# Patient Record
Sex: Male | Born: 1950
Health system: Southern US, Community
[De-identification: ages and names within clinical notes are randomized; demographics above are authoritative.]

## PROBLEM LIST (undated history)

## (undated) DIAGNOSIS — G4730 Sleep apnea, unspecified: Secondary | ICD-10-CM

## (undated) DIAGNOSIS — I10 Essential (primary) hypertension: Secondary | ICD-10-CM

## (undated) DIAGNOSIS — L409 Psoriasis, unspecified: Secondary | ICD-10-CM

## (undated) DIAGNOSIS — E785 Hyperlipidemia, unspecified: Secondary | ICD-10-CM

## (undated) DIAGNOSIS — K219 Gastro-esophageal reflux disease without esophagitis: Secondary | ICD-10-CM

## (undated) DIAGNOSIS — E119 Type 2 diabetes mellitus without complications: Secondary | ICD-10-CM

## (undated) HISTORY — DX: Gastro-esophageal reflux disease without esophagitis: K21.9

## (undated) HISTORY — DX: Sleep apnea, unspecified: G47.30

## (undated) HISTORY — DX: Hyperlipidemia, unspecified: E78.5

## (undated) HISTORY — DX: Type 2 diabetes mellitus without complications: E11.9

## (undated) HISTORY — DX: Psoriasis, unspecified: L40.9

## (undated) HISTORY — DX: Essential (primary) hypertension: I10

## (undated) HISTORY — PX: TONSILLECTOMY AND ADENOIDECTOMY: SHX28

---

## 2011-12-10 ENCOUNTER — Other Ambulatory Visit: Payer: Self-pay | Admitting: Family Medicine

## 2013-09-23 ENCOUNTER — Ambulatory Visit (INDEPENDENT_AMBULATORY_CARE_PROVIDER_SITE_OTHER): Payer: No Typology Code available for payment source | Admitting: Emergency Medicine

## 2013-09-23 VITALS — BP 138/88 | HR 76 | Temp 98.1°F | Resp 19 | Ht 64.0 in | Wt 209.8 lb

## 2013-09-23 DIAGNOSIS — J111 Influenza due to unidentified influenza virus with other respiratory manifestations: Secondary | ICD-10-CM

## 2013-09-23 MED ORDER — OSELTAMIVIR PHOSPHATE 75 MG PO CAPS: 75.0000 mg | ORAL_CAPSULE | Freq: Two times a day (BID) | ORAL | Status: DC

## 2013-09-23 MED ORDER — PSEUDOEPHEDRINE-GUAIFENESIN ER 60-600 MG PO TB12: 1.0000 | ORAL_TABLET | Freq: Two times a day (BID) | ORAL | Status: DC

## 2013-09-23 MED ORDER — HYDROCOD POLST-CHLORPHEN POLST 10-8 MG/5ML PO LQCR: 5.0000 mL | Freq: Two times a day (BID) | ORAL | Status: DC | PRN

## 2013-09-23 NOTE — Patient Instructions (Signed)

## 2013-09-23 NOTE — Progress Notes (Signed)
[facility] [address] [place] [address] [phone]  Date:  09/23/2013   Name:  Aaron [name]   DOB:  1950-11-25   MRN:  [id number]  PCP:  No PCP Per Patient    Chief Complaint: Sore Throat   History of Present Illness:  ADARSH MUNDORF is a 63 y.o. very pleasant male patient who presents with the following:  Ill since two days ago with sudden onset sore throat, myalgias, malaise and fatigue.  Has  Mucoid nasal drainage and post nasal drip.  Cough is productive scant mucoid sputum.  No wheezing or shortness of breath.  No nausea or vomiting. Has a fevered feeling but thermometer.  No improvement with over the counter medications or other home remedies. Denies other complaint or health concern today.   There are no active problems to display for this patient.   History reviewed. No pertinent past medical history.  History reviewed. No pertinent past surgical history.  History  Substance Use Topics  . Smoking status: Former Research scientist (life sciences)  . Smokeless tobacco: Never Used  . Alcohol Use: Yes     Comment: 2-3 beers a week    Family History  Problem Relation Age of Onset  . Cancer Mother   . Cancer Father     Allergies  Allergen Reactions  . Erythromycin Itching    Medication list has been reviewed and updated.  Current Outpatient Prescriptions on File Prior to Visit  Medication Sig Dispense Refill  . ketoconazole (NIZORAL) 2 % cream USE TWICE DAILY FOR JOCK ITCH  30 g  0   No current facility-administered medications on file prior to visit.    Review of Systems:  As per HPI, otherwise negative.    Physical Examination: Filed Vitals:   09/23/13 1109  BP: 138/88  Pulse: 76  Temp: 98.1 F (36.7 C)  Resp: 19   Filed Vitals:   09/23/13 1109  Height: 5\' 4"  (1.626 m)  Weight: 209 lb 12.8 oz (95.165 kg)   Body mass index is 35.99 kg/(m^2). Ideal Body Weight: Weight in (lb) to have BMI = 25: 145.3  GEN: obese, NAD, Non-toxic, A &  O x 3 HEENT: Atraumatic, Normocephalic. Neck supple. No masses, No LAD. Ears and Nose: No external deformity. CV: RRR, No M/G/R. No JVD. No thrill. No extra heart sounds. PULM: CTA B, no wheezes, crackles, rhonchi. No retractions. No resp. distress. No accessory muscle use. ABD: S, NT, ND, +BS. No rebound. No HSM. EXTR: No c/c/e NEURO Normal gait.  PSYCH: Normally interactive. Conversant. Not depressed or anxious appearing.  Calm demeanor.    Assessment and Plan: Influenza tamiflu mucinex d tussionex  Signed,  Ellison Carwin, MD

## 2013-09-24 ENCOUNTER — Other Ambulatory Visit: Payer: Self-pay | Admitting: Physician Assistant

## 2013-09-27 MED ORDER — KETOCONAZOLE 2 % EX CREA: TOPICAL_CREAM | CUTANEOUS | Status: DC

## 2013-09-27 NOTE — Telephone Encounter (Signed)
Dr Ouida Sills, you just saw pt for another acute issue. Do you want to RF this med or pt need to RTC? Originally Rxd for [employer].

## 2013-09-30 ENCOUNTER — Ambulatory Visit (INDEPENDENT_AMBULATORY_CARE_PROVIDER_SITE_OTHER): Payer: No Typology Code available for payment source | Admitting: Family Medicine

## 2013-09-30 VITALS — BP 112/76 | HR 98 | Temp 97.8°F | Resp 18 | Ht 65.0 in | Wt 206.0 lb

## 2013-09-30 DIAGNOSIS — R059 Cough, unspecified: Secondary | ICD-10-CM

## 2013-09-30 DIAGNOSIS — R05 Cough: Secondary | ICD-10-CM

## 2013-09-30 DIAGNOSIS — J209 Acute bronchitis, unspecified: Secondary | ICD-10-CM

## 2013-09-30 DIAGNOSIS — J329 Chronic sinusitis, unspecified: Secondary | ICD-10-CM

## 2013-09-30 MED ORDER — HYDROCOD POLST-CHLORPHEN POLST 10-8 MG/5ML PO LQCR: 5.0000 mL | Freq: Two times a day (BID) | ORAL | Status: DC | PRN

## 2013-09-30 MED ORDER — LEVOFLOXACIN 500 MG PO TABS
500.0000 mg | ORAL_TABLET | Freq: Every day | ORAL | Status: DC
Start: 2013-09-30 — End: 2014-01-23

## 2013-09-30 NOTE — Progress Notes (Signed)
° °  Subjective:    Patient ID: Aaron [name], male    DOB: 1951-07-30, 63 y.o.   MRN: [id number]  HPI Chief Complaint  Patient presents with   Follow-up    flu, symptoms havent went away   This chart was scribed for [name], MD by Thea Alken, ED Scribe. This patient was seen in room 1 and the patient's care was started at 6:13 PM.  HPI Comments: Aaron [name] is a 63 y.o. male who [facility] for [facility] follow up. Pt reports that he was seen last week for influenza. He states that he feels like he is not getting better. He reports he still has a productive cough with little phlegm and that he feel his symptoms have moved to his sinuses. He states that he no longer has a sore throat. He reports that he finished the tamaflu and is still taking the  cough syrup with mild relief to his symptoms. Pt denies asthma. Pt reports that he has no other medical problems. Pt reports he is allergic to erythromycin.   Pt works at [employer].  History reviewed. No pertinent past medical history. Allergies  Allergen Reactions   Erythromycin Itching   Prior to Admission medications   Not on File    Review of Systems  HENT: Negative for sore throat.   Respiratory: Positive for cough.   Gastrointestinal: Negative for nausea and vomiting.       Objective:   Physical Exam  Nursing note and vitals reviewed. Constitutional: He is oriented to person, place, and time. He appears well-developed and well-nourished. No distress.  HENT:  Head: Normocephalic and atraumatic.  Eyes: EOM are normal.  Neck: Neck supple. No tracheal deviation present.  Cardiovascular: Normal rate.   Pulmonary/Chest: Effort normal. No respiratory distress.  Musculoskeletal: Normal range of motion.  Neurological: He is alert and oriented to person, place, and time.  Skin: Skin is warm and dry.  Psychiatric: He has a normal mood and affect. His behavior is normal.     Filed Vitals:   09/30/13 1748  BP: 112/76  Pulse: 98  Temp: 97.8 F (36.6 C)  Resp: 18  ronchi bilaterally, worse on right, partial clearing with cough     Assessment & Plan:   Acute bronchitis - Plan: chlorpheniramine-HYDROcodone (TUSSIONEX PENNKINETIC ER) 10-8 MG/5ML LQCR, levofloxacin (LEVAQUIN) 500 MG tablet  Cough - Plan: chlorpheniramine-HYDROcodone (TUSSIONEX PENNKINETIC ER) 10-8 MG/5ML LQCR, levofloxacin (LEVAQUIN) 500 MG tablet  Sinusitis - Plan: levofloxacin (LEVAQUIN) 500 MG tablet  Signed, [name], MD

## 2013-09-30 NOTE — Patient Instructions (Signed)
Sinusitis  Sinusitis is redness, soreness, and swelling (inflammation) of the paranasal sinuses. Paranasal sinuses are air pockets within the bones of your face (beneath the eyes, the middle of the forehead, or above the eyes). In healthy paranasal sinuses, mucus is able to drain out, and air is able to circulate through them by way of your nose. However, when your paranasal sinuses are inflamed, mucus and air can become trapped. This can allow bacteria and other germs to grow and cause infection.  Sinusitis can develop quickly and last only a short time (acute) or continue over a long period (chronic). Sinusitis that lasts for more than 12 weeks is considered chronic.   CAUSES   Causes of sinusitis include:   Allergies.   Structural abnormalities, such as displacement of the cartilage that separates your nostrils (deviated septum), which can decrease the air flow through your nose and sinuses and affect sinus drainage.   Functional abnormalities, such as when the small hairs (cilia) that line your sinuses and help remove mucus do not work properly or are not present.  SYMPTOMS   Symptoms of acute and chronic sinusitis are the same. The primary symptoms are pain and pressure around the affected sinuses. Other symptoms include:   Upper toothache.   Earache.   Headache.   Bad breath.   Decreased sense of smell and taste.   A cough, which worsens when you are lying flat.   Fatigue.   Fever.   Thick drainage from your nose, which often is green and may contain pus (purulent).   Swelling and warmth over the affected sinuses.  DIAGNOSIS   Your caregiver will perform a physical exam. During the exam, your caregiver may:   Look in your nose for signs of abnormal growths in your nostrils (nasal polyps).   Tap over the affected sinus to check for signs of infection.   View the inside of your sinuses (endoscopy) with a special imaging device with a light attached (endoscope), which is inserted into your  sinuses.  If your caregiver suspects that you have chronic sinusitis, one or more of the following tests may be recommended:   Allergy tests.   Nasal culture A sample of mucus is taken from your nose and sent to a lab and screened for bacteria.   Nasal cytology A sample of mucus is taken from your nose and examined by your caregiver to determine if your sinusitis is related to an allergy.  TREATMENT   Most cases of acute sinusitis are related to a viral infection and will resolve on their own within 10 days. Sometimes medicines are prescribed to help relieve symptoms (pain medicine, decongestants, nasal steroid sprays, or saline sprays).   However, for sinusitis related to a bacterial infection, your caregiver will prescribe antibiotic medicines. These are medicines that will help kill the bacteria causing the infection.   Rarely, sinusitis is caused by a fungal infection. In theses cases, your caregiver will prescribe antifungal medicine.  For some cases of chronic sinusitis, surgery is needed. Generally, these are cases in which sinusitis recurs more than 3 times per year, despite other treatments.  HOME CARE INSTRUCTIONS    Drink plenty of water. Water helps thin the mucus so your sinuses can drain more easily.   Use a humidifier.   Inhale steam 3 to 4 times a day (for example, sit in the bathroom with the shower running).   Apply a warm, moist washcloth to your face 3 to 4 times a day,   or as directed by your caregiver.   Use saline nasal sprays to help moisten and clean your sinuses.   Take over-the-counter or prescription medicines for pain, discomfort, or fever only as directed by your caregiver.  SEEK IMMEDIATE MEDICAL CARE IF:   You have increasing pain or severe headaches.   You have nausea, vomiting, or drowsiness.   You have swelling around your face.   You have vision problems.   You have a stiff neck.   You have difficulty breathing.  MAKE SURE YOU:    Understand these  instructions.   Will watch your condition.   Will get help right away if you are not doing well or get worse.  Document Released: 08/17/2005 Document Revised: 11/09/2011 Document Reviewed: 09/01/2011  ExitCare Patient Information 2014 [employer]          Bronchitis  Bronchitis is inflammation of the airways that extend from the windpipe into the lungs (bronchi). The inflammation often causes mucus to develop, which leads to a cough. If the inflammation becomes severe, it may cause shortness of breath.  CAUSES   Bronchitis may be caused by:    Viral infections.    Bacteria.    Cigarette smoke.    Allergens, pollutants, and other irritants.   SIGNS AND SYMPTOMS   The most common symptom of bronchitis is a frequent cough that produces mucus. Other symptoms include:   Fever.    Body aches.    Chest congestion.    Chills.    Shortness of breath.    Sore throat.   DIAGNOSIS   Bronchitis is usually diagnosed through a medical history and physical exam. Tests, such as chest X-rays, are sometimes done to rule out other conditions.   TREATMENT   You may need to avoid contact with whatever caused the problem (smoking, for example). Medicines are sometimes needed. These may include:   Antibiotics. These may be prescribed if the condition is caused by bacteria.   Cough suppressants. These may be prescribed for relief of cough symptoms.    Inhaled medicines. These may be prescribed to help open your airways and make it easier for you to breathe.    Steroid medicines. These may be prescribed for those with recurrent (chronic) bronchitis.  HOME CARE INSTRUCTIONS   Get plenty of rest.    Drink enough fluids to keep your urine clear or pale yellow (unless you have a medical condition that requires fluid restriction). Increasing fluids may help thin your secretions and will prevent dehydration.    Only take over-the-counter or prescription medicines as directed by your health care  provider.   Only take antibiotics as directed. Make sure you finish them even if you start to feel better.   Avoid secondhand smoke, irritating chemicals, and strong fumes. These will make bronchitis worse. If you are a smoker, quit smoking. Consider using nicotine gum or skin patches to help control withdrawal symptoms. Quitting smoking will help your lungs heal faster.    Put a cool-mist humidifier in your bedroom at night to moisten the air. This may help loosen mucus. Change the water in the humidifier daily. You can also run the hot water in your shower and sit in the bathroom with the door closed for 5 10 minutes.    Follow up with your health care provider as directed.    Wash your hands frequently to avoid catching bronchitis again or spreading an infection to others.   SEEK MEDICAL CARE IF:  Your symptoms   do not improve after 1 week of treatment.   SEEK IMMEDIATE MEDICAL CARE IF:   Your fever increases.   You have chills.    You have chest pain.    You have worsening shortness of breath.    You have bloody sputum.   You faint.   You have lightheadedness.   You have a severe headache.    You vomit repeatedly.  MAKE SURE YOU:    Understand these instructions.   Will watch your condition.   Will get help right away if you are not doing well or get worse.  Document Released: 08/17/2005 Document Revised: 06/07/2013 Document Reviewed: 04/11/2013  ExitCare Patient Information 2014 [employer].

## 2013-10-22 ENCOUNTER — Other Ambulatory Visit: Payer: Self-pay | Admitting: Family Medicine

## 2013-10-22 DIAGNOSIS — R05 Cough: Secondary | ICD-10-CM

## 2013-10-22 DIAGNOSIS — J209 Acute bronchitis, unspecified: Secondary | ICD-10-CM

## 2013-10-22 DIAGNOSIS — J329 Chronic sinusitis, unspecified: Secondary | ICD-10-CM

## 2013-10-22 DIAGNOSIS — R059 Cough, unspecified: Secondary | ICD-10-CM

## 2014-01-23 ENCOUNTER — Ambulatory Visit (INDEPENDENT_AMBULATORY_CARE_PROVIDER_SITE_OTHER): Payer: No Typology Code available for payment source | Admitting: Emergency Medicine

## 2014-01-23 VITALS — BP 122/73 | HR 63 | Temp 98.2°F | Resp 18 | Ht 66.5 in | Wt 210.2 lb

## 2014-01-23 DIAGNOSIS — R300 Dysuria: Secondary | ICD-10-CM

## 2014-01-23 LAB — POCT URINALYSIS DIPSTICK
Bilirubin, UA: NEGATIVE
Glucose, UA: 100
Ketones, UA: 15
Leukocytes, UA: NEGATIVE
Nitrite, UA: NEGATIVE
Protein, UA: NEGATIVE
Spec Grav, UA: >=1.03
Urobilinogen, UA: 0.2
pH, UA: 5.5

## 2014-01-23 LAB — POCT UA - MICROSCOPIC ONLY
Bacteria, U Microscopic: NEGATIVE
Casts, Ur, LPF, POC: NEGATIVE
Crystals, Ur, HPF, POC: NEGATIVE
Mucus, UA: NEGATIVE
WBC, Ur, HPF, POC: NEGATIVE
Yeast, UA: NEGATIVE

## 2014-01-23 MED ORDER — CIPROFLOXACIN HCL 500 MG PO TABS: 500.0000 mg | ORAL_TABLET | Freq: Two times a day (BID) | ORAL | Status: DC

## 2014-01-23 NOTE — Progress Notes (Addendum)
   Subjective:    Patient ID: Aaron [name], male    DOB: 1951-03-13, 63 y.o.   MRN: [id number]  HPI 63 yo male with complaint of dysuria for one day with mild pinkish discharge as well.  Patient denies abdominal or back pain.  No fever or chills.    PPMH:  BPH  SH:  Former smoker, occasional alcohol   Review of Systems  Constitutional: Negative for fever and chills.  Gastrointestinal: Negative for nausea, vomiting, abdominal pain, diarrhea and constipation.  Genitourinary: Positive for dysuria and urgency. Negative for flank pain, discharge and penile pain.       Objective:   Physical Exam Blood pressure 122/73, pulse 63, temperature 98.2 F (36.8 C), temperature source Oral, resp. rate 18, height 5' 6.5" (1.689 m), weight 210 lb 3.2 oz (95.346 kg), SpO2 95.00%. Body mass index is 33.42 kg/(m^2). Well-developed, well nourished male who is awake, alert and oriented, in NAD. HEENT: [place]/AT, PERRL, EOMI.  Sclera and conjunctiva are clear.   Lungs: normal effort Abdomen:  supple, non-tender, no mass or organomegaly. Back:  nontender with no CVA tenderness. Extremities: no cyanosis, clubbing or edema. Skin: warm and dry without rash. Psychologic: good mood and appropriate affect, normal speech and behavior.  Results for orders placed in visit on 01/23/14  POCT URINALYSIS DIPSTICK      Result Value Ref Range   Color, UA yellow     Clarity, UA clear     Glucose, UA 100     Bilirubin, UA neg     Ketones, UA 15     Spec Grav, UA >=1.030     Blood, UA trace     pH, UA 5.5     Protein, UA neg     Urobilinogen, UA 0.2     Nitrite, UA neg     Leukocytes, UA Negative    POCT UA - MICROSCOPIC ONLY      Result Value Ref Range   WBC, Ur, HPF, POC neg     RBC, urine, microscopic 0-1     Bacteria, U Microscopic neg     Mucus, UA neg     Epithelial cells, urine per micros 0-1     Crystals, Ur, HPF, POC neg     Casts, Ur, LPF, POC neg     Yeast, UA neg          Assessment &  Plan:  Dysuria - will treat with cipro x 5 days.   Cult pending-agree with above Robert P. Laney Pastor, M.D.

## 2014-01-25 LAB — URINE CULTURE: Colony Count: 50000

## 2014-03-04 ENCOUNTER — Telehealth: Payer: Self-pay

## 2014-03-04 ENCOUNTER — Other Ambulatory Visit: Payer: Self-pay | Admitting: Emergency Medicine

## 2014-03-04 NOTE — Telephone Encounter (Signed)
Patient was seen last month for blood in urine. States that the problem has come back and wants to know if he can have another script for Ciprofloxacin. [facility] and [facility]  [phone]

## 2014-03-04 NOTE — Telephone Encounter (Signed)
Advised wife that pt should RTC. Pt's wife was agreeable. Pt will come in the morning.

## 2014-03-05 ENCOUNTER — Ambulatory Visit (INDEPENDENT_AMBULATORY_CARE_PROVIDER_SITE_OTHER): Payer: No Typology Code available for payment source | Admitting: Emergency Medicine

## 2014-03-05 VITALS — BP 132/84 | HR 79 | Temp 98.8°F | Resp 16

## 2014-03-05 DIAGNOSIS — E119 Type 2 diabetes mellitus without complications: Secondary | ICD-10-CM

## 2014-03-05 DIAGNOSIS — R319 Hematuria, unspecified: Secondary | ICD-10-CM

## 2014-03-05 LAB — COMPREHENSIVE METABOLIC PANEL
ALT: 26 U/L (ref 0–53)
AST: 17 U/L (ref 0–37)
Albumin: 4.1 g/dL (ref 3.5–5.2)
Alkaline Phosphatase: 91 U/L (ref 39–117)
BUN: 12 mg/dL (ref 6–23)
CO2: 26 mEq/L (ref 19–32)
Calcium: 9.1 mg/dL (ref 8.4–10.5)
Chloride: 101 mEq/L (ref 96–112)
Creat: 0.84 mg/dL (ref 0.50–1.35)
Glucose, Bld: 234 mg/dL — ABNORMAL HIGH (ref 70–99)
Potassium: 4.4 mEq/L (ref 3.5–5.3)
Sodium: 137 mEq/L (ref 135–145)
Total Bilirubin: 0.4 mg/dL (ref 0.2–1.2)
Total Protein: 6.9 g/dL (ref 6.0–8.3)

## 2014-03-05 LAB — POCT URINALYSIS DIPSTICK
Bilirubin, UA: NEGATIVE
Glucose, UA: NEGATIVE
Ketones, UA: NEGATIVE
Nitrite, UA: NEGATIVE
Protein, UA: NEGATIVE
Spec Grav, UA: 1.01
Urobilinogen, UA: 0.2
pH, UA: 5

## 2014-03-05 LAB — POCT UA - MICROSCOPIC ONLY
Bacteria, U Microscopic: NEGATIVE
Casts, Ur, LPF, POC: NEGATIVE
Crystals, Ur, HPF, POC: NEGATIVE
Mucus, UA: NEGATIVE
Yeast, UA: NEGATIVE

## 2014-03-05 LAB — LIPID PANEL
Cholesterol: 191 mg/dL (ref 0–200)
HDL: 37 mg/dL — ABNORMAL LOW (ref >39)
LDL Cholesterol: 117 mg/dL — ABNORMAL HIGH (ref 0–99)
Total CHOL/HDL Ratio: 5.2 Ratio
Triglycerides: 184 mg/dL — ABNORMAL HIGH (ref <150)
VLDL: 37 mg/dL (ref 0–40)

## 2014-03-05 LAB — CBC
HCT: 47.7 % (ref 39.0–52.0)
Hemoglobin: 16.5 g/dL (ref 13.0–17.0)
MCH: 27.5 pg (ref 26.0–34.0)
MCHC: 34.6 g/dL (ref 30.0–36.0)
MCV: 79.4 fL (ref 78.0–100.0)
Platelets: 235 10*3/uL (ref 150–400)
RBC: 6.01 MIL/uL — ABNORMAL HIGH (ref 4.22–5.81)
RDW: 14 % (ref 11.5–15.5)
WBC: 8.1 10*3/uL (ref 4.0–10.5)

## 2014-03-05 LAB — GLUCOSE, POCT (MANUAL RESULT ENTRY): POC Glucose: 242 mg/dl — AB (ref 70–99)

## 2014-03-05 LAB — POCT GLYCOSYLATED HEMOGLOBIN (HGB A1C): Hemoglobin A1C: 8.2

## 2014-03-05 MED ORDER — GLUCOSE BLOOD VI STRP: ORAL_STRIP | Status: DC

## 2014-03-05 MED ORDER — LISINOPRIL 10 MG PO TABS: 10.0000 mg | ORAL_TABLET | Freq: Every day | ORAL | Status: DC

## 2014-03-05 MED ORDER — METFORMIN HCL ER 500 MG PO TB24: ORAL_TABLET | ORAL | Status: DC

## 2014-03-05 MED ORDER — FREESTYLE SYSTEM KIT: 1.0000 | PACK | Status: DC | PRN

## 2014-03-05 NOTE — Patient Instructions (Signed)
Diabetes and Small Vessel Disease Small vessel disease (microvascular disease) includes nephropathy, retinopathy, and neuropathy. People with diabetes are at risk for these problems, but keeping blood glucose (sugar) controlled is helpful in preventing problems. DIABETIC KIDNEY PROBLEMS (DIABETIC NEPHROPATHY)  Diabetic nephropathy occurs in many patients with diabetes.  Damage to the small vessels in the kidneys is the leading cause of end-stage renal disease (ESRD).  Protein in the urine (albuminuria) in the range of 30 to 300 mg/24 h (microalbuminuria) is a sign of the earliest stage of diabetic nephropathy.  Good blood glucose (sugar) and blood pressure control significantly reduce the progression of nephropathy. DIABETIC EYE PROBLEMS (DIABETIC RETINOPATHY)  Diabetic retinopathy is the most common cause of new cases of blindness in adults. It is related to the number of years you have had diabetes.  Common risk factors include high blood sugar (hyperglycemia), high blood pressure (hypertension), and poorly controlled blood lipids such as high blood cholesterol (hypercholesterolemia). DIABETIC NERVE PROBLEMS (DIABETIC NEUROPATHY) Diabetic neuropathy is the most common, long-term complication of diabetes. It is responsible for more than half of leg amputations not due to accidents. The main risk for developing diabetic neuropathy seems to be uncontrolled blood sugars. Hyperglycemia damages the nerve fibers causing sensation (feeling) problems. The closer you can keep the following guidelines, the better chance you will have avoiding problems from small vessel disease.  Working toward near normal blood glucose or as normal as possible. You will need to keep your blood glucose and A1c at the target range prescribed by your caregiver.  Keep your blood pressure less than 120/80.  Keep your low-density lipoprotein (LDL) cholesterol (one of the fats in your blood) at less than 100 mg/dL. An LDL  less than 70 mg/dL may be recommended for high risk patients. You cannot change your family history, but it is important to change the risk factors that you can. Risk factors you can control include:  Controlling high blood pressure.  Stopping smoking.  Using alcohol only in moderation. Generally, this means about one drink per day for women and two drinks per day for men.  Controlling your blood lipids (cholesterol and triglycerides).  Treating heart problems, if these are contributing to risk. SEEK MEDICAL CARE IF:   You are having problems keeping your blood glucose in goal range.  You notice a change in your vision or new problems with your vision.  You have wound or sore that does not heal.  Your blood pressure is above the target range. Document Released: 08/20/2003 Document Revised: 08/03/2012 Document Reviewed: 01/25/2009 [facility] Patient Information 2015 [place], [place]. This information is not intended to replace advice given to you by your health care provider. Make sure you discuss any questions you have with your health care provider. How to Increase Fiber in the Meal Plan for Diabetes Increasing fiber in the diet has many benefits including lowering blood cholesterol, helping to control blood glucose (sugar), preventing constipation, and aiding in weight management by helping you feel full longer. Start adding fiber to your diet slowly. A gradual substitution of high-fiber foods for low-fiber foods will allow the digestive tract to adjust. Most men under 90 years of age should aim to eat 38 g of fiber a day. Women should aim for 25 g. Over 54 years of age, most men need 30 g of fiber and most women need 21 g. Below are some suggestions for increasing fiber.  Try whole-wheat bread instead of white bread. Look for words high on the list  of ingredients, such as whole wheat, whole rye, or whole oats.  Try a baked potato with skin instead of mashed potatoes.  Try a fresh apple  with skin instead of applesauce.  Try a fresh orange instead of orange juice.  Try popcorn instead of potato chips.  Try bran cereal instead of corn flakes.  Try kidney, whole pinto, or garbanzo beans instead of bread.  Try whole-grain crackers instead of saltine crackers.  Try whole-wheat pasta instead of regular varieties. While on a high-fiber diet:   Drink enough water and fluids to keep your urine clear or pale yellow.  Eat a variety of high fiber foods such as fruits, vegetables, whole grains, nuts, and seeds.  Aim for 5 servings of fruit and vegetables per day.  Try to increase your intake of fiber by eating high-fiber foods instead of taking fiber supplements that contain small amounts of fiber. There can be additional benefits for long-term health and blood glucose control with high-fiber foods.  SOURCES OF FIBER The following list shows the average dietary fiber for types of food in the various food groups. Starches and Breads / Dietary Fiber (g)  Whole-grain bread, 1 slice / 2 g  Whole-grain cereals,  cup / 3 g  Bran cereals,  to  cup / 8 g  Starchy vegetables,  cup / 3 g  Oatmeal,  cup / 2 g  Whole-wheat pasta,  cup / 2 g  Brown rice,  cup / 2 g  Barley,  cup / 3 g Legumes / Dietary Fiber (g)  Beans,  cup / 8 g  Peas,  cup / 8 g  Lentils ,  cup / 8 g Meat and Meat Substitutes / Dietary Fiber (g) This group averages 0 grams of fiber. Exceptions are:  Nuts, seeds, 1 oz or  cup / 3 g  Chunky peanut butter, 2 tbs / 3 g Vegetables / Dietary Fiber (g)  Cooked vegetables,  to  cup / 2 to 3 g  Raw vegetables, 1 to 2 cups / 2 to 3 g Fruit / Dietary Fiber (g)  Raw or cooked fruit,  cup or 1 small, fresh piece / 2 g Milk / Dietary Fiber (g)  Milk, 1 cup or 8 oz / 0 g Fats and Oils / Dietary Fiber (g)  Fats and oils, 1 tsp / 0 g You can determine how much fiber you are eating by reading the Nutrition Facts panel on the labels of the  foods you eat. FIBER IN SPECIFIC FOODS Cereals / Dietary Fiber (g)  All Bran,  cup / 9 g  All Bran with Extra Fiber,  cup / 13 g  Bran Flakes,  cup / 4 g  Cheerios,  cup / 1.5 g  Corn Bran,  cup / 4 g  Corn Flakes,  cup / 0.75 g  Cracklin' Oat Bran,  cup / 4 g  Fiber One,  cup / 13 g  Grape Nuts, 3 tbs / 3 g  Grape Nuts Flakes,  cup / 3 g  Noodles,  cup, cooked / 0.5 g  Nutrigrain Wheat,  cup / 3.5 g  Oatmeal,  cup, cooked / 1.1 g  Pasta, white (macaroni, spaghetti),  cup, cooked / 0.5 g  Pasta, whole-wheat (macaroni, spaghetti),  cup, cooked / 2 g  [place],  cup, cooked / 3 g  Rice, wild,  cup, cooked / 0.5 g  Rice, brown,  cup, cooked / 1 g  Rice, white,  cup,  cooked / 0.2 g  Shredded Wheat, bite-sized,  cup / 2 g  Total,  cup / 1.75 g  Wheat Chex,  cup / 2.5 g  [place]heatena,  cup, cooked / 4 g  Wheaties,  cup / 2.75 g Bread, Starchy Vegetables, and Dried Peas and Beans / Dietary Fiber (g)  Bagel, whole / 0.6 g  Baked beans in tomato sauce,  cup, cooked / 3 g  Bran muffin, 1 small / 2.5 g  Bread, cracked wheat, 1 slice / 2.5 g  Bread, pumpernickel, 1 slice / 2.5 g  Bread, white, 1 slice / 0.4 g  Bread, whole-wheat, 1 slice / 1.4 g  Corn,  cup, canned / 2.9 g  Kidney beans,  cup, cooked / 3.5 g  Lentils, cup, cooked / 3 g  Lima beans,  cup, cooked / 4 g  Navy beans,  cup, cooked / 4 g  Peas,  cup, cooked / 4 g  Popcorn, 3 cups popped, unbuttered / 3.5 g  Potato, baked (with skin), 1 small / 4 g  Potato, baked (without skin), 1 small / 2 g  Ry-Krisp, 4 crackers / 3 g  Saltine crackers, 6 squares / 0 g  Split peas,  cup, cooked / 2.5 g  Yams (sweet potato),  cup / 1.7 g Fruit / Dietary Fiber (g)  Apple, 1 small, fresh, with skin / 4 g  Apple juice,  cup / 0.4 g  Apricots, 4 medium, fresh / 4 g  Apricots, 7 halves, dried / 2 g  Banana,  medium / 1.2 g  Blueberries,  cup / 2  g  Cantaloupe, melon / 1.3 g  Cherries,  cup, canned / 1.4 g  Grapefruit,  medium / 1.6 g  Grapes, 15 small / 1.2 g  Grape juice,  cup / 0.5 g  Orange, 1 medium, fresh / 2 g  Orange juice,  cup / 0.5 g  Peach, 1 medium,fresh, with skin / 2 g  Pear, 1 medium, fresh, with skin / 4 g  Pineapple, cup, canned / 0.7 g  Plums, 2 whole / 2 g  Prunes, 3 whole / 1.5 g  Raspberries, 1 cup / 6 g  Strawberries, 1  cup / 4 g  Watermelon, 1  cup / 0.5 g Vegetables / Dietary Fiber (g)  Asparagus,  cup, cooked / 1 g  Beans, green and wax,  cup, cooked / 1.6 g  Beets,  cup, cooked / 1.8 g  Broccoli,  cup, cooked / 2.2 g  Brussels sprouts,  cup, cooked / 4 g  Cabbage,  cup, cooked / 2.5 g  Carrots,  cup, cooked / 2.3 g  Cauliflower,  cup, cooked / 1.1 g  Celery, 1 cup, raw / 1.5 g  Cucumber, 1 cup, raw / 0.8 g  Green pepper,  cup sliced, cooked / 1.5 g  Lettuce, 1 cup, sliced / 0.9 g  Mushrooms, 1 cup sliced, raw / 1.8 g  Onion, 1 cup sliced, raw / 1.6 g  Spinach,  cup, cooked / 2.4 g  Tomato, 1 medium, fresh / 1.5 g  Tomato juice,  cup / 0 g  Zucchini,  cup, cooked / 1.8 g Document Released: 02/06/2002 Document Revised: 12/12/2012 Document Reviewed: 03/05/2009 [employer] Patient Information 2015 [place], [place]. This information is not intended to replace advice given to you by your health care provider. Make sure you discuss any questions you have with your health care provider. Type 2 Diabetes Mellitus,  Adult Type 2 diabetes mellitus, often simply referred to as type 2 diabetes, is a long-lasting (chronic) disease. In type 2 diabetes, the pancreas does not make enough insulin (a hormone), the cells are less responsive to the insulin that is made (insulin resistance), or both. Normally, insulin moves sugars from food into the tissue cells. The tissue cells use the sugars for energy. The lack of insulin or the lack of normal response to insulin  causes excess sugars to build up in the blood instead of going into the tissue cells. As a result, high blood sugar (hyperglycemia) develops. The effect of high sugar (glucose) levels can cause many complications. Type 2 diabetes was also previously called adult-onset diabetes but it can occur at any age.  RISK FACTORS  A person is predisposed to developing type 2 diabetes if someone in the family has the disease and also has one or more of the following primary risk factors:  Overweight.  An inactive lifestyle.  A history of consistently eating high-calorie foods. Maintaining a normal weight and regular physical activity can reduce the chance of developing type 2 diabetes. SYMPTOMS  A person with type 2 diabetes may not show symptoms initially. The symptoms of type 2 diabetes appear slowly. The symptoms include:  Increased thirst (polydipsia).  Increased urination (polyuria).  Increased urination during the night (nocturia).  Weight loss. This weight loss may be rapid.  Frequent, recurring infections.  Tiredness (fatigue).  Weakness.  Vision changes, such as blurred vision.  Fruity smell to your breath.  Abdominal pain.  Nausea or vomiting.  Cuts or bruises which are slow to heal.  Tingling or numbness in the hands or feet. DIAGNOSIS Type 2 diabetes is frequently not diagnosed until complications of diabetes are present. Type 2 diabetes is diagnosed when symptoms or complications are present and when blood glucose levels are increased. Your blood glucose level may be checked by one or more of the following blood tests:  A fasting blood glucose test. You will not be allowed to eat for at least 8 hours before a blood sample is taken.  A random blood glucose test. Your blood glucose is checked at any time of the day regardless of when you ate.  A hemoglobin A1c blood glucose test. A hemoglobin A1c test provides information about blood glucose control over the previous 3  months.  An oral glucose tolerance test (OGTT). Your blood glucose is measured after you have not eaten (fasted) for 2 hours and then after you drink a glucose-containing beverage. TREATMENT   You may need to take insulin or diabetes medicine daily to keep blood glucose levels in the desired range.  If you use insulin, you may need to adjust the dosage depending on the carbohydrates that you eat with each meal or snack. The treatment goal is to maintain the before meal blood sugar (preprandial glucose) level at 70-130 mg/dL. HOME CARE INSTRUCTIONS   Have your hemoglobin A1c level checked twice a year.  Perform daily blood glucose monitoring as directed by your health care provider.  Monitor urine ketones when you are ill and as directed by your health care provider.  Take your diabetes medicine or insulin as directed by your health care provider to maintain your blood glucose levels in the desired range.  Never run out of diabetes medicine or insulin. It is needed every day.  If you are using insulin, you may need to adjust the amount of insulin given based on your intake of carbohydrates. Carbohydrates  can raise blood glucose levels but need to be included in your diet. Carbohydrates provide vitamins, minerals, and fiber which are an essential part of a healthy diet. Carbohydrates are found in fruits, vegetables, whole grains, dairy products, legumes, and foods containing added sugars.  Eat healthy foods. You should make an appointment to see a registered dietitian to help you create an eating plan that is right for you.  Lose weight if overweight.  Carry a medical alert card or wear your medical alert jewelry.  Carry a 15 gram carbohydrate snack with you at all times to treat low blood glucose (hypoglycemia). Some examples of 15 gram carbohydrate snacks include:  Glucose tablets, 3 or 4  Raisins, 2 tablespoons (24 grams)  Jelly beans, 6  Animal crackers, 8  Regular pop, 4  ounces (120 mL)  Gummy treats, 9  Recognize hypoglycemia. Hypoglycemia occurs with blood glucose levels of 70 mg/dL and below. The risk for hypoglycemia increases when fasting or skipping meals, during or after intense exercise, and during sleep. Hypoglycemia symptoms can include:  Tremors or shakes.  Decreased ability to concentrate.  Sweating.  Increased heart rate.  Headache.  Dry mouth.  Hunger.  Irritability.  Anxiety.  Restless sleep.  Altered speech or coordination.  Confusion.  Treat hypoglycemia promptly. If you are alert and able to safely swallow, follow the 15:15 rule:  Take 15-20 grams of rapid-acting glucose or carbohydrate. Rapid-acting options include glucose gel, glucose tablets, or 4 ounces (120 mL) of fruit juice, regular soda, or low fat milk.  Check your blood glucose level 15 minutes after taking the glucose.  Take 15-20 grams more of glucose if the repeat blood glucose level is still 70 mg/dL or below.  Eat a meal or snack within 1 hour once blood glucose levels return to normal.  Be alert to feeling very thirsty and urinating more frequently than usual, which are early signs of hyperglycemia. An early awareness of hyperglycemia allows for prompt treatment. Treat hyperglycemia as directed by your health care provider.  Engage in at least 150 minutes of moderate-intensity physical activity a week, spread over at least 3 days of the week or as directed by your health care provider. In addition, you should engage in resistance exercise at least 2 times a week or as directed by your health care provider.  Adjust your medicine and food intake as needed if you start a new exercise or sport.  Follow your sick day plan at any time you are unable to eat or drink as usual.  Avoid tobacco use.  Limit alcohol intake to no more than 1 drink per day for nonpregnant women and 2 drinks per day for men. You should drink alcohol only when you are also eating  food. Talk with your health care provider whether alcohol is safe for you. Tell your health care provider if you drink alcohol several times a week.  Follow up with your health care provider regularly.  Schedule an eye exam soon after the diagnosis of type 2 diabetes and then annually.  Perform daily skin and foot care. Examine your skin and feet daily for cuts, bruises, redness, nail problems, bleeding, blisters, or sores. A foot exam by a health care provider should be done annually.  Brush your teeth and gums at least twice a day and floss at least once a day. Follow up with your dentist regularly.  Share your diabetes management plan with your workplace or school.  Stay up-to-date with immunizations.  Learn  to manage stress.  Obtain ongoing diabetes education and support as needed.  Participate in, or seek rehabilitation as needed to maintain or improve independence and quality of life. Request a physical or occupational therapy referral if you are having foot or hand numbness or difficulties with grooming, dressing, eating, or physical activity. SEEK MEDICAL CARE IF:   You are unable to eat food or drink fluids for more than 6 hours.  You have nausea and vomiting for more than 6 hours.  Your blood glucose level is over 240 mg/dL.  There is a change in mental status.  You develop an additional serious illness.  You have diarrhea for more than 6 hours.  You have been sick or have had a fever for a couple of days and are not getting better.  You have pain during any physical activity.  SEEK IMMEDIATE MEDICAL CARE IF:  You have difficulty breathing.  You have moderate to large ketone levels. MAKE SURE YOU:  Understand these instructions.  Will watch your condition.  Will get help right away if you are not doing well or get worse. Document Released: 08/17/2005 Document Revised: 08/22/2013 Document Reviewed: 03/15/2012 [facility] Patient Information 2015 [place],  [place]. This information is not intended to replace advice given to you by your health care provider. Make sure you discuss any questions you have with your health care provider.

## 2014-03-05 NOTE — Progress Notes (Signed)
[facility] [address] [place] [address] [phone]  Date:  03/05/2014   Name:  Aaron [name]   DOB:  Feb 09, 1951   MRN:  [id number]  PCP:  No PCP Per Patient    Chief Complaint: Medication Refill for a follow up   History of Present Illness:  Aaron [name] is a 63 y.o. very pleasant male patient who presents with the following:  Seen last month for dysuria and hematuria.  Treated with cipro but culture came back negative.  He again has dysuria and hematuria.  Denies frequency or urgency.  No nausea  Or vomiting.  No stool change, no fever or chills.  No discharge.  No improvement with over the counter medications or other home remedies.      There are no active problems to display for this patient.   History reviewed. No pertinent past medical history.  History reviewed. No pertinent past surgical history.  History  Substance Use Topics  . Smoking status: Former Research scientist (life sciences)  . Smokeless tobacco: Never Used  . Alcohol Use: Yes     Comment: 2-3 beers a week    Family History  Problem Relation Age of Onset  . Cancer Mother   . Cancer Father     Allergies  Allergen Reactions  . Erythromycin Itching    Medication list has been reviewed and updated.  Current Outpatient Prescriptions on File Prior to Visit  Medication Sig Dispense Refill  . ciprofloxacin (CIPRO) 500 MG tablet Take 1 tablet (500 mg total) by mouth 2 (two) times daily.  10 tablet  0   No current facility-administered medications on file prior to visit.    Review of Systems:  As per HPI, otherwise negative.    Physical Examination: Filed Vitals:   03/05/14 0843  BP: 132/84  Pulse: 79  Temp: 98.8 F (37.1 C)  Resp: 16   There were no vitals filed for this visit. There is no weight on file to calculate BMI. Ideal Body Weight:    GEN: WDWN, NAD, Non-toxic, A & O x 3 HEENT: Atraumatic, Normocephalic. Neck supple. No masses, No LAD. Ears and Nose: No external  deformity. CV: RRR, No M/G/R. No JVD. No thrill. No extra heart sounds. PULM: CTA B, no wheezes, crackles, rhonchi. No retractions. No resp. distress. No accessory muscle use. ABD: S, NT, ND, +BS. No rebound. No HSM. EXTR: No c/c/e NEURO Normal gait.  PSYCH: Normally interactive. Conversant. Not depressed or anxious appearing.  Calm demeanor.    Assessment and Plan: New onset NIDDM Metformin Follow up in one month  Signed,  Ellison Carwin, MD   Results for orders placed in visit on 03/05/14  POCT URINALYSIS DIPSTICK      Result Value Ref Range   Color, UA yellow     Clarity, UA clear     Glucose, UA neg     Bilirubin, UA neg     Ketones, UA neg     Spec Grav, UA 1.010     Blood, UA trace-intact     pH, UA 5.0     Protein, UA neg     Urobilinogen, UA 0.2     Nitrite, UA neg     Leukocytes, UA Trace    POCT UA - MICROSCOPIC ONLY      Result Value Ref Range   WBC, Ur, HPF, POC 0-1     RBC, urine, microscopic 0-1     Bacteria, U Microscopic neg  Mucus, UA neg     Epithelial cells, urine per micros 0-3     Crystals, Ur, HPF, POC neg     Casts, Ur, LPF, POC neg     Yeast, UA neg    POCT GLYCOSYLATED HEMOGLOBIN (HGB A1C)      Result Value Ref Range   Hemoglobin A1C 8.2    GLUCOSE, POCT (MANUAL RESULT ENTRY)      Result Value Ref Range   POC Glucose 242 (*) 70 - 99 mg/dl

## 2014-03-06 LAB — PSA: PSA: 1.75 ng/mL (ref <=4.00)

## 2014-03-06 LAB — GC/CHLAMYDIA PROBE AMP
CT Probe RNA: NEGATIVE
GC Probe RNA: NEGATIVE

## 2014-03-06 NOTE — Progress Notes (Signed)
Spoke with patients wife and she is going to have her husband call to schedule an appointment with Dr. Marin Comment on august 7 or august 14

## 2014-04-06 ENCOUNTER — Ambulatory Visit (INDEPENDENT_AMBULATORY_CARE_PROVIDER_SITE_OTHER): Payer: No Typology Code available for payment source | Admitting: Family Medicine

## 2014-04-06 ENCOUNTER — Encounter: Payer: Self-pay | Admitting: Family Medicine

## 2014-04-06 VITALS — BP 118/76 | HR 76 | Temp 98.2°F | Resp 16 | Ht 65.0 in | Wt 198.6 lb

## 2014-04-06 DIAGNOSIS — Z23 Encounter for immunization: Secondary | ICD-10-CM

## 2014-04-06 DIAGNOSIS — Z79899 Other long term (current) drug therapy: Secondary | ICD-10-CM

## 2014-04-06 DIAGNOSIS — E785 Hyperlipidemia, unspecified: Secondary | ICD-10-CM

## 2014-04-06 DIAGNOSIS — E119 Type 2 diabetes mellitus without complications: Secondary | ICD-10-CM

## 2014-04-06 DIAGNOSIS — L409 Psoriasis, unspecified: Secondary | ICD-10-CM

## 2014-04-06 DIAGNOSIS — L408 Other psoriasis: Secondary | ICD-10-CM

## 2014-04-06 LAB — MICROALBUMIN / CREATININE URINE RATIO
Creatinine, Urine: 181.1 mg/dL
Microalb Creat Ratio: 4.9 mg/g (ref 0.0–30.0)
Microalb, Ur: 0.88 mg/dL (ref 0.00–1.89)

## 2014-04-06 LAB — GLUCOSE, POCT (MANUAL RESULT ENTRY): POC Glucose: 126 mg/dl — AB (ref 70–99)

## 2014-04-06 LAB — BASIC METABOLIC PANEL
BUN: 13 mg/dL (ref 6–23)
CO2: 24 mEq/L (ref 19–32)
Calcium: 9.6 mg/dL (ref 8.4–10.5)
Chloride: 104 mEq/L (ref 96–112)
Creat: 0.83 mg/dL (ref 0.50–1.35)
Glucose, Bld: 107 mg/dL — ABNORMAL HIGH (ref 70–99)
Potassium: 4.7 mEq/L (ref 3.5–5.3)
Sodium: 139 mEq/L (ref 135–145)

## 2014-04-06 MED ORDER — FLUOCINONIDE 0.1 % EX CREA: 1.0000 "application " | TOPICAL_CREAM | Freq: Four times a day (QID) | CUTANEOUS | Status: DC | PRN

## 2014-04-06 MED ORDER — ZOSTER VACCINE LIVE 19400 UNT/0.65ML ~~LOC~~ SOLR: 0.6500 mL | Freq: Once | SUBCUTANEOUS | Status: DC

## 2014-04-06 NOTE — Progress Notes (Signed)
Subjective:  This chart was scribed for Delman Cheadle, MD by Thea Alken, ED Scribe. This patient was seen in room 25 and the patient's care was started at 9:53 AM.   Patient ID: Aaron [name], male    DOB: 1950-09-13, 63 y.o.   MRN: [id number]  HPI Chief Complaint  Patient presents with  . Follow-up    follow up on sugar levels      HPI Comments: Aaron [name] is a 63 y.o. male who presents to the [facility] her for a follow up regarding DM. Pt was newly diagnosed with DM at office visit 1 month ago after experiencing urination issues.  At that time A1C 8.2 and glucose 242 . Pt was started on metformin HR with supper x 1 week, 1000 a second week, 1500 3rd week and 2000 mg with supper beyond that. Pt was also started on lisinopril 48m daily and sent for glucometer.  Lipid panels showed LBL 117 and non HDL of 154.  Today, pt states he has been doing well since last visit. Pt has lost a tremendous amount of weight since last visit. He has been checking his blood monitor, recording and charting his results. When pt first started charting result he was in 170-190 range in the morning. Most recently his sugars have been 90-100 in the morning and 100-120 2 hours after supper. Pt has been taking medication and changed his diet. Pt reports increased foaming with urine since taking medication. Pt denies cough with lisinopril. Pt denies nausea and diarrhea with metformin.  Pt reports seeing a dermatologist in the past for intermittent psoriasis. Pt reports he was prescribed taclonex when flaring up. Pt reports he has about 1/2 a tube of this medication and is requesting a refill or an inexpensive generic brand.   Pt reports last colonoscopy was 10 years ago and is due. Pt reports last TDAP was April 2007. Pt does not think he has had a pneumonia vaccination. Pt denies receiving a shingle vaccination.     No past medical history on file. No past surgical history on file. Prior  to Admission medications   Medication Sig Start Date End Date Taking? Authorizing Provider  calcipotriene-betamethasone (TACLONEX) ointment Apply topically as needed.   Yes Historical Provider, MD  glucose blood test strip Use as instructed 03/05/14  Yes JEllison Carwin MD  glucose monitoring kit (FREESTYLE) monitoring kit 1 each by Does not apply route as needed for other. 03/05/14  Yes JEllison Carwin MD  lisinopril (PRINIVIL,ZESTRIL) 10 MG tablet Take 1 tablet (10 mg total) by mouth daily. 03/05/14  Yes JEllison Carwin MD  metFORMIN (GLUCOPHAGE XR) 500 MG 24 hr tablet 1 tab with evening meal for 1 week, 2 second week, 3 third week, 4 fourth and beyond 4 03/05/14  Yes JEllison Carwin MD   Review of Systems  Respiratory: Negative for cough.   Gastrointestinal: Negative for nausea, vomiting and diarrhea.  Genitourinary: Negative for dysuria, frequency and difficulty urinating.      BP 118/76  Pulse 76  Temp(Src) 98.2 F (36.8 C) (Oral)  Resp 16  Ht '5\' 5"'  (1.651 m)  Wt 198 lb 9.6 oz (90.084 kg)  BMI 33.05 kg/m2  SpO2 96% Objective:   Physical Exam  Nursing note and vitals reviewed. Constitutional: He is oriented to person, place, and time. He appears well-developed and well-nourished. No distress.  HENT:  Head: Normocephalic and atraumatic.  Eyes: Conjunctivae and EOM are normal.  Neck: Neck  supple.  Cardiovascular: Normal rate, regular rhythm and normal heart sounds.  Exam reveals no gallop and no friction rub.   No murmur heard. Pulmonary/Chest: Effort normal and breath sounds normal. No respiratory distress. He has no wheezes. He has no rales. He exhibits no tenderness.  Musculoskeletal: Normal range of motion.  Neurological: He is alert and oriented to person, place, and time.  Skin: Skin is warm and dry.  Psychiatric: He has a normal mood and affect. His behavior is normal.   Assessment & Plan:   11:14 AM-Discussed treatment plan which includes recheck in 2 month and if  weight lost continue pt will be taken off metformin. Pt lipid panel is to be check at 2 month follow up and if LDL has not reached 100 pt will be started on statin. Pt last colonoscopy was in January 2009, repeat in 10 years. Type II or unspecified type diabetes mellitus without mention of complication, not stated as uncontrolled - Plan: POCT glucose (manual entry), Microalbumin/Creatinine Ratio, Urine, Ambulatory referral to diabetic education, [facility] DIABETES FOOT EXAM  Encounter for long-term (current) use of other medications - Plan: Basic metabolic panel  Other and unspecified hyperlipidemia  Need for prophylactic vaccination against Streptococcus pneumoniae (pneumococcus)  Psoriasis  Meds ordered this encounter  Medications  . DISCONTD: calcipotriene-betamethasone (TACLONEX) ointment    Sig: Apply topically as needed.  . zoster vaccine live, PF, (ZOSTAVAX) 50722 UNT/0.65ML injection    Sig: Inject 19,400 Units into the skin once.    Dispense:  1 vial    Refill:  0  . Fluocinonide 0.1 % CREA    Sig: Apply 1 application topically 4 (four) times daily as needed (rash).    Dispense:  120 g    Refill:  0    I personally performed the services described in this documentation, which was scribed in my presence. The recorded information has been reviewed and considered, and addended by me as needed.  Delman Cheadle, MD MPH  Results for orders placed in visit on 57/50/51  BASIC METABOLIC PANEL      Result Value Ref Range   Sodium 139  135 - 145 mEq/L   Potassium 4.7  3.5 - 5.3 mEq/L   Chloride 104  96 - 112 mEq/L   CO2 24  19 - 32 mEq/L   Glucose, Bld 107 (*) 70 - 99 mg/dL   BUN 13  6 - 23 mg/dL   Creat 0.83  0.50 - 1.35 mg/dL   Calcium 9.6  8.4 - 10.5 mg/dL  MICROALBUMIN / CREATININE URINE RATIO      Result Value Ref Range   Microalb, Ur 0.88  0.00 - 1.89 mg/dL   Creatinine, Urine 181.1     Microalb Creat Ratio 4.9  0.0 - 30.0 mg/g  GLUCOSE, POCT (MANUAL RESULT ENTRY)      Result  Value Ref Range   POC Glucose 126 (*) 70 - 99 mg/dl

## 2014-04-06 NOTE — Patient Instructions (Signed)
Diabetes and Standards of Medical Care Diabetes is complicated. You may find that your diabetes team includes a dietitian, nurse, diabetes educator, eye doctor, and more. To help everyone know what is going on and to help you get the care you deserve, the following schedule of care was developed to help keep you on track. Below are the tests, exams, vaccines, medicines, education, and plans you will need. HbA1c test This test shows how well you have controlled your glucose over the past 2-3 months. It is used to see if your diabetes management plan needs to be adjusted.   It is performed at least 2 times a year if you are meeting treatment goals.  It is performed 4 times a year if therapy has changed or if you are not meeting treatment goals. Blood pressure test  This test is performed at every routine medical visit. The goal is less than 140/90 mm Hg for most people, but 130/80 mm Hg in some cases. Ask your health care provider about your goal. Dental exam  Follow up with the dentist regularly. Eye exam  If you are diagnosed with type 1 diabetes as a child, get an exam upon reaching the age of 37 years or older and have had diabetes for 3-5 years. Yearly eye exams are recommended after that initial eye exam.  If you are diagnosed with type 1 diabetes as an adult, get an exam within 5 years of diagnosis and then yearly.  If you are diagnosed with type 2 diabetes, get an exam as soon as possible after the diagnosis and then yearly. Foot care exam  Visual foot exams are performed at every routine medical visit. The exams check for cuts, injuries, or other problems with the feet.  A comprehensive foot exam should be done yearly. This includes visual inspection as well as assessing foot pulses and testing for loss of sensation.  Check your feet nightly for cuts, injuries, or other problems with your feet. Tell your health care provider if anything is not healing. Kidney function test (urine  microalbumin)  This test is performed once a year.  Type 1 diabetes: The first test is performed 5 years after diagnosis.  Type 2 diabetes: The first test is performed at the time of diagnosis.  A serum creatinine and estimated glomerular filtration rate (eGFR) test is done once a year to assess the level of chronic kidney disease (CKD), if present. Lipid profile (cholesterol, HDL, LDL, triglycerides)  Performed every 5 years for most people.  The goal for LDL is less than 100 mg/dL. If you are at high risk, the goal is less than 70 mg/dL.  The goal for HDL is 40 mg/dL-50 mg/dL for men and 50 mg/dL-60 mg/dL for women. An HDL cholesterol of 60 mg/dL or higher gives some protection against heart disease.  The goal for triglycerides is less than 150 mg/dL. Influenza vaccine, pneumococcal vaccine, and hepatitis B vaccine  The influenza vaccine is recommended yearly.  It is recommended that people with diabetes who are over 24 years old get the pneumonia vaccine. In some cases, two separate shots may be given. Ask your health care provider if your pneumonia vaccination is up to date.  The hepatitis B vaccine is also recommended for adults with diabetes. Diabetes self-management education  Education is recommended at diagnosis and ongoing as needed. Treatment plan  Your treatment plan is reviewed at every medical visit. Document Released: 06/14/2009 Document Revised: 01/01/2014 Document Reviewed: 01/17/2013 [facility] Patient Information 2015 [place],  [employer]. This information is not intended to replace advice given to you by your health care provider. Make sure you discuss any questions you have with your health care provider. Diabetes Mellitus and Food It is important for you to manage your blood sugar (glucose) level. Your blood glucose level can be greatly affected by what you eat. Eating healthier foods in the appropriate amounts throughout the day at about the same time each day will  help you control your blood glucose level. It can also help slow or prevent worsening of your diabetes mellitus. Healthy eating may even help you improve the level of your blood pressure and reach or maintain a healthy weight.  HOW CAN FOOD AFFECT ME? Carbohydrates Carbohydrates affect your blood glucose level more than any other type of food. Your dietitian will help you determine how many carbohydrates to eat at each meal and teach you how to count carbohydrates. Counting carbohydrates is important to keep your blood glucose at a healthy level, especially if you are using insulin or taking certain medicines for diabetes mellitus. Alcohol Alcohol can cause sudden decreases in blood glucose (hypoglycemia), especially if you use insulin or take certain medicines for diabetes mellitus. Hypoglycemia can be a life-threatening condition. Symptoms of hypoglycemia (sleepiness, dizziness, and disorientation) are similar to symptoms of having too much alcohol.  If your health care provider has given you approval to drink alcohol, do so in moderation and use the following guidelines:  Women should not have more than one drink per day, and men should not have more than two drinks per day. One drink is equal to:  12 oz of beer.  5 oz of wine.  1 oz of hard liquor.  Do not drink on an empty stomach.  Keep yourself hydrated. Have water, diet soda, or unsweetened iced tea.  Regular soda, juice, and other mixers might contain a lot of carbohydrates and should be counted. WHAT FOODS ARE NOT RECOMMENDED? As you make food choices, it is important to remember that all foods are not the same. Some foods have fewer nutrients per serving than other foods, even though they might have the same number of calories or carbohydrates. It is difficult to get your body what it needs when you eat foods with fewer nutrients. Examples of foods that you should avoid that are high in calories and carbohydrates but low in  nutrients include:  Trans fats (most processed foods list trans fats on the Nutrition Facts label).  Regular soda.  Juice.  Candy.  Sweets, such as cake, pie, doughnuts, and cookies.  Fried foods. WHAT FOODS CAN I EAT? Have nutrient-rich foods, which will nourish your body and keep you healthy. The food you should eat also will depend on several factors, including:  The calories you need.  The medicines you take.  Your weight.  Your blood glucose level.  Your blood pressure level.  Your cholesterol level. You also should eat a variety of foods, including:  Protein, such as meat, poultry, fish, tofu, nuts, and seeds (lean animal proteins are best).  Fruits.  Vegetables.  Dairy products, such as milk, cheese, and yogurt (low fat is best).  Breads, grains, pasta, cereal, rice, and beans.  Fats such as olive oil, trans fat-free margarine, canola oil, avocado, and olives. DOES EVERYONE WITH DIABETES MELLITUS HAVE THE SAME MEAL PLAN? Because every person with diabetes mellitus is different, there is not one meal plan that works for everyone. It is very important that you meet with a  dietitian who will help you create a meal plan that is just right for you. Document Released: 05/14/2005 Document Revised: 08/22/2013 Document Reviewed: 07/14/2013 [facility] Patient Information 2015 [place], [place]. This information is not intended to replace advice given to you by your health care provider. Make sure you discuss any questions you have with your health care provider. Psoriasis Psoriasis is a common, long-lasting (chronic) inflammation of the skin. It affects both men and women equally, of all ages and all races. Psoriasis cannot be passed from person to person (not contagious). Psoriasis varies from mild to very severe. When severe, it can greatly affect your quality of life. Psoriasis is an inflammatory disorder affecting the skin as well as other organs including the joints  (causing an arthritis). With psoriasis, the skin sheds its top layer of cells more rapidly than it does in someone without psoriasis. CAUSES  The cause of psoriasis is largely unknown. Genetics, your immune system, and the environment seem to play a role in causing psoriasis. Factors that can make psoriasis worse include:  Damage or trauma to the skin, such as cuts, scrapes, and sunburn. This damage often causes new areas of psoriasis (lesions).  Winter dryness and lack of sunlight.  Medicines such as lithium, beta-blockers, antimalarial drugs, ACE inhibitors, nonsteroidal anti-inflammatory drugs (ibuprofen, aspirin), and terbinafine. Let your caregiver know if you are taking any of these drugs.  Alcohol. Excessive alcohol use should be avoided if you have psoriasis. Drinking large amounts of alcohol can affect:  How well your psoriasis treatment works.  How safe your psoriasis treatment is.  Smoking. If you smoke, ask your caregiver for help to quit.  Stress.  Bacterial or viral infections.  Arthritis. Arthritis associated with psoriasis (psoriatic arthritis) affects less than 10% of patients with psoriasis. The arthritic intensity does not always match the skin psoriasis intensity. It is important to let your caregiver know if your joints hurt or if they are stiff. SYMPTOMS  The most common form of psoriasis begins with little red bumps that gradually become larger. The bumps begin to form scales that flake off easily. The lower layers of scales stick together. When these scales are scratched or removed, the underlying skin is tender and bleeds easily. These areas then grow in size and may become large. Psoriasis often creates a rash that looks the same on both sides of the body (symmetrical). It often affects the elbows, knees, groin, genitals, arms, legs, scalp, and nails. Affected nails often have pitting, loosen, thicken, crumble, and are difficult to treat.  "Inverse  psoriasis"occurs in the armpits, under breasts, in skin folds, and around the groin, buttocks, and genitals.  "Guttate psoriasis" generally occurs in children and young adults following a recent sore throat (strep throat). It begins with many small, red, scaly spots on the skin. It clears spontaneously in weeks or a few months without treatment. DIAGNOSIS  Psoriasis is diagnosed by physical exam. A tissue sample (biopsy) may also be taken. TREATMENT The treatment of psoriasis depends on your age, health, and living conditions.  Steroid (cortisone) creams, lotions, and ointments may be used. These treatments are associated with thinning of the skin, blood vessels that get larger (dilated), loss of skin pigmentation, and easy bruising. It is important to use these steroids as directed by your caregiver. Only treat the affected areas and not the normal, unaffected skin. People on long-term steroid treatment should wear a medical alert bracelet. Injections may be used in areas that are difficult to treat.  Scalp treatments are available as shampoos, solutions, sprays, foams, and oils. Avoid scratching the scalp and picking at the scales.  Anthralin medicine works well on areas that are difficult to treat. However, it stains clothes and skin and may cause temporary irritation.  Synthetic vitamin D (calcipotriene)can be used on small areas. It is available by prescription. The forms of synthetic vitamin D available in health food stores do not help with psoriasis.  Coal tarsare available in various strengths for psoriasis that is difficult to treat. They are one of the longest used treatments for difficult to treat psoriasis. However, they are messy to use.  Light therapy (UV therapy) can be carefully and professionally monitored in a dermatologist's office. Careful sunbathing is helpful for many people as directed by your caregiver. The exposure should be just long enough to cause a mild redness  (erythema) of your skin. Avoid sunburn as this may make the condition worse. Sunscreen (SPF of 30 or higher) should be used to protect against sunburn. Cataracts, wrinkles, and skin aging are some of the harmful side effects of light therapy.  If creams (topical medicines) fail, there are several other options for systemic or oral medicines your caregiver can suggest. Psoriasis can sometimes be very difficult to treat. It can come and go. It is necessary to follow up with your caregiver regularly if your psoriasis is difficult to treat. Usually, with persistence you can get a good amount of relief. Maintaining consistent care is important. Do not change caregivers just because you do not see immediate results. It may take several trials to find the right combination of treatment for you. PREVENTING FLARE-UPS  Wear gloves while you wash dishes, while cleaning, and when you are outside in the cold.  If you have radiators, place a bowl of water or damp towel on the radiator. This will help put water back in the air. You can also use a humidifier to keep the air moist. Try to keep the humidity at about 60% in your home.  Apply moisturizer while your skin is still damp from bathing or showering. This traps water in the skin.  Avoid long, hot baths or showers. Keep soap use to a minimum. Soaps dry out the skin and wash away the protective oils. Use a fragrance free, dye free soap.  Drink enough water and fluids to keep your urine clear or pale yellow. Not drinking enough water depletes your skin's water supply.  Turn off the heat at night and keep it low during the day. Cool air is less drying. SEEK MEDICAL CARE IF:  You have increasing pain in the affected areas.  You have uncontrolled bleeding in the affected areas.  You have increasing redness or warmth in the affected areas.  You start to have pain or stiffness in your joints.  You start feeling depressed about your condition.  You have a  fever. Document Released: 08/14/2000 Document Revised: 11/09/2011 Document Reviewed: 02/09/2011 [facility] Patient Information 2015 [place], [place]. This information is not intended to replace advice given to you by your health care provider. Make sure you discuss any questions you have with your health care provider.

## 2014-04-25 ENCOUNTER — Other Ambulatory Visit: Payer: Self-pay | Admitting: Emergency Medicine

## 2014-04-26 ENCOUNTER — Other Ambulatory Visit: Payer: Self-pay | Admitting: Emergency Medicine

## 2014-05-15 ENCOUNTER — Telehealth: Payer: Self-pay

## 2014-05-15 NOTE — Telephone Encounter (Signed)
Being on prednisone can increase his sugar which might give him these symptoms but if he is still having these symptoms after being off the prednisone for several days it cannot be blamed at this time. I would recommend an OV to discuss if it is still happening.

## 2014-05-15 NOTE — Telephone Encounter (Signed)
Pt called w concern over new Sxs he has been having for the last week. He was started on Metformin and Lisinopril in July for newly Dxd borderline diabetic and everything has been fine (blood sugars running good too, usually under 105) until he was put on a prednisone taper, 6,5,4,3,2,1 on the 8th by [facility] for poison ivy. Right around the same he developed tingling in his hands and feet. It bothers him more at night than during the day, waking him up w/feeling of extremities being "asleep" and tingling. Changing positions helps. He attributed it to the prednisone bc he has had other SEs from prednisone in the past. Pt has been off of the prednisone now for 4 days and these Sxs continue. Pt is having no Sxs of allergic rxn: hives, swelling of lips/mouth/tongue or SOB. I advised that I thought it unlikely that the Sxs would be from the Metformin or lisinopril. Please advise.

## 2014-05-16 NOTE — Telephone Encounter (Signed)
LM for rtn call. 

## 2014-06-08 ENCOUNTER — Ambulatory Visit: Payer: No Typology Code available for payment source | Admitting: Family Medicine

## 2014-06-15 ENCOUNTER — Other Ambulatory Visit: Payer: Self-pay

## 2014-06-28 ENCOUNTER — Ambulatory Visit (INDEPENDENT_AMBULATORY_CARE_PROVIDER_SITE_OTHER): Payer: No Typology Code available for payment source | Admitting: Family Medicine

## 2014-06-28 VITALS — BP 118/72 | HR 71 | Temp 98.4°F | Resp 18 | Ht 66.0 in | Wt 192.0 lb

## 2014-06-28 DIAGNOSIS — R209 Unspecified disturbances of skin sensation: Secondary | ICD-10-CM

## 2014-06-28 DIAGNOSIS — IMO0001 Reserved for inherently not codable concepts without codable children: Secondary | ICD-10-CM

## 2014-06-28 DIAGNOSIS — Z23 Encounter for immunization: Secondary | ICD-10-CM

## 2014-06-28 DIAGNOSIS — H60391 Other infective otitis externa, right ear: Secondary | ICD-10-CM

## 2014-06-28 DIAGNOSIS — E119 Type 2 diabetes mellitus without complications: Secondary | ICD-10-CM

## 2014-06-28 MED ORDER — NEOMYCIN-POLYMYXIN-HC 1 % OT SOLN: 3.0000 [drp] | Freq: Four times a day (QID) | OTIC | Status: DC

## 2014-06-28 NOTE — Progress Notes (Signed)
Subjective:  This chart was scribed for Wardell Honour, MD by Randa Evens, ED Scribe. This patient was seen in room 01 and the patient's care was started at 7:38 PM.   Patient ID: Aaron [name], male    DOB: 01-May-1951, 63 y.o.   MRN: [id number]  06/28/2014  Otalgia, Flu Vaccine and Tingling   Otalgia  Associated symptoms include rhinorrhea. Pertinent negatives include no coughing, ear discharge, headaches, hearing loss or sore throat.   HPI Comments: Aaron [name] is a 63 y.o. male who presents to the [facility] complaining of improving right ear pain onset 4 days prior. He states that he has put cotton in his ear with no relief. He states that he is having some slight rhinorrhea and nasal congestion. He states that the pain started in his left ear and he placed cotton in the ear and the pain resolved on its own. Denies fever, chills, headache, sore throat, cough, ear drainage or post nasal drip. He denies any recent flights. He states he has a Hx of chronic ear aches and swimmers ear.   Denies drainage from ear; hearing might be slightly muffled.   He states that over the past couple of weeks he has noticed tingling in his feet and fingers. He states that he is more worried about the feeling than anything. He presents with a PMHx of diabetes that was recently diagnosed in 02/2014; he has a follow-up appointment with Dr. Delman Cheadle next month. He denies sleep disturbance.  He denies focal weakness, slurred speech, dysphagia.  Sugars are running "good".   Review of Systems  Constitutional: Negative for fever, chills, diaphoresis and fatigue.  HENT: Positive for congestion, ear pain and rhinorrhea. Negative for ear discharge, hearing loss, postnasal drip, sinus pressure and sore throat.   Respiratory: Negative for cough.   Endocrine: Negative for cold intolerance, heat intolerance, polydipsia, polyphagia and polyuria.  Neurological: Positive for numbness. Negative  for dizziness, tremors, seizures, syncope, facial asymmetry, speech difficulty, weakness, light-headedness and headaches.    Past Medical History  Diagnosis Date  . Diabetes mellitus without complication    History reviewed. No pertinent past surgical history. Allergies  Allergen Reactions  . Erythromycin Itching   Current Outpatient Prescriptions  Medication Sig Dispense Refill  . glucose blood test strip Use as instructed  100 each  12  . glucose monitoring kit (FREESTYLE) monitoring kit 1 each by Does not apply route as needed for other.  1 each  0  . lisinopril (PRINIVIL,ZESTRIL) 10 MG tablet Take 1 tablet (10 mg total) by mouth daily.  90 tablet  3  . metFORMIN (GLUCOPHAGE XR) 500 MG 24 hr tablet 1 tab with evening meal for 1 week, 2 second week, 3 third week, 4 fourth and beyond 4  120 tablet  5  . ONETOUCH DELICA LANCETS FINE MISC Test blood sugar daily.  100 each  3  . Fluocinonide 0.1 % CREA Apply 1 application topically 4 (four) times daily as needed (rash).  120 g  0  . NEOMYCIN-POLYMYXIN-HYDROCORTISONE (CORTISPORIN) 1 % SOLN otic solution Place 3 drops into the right ear every 6 (six) hours.  10 mL  0  . zoster vaccine live, PF, (ZOSTAVAX) 58527 UNT/0.65ML injection Inject 19,400 Units into the skin once.  1 vial  0   No current facility-administered medications for this visit.       Objective:    BP 118/72  Pulse 71  Temp(Src) 98.4 F (  36.9 C) (Oral)  Resp 18  Ht _0  (1.676 m)  Wt 192 lb (87.091 kg)  BMI 31.00 kg/m2  SpO2 98%  Physical Exam  Nursing note and vitals reviewed. Constitutional: He is oriented to person, place, and time. He appears well-developed and well-nourished. No distress.  HENT:  Head: Normocephalic and atraumatic.  Right Ear: Hearing and tympanic membrane normal. No lacerations. There is swelling. No drainage. No foreign bodies. No mastoid tenderness. Tympanic membrane is not injected, not perforated, not erythematous, not retracted and  not bulging.  Left Ear: Tympanic membrane, external ear and ear canal normal.  Nose: Nose normal.  Mouth/Throat: Oropharynx is clear and moist. No oropharyngeal exudate.  Right ear canal edematous with some flaking of skin  Eyes: Conjunctivae and EOM are normal. Pupils are equal, round, and reactive to light.  Neck: Normal range of motion. Neck supple. No tracheal deviation present.  Cardiovascular: Normal rate, regular rhythm, normal heart sounds and intact distal pulses.  Exam reveals no gallop and no friction rub.   No murmur heard. Capillary refill < 3 seconds x 4 extremities.  Pulmonary/Chest: Effort normal. No respiratory distress. He has no wheezes. He has no rales.  Musculoskeletal: Normal range of motion.  Lymphadenopathy:    He has no cervical adenopathy.  Neurological: He is alert and oriented to person, place, and time. He has normal strength. No cranial nerve deficit or sensory deficit. He exhibits normal muscle tone. Coordination normal.  Skin: Skin is warm and dry. No rash noted. He is not diaphoretic.  Psychiatric: He has a normal mood and affect. His behavior is normal.    Results for orders placed in visit on 48/88/91  BASIC METABOLIC PANEL      Result Value Ref Range   Sodium 139  135 - 145 mEq/L   Potassium 4.7  3.5 - 5.3 mEq/L   Chloride 104  96 - 112 mEq/L   CO2 24  19 - 32 mEq/L   Glucose, Bld 107 (*) 70 - 99 mg/dL   BUN 13  6 - 23 mg/dL   Creat 0.83  0.50 - 1.35 mg/dL   Calcium 9.6  8.4 - 10.5 mg/dL  MICROALBUMIN / CREATININE URINE RATIO      Result Value Ref Range   Microalb, Ur 0.88  0.00 - 1.89 mg/dL   Creatinine, Urine 181.1     Microalb Creat Ratio 4.9  0.0 - 30.0 mg/g  GLUCOSE, POCT (MANUAL RESULT ENTRY)      Result Value Ref Range   POC Glucose 126 (*) 70 - 99 mg/dl   INFLUENZA VACCINE ADMINISTERED.    Assessment & Plan:   1. Needs flu shot   2. Otitis, externa, infective, right   3. Type 2 diabetes mellitus without complication   4.  Paresthesias/numbness     1.  R otitis externa:  New.  rx for Cortisporin Otic provided; call if worsening symptoms in upcoming 48-72 hours.   2 . DMII: newly diagnosed with improvement with Metformin,dietary modification, Metformin.  Follow-up with Dr. Brigitte Pulse next month. 3.  Paresthesias in all four extremities:  New. Consistent with diabetic neuropathy; recommend discussing further with Dr. Brigitte Pulse in one month.  Also encourage aggressive control of sugars.  Recommend B12 level, TSH as well as diabetic appropriate labs. 4.  S/p flu vaccine.   Meds ordered this encounter  Medications  . NEOMYCIN-POLYMYXIN-HYDROCORTISONE (CORTISPORIN) 1 % SOLN otic solution    Sig: Place 3 drops into the right ear every  6 (six) hours.    Dispense:  10 mL    Refill:  0    No Follow-up on file.   I personally performed the services described in this documentation, which was scribed in my presence.  The recorded information has been reviewed and is accurate.  Reginia Forts, M.D.  [facility] [address] [place], [place]  [address] [phone] phone [phone] fax

## 2014-06-28 NOTE — Patient Instructions (Signed)

## 2014-06-29 ENCOUNTER — Telehealth: Payer: Self-pay

## 2014-06-29 NOTE — Telephone Encounter (Signed)
Patient called. States he has questions about ear drops and the fact that the bottle states to not use them if you've ever had a perforated eardrum. Patient states he is pretty sure he's been told that he had a perforated ear drum at one time. Please return call and advise. Will be at work until 6 pm.

## 2014-06-29 NOTE — Telephone Encounter (Signed)
LM for rtn call. 

## 2014-06-30 NOTE — Telephone Encounter (Signed)
Dr. Tamala Julian did not see a perforated ear drum at the time of his visit. Ok to use. Pt notified.

## 2014-07-20 ENCOUNTER — Encounter: Payer: Self-pay | Admitting: Family Medicine

## 2014-07-20 ENCOUNTER — Ambulatory Visit (INDEPENDENT_AMBULATORY_CARE_PROVIDER_SITE_OTHER): Payer: No Typology Code available for payment source | Admitting: Family Medicine

## 2014-07-20 VITALS — BP 124/78 | HR 81 | Temp 97.9°F | Resp 16 | Ht 65.2 in | Wt 189.2 lb

## 2014-07-20 DIAGNOSIS — L409 Psoriasis, unspecified: Secondary | ICD-10-CM

## 2014-07-20 DIAGNOSIS — H60391 Other infective otitis externa, right ear: Secondary | ICD-10-CM

## 2014-07-20 DIAGNOSIS — G629 Polyneuropathy, unspecified: Secondary | ICD-10-CM

## 2014-07-20 DIAGNOSIS — E785 Hyperlipidemia, unspecified: Secondary | ICD-10-CM

## 2014-07-20 DIAGNOSIS — H9319 Tinnitus, unspecified ear: Secondary | ICD-10-CM

## 2014-07-20 DIAGNOSIS — E1142 Type 2 diabetes mellitus with diabetic polyneuropathy: Secondary | ICD-10-CM

## 2014-07-20 LAB — RPR

## 2014-07-20 LAB — CBC WITH DIFFERENTIAL/PLATELET
Basophils Absolute: 0.1 10*3/uL (ref 0.0–0.1)
Basophils Relative: 1 % (ref 0–1)
Eosinophils Absolute: 0.1 10*3/uL (ref 0.0–0.7)
Eosinophils Relative: 1 % (ref 0–5)
HCT: 46.9 % (ref 39.0–52.0)
Hemoglobin: 15.7 g/dL (ref 13.0–17.0)
Lymphocytes Relative: 30 % (ref 12–46)
Lymphs Abs: 2 10*3/uL (ref 0.7–4.0)
MCH: 28 pg (ref 26.0–34.0)
MCHC: 33.5 g/dL (ref 30.0–36.0)
MCV: 83.6 fL (ref 78.0–100.0)
MPV: 10.3 fL (ref 9.4–12.4)
Monocytes Absolute: 0.4 10*3/uL (ref 0.1–1.0)
Monocytes Relative: 6 % (ref 3–12)
Neutro Abs: 4.1 10*3/uL (ref 1.7–7.7)
Neutrophils Relative %: 62 % (ref 43–77)
Platelets: 289 10*3/uL (ref 150–400)
RBC: 5.61 MIL/uL (ref 4.22–5.81)
RDW: 13.7 % (ref 11.5–15.5)
WBC: 6.6 10*3/uL (ref 4.0–10.5)

## 2014-07-20 LAB — TSH: TSH: 1.532 u[IU]/mL (ref 0.350–4.500)

## 2014-07-20 LAB — COMPREHENSIVE METABOLIC PANEL
ALT: 14 U/L (ref 0–53)
AST: 15 U/L (ref 0–37)
Albumin: 4.3 g/dL (ref 3.5–5.2)
Alkaline Phosphatase: 64 U/L (ref 39–117)
BUN: 12 mg/dL (ref 6–23)
CO2: 24 mEq/L (ref 19–32)
Calcium: 9.7 mg/dL (ref 8.4–10.5)
Chloride: 104 mEq/L (ref 96–112)
Creat: 0.72 mg/dL (ref 0.50–1.35)
Glucose, Bld: 120 mg/dL — ABNORMAL HIGH (ref 70–99)
Potassium: 4.6 mEq/L (ref 3.5–5.3)
Sodium: 139 mEq/L (ref 135–145)
Total Bilirubin: 0.4 mg/dL (ref 0.2–1.2)
Total Protein: 7.1 g/dL (ref 6.0–8.3)

## 2014-07-20 LAB — VITAMIN B12: Vitamin B-12: 379 pg/mL (ref 211–911)

## 2014-07-20 LAB — LIPID PANEL
Cholesterol: 154 mg/dL (ref 0–200)
HDL: 39 mg/dL — ABNORMAL LOW (ref >39)
LDL Cholesterol: 98 mg/dL (ref 0–99)
Total CHOL/HDL Ratio: 3.9 Ratio
Triglycerides: 84 mg/dL (ref <150)
VLDL: 17 mg/dL (ref 0–40)

## 2014-07-20 LAB — C-REACTIVE PROTEIN: CRP: <0.5 mg/dL (ref <0.60)

## 2014-07-20 LAB — POCT GLYCOSYLATED HEMOGLOBIN (HGB A1C): Hemoglobin A1C: 5.6

## 2014-07-20 LAB — POCT SEDIMENTATION RATE: POCT SED RATE: 9 mm/hr (ref 0–22)

## 2014-07-20 MED ORDER — CIPROFLOXACIN-DEXAMETHASONE 0.3-0.1 % OT SUSP: 4.0000 [drp] | Freq: Two times a day (BID) | OTIC | Status: DC

## 2014-07-20 MED ORDER — METFORMIN HCL ER 500 MG PO TB24: 1000.0000 mg | ORAL_TABLET | Freq: Every day | ORAL | Status: DC

## 2014-07-20 NOTE — Progress Notes (Signed)
Subjective:    Patient ID: Aaron [name], male    DOB: 07-21-1951, 63 y.o.   MRN: [id number] This chart was scribed for Delman Cheadle, MD by Zola Button, Medical Scribe. This patient was seen in Room 27 and the patient's care was started at 10:06 AM.   Chief Complaint  Patient presents with  . Diabetes  . Follow-up    right ear pain still irritated.  ringing in both ears.  . tingling in feet and hands    HPI HPI Comments: Aaron [name] is a 63 y.o. male with a hx of DM who presents to the [facility] for a follow-up for his diabetes.   Diabetes: Patient brings in his CBG readings from home. He does note having a few low readings of 74 and 63, but he did not feel any differences when he had the low readings. He has been feeling occasional tingling in his feet, fingers and hands that began a few weeks ago, more noticeable at night when laying down. He has not eaten yet today.  Ear: He saw Dr. Tamala Julian on 10/29 for otalgia; he feels as if his right ear has not completely recovered and also notes having tinnitus in both ears that began a few weeks ago.   Medications: He denies any side effects, including cough, diarrhea, and eczema, from taking his medications.  Past Medical History  Diagnosis Date  . Diabetes mellitus without complication    Current Outpatient Prescriptions on File Prior to Visit  Medication Sig Dispense Refill  . Fluocinonide 0.1 % CREA Apply 1 application topically 4 (four) times daily as needed (rash). 120 g 0  . glucose blood test strip Use as instructed 100 each 12  . glucose monitoring kit (FREESTYLE) monitoring kit 1 each by Does not apply route as needed for other. 1 each 0  . ONETOUCH DELICA LANCETS FINE MISC Test blood sugar daily. 100 each 3  . zoster vaccine live, PF, (ZOSTAVAX) 18299 UNT/0.65ML injection Inject 19,400 Units into the skin once. 1 vial 0   No current facility-administered medications on file prior to visit.   Allergies    Allergen Reactions  . Erythromycin Itching    Review of Systems  HENT: Positive for ear pain and tinnitus.   Respiratory: Negative for cough.   Gastrointestinal: Negative for diarrhea.  Skin: Negative for rash.  Neurological: Positive for numbness.       Objective:  BP 124/78 mmHg  Pulse 81  Temp(Src) 97.9 F (36.6 C) (Oral)  Resp 16  Ht 5' 5.2" (1.656 m)  Wt 189 lb 3.2 oz (85.821 kg)  BMI 31.29 kg/m2  SpO2 98%  Physical Exam  Constitutional: He is oriented to person, place, and time. He appears well-developed and well-nourished. No distress.  HENT:  Head: Normocephalic and atraumatic.  Right Ear: Tympanic membrane is retracted.  Left Ear: Ear canal normal.  Mouth/Throat: Oropharynx is clear and moist. No oropharyngeal exudate.  Swelling in right external ear and canal with purulence. Nasal mucosal erythema.  Eyes: Pupils are equal, round, and reactive to light.  Neck: Neck supple. No thyromegaly present.  Thyroid normal.  Cardiovascular: Normal rate, regular rhythm, S1 normal, S2 normal and normal heart sounds.   No murmur heard. Pulses:      Dorsalis pedis pulses are 2+ on the right side, and 2+ on the left side.       Posterior tibial pulses are 2+ on the right side, and 2+ on  the left side.  Pulmonary/Chest: Effort normal.  Musculoskeletal: He exhibits no edema.  Neurological: He is alert and oriented to person, place, and time. No cranial nerve deficit.  Skin: Skin is warm and dry. No rash noted.  Bilateral great toes with nail thickened, cracking and yellow. All other toenails normal. No skin abnormalities.  Psychiatric: He has a normal mood and affect. His behavior is normal.  Nursing note and vitals reviewed.      Assessment & Plan:   Type 2 diabetes mellitus with diabetic polyneuropathy - Plan: HM Diabetes Foot Exam, POCT glycosylated hemoglobin (Hb A1C), Comprehensive metabolic panel - Pt has done great with tlc - hgba1c decreased from 8.2 4 mos prior to  5.6 today so decrease metformin XR from 2000qhs to 1000qhs.  Ok to d/c lisinopril. Recheck in 3-4 months.   Peripheral neuropathy - Plan: POCT SEDIMENTATION RATE, TSH, RPR, Vitamin B12, C-reactive protein - will  Need neurology eval if no cause can be identified on labs today.  Tinnitus, unspecified laterality - Plan: CBC with Differential, Ambulatory referral to ENT, CANCELED: Ambulatory referral to ENT  Otitis, externa, infective, right - Plan: CBC with Differential, CANCELED: POCT CBC  Psoriasis - responding to top steroids  Hyperlipidemia LDL goal <100 - Plan: Lipid panel - at goal w/o medicaiton - cont tlc  Meds ordered this encounter  Medications  . ciprofloxacin-dexamethasone (CIPRODEX) otic suspension    Sig: Place 4 drops into the right ear 2 (two) times daily.    Dispense:  7.5 mL    Refill:  0  . metFORMIN (GLUCOPHAGE XR) 500 MG 24 hr tablet    Sig: Take 2 tablets (1,000 mg total) by mouth at bedtime.    Dispense:  180 tablet    Refill:  3    I personally performed the services described in this documentation, which was scribed in my presence. The recorded information has been reviewed and considered, and addended by me as needed.  Delman Cheadle, MD MPH   Results for orders placed or performed in visit on 07/20/14  Lipid panel  Result Value Ref Range   Cholesterol 154 0 - 200 mg/dL   Triglycerides 84 <150 mg/dL   HDL 39 (L) >39 mg/dL   Total CHOL/HDL Ratio 3.9 Ratio   VLDL 17 0 - 40 mg/dL   LDL Cholesterol 98 0 - 99 mg/dL  Comprehensive metabolic panel  Result Value Ref Range   Sodium 139 135 - 145 mEq/L   Potassium 4.6 3.5 - 5.3 mEq/L   Chloride 104 96 - 112 mEq/L   CO2 24 19 - 32 mEq/L   Glucose, Bld 120 (H) 70 - 99 mg/dL   BUN 12 6 - 23 mg/dL   Creat 0.72 0.50 - 1.35 mg/dL   Total Bilirubin 0.4 0.2 - 1.2 mg/dL   Alkaline Phosphatase 64 39 - 117 U/L   AST 15 0 - 37 U/L   ALT 14 0 - 53 U/L   Total Protein 7.1 6.0 - 8.3 g/dL   Albumin 4.3 3.5 - 5.2 g/dL    Calcium 9.7 8.4 - 10.5 mg/dL  TSH  Result Value Ref Range   TSH 1.532 0.350 - 4.500 uIU/mL  RPR  Result Value Ref Range   RPR NON REAC NON REAC  Vitamin B12  Result Value Ref Range   Vitamin B-12 379 211 - 911 pg/mL  C-reactive protein  Result Value Ref Range   CRP <0.5 <0.60 mg/dL  CBC with Differential  Result  Value Ref Range   WBC 6.6 4.0 - 10.5 K/uL   RBC 5.61 4.22 - 5.81 MIL/uL   Hemoglobin 15.7 13.0 - 17.0 g/dL   HCT 46.9 39.0 - 52.0 %   MCV 83.6 78.0 - 100.0 fL   MCH 28.0 26.0 - 34.0 pg   MCHC 33.5 30.0 - 36.0 g/dL   RDW 13.7 11.5 - 15.5 %   Platelets 289 150 - 400 K/uL   MPV 10.3 9.4 - 12.4 fL   Neutrophils Relative % 62 43 - 77 %   Neutro Abs 4.1 1.7 - 7.7 K/uL   Lymphocytes Relative 30 12 - 46 %   Lymphs Abs 2.0 0.7 - 4.0 K/uL   Monocytes Relative 6 3 - 12 %   Monocytes Absolute 0.4 0.1 - 1.0 K/uL   Eosinophils Relative 1 0 - 5 %   Eosinophils Absolute 0.1 0.0 - 0.7 K/uL   Basophils Relative 1 0 - 1 %   Basophils Absolute 0.1 0.0 - 0.1 K/uL   Smear Review Criteria for review not met   POCT SEDIMENTATION RATE  Result Value Ref Range   POCT SED RATE 9 0 - 22 mm/hr  POCT glycosylated hemoglobin (Hb A1C)  Result Value Ref Range   Hemoglobin A1C 5.6

## 2014-11-16 ENCOUNTER — Other Ambulatory Visit: Payer: Self-pay | Admitting: Physician Assistant

## 2015-01-20 ENCOUNTER — Ambulatory Visit (INDEPENDENT_AMBULATORY_CARE_PROVIDER_SITE_OTHER): Payer: No Typology Code available for payment source | Admitting: Emergency Medicine

## 2015-01-20 VITALS — BP 126/80 | HR 86 | Temp 98.0°F | Resp 16 | Ht 66.5 in | Wt 196.0 lb

## 2015-01-20 DIAGNOSIS — R61 Generalized hyperhidrosis: Secondary | ICD-10-CM

## 2015-01-20 LAB — COMPREHENSIVE METABOLIC PANEL
ALT: 18 U/L (ref 0–53)
AST: 14 U/L (ref 0–37)
Albumin: 4 g/dL (ref 3.5–5.2)
Alkaline Phosphatase: 63 U/L (ref 39–117)
BUN: 15 mg/dL (ref 6–23)
CO2: 22 mEq/L (ref 19–32)
Calcium: 9 mg/dL (ref 8.4–10.5)
Chloride: 106 mEq/L (ref 96–112)
Creat: 0.74 mg/dL (ref 0.50–1.35)
Glucose, Bld: 151 mg/dL — ABNORMAL HIGH (ref 70–99)
Potassium: 4.4 mEq/L (ref 3.5–5.3)
Sodium: 136 mEq/L (ref 135–145)
Total Bilirubin: 0.4 mg/dL (ref 0.2–1.2)
Total Protein: 6.9 g/dL (ref 6.0–8.3)

## 2015-01-20 LAB — POCT CBC
Granulocyte percent: 64.5 %G (ref 37–80)
HCT, POC: 49.5 % (ref 43.5–53.7)
Hemoglobin: 15.7 g/dL (ref 14.1–18.1)
Lymph, poc: 1.8 (ref 0.6–3.4)
MCH, POC: 25.9 pg — AB (ref 27–31.2)
MCHC: 31.7 g/dL — AB (ref 31.8–35.4)
MCV: 81.7 fL (ref 80–97)
MID (cbc): 0.3 (ref 0–0.9)
MPV: 7.4 fL (ref 0–99.8)
POC Granulocyte: 3.9 (ref 2–6.9)
POC LYMPH PERCENT: 30.3 %L (ref 10–50)
POC MID %: 5.2 %M (ref 0–12)
Platelet Count, POC: 278 10*3/uL (ref 142–424)
RBC: 6.06 M/uL (ref 4.69–6.13)
RDW, POC: 15.2 %
WBC: 6.1 10*3/uL (ref 4.6–10.2)

## 2015-01-20 LAB — POCT URINALYSIS DIPSTICK
Bilirubin, UA: NEGATIVE
Blood, UA: NEGATIVE
Glucose, UA: NEGATIVE
Ketones, UA: NEGATIVE
Leukocytes, UA: NEGATIVE
Nitrite, UA: NEGATIVE
Protein, UA: NEGATIVE
Spec Grav, UA: 1.025
Urobilinogen, UA: 0.2
pH, UA: 5

## 2015-01-20 LAB — POCT UA - MICROSCOPIC ONLY
Bacteria, U Microscopic: NEGATIVE
Casts, Ur, LPF, POC: NEGATIVE
Crystals, Ur, HPF, POC: NEGATIVE
Mucus, UA: NEGATIVE
Yeast, UA: NEGATIVE

## 2015-01-20 LAB — POCT GLYCOSYLATED HEMOGLOBIN (HGB A1C): Hemoglobin A1C: 6

## 2015-01-20 NOTE — Progress Notes (Signed)
Subjective:  Patient ID: Aaron [name], male    DOB: [birth year]  Age: 64 y.o. MRN: [id number]  CC: Thrush; Generalized Body Aches; and Night Sweats   HPI Aaron [name] presents for evaluation of his oropharynx. He  describes a concern of having thrush. He said that he previously had a round of's thrush in the past and he said this sore throat that he has swelling of his tongue feels exactly like it. He has night sweats and generalized muscle aches and body aches denies any documented fever or chills. Denies any nausea vomiting. Denies any cough, wheezing, or shortness breath. Denies any hemoptysis. Denies any stool change. He has no rash.  He has no improvement with over-the-counter medication. Denies any history of diabetes.  Outpatient Prescriptions Prior to Visit  Medication Sig Dispense Refill  . Fluocinonide 0.1 % CREA Apply 1 application topically 4 (four) times daily as needed (rash). 120 g 0  . glucose blood test strip Use as instructed 100 each 12  . glucose monitoring kit (FREESTYLE) monitoring kit 1 each by Does not apply route as needed for other. 1 each 0  . metFORMIN (GLUCOPHAGE XR) 500 MG 24 hr tablet Take 2 tablets (1,000 mg total) by mouth at bedtime. 180 tablet 3  . ONETOUCH DELICA LANCETS FINE MISC TEST BLOOD SUGAR DAILY 100 each 2  . zoster vaccine live, PF, (ZOSTAVAX) 12751 UNT/0.65ML injection Inject 19,400 Units into the skin once. 1 vial 0  . ciprofloxacin-dexamethasone (CIPRODEX) otic suspension Place 4 drops into the right ear 2 (two) times daily. 7.5 mL 0   No facility-administered medications prior to visit.    ROS Review of Systems  Constitutional: Positive for fever, chills and fatigue. Negative for appetite change.  HENT: Positive for sore throat. Negative for congestion, ear pain, postnasal drip and sinus pressure.   Eyes: Negative for pain and redness.  Respiratory: Negative for cough, shortness of breath and wheezing.   Cardiovascular: Negative  for leg swelling.  Gastrointestinal: Negative for nausea, vomiting, abdominal pain, diarrhea, constipation and blood in stool.  Endocrine: Negative for polyuria.  Genitourinary: Negative for dysuria, urgency, frequency and flank pain.  Musculoskeletal: Negative for gait problem.  Skin: Negative for rash.  Neurological: Negative for weakness and headaches.  Psychiatric/Behavioral: Negative for confusion and decreased concentration. The patient is not nervous/anxious.     Objective:  BP 126/80 mmHg  Pulse 86  Temp(Src) 98 F (36.7 C)  Resp 16  Ht 5' 6.5" (1.689 m)  Wt 196 lb (88.905 kg)  BMI 31.16 kg/m2  SpO2 98%  BP Readings from Last 3 Encounters:  01/20/15 126/80  07/20/14 124/78  06/28/14 118/72    Wt Readings from Last 3 Encounters:  01/20/15 196 lb (88.905 kg)  07/20/14 189 lb 3.2 oz (85.821 kg)  06/28/14 192 lb (87.091 kg)    Physical Exam  Constitutional: He is oriented to person, place, and time. He appears well-developed and well-nourished. No distress.  HENT:  Head: Normocephalic and atraumatic.  Right Ear: External ear normal.  Left Ear: External ear normal.  Nose: Nose normal.  Eyes: Conjunctivae and EOM are normal. Pupils are equal, round, and reactive to light. No scleral icterus.  Neck: Normal range of motion. Neck supple. No tracheal deviation present.  Cardiovascular: Normal rate, regular rhythm and normal heart sounds.   Pulmonary/Chest: Effort normal. No respiratory distress. He has no wheezes. He has no rales.  Abdominal: He exhibits no mass. There is no tenderness. There is  no rebound and no guarding.  Musculoskeletal: He exhibits no edema.  Lymphadenopathy:    He has no cervical adenopathy.  Neurological: He is alert and oriented to person, place, and time. Coordination normal.  Skin: Skin is warm and dry. No rash noted.  Psychiatric: He has a normal mood and affect. His behavior is normal. Thought content normal.    Lab Results  Component  Value Date   WBC 6.1 01/20/2015   HGB 15.7 01/20/2015   HCT 49.5 01/20/2015   PLT 289 07/20/2014   GLUCOSE 120* 07/20/2014   CHOL 154 07/20/2014   TRIG 84 07/20/2014   HDL 39* 07/20/2014   LDLCALC 98 07/20/2014   ALT 14 07/20/2014   AST 15 07/20/2014   NA 139 07/20/2014   K 4.6 07/20/2014   CL 104 07/20/2014   CREATININE 0.72 07/20/2014   BUN 12 07/20/2014   CO2 24 07/20/2014   TSH 1.532 07/20/2014   PSA 1.75 03/05/2014   HGBA1C 6.0 01/20/2015   MICROALBUR 0.88 04/06/2014      Assessment & Plan:   Chidi was seen today for thrush, generalized body aches and night sweats.  Diagnoses and all orders for this visit:  Night sweat Orders: -     POCT CBC -     Comprehensive metabolic panel -     POCT glycosylated hemoglobin (Hb A1C) -     POCT UA - Microscopic Only -     POCT urinalysis dipstick   I have discontinued Mr. Lantry ciprofloxacin-dexamethasone. I am also having him maintain his glucose monitoring kit, glucose blood, zoster vaccine live (PF), Fluocinonide, metFORMIN, and ONETOUCH DELICA LANCETS FINE.  No orders of the defined types were placed in this encounter.   Results for orders placed or performed in visit on 01/20/15  POCT CBC  Result Value Ref Range   WBC 6.1 4.6 - 10.2 K/uL   Lymph, poc 1.8 0.6 - 3.4   POC LYMPH PERCENT 30.3 10 - 50 %L   MID (cbc) 0.3 0 - 0.9   POC MID % 5.2 0 - 12 %M   POC Granulocyte 3.9 2 - 6.9   Granulocyte percent 64.5 37 - 80 %G   RBC 6.06 4.69 - 6.13 M/uL   Hemoglobin 15.7 14.1 - 18.1 g/dL   HCT, POC 49.5 43.5 - 53.7 %   MCV 81.7 80 - 97 fL   MCH, POC 25.9 (A) 27 - 31.2 pg   MCHC 31.7 (A) 31.8 - 35.4 g/dL   RDW, POC 15.2 %   Platelet Count, POC 278 142 - 424 K/uL   MPV 7.4 0 - 99.8 fL  POCT glycosylated hemoglobin (Hb A1C)  Result Value Ref Range   Hemoglobin A1C 6.0   POCT UA - Microscopic Only  Result Value Ref Range   WBC, Ur, HPF, POC 0-1    RBC, urine, microscopic 0-1    Bacteria, U Microscopic neg     Mucus, UA neg    Epithelial cells, urine per micros 0-2    Crystals, Ur, HPF, POC neg    Casts, Ur, LPF, POC neg    Yeast, UA neg   POCT urinalysis dipstick  Result Value Ref Range   Color, UA yellow    Clarity, UA clear    Glucose, UA neg    Bilirubin, UA neg    Ketones, UA neg    Spec Grav, UA 1.025    Blood, UA neg    pH, UA 5.0  Protein, UA neg    Urobilinogen, UA 0.2    Nitrite, UA neg    Leukocytes, UA Negative     Follow-up: Return in about 1 week (around 01/27/2015).  Roselee Culver, MD

## 2015-02-17 ENCOUNTER — Other Ambulatory Visit: Payer: Self-pay | Admitting: Emergency Medicine

## 2015-02-17 ENCOUNTER — Ambulatory Visit (INDEPENDENT_AMBULATORY_CARE_PROVIDER_SITE_OTHER): Payer: No Typology Code available for payment source

## 2015-02-17 ENCOUNTER — Ambulatory Visit (INDEPENDENT_AMBULATORY_CARE_PROVIDER_SITE_OTHER): Payer: No Typology Code available for payment source | Admitting: Emergency Medicine

## 2015-02-17 VITALS — BP 150/80 | HR 80 | Temp 98.2°F | Resp 16 | Wt 196.0 lb

## 2015-02-17 DIAGNOSIS — M25512 Pain in left shoulder: Secondary | ICD-10-CM

## 2015-02-17 DIAGNOSIS — S20212A Contusion of left front wall of thorax, initial encounter: Secondary | ICD-10-CM

## 2015-02-17 DIAGNOSIS — L918 Other hypertrophic disorders of the skin: Secondary | ICD-10-CM | POA: Diagnosis not present

## 2015-02-17 MED ORDER — NAPROXEN SODIUM 550 MG PO TABS: 550.0000 mg | ORAL_TABLET | Freq: Two times a day (BID) | ORAL | Status: DC

## 2015-02-17 MED ORDER — TRAMADOL HCL 50 MG PO TABS: 50.0000 mg | ORAL_TABLET | Freq: Three times a day (TID) | ORAL | Status: DC | PRN

## 2015-02-17 NOTE — Patient Instructions (Signed)

## 2015-02-17 NOTE — Progress Notes (Signed)
Subjective:  Patient ID: Aaron [name], male    DOB: 07-Sep-1950  Age: 64 y.o. MRN: [id number]  CC: Shoulder Injury; Back Injury; and Rib Injury   HPI Aaron [name] presents  following a fall but 10 days ago. He said he slipped in his backyard while going down the basement was loaded laundry and fell on the ground. He landed on his right side. Pain in his right shoulder that is persistent. He does not have any impairment of his usual activities daily living. He is also complaining of right chest wall pain said he has no hemoptysis cough shortness of breath or wheezing. He has no nausea or vomiting no abdominal pain. No dizziness or lightheadedness. Said that he slipped and that was the cause of his fall. He said the ground wet. Denies any improvement with over-the-counter medication.  He has a small skin tag on his forehead that he wishes to have removed.  History Aaron [name] has a past medical history of Diabetes mellitus without complication.   He has no past surgical history on file.   His  family history includes Cancer in his father and mother.  He   reports that he has quit smoking. He has never used smokeless tobacco. He reports that he drinks alcohol. He reports that he does not use illicit drugs.  Outpatient Prescriptions Prior to Visit  Medication Sig Dispense Refill  . Fluocinonide 0.1 % CREA Apply 1 application topically 4 (four) times daily as needed (rash). 120 g 0  . glucose blood test strip Use as instructed 100 each 12  . glucose monitoring kit (FREESTYLE) monitoring kit 1 each by Does not apply route as needed for other. 1 each 0  . metFORMIN (GLUCOPHAGE XR) 500 MG 24 hr tablet Take 2 tablets (1,000 mg total) by mouth at bedtime. 180 tablet 3  . ONETOUCH DELICA LANCETS FINE MISC TEST BLOOD SUGAR DAILY 100 each 2  . zoster vaccine live, PF, (ZOSTAVAX) 22336 UNT/0.65ML injection Inject 19,400 Units into the skin once. 1 vial 0   No facility-administered medications  prior to visit.    History   Social History  . Marital Status: Married    Spouse Name: N/A  . Number of Children: N/A  . Years of Education: N/A   Social History Main Topics  . Smoking status: Former Research scientist (life sciences)  . Smokeless tobacco: Never Used  . Alcohol Use: Yes     Comment: 2-3 beers a week  . Drug Use: No  . Sexual Activity: Not on file   Other Topics Concern  . None   Social History Narrative     Review of Systems  Objective:  BP 150/80 mmHg  Pulse 80  Temp(Src) 98.2 F (36.8 C)  Resp 16  Wt 196 lb (88.905 kg)  SpO2 98%  Physical Exam    Assessment & Plan:   Aaron [name] was seen today for shoulder injury, back injury and rib injury.  Diagnoses and all orders for this visit:  Contusion, chest wall, left, initial encounter Orders: -     Cancel: DG Shoulder Left; Future -     DG Chest 2 View; Future  Pain in joint, shoulder region, left Orders: -     Cancel: DG Shoulder Left; Future -     DG Chest 2 View; Future  Skin tag  Other orders -     naproxen sodium (ANAPROX DS) 550 MG tablet; Take 1 tablet (550 mg total) by mouth 2 (two) times daily with  a meal. -     traMADol (ULTRAM) 50 MG tablet; Take 1 tablet (50 mg total) by mouth every 8 (eight) hours as needed.   I have discontinued Mr. Caras zoster vaccine live (PF). I am also having him start on naproxen sodium and traMADol. Additionally, I am having him maintain his glucose monitoring kit, glucose blood, Fluocinonide, metFORMIN, and ONETOUCH DELICA LANCETS FINE.  Meds ordered this encounter  Medications  . naproxen sodium (ANAPROX DS) 550 MG tablet    Sig: Take 1 tablet (550 mg total) by mouth 2 (two) times daily with a meal.    Dispense:  40 tablet    Refill:  0  . traMADol (ULTRAM) 50 MG tablet    Sig: Take 1 tablet (50 mg total) by mouth every 8 (eight) hours as needed.    Dispense:  30 tablet    Refill:  0    Appropriate red flag conditions were discussed with the patient as well as  actions that should be taken.  Patient expressed his understanding.  Follow-up: Return if symptoms worsen or fail to improve.  Roselee Culver, MD  Skin tag was injected in the base of lidocaine with epinephrine following that the stalk was amputated and Drysol was applied for bleeding control. At his request was no pathology specimen sent   [facility] reading (PRIMARY) by  Dr. Ouida Sills.  negative.

## 2015-04-05 ENCOUNTER — Other Ambulatory Visit: Payer: Self-pay | Admitting: Emergency Medicine

## 2015-04-06 NOTE — Telephone Encounter (Signed)
Patient is follow up on refill request. He states he is out of testing strips and that he hasn't been able to test his glucose level.

## 2015-04-08 ENCOUNTER — Ambulatory Visit (INDEPENDENT_AMBULATORY_CARE_PROVIDER_SITE_OTHER): Payer: Managed Care, Other (non HMO) | Admitting: Physician Assistant

## 2015-04-08 VITALS — BP 138/84 | HR 84 | Temp 98.8°F | Resp 16 | Ht 66.0 in | Wt 199.0 lb

## 2015-04-08 DIAGNOSIS — M25511 Pain in right shoulder: Secondary | ICD-10-CM | POA: Diagnosis not present

## 2015-04-08 DIAGNOSIS — M549 Dorsalgia, unspecified: Secondary | ICD-10-CM | POA: Diagnosis not present

## 2015-04-08 DIAGNOSIS — M25512 Pain in left shoulder: Secondary | ICD-10-CM | POA: Diagnosis not present

## 2015-04-08 DIAGNOSIS — E1142 Type 2 diabetes mellitus with diabetic polyneuropathy: Secondary | ICD-10-CM | POA: Diagnosis not present

## 2015-04-08 MED ORDER — CYCLOBENZAPRINE HCL 10 MG PO TABS: 10.0000 mg | ORAL_TABLET | Freq: Three times a day (TID) | ORAL | Status: DC | PRN

## 2015-04-08 NOTE — Progress Notes (Signed)
[facility] [address], [place] [address] [phone]  Date:  04/08/2015   Name:  Aaron [name]   DOB:  August 20, 1951   MRN:  [id number]  PCP:  No PCP Per Patient    History of Present Illness:  Aaron [name] is a 64 y.o. male patient who presents to [facility] for follow up of right shoulder pain.  Patient was seen here at [facility] after a fall to his right side as he was walking down to his basement.  XR was insignificant and he was placed on naproxen 550, and tramadol.  Patient states that he continues to have the shoulder pain at his right upper arm, and at the right side of his back.  It is aggravated by extending his arms, and any movement.  It feels tight at times.  There is no numbness or tingling at his upper extremity.  He became more concerned when he noticed that now he has left shoulder pain.  Describes as sore.  He attempts to sleep more at this side.  He has not used any ice but will take his medication with the pain.    Also, patient had requested for test strips that were not sent, due to needing to rtc for diabetes recheck.  Patient states that he picked up glucsose strip and meter.  FG ranges 90-125, but will occasionally bump to 160.  Last a1c 2 months ago was 6.0.  He is watching his diet and eats a lot of vegetables and protein.  Strays away from carbs.  Exercises 3x per week for 15-20 minute cardio.  1 beer per day.  No changes regarding numbness or tingling.  No diarrhea, vomiting, vision changes, or polyuria.       Patient Active Problem List   Diagnosis Date Noted  . Type II or unspecified type diabetes mellitus without mention of complication, not stated as uncontrolled 04/06/2014    Past Medical History  Diagnosis Date  . Diabetes mellitus without complication     History reviewed. No pertinent past surgical history.  History  Substance Use Topics  . Smoking status: Former Research scientist (life sciences)  . Smokeless tobacco: Never Used  . Alcohol Use: Yes   Comment: 2-3 beers a week    Family History  Problem Relation Age of Onset  . Cancer Mother   . Cancer Father     Allergies  Allergen Reactions  . Erythromycin Itching    Medication list has been reviewed and updated.  Current Outpatient Prescriptions on File Prior to Visit  Medication Sig Dispense Refill  . Fluocinonide 0.1 % CREA Apply 1 application topically 4 (four) times daily as needed (rash). 120 g 0  . glucose monitoring kit (FREESTYLE) monitoring kit 1 each by Does not apply route as needed for other. 1 each 0  . ONETOUCH DELICA LANCETS FINE MISC TEST BLOOD SUGAR DAILY 100 each 2  . glucose blood (ONE TOUCH ULTRA TEST) test strip Test blood sugar daily. Dx code: E11.9 100 each 3  . metFORMIN (GLUCOPHAGE XR) 500 MG 24 hr tablet Take 2 tablets (1,000 mg total) by mouth at bedtime. 180 tablet 3   No current facility-administered medications on file prior to visit.    ROS ROS otherwise unremarkable unless listed above.    Physical Examination: BP 138/84 mmHg  Pulse 84  Temp(Src) 98.8 F (37.1 C) (Oral)  Resp 16  Ht '5\' 6"'  (1.676 m)  Wt 199 lb (90.266 kg)  BMI 32.13  kg/m2  SpO2 98% Ideal Body Weight: Weight in (lb) to have BMI = 25: 154.6  Physical Exam  Constitutional: He is oriented to person, place, and time. He appears well-developed and well-nourished.  Eyes: Conjunctivae and EOM are normal. Pupils are equal, round, and reactive to light.  Cardiovascular: Normal rate.   Pulmonary/Chest: Effort normal and breath sounds normal. No respiratory distress.  Musculoskeletal:       Right shoulder: He exhibits tenderness (with external rotation.  +Hawkins, +Neers, tendreness along the glenohumeral joint). He exhibits normal range of motion, no swelling and no spasm (Tenderness along scapula.  No winging.  ).  Neurological: He is alert and oriented to person, place, and time.  Skin: Skin is warm and dry.  Psychiatric: He has a normal mood and affect. His behavior is  normal.     Assessment and Plan: 64 year old male is here today for follow up of shoulder pain and diabetes recheck.  At this time ortho is appreciated.  I have issued flexeril at this time, and berbally and in handout, given daily rehabilitation shoulder exercises to perform.  Diff dx. Rotator cuff injury, GH, SLAP, bursitis, impingement.  Possible PT or further imaging may be needed at this time.  Diabetes is followed and was seen 2 months ago.  a1c is satisfactory then.  He has appointment for 04/29/2015 for physical where a1c can be rechecked.  He does not want test strips, and does not need a med refill at this time.  Appropriate to refill within the the next 6 months.    1. Right shoulder pain - AMB referral to orthopedics - cyclobenzaprine (FLEXERIL) 10 MG tablet; Take 1 tablet (10 mg total) by mouth 3 (three) times daily as needed for muscle spasms.  Dispense: 30 tablet; Refill: 0  2. Mid back pain on right side - cyclobenzaprine (FLEXERIL) 10 MG tablet; Take 1 tablet (10 mg total) by mouth 3 (three) times daily as needed for muscle spasms.  Dispense: 30 tablet; Refill: 0  3. Left shoulder pain  - cyclobenzaprine (FLEXERIL) 10 MG tablet; Take 1 tablet (10 mg total) by mouth 3 (three) times daily as needed for muscle spasms.  Dispense: 30 tablet; Refill: 0  4. Type 2 diabetes mellitus with diabetic polyneuropathy    Ivar Drape, PA-C [facility] 04/08/2015 6:07 PM

## 2015-04-08 NOTE — Patient Instructions (Signed)
Please await phone call from referrals for the appointment for ortho.   Please take the medication at night only after today, as this is sedative.   Shoulder Exercises EXERCISES  RANGE OF MOTION (ROM) AND STRETCHING EXERCISES These exercises may help you when beginning to rehabilitate your injury. Your symptoms may resolve with or without further involvement from your physician, physical therapist or athletic trainer. While completing these exercises, remember:   Restoring tissue flexibility helps normal motion to return to the joints. This allows healthier, less painful movement and activity.  An effective stretch should be held for at least 30 seconds.  A stretch should never be painful. You should only feel a gentle lengthening or release in the stretched tissue. ROM - Pendulum  Bend at the waist so that your right / left arm falls away from your body. Support yourself with your opposite hand on a solid surface, such as a table or a countertop.  Your right / left arm should be perpendicular to the ground. If it is not perpendicular, you need to lean over farther. Relax the muscles in your right / left arm and shoulder as much as possible.  Gently sway your hips and trunk so they move your right / left arm without any use of your right / left shoulder muscles.  Progress your movements so that your right / left arm moves side to side, then forward and backward, and finally, both clockwise and counterclockwise.  Complete __________ repetitions in each direction. Many people use this exercise to relieve discomfort in their shoulder as well as to gain range of motion. Repeat __________ times. Complete this exercise __________ times per day. STRETCH - Flexion, Standing  Stand with good posture. With an underhand grip on your right / left hand and an overhand grip on the opposite hand, grasp a broomstick or cane so that your hands are a little more than shoulder-width apart.  Keeping your  right / left elbow straight and shoulder muscles relaxed, push the stick with your opposite hand to raise your right / left arm in front of your body and then overhead. Raise your arm until you feel a stretch in your right / left shoulder, but before you have increased shoulder pain.  Try to avoid shrugging your right / left shoulder as your arm rises by keeping your shoulder blade tucked down and toward your mid-back spine. Hold __________ seconds.  Slowly return to the starting position. Repeat __________ times. Complete this exercise __________ times per day. STRETCH - Internal Rotation  Place your right / left hand behind your back, palm-up.  Throw a towel or belt over your opposite shoulder. Grasp the towel/belt with your right / left hand.  While keeping an upright posture, gently pull up on the towel/belt until you feel a stretch in the front of your right / left shoulder.  Avoid shrugging your right / left shoulder as your arm rises by keeping your shoulder blade tucked down and toward your mid-back spine.  Hold __________. Release the stretch by lowering your opposite hand. Repeat __________ times. Complete this exercise __________ times per day. STRETCH - External Rotation and Abduction  Stagger your stance through a doorframe. It does not matter which foot is forward.  As instructed by your physician, physical therapist or athletic trainer, place your hands:  And forearms above your head and on the door frame.  And forearms at head-height and on the door frame.  At elbow-height and on the door frame.  Keeping your head and chest upright and your stomach muscles tight to prevent over-extending your low-back, slowly shift your weight onto your front foot until you feel a stretch across your chest and/or in the front of your shoulders.  Hold __________ seconds. Shift your weight to your back foot to release the stretch. Repeat __________ times. Complete this stretch  __________ times per day.  STRENGTHENING EXERCISES  These exercises may help you when beginning to rehabilitate your injury. They may resolve your symptoms with or without further involvement from your physician, physical therapist or athletic trainer. While completing these exercises, remember:   Muscles can gain both the endurance and the strength needed for everyday activities through controlled exercises.  Complete these exercises as instructed by your physician, physical therapist or athletic trainer. Progress the resistance and repetitions only as guided.  You may experience muscle soreness or fatigue, but the pain or discomfort you are trying to eliminate should never worsen during these exercises. If this pain does worsen, stop and make certain you are following the directions exactly. If the pain is still present after adjustments, discontinue the exercise until you can discuss the trouble with your clinician.  If advised by your physician, during your recovery, avoid activity or exercises which involve actions that place your right / left hand or elbow above your head or behind your back or head. These positions stress the tissues which are trying to heal. STRENGTH - Scapular Depression and Adduction  With good posture, sit on a firm chair. Supported your arms in front of you with pillows, arm rests or a table top. Have your elbows in line with the sides of your body.  Gently draw your shoulder blades down and toward your mid-back spine. Gradually increase the tension without tensing the muscles along the top of your shoulders and the back of your neck.  Hold for __________ seconds. Slowly release the tension and relax your muscles completely before completing the next repetition.  After you have practiced this exercise, remove the arm support and complete it in standing as well as sitting. Repeat __________ times. Complete this exercise __________ times per day.  STRENGTH - External  Rotators  Secure a rubber exercise band/tubing to a fixed object so that it is at the same height as your right / left elbow when you are standing or sitting on a firm surface.  Stand or sit so that the secured exercise band/tubing is at your side that is not injured.  Bend your elbow 90 degrees. Place a folded towel or small pillow under your right / left arm so that your elbow is a few inches away from your side.  Keeping the tension on the exercise band/tubing, pull it away from your body, as if pivoting on your elbow. Be sure to keep your body steady so that the movement is only coming from your shoulder rotating.  Hold __________ seconds. Release the tension in a controlled manner as you return to the starting position. Repeat __________ times. Complete this exercise __________ times per day.  STRENGTH - Supraspinatus  Stand or sit with good posture. Grasp a __________ weight or an exercise band/tubing so that your hand is "thumbs-up," like when you shake hands.  Slowly lift your right / left hand from your thigh into the air, traveling about 30 degrees from straight out at your side. Lift your hand to shoulder height or as far as you can without increasing any shoulder pain. Initially, many people do not lift  their hands above shoulder height.  Avoid shrugging your right / left shoulder as your arm rises by keeping your shoulder blade tucked down and toward your mid-back spine.  Hold for __________ seconds. Control the descent of your hand as you slowly return to your starting position. Repeat __________ times. Complete this exercise __________ times per day.  STRENGTH - Shoulder Extensors  Secure a rubber exercise band/tubing so that it is at the height of your shoulders when you are either standing or sitting on a firm arm-less chair.  With a thumbs-up grip, grasp an end of the band/tubing in each hand. Straighten your elbows and lift your hands straight in front of you at shoulder  height. Step back away from the secured end of band/tubing until it becomes tense.  Squeezing your shoulder blades together, pull your hands down to the sides of your thighs. Do not allow your hands to go behind you.  Hold for __________ seconds. Slowly ease the tension on the band/tubing as you reverse the directions and return to the starting position. Repeat __________ times. Complete this exercise __________ times per day.  STRENGTH - Scapular Retractors  Secure a rubber exercise band/tubing so that it is at the height of your shoulders when you are either standing or sitting on a firm arm-less chair.  With a palm-down grip, grasp an end of the band/tubing in each hand. Straighten your elbows and lift your hands straight in front of you at shoulder height. Step back away from the secured end of band/tubing until it becomes tense.  Squeezing your shoulder blades together, draw your elbows back as you bend them. Keep your upper arm lifted away from your body throughout the exercise.  Hold __________ seconds. Slowly ease the tension on the band/tubing as you reverse the directions and return to the starting position. Repeat __________ times. Complete this exercise __________ times per day. STRENGTH - Scapular Depressors  Find a sturdy chair without wheels, such as a from a dining room table.  Keeping your feet on the floor, lift your bottom from the seat and lock your elbows.  Keeping your elbows straight, allow gravity to pull your body weight down. Your shoulders will rise toward your ears.  Raise your body against gravity by drawing your shoulder blades down your back, shortening the distance between your shoulders and ears. Although your feet should always maintain contact with the floor, your feet should progressively support less body weight as you get stronger.  Hold __________ seconds. In a controlled and slow manner, lower your body weight to begin the next repetition. Repeat  __________ times. Complete this exercise __________ times per day.  Document Released: 07/01/2005 Document Revised: 11/09/2011 Document Reviewed: 11/29/2008 [facility] Patient Information 2015 [place], [place]. This information is not intended to replace advice given to you by your health care provider. Make sure you discuss any questions you have with your health care provider.

## 2015-04-22 ENCOUNTER — Telehealth: Payer: Self-pay

## 2015-04-22 NOTE — Telephone Encounter (Signed)
Pt is needing to talk with someone about his blood suger levels and taking the predizone and does he need to increase his metphormin or let the predizone get out of his sys

## 2015-04-22 NOTE — Telephone Encounter (Signed)
His last A1C was 6.0 3 months ago which is a great range for a diabetic. He should be able to tolerate the prednisone just fine without increasing his metformin dose. Have his sugars spiked significantly at home?

## 2015-04-22 NOTE — Telephone Encounter (Signed)
Would this be an option since Prednisone can increase sugar levels?

## 2015-04-23 NOTE — Telephone Encounter (Signed)
See below

## 2015-04-23 NOTE — Telephone Encounter (Signed)
This is a temporary reaction to steroid therapy. Advise that he call us if he sugar is greater than 300 for two consecutive readings, or if he is becomes symptomatic.  Philis Fendt, MS, PA-C   9:28 PM, 04/23/2015

## 2015-04-23 NOTE — Telephone Encounter (Signed)
Patient returned call. He checks his glucose twice daily, once am and once pm. He started prednisone taper, prescribed by orthopedic, which we referred too, Saturday 04/20/15. Rx was prednisone 5mg  3,3,3,2,2,2,1,1,1 with directions to take 1 pill in the am 1 at lunch and 1 in pm and so on. Please advise if anything should be changed with details provided.  Sat 08/20 pm: 204 Sun 08/21 am: 153 Sun 08/21 pm: 224 Mon 08/22 am: 171 Mon 08/22 pm: 231 Tues 08/23 am: 180

## 2015-04-23 NOTE — Telephone Encounter (Signed)
Patient notified and voiced understanding.

## 2015-04-23 NOTE — Telephone Encounter (Signed)
Left VM to call back 

## 2015-04-29 ENCOUNTER — Encounter: Payer: Self-pay | Admitting: Physician Assistant

## 2015-04-29 ENCOUNTER — Ambulatory Visit (INDEPENDENT_AMBULATORY_CARE_PROVIDER_SITE_OTHER): Payer: Managed Care, Other (non HMO) | Admitting: Physician Assistant

## 2015-04-29 VITALS — BP 126/86 | HR 83 | Temp 98.1°F | Resp 16 | Ht 65.5 in | Wt 190.6 lb

## 2015-04-29 DIAGNOSIS — Z139 Encounter for screening, unspecified: Secondary | ICD-10-CM | POA: Diagnosis not present

## 2015-04-29 DIAGNOSIS — Z23 Encounter for immunization: Secondary | ICD-10-CM | POA: Diagnosis not present

## 2015-04-29 DIAGNOSIS — Z418 Encounter for other procedures for purposes other than remedying health state: Secondary | ICD-10-CM

## 2015-04-29 DIAGNOSIS — Z299 Encounter for prophylactic measures, unspecified: Secondary | ICD-10-CM

## 2015-04-29 DIAGNOSIS — Z760 Encounter for issue of repeat prescription: Secondary | ICD-10-CM | POA: Diagnosis not present

## 2015-04-29 LAB — CBC WITH DIFFERENTIAL/PLATELET
Basophils Absolute: 0 10*3/uL (ref 0.0–0.1)
Basophils Relative: 0 % (ref 0–1)
Eosinophils Absolute: 0.1 10*3/uL (ref 0.0–0.7)
Eosinophils Relative: 1 % (ref 0–5)
HCT: 49.5 % (ref 39.0–52.0)
Hemoglobin: 16.3 g/dL (ref 13.0–17.0)
Lymphocytes Relative: 24 % (ref 12–46)
Lymphs Abs: 1.8 10*3/uL (ref 0.7–4.0)
MCH: 27.3 pg (ref 26.0–34.0)
MCHC: 32.9 g/dL (ref 30.0–36.0)
MCV: 83.1 fL (ref 78.0–100.0)
MPV: 10.1 fL (ref 8.6–12.4)
Monocytes Absolute: 0.6 10*3/uL (ref 0.1–1.0)
Monocytes Relative: 8 % (ref 3–12)
Neutro Abs: 5.2 10*3/uL (ref 1.7–7.7)
Neutrophils Relative %: 67 % (ref 43–77)
Platelets: 292 10*3/uL (ref 150–400)
RBC: 5.96 MIL/uL — ABNORMAL HIGH (ref 4.22–5.81)
RDW: 14.2 % (ref 11.5–15.5)
WBC: 7.7 10*3/uL (ref 4.0–10.5)

## 2015-04-29 LAB — COMPLETE METABOLIC PANEL WITH GFR
ALT: 18 U/L (ref 9–46)
AST: 14 U/L (ref 10–35)
Albumin: 3.9 g/dL (ref 3.6–5.1)
Alkaline Phosphatase: 72 U/L (ref 40–115)
BUN: 18 mg/dL (ref 7–25)
CO2: 24 mmol/L (ref 20–31)
Calcium: 8.8 mg/dL (ref 8.6–10.3)
Chloride: 104 mmol/L (ref 98–110)
Creat: 0.71 mg/dL (ref 0.70–1.25)
GFR, Est African American: >89 mL/min (ref >=60)
GFR, Est Non African American: >89 mL/min (ref >=60)
Glucose, Bld: 147 mg/dL — ABNORMAL HIGH (ref 65–99)
Potassium: 4.3 mmol/L (ref 3.5–5.3)
Sodium: 141 mmol/L (ref 135–146)
Total Bilirubin: 0.7 mg/dL (ref 0.2–1.2)
Total Protein: 6.5 g/dL (ref 6.1–8.1)

## 2015-04-29 LAB — LIPID PANEL
Cholesterol: 147 mg/dL (ref 125–200)
HDL: 38 mg/dL — ABNORMAL LOW (ref >=40)
LDL Cholesterol: 82 mg/dL (ref <130)
Total CHOL/HDL Ratio: 3.9 Ratio (ref <=5.0)
Triglycerides: 136 mg/dL (ref <150)
VLDL: 27 mg/dL (ref <30)

## 2015-04-29 LAB — POCT URINALYSIS DIPSTICK
Bilirubin, UA: NEGATIVE
Blood, UA: NEGATIVE
Glucose, UA: NEGATIVE
Leukocytes, UA: NEGATIVE
Nitrite, UA: NEGATIVE
Protein, UA: NEGATIVE
Spec Grav, UA: 1.025
Urobilinogen, UA: 0.2
pH, UA: 5

## 2015-04-29 LAB — POCT UA - MICROSCOPIC ONLY
Bacteria, U Microscopic: NEGATIVE
Casts, Ur, LPF, POC: NEGATIVE
Crystals, Ur, HPF, POC: NEGATIVE
Epithelial cells, urine per micros: NEGATIVE
Mucus, UA: NEGATIVE
RBC, urine, microscopic: NEGATIVE
WBC, Ur, HPF, POC: NEGATIVE

## 2015-04-29 LAB — TSH: TSH: 2.513 u[IU]/mL (ref 0.350–4.500)

## 2015-04-29 LAB — POCT GLYCOSYLATED HEMOGLOBIN (HGB A1C): Hemoglobin A1C: 6.2

## 2015-04-29 MED ORDER — LISINOPRIL 5 MG PO TABS: 5.0000 mg | ORAL_TABLET | Freq: Every day | ORAL | Status: DC

## 2015-04-29 MED ORDER — METFORMIN HCL ER 500 MG PO TB24: 500.0000 mg | ORAL_TABLET | Freq: Every day | ORAL | Status: DC

## 2015-04-29 NOTE — Progress Notes (Signed)
04/30/2015 at 8:44 AM  Aaron [name] / DOB: 08-23-1951 / MRN: [id number]  The patient has Type II or unspecified type diabetes mellitus without mention of complication, not stated as uncontrolled on his problem list.  SUBJECTIVE  Aaron [name] is a 64 y.o. well appearing male with a history of DM2 presenting for the chief complaint of need for an annual physical. He feels well today.  Denies dysthymic mood and anhedonia. He does yard work daily for 20-40 minutes.   His most recent colonoscopy was in 2009 and per [facility] he is due back in 2019.    He has been taking 81 mg ASA 3-4 years.     With regard to prostate screening he declines this today after discussion of the risk versus benefit.    Immunization History  Administered Date(s) Administered  . Influenza,inj,Quad PF,36+ Mos 06/28/2014, 04/29/2015  . Pneumococcal Conjugate-13 04/29/2015  . Pneumococcal Polysaccharide-23 04/06/2014    He  has a past medical history of Diabetes mellitus without complication.    Medications reviewed and updated by myself where necessary, and exist elsewhere in the encounter.   Aaron [name] is allergic to erythromycin. He  reports that he has quit smoking. He has never used smokeless tobacco. He reports that he drinks alcohol. He reports that he does not use illicit drugs. He  reports that he currently engages in sexual activity. The patient  has no past surgical history on file.  His family history includes Cancer in his father and mother.  Review of Systems  Constitutional: Negative for fever and chills.  Respiratory: Negative for shortness of breath.   Cardiovascular: Negative for chest pain.  Gastrointestinal: Negative for nausea and abdominal pain.  Genitourinary: Negative.   Musculoskeletal: Negative for myalgias.  Skin: Negative for rash.  Neurological: Negative for dizziness and headaches.    OBJECTIVE  His  height is 5' 5.5" (1.664 m) and weight is 190 lb 9.6 oz (86.456 kg). His  oral temperature is 98.1 F (36.7 C). His blood pressure is 126/86 and his pulse is 83. His respiration is 16 and oxygen saturation is 97%.  The patient's body mass index is 31.22 kg/(m^2).  Physical Exam  Vitals reviewed. Constitutional: He is oriented to person, place, and time. He appears well-developed. No distress.  Eyes: EOM are normal. Pupils are equal, round, and reactive to light. No scleral icterus.  Neck: Normal range of motion.  Cardiovascular: Normal rate and regular rhythm.   Respiratory: Effort normal and breath sounds normal.  GI: He exhibits no distension.  Musculoskeletal: Normal range of motion.  Neurological: He is alert and oriented to person, place, and time. No cranial nerve deficit.  Skin: Skin is warm and dry. No rash noted. He is not diaphoretic.  Psychiatric: He has a normal mood and affect.    Results for orders placed or performed in visit on 04/29/15 (from the past 24 hour(s))  CBC with Differential/Platelet     Status: Abnormal   Collection Time: 04/29/15  1:39 PM  Result Value Ref Range   WBC 7.7 4.0 - 10.5 K/uL   RBC 5.96 (H) 4.22 - 5.81 MIL/uL   Hemoglobin 16.3 13.0 - 17.0 g/dL   HCT 49.5 39.0 - 52.0 %   MCV 83.1 78.0 - 100.0 fL   MCH 27.3 26.0 - 34.0 pg   MCHC 32.9 30.0 - 36.0 g/dL   RDW 14.2 11.5 - 15.5 %   Platelets 292 150 - 400 K/uL   MPV  10.1 8.6 - 12.4 fL   Neutrophils Relative % 67 43 - 77 %   Neutro Abs 5.2 1.7 - 7.7 K/uL   Lymphocytes Relative 24 12 - 46 %   Lymphs Abs 1.8 0.7 - 4.0 K/uL   Monocytes Relative 8 3 - 12 %   Monocytes Absolute 0.6 0.1 - 1.0 K/uL   Eosinophils Relative 1 0 - 5 %   Eosinophils Absolute 0.1 0.0 - 0.7 K/uL   Basophils Relative 0 0 - 1 %   Basophils Absolute 0.0 0.0 - 0.1 K/uL   Smear Review Criteria for review not met    Narrative   Performed at:  [address], [address]                [place], [place] [address]  TSH     Status: None   Collection Time: 04/29/15   1:39 PM  Result Value Ref Range   TSH 2.513 0.350 - 4.500 uIU/mL   Narrative   Performed at:  [address]                [place], [place] [address]  COMPLETE METABOLIC PANEL WITH GFR     Status: Abnormal   Collection Time: 04/29/15  1:39 PM  Result Value Ref Range   Sodium 141 135 - 146 mmol/L   Potassium 4.3 3.5 - 5.3 mmol/L   Chloride 104 98 - 110 mmol/L   CO2 24 20 - 31 mmol/L   Glucose, Bld 147 (H) 65 - 99 mg/dL   BUN 18 7 - 25 mg/dL   Creat 0.71 0.70 - 1.25 mg/dL   Total Bilirubin 0.7 0.2 - 1.2 mg/dL   Alkaline Phosphatase 72 40 - 115 U/L   AST 14 10 - 35 U/L   ALT 18 9 - 46 U/L   Total Protein 6.5 6.1 - 8.1 g/dL   Albumin 3.9 3.6 - 5.1 g/dL   Calcium 8.8 8.6 - 10.3 mg/dL   GFR, Est African American >89 >=60 mL/min   GFR, Est Non African American >89 >=60 mL/min   Narrative   Performed at:  [address]                [place], [place] [address]  Lipid panel     Status: Abnormal   Collection Time: 04/29/15  1:39 PM  Result Value Ref Range   Cholesterol 147 125 - 200 mg/dL   Triglycerides 136 <150 mg/dL   HDL 38 (L) >=40 mg/dL   Total CHOL/HDL Ratio 3.9 <=5.0 Ratio   VLDL 27 <30 mg/dL   LDL Cholesterol 82 <130 mg/dL   Narrative   Performed at:  [facility]                [address], [address]                [place], [place] [address]  POCT glycosylated hemoglobin (Hb A1C)     Status: None   Collection Time: 04/29/15  2:00 PM  Result Value Ref Range   Hemoglobin A1C 6.2   POCT urinalysis dipstick     Status: None  Collection Time: 04/29/15  2:01 PM  Result Value Ref Range   Color, UA Yellow    Clarity, UA Clear    Glucose, UA Negative    Bilirubin, UA Negative    Ketones, UA Trace    Spec Grav, UA 1.025    Blood, UA Negative    pH, UA 5.0    Protein, UA negative    Urobilinogen, UA 0.2    Nitrite, UA Negative    Leukocytes, UA Negative Negative    POCT UA - Microscopic Only     Status: None   Collection Time: 04/29/15  2:02 PM  Result Value Ref Range   WBC, Ur, HPF, POC Negative    RBC, urine, microscopic Negative    Bacteria, U Microscopic Negative    Mucus, UA Negative    Epithelial cells, urine per micros Negative    Crystals, Ur, HPF, POC Negative    Casts, Ur, LPF, POC Negative    Yeast, UA      ASSESSMENT & PLAN  Starsky was seen today for annual exam.  Diagnoses and all orders for this visit:  Screening -     CBC with Differential/Platelet -     TSH -     POCT urinalysis dipstick -     POCT UA - Microscopic Only -     POCT glycosylated hemoglobin (Hb A1C) -     COMPLETE METABOLIC PANEL WITH GFR -     Lipid panel  Need for immunization against influenza -     Flu Vaccine QUAD 36+ mos IM  Need for pneumococcal vaccination -     Pneumococcal conjugate vaccine 13-valent IM  Prophylactic measure: Patient with 15% risk per ASCVD.  He is a diabetic and would benefit from the renal protection provided by ACE inhibitors.  Will initiate a low dose.  Will hold off of statin therapy for now.  He will follow up in 6 months.   -     lisinopril (PRINIVIL,ZESTRIL) 5 MG tablet; Take 1 tablet (5 mg total) by mouth daily.  Medicine refill -     metFORMIN (GLUCOPHAGE XR) 500 MG 24 hr tablet; Take 1 tablet (500 mg total) by mouth at bedtime.    The patient was advised to call or come back to clinic if he does not see an improvement in symptoms, or worsens with the above plan.   Philis Fendt, MHS, PA-C [facility] 04/30/2015 8:44 AM

## 2015-04-29 NOTE — Patient Instructions (Signed)
Exercise improves every system in the body.  It lowers the risk of heart disease, decreases blood pressure, reduces the symptoms of depression and anxiety, and lowers blood sugar. To receive these benefits, try to get 150 minutes of planned exercise each week.  You can break this 150 minutes up however you like.  For instance, you can perform 30 minutes of brisk walking 5 days a week, or perform 50 minutes 3 days a week.  If you don't like walking, or can't find a safe place to walk, find another way to move that you can enjoy.  Exercise tapes, cycling, stair climbing, swimming, or a combination will be just as good as a walking program. To ensure the proper intensity, you can use the talk test. Essentially, you should be able to carry on a conversation, but you should have to take short breaks from the conversation in order catch your breath.

## 2015-04-29 NOTE — Progress Notes (Deleted)
   Subjective:    Patient ID: Aaron [name], male    DOB: [birth year], 64 y.o.   MRN: [id number]  HPI    Review of Systems  Constitutional: Positive for fatigue.  HENT: Positive for tinnitus.   Eyes: Positive for redness.  Respiratory: Negative.   Cardiovascular: Negative.   Gastrointestinal: Negative.   Endocrine: Negative.   Genitourinary: Positive for frequency.  Musculoskeletal: Positive for myalgias, back pain and arthralgias.  Skin: Negative.   Allergic/Immunologic: Negative.   Neurological: Negative.   Hematological: Negative.   Psychiatric/Behavioral: Negative.        Objective:   Physical Exam        Assessment & Plan:

## 2015-04-30 ENCOUNTER — Encounter: Payer: Self-pay | Admitting: Physician Assistant

## 2015-05-05 ENCOUNTER — Telehealth: Payer: Self-pay

## 2015-05-05 NOTE — Telephone Encounter (Signed)
Patient called in stating that Philis Fendt had prescribed him lisinopril (PRINIVIL,ZESTRIL) 5 MG tablet and he wanted to let him know that it wasn't working for him because it is keeping him awake at night and making him feel funny. He stated that he wasn't sure it was this medication so he stopped taking it for a day and could sleep fine at night, so he took it again last night 05/04/15 and could not sleep and just felt weird. He would like something else called into the [facility] at [address] and [address].   His call back number is [phone]

## 2015-05-06 ENCOUNTER — Other Ambulatory Visit: Payer: Self-pay | Admitting: Physician Assistant

## 2015-05-06 MED ORDER — LOSARTAN POTASSIUM 25 MG PO TABS: 25.0000 mg | ORAL_TABLET | Freq: Every day | ORAL | Status: DC

## 2015-05-06 NOTE — Telephone Encounter (Signed)
Let's try Losartan. It offers similar protections to diabetics as would lisinopril.  I will send to the pharmacy. Philis Fendt, MS, PA-C   2:45 PM, 05/06/2015

## 2015-05-08 NOTE — Telephone Encounter (Signed)
Left message for pt to call back  °

## 2015-05-08 NOTE — Progress Notes (Unsigned)
Spoke with patient and notified new med at pharmacy.  Patient would like a call back about his lab results.

## 2015-05-09 NOTE — Telephone Encounter (Signed)
Spoke with wife advised message.

## 2015-05-09 NOTE — Telephone Encounter (Signed)
Legrand Como can you look at the labs.

## 2015-05-10 NOTE — Telephone Encounter (Signed)
His labs look great.  Please call and advise that he continue the plan discussed in the office.  Philis Fendt, MS, PA-C   2:51 PM, 05/10/2015 \

## 2015-05-15 NOTE — Telephone Encounter (Signed)
Left message on voicemail advising pt.

## 2015-06-10 ENCOUNTER — Ambulatory Visit (INDEPENDENT_AMBULATORY_CARE_PROVIDER_SITE_OTHER): Payer: Managed Care, Other (non HMO)

## 2015-06-10 ENCOUNTER — Ambulatory Visit (INDEPENDENT_AMBULATORY_CARE_PROVIDER_SITE_OTHER): Payer: Managed Care, Other (non HMO) | Admitting: Family Medicine

## 2015-06-10 VITALS — BP 114/72 | HR 94 | Temp 98.9°F | Resp 16 | Ht 66.5 in | Wt 196.0 lb

## 2015-06-10 DIAGNOSIS — Y92099 Unspecified place in other non-institutional residence as the place of occurrence of the external cause: Secondary | ICD-10-CM

## 2015-06-10 DIAGNOSIS — M25561 Pain in right knee: Secondary | ICD-10-CM | POA: Diagnosis not present

## 2015-06-10 DIAGNOSIS — W19XXXA Unspecified fall, initial encounter: Secondary | ICD-10-CM | POA: Diagnosis not present

## 2015-06-10 DIAGNOSIS — Y92009 Unspecified place in unspecified non-institutional (private) residence as the place of occurrence of the external cause: Secondary | ICD-10-CM

## 2015-06-10 DIAGNOSIS — S83411A Sprain of medial collateral ligament of right knee, initial encounter: Secondary | ICD-10-CM | POA: Diagnosis not present

## 2015-06-10 MED ORDER — HYDROCODONE-ACETAMINOPHEN 5-325 MG PO TABS: 1.0000 | ORAL_TABLET | Freq: Four times a day (QID) | ORAL | Status: DC | PRN

## 2015-06-10 MED ORDER — DICLOFENAC SODIUM 75 MG PO TBEC: 75.0000 mg | DELAYED_RELEASE_TABLET | Freq: Two times a day (BID) | ORAL | Status: DC

## 2015-06-10 NOTE — Patient Instructions (Addendum)
Wear the knee brace whenever you are going to be up and around  Plan to try and ice it for about 10 or 15 minutes several times daily the next few days  Take the diclofenac one twice daily for pain and inflammation  Take the hydrocodone pain pills one every 4-6 hours as needed for pain  Usual crutches if it is seeming to give you to much discomfort  Stay off of work through tomorrow  Plan to return in one week, sooner if needed  It is okay to take Tylenol (acetaminophen) in addition to the diclofenac, but do not take Aleve or ibuprofen.  The hydrocodone is combined with a little bit of acetaminophen, and you should count it in figuring your total acetaminophen dose that you have had in 24 hours. You should not exceed 3000 mg of acetaminophen in 24 hours
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
## 2015-08-01 ENCOUNTER — Ambulatory Visit (INDEPENDENT_AMBULATORY_CARE_PROVIDER_SITE_OTHER): Payer: Managed Care, Other (non HMO) | Admitting: Family Medicine

## 2015-08-01 VITALS — BP 132/70 | HR 77 | Temp 98.6°F | Resp 16 | Ht 66.0 in | Wt 199.0 lb

## 2015-08-01 DIAGNOSIS — D229 Melanocytic nevi, unspecified: Secondary | ICD-10-CM

## 2015-08-01 DIAGNOSIS — L819 Disorder of pigmentation, unspecified: Secondary | ICD-10-CM

## 2015-08-01 NOTE — Patient Instructions (Signed)
Keep bandage in place for the next 24 hours; you can remove bandage at that time. Gently wash wound with soap and water each day. Return for pain, redness, drainage, ongoing bleeding.

## 2015-08-01 NOTE — Progress Notes (Signed)
Subjective:    Patient ID: CLETIS CLACK, male    DOB: 09/14/1950, 64 y.o.   MRN: [id number]  08/01/2015  Other   HPI This 64 y.o. male presents for evaluation of bleedin gmole.  Present since childhood.  Bleeding for two days. Wife is applying neosporin for two days and wound is still bleeding.   PCP: [facility]   Review of Systems  Constitutional: Negative for fever, chills, diaphoresis and fatigue.  Skin: Positive for color change and wound. Negative for pallor and rash.    Past Medical History  Diagnosis Date  . Diabetes mellitus without complication ([place])    History reviewed. No pertinent past surgical history. Allergies  Allergen Reactions  . Erythromycin Itching   Current Outpatient Prescriptions  Medication Sig Dispense Refill  . glucose blood (ONE TOUCH ULTRA TEST) test strip Test blood sugar daily. Dx code: E11.9 100 each 3  . glucose monitoring kit (FREESTYLE) monitoring kit 1 each by Does not apply route as needed for other. 1 each 0  . losartan (COZAAR) 25 MG tablet Take 1 tablet (25 mg total) by mouth daily. 30 tablet 5  . metFORMIN (GLUCOPHAGE XR) 500 MG 24 hr tablet Take 1 tablet (500 mg total) by mouth at bedtime. 90 tablet 1  . ONETOUCH DELICA LANCETS FINE MISC TEST BLOOD SUGAR DAILY 100 each 2   No current facility-administered medications for this visit.   Social History   Social History  . Marital Status: Married    Spouse Name: N/A  . Number of Children: N/A  . Years of Education: N/A   Occupational History  . Sales    Social History Main Topics  . Smoking status: Former Research scientist (life sciences)  . Smokeless tobacco: Never Used  . Alcohol Use: 0.0 oz/week    0 Standard drinks or equivalent per week     Comment: 2-3 beers a week  . Drug Use: No  . Sexual Activity: Yes   Other Topics Concern  . Not on file   Social History Narrative   Married. Education: [employer].   Family History  Problem Relation Age of Onset  . Cancer Mother   . Cancer Father          Objective:    BP 132/70 mmHg  Pulse 77  Temp(Src) 98.6 F (37 C)  Resp 16  Ht '5\' 6"'  (1.676 m)  Wt 199 lb (90.266 kg)  BMI 32.13 kg/m2  SpO2 96% Physical Exam  Constitutional: He is oriented to person, place, and time. He appears well-developed and well-nourished. No distress.  HENT:  Head: Normocephalic and atraumatic.  Eyes: Conjunctivae and EOM are normal. Pupils are equal, round, and reactive to light.  Neck: Normal range of motion. Neck supple. Carotid bruit is not present. No thyromegaly present.  Cardiovascular: Normal rate, regular rhythm, normal heart sounds and intact distal pulses.  Exam reveals no gallop and no friction rub.   No murmur heard. Pulmonary/Chest: Effort normal and breath sounds normal. He has no wheezes. He has no rales.  Lymphadenopathy:    He has no cervical adenopathy.  Neurological: He is alert and oriented to person, place, and time. No cranial nerve deficit.  Skin: Skin is warm and dry. No rash noted. He is not diaphoretic.  +upper back with pedunculated skin lesion on small stalk with erythema, bleeding mild.  No ulceration.  Psychiatric: He has a normal mood and affect. His behavior is normal.  Nursing note and vitals reviewed.   PROCEDURE NOTE:  VERBAL CONSENT OBTAINED; 2% LIDOCAINE WITH EPI 0.25CC ADMINISTERED UNDER SKIN LESION; SHAVE BIOPSY PERFORMED; SILVER NITRATE APPLIED TO AREA WITH GOOD HEMOSTASIS.  BANDAGE APPLIED.    Assessment & Plan:   1. Bleeding pigmented skin lesion    -New. -Bleeding skin lesion with concern for basal cell carcinoma versus inflamed seborrhea keratosis. -Local wound care reviewed; keep bandage in place for 24 hours.   No orders of the defined types were placed in this encounter.   No orders of the defined types were placed in this encounter.    No Follow-up on file.    Danney Bungert Elayne Guerin, M.D. [facility] [address] [place], [place]  [address] [phone]  phone [phone] fax

## 2015-08-04 ENCOUNTER — Other Ambulatory Visit: Payer: Self-pay | Admitting: Family Medicine

## 2015-08-06 NOTE — Telephone Encounter (Signed)
Aaron [name], can you please check correct dosage of metformin? At 8/29 OV you sent in a new Rx for metformin, 1 Qhs, but I don't see any notes about you changing pt's dosage at that visit, your comment was that you refilled his metformin. At 8/8 OV, pt was taking 2 tablets Qhs. I don't see any notes in between OVs that indicate a change either. Pharm is requesting RFs of the 2 Qhs dose.

## 2015-08-29 ENCOUNTER — Other Ambulatory Visit: Payer: Self-pay | Admitting: Emergency Medicine

## 2015-09-19 ENCOUNTER — Telehealth: Payer: Self-pay | Admitting: Family Medicine

## 2015-09-19 NOTE — Telephone Encounter (Signed)
Call patient --- I sent him a message to his MyChart with his pathology results.  I have included these results in this message:  Notes Recorded by Wardell Honour, MD on 09/03/2015 at 1:58 PM Skin lesion pathology revealed a pyogenic granuloma; no evidence of skin cancer. These lesions can bleed easily and grow quickly. No further work up needed at this time. Happy New Year! Shervon Kerwin Elayne Guerin, M.D. [facility] [address] [place], [place] [address] [phone] phone [phone] fax

## 2015-09-19 NOTE — Telephone Encounter (Signed)
Dr. Tamala Julian,   Mr Buhrman called and stated he never received the results or a call back about the lesion you removed on 08/01/2015.   Can you or someone please call him back at [phone]?  Thank you,

## 2015-09-19 NOTE — Telephone Encounter (Signed)
Left message on machine to call back  

## 2015-09-29 NOTE — Telephone Encounter (Signed)
Lmom to cb.

## 2015-09-30 NOTE — Telephone Encounter (Signed)
lmom to cb. 

## 2015-10-01 NOTE — Telephone Encounter (Signed)
Spoke with Pt. And he was informed

## 2015-10-30 ENCOUNTER — Ambulatory Visit: Payer: Managed Care, Other (non HMO) | Admitting: Physician Assistant

## 2015-10-31 ENCOUNTER — Ambulatory Visit: Payer: Managed Care, Other (non HMO) | Admitting: Physician Assistant

## 2015-10-31 ENCOUNTER — Telehealth: Payer: Self-pay

## 2015-10-31 NOTE — Telephone Encounter (Signed)
PATIENT STATES Aaron [name] PRESCRIBED HIM LOSARTAN 25 MG FOR HIS HIGH BLOOD PRESSURE. IT IS CAUSING HIM TO TOSS AND TURN AT NIGHT AND HAVE NIGHTMARES. HE WANTS TO  KNOW IF HE CAN TAKE IT DURING THE DAY OR DOES IT CAUSE DROWSINESS? BEST PHONE [phone]  (HOME)  [place].  [place]

## 2015-10-31 NOTE — Telephone Encounter (Signed)
Please reveiw

## 2015-11-04 ENCOUNTER — Encounter: Payer: Self-pay | Admitting: Physician Assistant

## 2015-11-04 NOTE — Telephone Encounter (Signed)
Please call him.  He may take this medication at any time.  However, this is an unusual side effect of Losartan and a very low dose.  Please confirm this is the right medication.  Philis Fendt, MS, PA-C 12:06 PM, 11/04/2015

## 2015-11-13 ENCOUNTER — Ambulatory Visit (INDEPENDENT_AMBULATORY_CARE_PROVIDER_SITE_OTHER): Payer: Managed Care, Other (non HMO)

## 2015-11-13 ENCOUNTER — Encounter: Payer: Self-pay | Admitting: Physician Assistant

## 2015-11-13 ENCOUNTER — Ambulatory Visit (INDEPENDENT_AMBULATORY_CARE_PROVIDER_SITE_OTHER): Payer: Managed Care, Other (non HMO) | Admitting: Physician Assistant

## 2015-11-13 VITALS — BP 133/81 | HR 76 | Temp 97.7°F | Resp 16 | Wt 197.8 lb

## 2015-11-13 DIAGNOSIS — M791 Myalgia, unspecified site: Secondary | ICD-10-CM

## 2015-11-13 DIAGNOSIS — Z0000 Encounter for general adult medical examination without abnormal findings: Secondary | ICD-10-CM | POA: Diagnosis not present

## 2015-11-13 DIAGNOSIS — M47812 Spondylosis without myelopathy or radiculopathy, cervical region: Secondary | ICD-10-CM

## 2015-11-13 DIAGNOSIS — Z139 Encounter for screening, unspecified: Secondary | ICD-10-CM

## 2015-11-13 DIAGNOSIS — Z7689 Persons encountering health services in other specified circumstances: Secondary | ICD-10-CM

## 2015-11-13 LAB — POCT GLYCOSYLATED HEMOGLOBIN (HGB A1C): Hemoglobin A1C: 6.5

## 2015-11-13 MED ORDER — ZOSTER VACCINE LIVE 19400 UNT/0.65ML ~~LOC~~ SOLR: 0.6500 mL | Freq: Once | SUBCUTANEOUS | Status: DC

## 2015-11-13 NOTE — Patient Instructions (Signed)
Visit: [email or web address].[email or web address]/topic.cfm?topic=[email or web address]  Degenerative Disk Disease Degenerative disk disease is a condition caused by the changes that occur in spinal disks as you grow older. Spinal disks are soft and compressible disks located between the bones of your spine (vertebrae). These disks act like shock absorbers. Degenerative disk disease can affect the whole spine. However, the neck and lower back are most commonly affected. Many changes can occur in the spinal disks with aging, such as:  The spinal disks may dry and shrink.  Small tears may occur in the tough, outer covering of the disk (annulus).  The disk space may become smaller due to loss of water.  Abnormal growths in the bone (spurs) may occur. This can put pressure on the nerve roots exiting the spinal canal, causing pain.  The spinal canal may become narrowed. RISK FACTORS   Being overweight.  Having a family history of degenerative disk disease.  Smoking.  There is increased risk if you are doing heavy lifting or have a sudden injury. SIGNS AND SYMPTOMS  Symptoms vary from person to person and may include:  Pain that varies in intensity. Some people have no pain, while others have severe pain. The location of the pain depends on the part of your backbone that is affected.  You will have neck or arm pain if a disk in the neck area is affected.  You will have pain in your back, buttocks, or legs if a disk in the lower back is affected.  Pain that becomes worse while bending, reaching up, or with twisting movements.  Pain that may start gradually and then get worse as time passes. It may also start after a major or minor injury.  Numbness or tingling in the arms or legs. DIAGNOSIS  Your health care provider will ask you about your symptoms and about activities or habits that may cause the pain. He or she may also ask about any injuries, diseases, or treatments you have had. Your health care provider will  examine you to check for the range of movement that is possible in the affected area, to check for strength in your extremities, and to check for sensation in the areas of the arms and legs supplied by different nerve roots. You may also have:   An X-ray of the spine.  Other imaging tests, such as MRI. TREATMENT  Your health care provider will advise you on the best plan for treatment. Treatment may include:  Medicines.  Rehabilitation exercises. HOME CARE INSTRUCTIONS   Follow proper lifting and walking techniques as advised by your health care provider.  Maintain good posture.  Exercise regularly as advised by your health care provider.  Perform relaxation exercises.  Change your sitting, standing, and sleeping habits as advised by your health care provider.  Change positions frequently.  Lose weight or maintain a healthy weight as advised by your health care provider.  Do not use any tobacco products, including cigarettes, chewing tobacco, or electronic cigarettes. If you need help quitting, ask your health care provider.  Wear supportive footwear.  Take medicines only as directed by your health care provider. SEEK MEDICAL CARE IF:   Your pain does not go away within 1-4 weeks.  You have significant appetite or weight loss. SEEK IMMEDIATE MEDICAL CARE IF:   Your pain is severe.  You notice weakness in your arms, hands, or legs.  You begin to lose control of your bladder or bowel movements.  You have fevers or night sweats. MAKE SURE YOU:  Understand these instructions.  Will watch your condition.  Will get help right away if you are not doing well or get worse.   This information is not intended to replace advice given to you by your health care provider. Make sure you discuss any questions you have with your health care provider.   Document Released: 06/14/2007 Document Revised: 09/07/2014 Document Reviewed: 12/19/2013 Elsevier Interactive Patient  Education [employer].

## 2015-11-13 NOTE — Progress Notes (Signed)
11/13/2015 2:27 PM   DOB: March 26, 1951 / MRN: [id number]  SUBJECTIVE:  Aaron [name] is a 65 y.o. male presenting for an annual physical.  He has a history of diabetes and is taking 500 mg metformin xr daily. He does not complain of sock and glove paresthesia or nocturia.  No excessive thirst.   Last A1c shows well controlled diabetes. He is also taking low dose losartan for kidney protection.  He is due for a repeat colonoscopy in 2019.  PER [facility] he has never been screened for HEP C or HIV. He had a TDAP on 12/22/15.  Pneumonia shots are current and he is receiving the flu shot annually.   He is an ex smoker and quit 25 years ago.  When he was smoking he would smoke about a pack a week and did this for ten years.  He complains of 4 months of bilateral shoulder pain that radiates just proximal to his elbows.  Reports the pain is worse at night after work and no certain activities make it worse.  He sits chronically at work.  He has tried a homeopathic remedy and epsom salt baths with some relief.  He denies weakness.  Denies a history of neck problems.       Immunization History  Administered Date(s) Administered  . Influenza,inj,Quad PF,36+ Mos 06/28/2014, 04/29/2015  . Pneumococcal Conjugate-13 04/29/2015  . Pneumococcal Polysaccharide-23 04/06/2014  . Tdap 12/21/2005    Current outpatient prescriptions:  .  glucose blood (ONE TOUCH ULTRA TEST) test strip, Test blood sugar daily. Dx code: E11.9, Disp: 100 each, Rfl: 3 .  glucose monitoring kit (FREESTYLE) monitoring kit, 1 each by Does not apply route as needed for other., Disp: 1 each, Rfl: 0 .  losartan (COZAAR) 25 MG tablet, Take 1 tablet (25 mg total) by mouth daily., Disp: 30 tablet, Rfl: 5 .  metFORMIN (GLUCOPHAGE XR) 500 MG 24 hr tablet, Take 1 tablet (500 mg total) by mouth at bedtime., Disp: 90 tablet, Rfl: 1 .  metFORMIN (GLUCOPHAGE-XR) 500 MG 24 hr tablet, TAKE 2 TABLETS( 1000 MG) BY MOUTH AT BEDTIME, Disp: 180 tablet, Rfl: 0 .   ONETOUCH DELICA LANCETS FINE MISC, TEST BLOOD SUGAR DAILY, Disp: 100 each, Rfl: 2 .  zoster vaccine live, PF, (ZOSTAVAX) 15400 UNT/0.65ML injection, Inject 19,400 Units into the skin once., Disp: 1 each, Rfl: 0   He is allergic to erythromycin.   He  has a past medical history of Diabetes mellitus without complication ([place]).    He  reports that he has quit smoking. He has never used smokeless tobacco. He reports that he drinks alcohol. He reports that he does not use illicit drugs. He  reports that he currently engages in sexual activity. The patient  has no past surgical history on file.  His family history includes Cancer in his father and mother.  Review of Systems  Constitutional: Negative for chills and malaise/fatigue.  Cardiovascular: Negative for chest pain.  Musculoskeletal: Positive for myalgias.  Skin: Negative for rash.  Neurological: Negative for weakness and headaches.    Problem list and medications reviewed and updated by myself where necessary, and exist elsewhere in the encounter.   OBJECTIVE:  BP 133/81 mmHg  Pulse 76  Temp(Src) 97.7 F (36.5 C) (Oral)  Resp 16  Wt 197 lb 12.8 oz (89.721 kg)  Physical Exam  Constitutional: He is oriented to person, place, and time. He appears well-developed. He does not appear ill.  Eyes: Conjunctivae and EOM  are normal. Pupils are equal, round, and reactive to light.  Cardiovascular: Normal rate.   Pulmonary/Chest: Effort normal.  Abdominal: He exhibits no distension.  Musculoskeletal: Normal range of motion.  Neurological: He is alert and oriented to person, place, and time. He has normal strength and normal reflexes. He displays no atrophy and no tremor. No cranial nerve deficit or sensory deficit. He exhibits normal muscle tone. He displays no seizure activity. Coordination and gait normal. GCS eye subscore is 4. GCS verbal subscore is 5. GCS motor subscore is 6.  Skin: Skin is warm and dry. He is not diaphoretic.    Psychiatric: He has a normal mood and affect.  Nursing note and vitals reviewed.    Results for orders placed or performed in visit on 11/13/15 (from the past 72 hour(s))  POCT glycosylated hemoglobin (Hb A1C)     Status: None   Collection Time: 11/13/15  2:01 PM  Result Value Ref Range   Hemoglobin A1C 6.5     Dg Cervical Spine Complete  11/13/2015  CLINICAL DATA:  Neck pain radiating into both shoulders and arms. Initial encounter. EXAM: CERVICAL SPINE - COMPLETE 4+ VIEW COMPARISON:  None. FINDINGS: Vertebral body height and alignment are maintained. Loss of disc space height and endplate spurring are seen at C5-6. The C7-T1 level is not visualized has no swimmer's view is provided. IMPRESSION: C5-6 degenerative disc disease. Electronically Signed   By: Inge Rise M.D.   On: 11/13/2015 14:04    ASSESSMENT AND PLAN  Aaron [name] was seen today for hypertension.  Diagnoses and all orders for this visit:  Encounter for annual physical exam -     CBC with Differential/Platelet -     COMPLETE METABOLIC PANEL WITH GFR -     Lipid panel -     Hepatitis C antibody -     HIV antibody -     Sedimentation rate -     CBC with Differential/Platelet -     COMPLETE METABOLIC PANEL WITH GFR  Screening -     Cancel: Lipid panel -     Cancel: COMPLETE METABOLIC PANEL WITH GFR -     Cancel: CBC with Differential/Platelet -     Cancel: HIV antibody -     Cancel: Hepatitis C antibody -     POCT glycosylated hemoglobin (Hb A1C)  Myalgia: Rads consistent with problem 5.  Advised tylenol and physical therapy. He will contact us with regard to the referral.  If he would like to do PT will send him to Bertram Savin at [facility].   -     DG Cervical Spine Complete; Future -     Cancel: Sedimentation Rate  Immunizations reviewed and up to date -     zoster vaccine live, PF, (ZOSTAVAX) 17494 UNT/0.65ML injection; Inject 19,400 Units into the skin once.  Cervical spondylarthritis:  See myalgia.         The patient was advised to call or return to clinic if he does not see an improvement in symptoms or to seek the care of the closest emergency department if he worsens with the above plan.   Philis Fendt, MHS, PA-C [facility] 11/13/2015 2:27 PM

## 2015-11-14 LAB — LIPID PANEL
Chol/HDL Ratio: 4.3 ratio units (ref 0.0–5.0)
Cholesterol, Total: 168 mg/dL (ref 100–199)
HDL: 39 mg/dL — ABNORMAL LOW (ref >39)
LDL Calculated: 89 mg/dL (ref 0–99)
Triglycerides: 201 mg/dL — ABNORMAL HIGH (ref 0–149)
VLDL Cholesterol Cal: 40 mg/dL (ref 5–40)

## 2015-11-14 LAB — CBC WITH DIFFERENTIAL/PLATELET
Basophils Absolute: 0 10*3/uL (ref 0.0–0.2)
Basos: 0 %
EOS (ABSOLUTE): 0.1 10*3/uL (ref 0.0–0.4)
Eos: 1 %
Hematocrit: 48.2 % (ref 37.5–51.0)
Hemoglobin: 16.2 g/dL (ref 12.6–17.7)
Immature Grans (Abs): 0 10*3/uL (ref 0.0–0.1)
Immature Granulocytes: 0 %
Lymphocytes Absolute: 2.4 10*3/uL (ref 0.7–3.1)
Lymphs: 31 %
MCH: 27.7 pg (ref 26.6–33.0)
MCHC: 33.6 g/dL (ref 31.5–35.7)
MCV: 82 fL (ref 79–97)
Monocytes Absolute: 0.4 10*3/uL (ref 0.1–0.9)
Monocytes: 5 %
Neutrophils Absolute: 4.7 10*3/uL (ref 1.4–7.0)
Neutrophils: 63 %
Platelets: 297 10*3/uL (ref 150–379)
RBC: 5.85 x10E6/uL — ABNORMAL HIGH (ref 4.14–5.80)
RDW: 14.3 % (ref 12.3–15.4)
WBC: 7.6 10*3/uL (ref 3.4–10.8)

## 2015-11-14 LAB — COMPREHENSIVE METABOLIC PANEL
ALT: 20 IU/L (ref 0–44)
AST: 19 IU/L (ref 0–40)
Albumin/Globulin Ratio: 1.6 (ref 1.2–2.2)
Albumin: 4.5 g/dL (ref 3.6–4.8)
Alkaline Phosphatase: 85 IU/L (ref 39–117)
BUN/Creatinine Ratio: 19 (ref 10–22)
BUN: 13 mg/dL (ref 8–27)
Bilirubin Total: 0.4 mg/dL (ref 0.0–1.2)
CO2: 22 mmol/L (ref 18–29)
Calcium: 9.3 mg/dL (ref 8.6–10.2)
Chloride: 99 mmol/L (ref 96–106)
Creatinine, Ser: 0.7 mg/dL — ABNORMAL LOW (ref 0.76–1.27)
GFR calc Af Amer: 117 mL/min/{1.73_m2} (ref >59)
GFR calc non Af Amer: 101 mL/min/{1.73_m2} (ref >59)
Globulin, Total: 2.8 g/dL (ref 1.5–4.5)
Glucose: 100 mg/dL — ABNORMAL HIGH (ref 65–99)
Potassium: 4.1 mmol/L (ref 3.5–5.2)
Sodium: 137 mmol/L (ref 134–144)
Total Protein: 7.3 g/dL (ref 6.0–8.5)

## 2015-11-14 LAB — HIV ANTIBODY (ROUTINE TESTING W REFLEX): HIV Screen 4th Generation wRfx: NONREACTIVE

## 2015-11-14 LAB — HEPATITIS C ANTIBODY: Hep C Virus Ab: 0.2 s/co ratio (ref 0.0–0.9)

## 2015-11-14 LAB — SEDIMENTATION RATE: Sed Rate: 11 mm/hr (ref 0–30)

## 2015-11-15 ENCOUNTER — Other Ambulatory Visit: Payer: Self-pay | Admitting: Physician Assistant

## 2015-11-15 NOTE — Telephone Encounter (Signed)
Please review.  This came up on the phone list dated 3/6.   IC pt today and left message for pt to call and let us know his side effects.... Only to realize he was seen by you this week for visit.  Feel like I am missing something!!!!

## 2015-11-18 MED ORDER — METFORMIN HCL ER 500 MG PO TB24: 1000.0000 mg | ORAL_TABLET | Freq: Every day | ORAL | Status: DC

## 2015-11-18 NOTE — Addendum Note (Signed)
Addended by: Jannette Spanner on: 11/18/2015 12:16 PM   Modules accepted: Orders

## 2015-11-19 ENCOUNTER — Encounter: Payer: Self-pay | Admitting: Physician Assistant

## 2015-11-25 ENCOUNTER — Other Ambulatory Visit: Payer: Self-pay | Admitting: Physician Assistant

## 2016-01-08 ENCOUNTER — Ambulatory Visit (INDEPENDENT_AMBULATORY_CARE_PROVIDER_SITE_OTHER): Payer: Managed Care, Other (non HMO) | Admitting: Family Medicine

## 2016-01-08 VITALS — BP 134/80 | HR 76 | Temp 98.4°F | Resp 18 | Ht 65.0 in | Wt 200.2 lb

## 2016-01-08 DIAGNOSIS — B370 Candidal stomatitis: Secondary | ICD-10-CM

## 2016-01-08 DIAGNOSIS — E119 Type 2 diabetes mellitus without complications: Secondary | ICD-10-CM | POA: Diagnosis not present

## 2016-01-08 LAB — POCT CBC
Granulocyte percent: 61.9 %G (ref 37–80)
HCT, POC: 46.3 % (ref 43.5–53.7)
Hemoglobin: 15.9 g/dL (ref 14.1–18.1)
Lymph, poc: 2.3 (ref 0.6–3.4)
MCH, POC: 28 pg (ref 27–31.2)
MCHC: 34.4 g/dL (ref 31.8–35.4)
MCV: 81.4 fL (ref 80–97)
MID (cbc): 0.6 (ref 0–0.9)
MPV: 7.5 fL (ref 0–99.8)
POC Granulocyte: 4.8 (ref 2–6.9)
POC LYMPH PERCENT: 30 %L (ref 10–50)
POC MID %: 8.1 %M (ref 0–12)
Platelet Count, POC: 265 10*3/uL (ref 142–424)
RBC: 5.69 M/uL (ref 4.69–6.13)
RDW, POC: 14.2 %
WBC: 7.8 10*3/uL (ref 4.6–10.2)

## 2016-01-08 LAB — GLUCOSE, POCT (MANUAL RESULT ENTRY): POC Glucose: 94 mg/dl (ref 70–99)

## 2016-01-08 MED ORDER — NYSTATIN 100000 UNIT/ML MT SUSP: 5.0000 mL | Freq: Four times a day (QID) | OROMUCOSAL | Status: DC

## 2016-01-08 NOTE — Patient Instructions (Addendum)
IF you received an x-ray today, you will receive an invoice from [facility]. Please contact [facility] at [phone] with questions or concerns regarding your invoice.   IF you received labwork today, you will receive an invoice from [employer]. Please contact [facility] at [phone] with questions or concerns regarding your invoice.   Our billing staff will not be able to assist you with questions regarding bills from these companies.  You will be contacted with the lab results as soon as they are available. The fastest way to get your results is to activate your My Chart account. Instructions are located on the last page of this paperwork. If you have not heard from [place] regarding the results in 2 weeks, please contact this office.     Thrush, Adult Ritta Slot, also called oral candidiasis, is a fungal infection that develops in the mouth and throat and on the tongue. It causes white patches to form on the mouth and tongue. Ritta Slot is most common in older adults, but it can occur at any age.  Many cases of thrush are mild, but this infection can also be more serious. Ritta Slot can be a recurring problem for people who have chronic illnesses or who take medicines that limit the body's ability to fight infection. Because these people have difficulty fighting infections, the fungus that causes thrush can spread throughout the body. This can cause life-threatening blood or organ infections. CAUSES  Ritta Slot is usually caused by a yeast called Candida albicans. This fungus is normally present in small amounts in the mouth and on other mucous membranes. It usually causes no harm. However, when conditions are present that allow the fungus to grow uncontrolled, it invades surrounding tissues and becomes an infection. Less often, other Candida species can also lead to thrush.  RISK FACTORS Ritta Slot is more likely to develop in the following people:  People with  an impaired ability to fight infection (weakened immune system).   Older adults.   People with HIV.   People with diabetes.   People with dry mouth (xerostomia).   Pregnant women.   People with poor dental care, especially those who have false teeth.   People who use antibiotic medicines.  SIGNS AND SYMPTOMS  Ritta Slot can be a mild infection that causes no symptoms. If symptoms develop, they may include:   A burning feeling in the mouth and throat. This can occur at the start of a thrush infection.   White patches that adhere to the mouth and tongue. The tissue around the patches may be red, raw, and painful. If rubbed (during tooth brushing, for example), the patches and the tissue of the mouth may bleed easily.   A bad taste in the mouth or difficulty tasting foods.   Cottony feeling in the mouth.   Pain during eating and swallowing. DIAGNOSIS  Your health care provider can usually diagnose thrush by looking in your mouth and asking you questions about your health.  TREATMENT  Medicines that help prevent the growth of fungi (antifungals) are the standard treatment for thrush. These medicines are either applied directly to the affected area (topical) or swallowed (oral). The treatment will depend on the severity of the condition.  Mild Thrush Mild cases of thrush may clear up with the use of an antifungal mouth rinse or lozenges. Treatment usually lasts about 14 days.  Moderate to Severe Thrush  More severe thrush infections that have spread to the esophagus are treated with an  oral antifungal medicine. A topical antifungal medicine may also be used.   For some severe infections, a treatment period longer than 14 days may be needed.   Oral antifungal medicines are almost never used during pregnancy because the fetus may be harmed. However, if a pregnant woman has a rare, severe thrush infection that has spread to her blood, oral antifungal medicines may be used. In  this case, the risk of harm to the mother and fetus from the severe thrush infection may be greater than the risk posed by the use of antifungal medicines.  Persistent or Recurrent Thrush For cases of thrush that do not go away or keep coming back, treatment may involve the following:   Treatment may be needed twice as long as the symptoms last.   Treatment will include both oral and topical antifungal medicines.   People with weakened immune systems can take an antifungal medicine on a continuous basis to prevent thrush infections.  It is important to treat conditions that make you more likely to get thrush, such as diabetes or HIV.  HOME CARE INSTRUCTIONS   Only take over-the-counter or prescription medicine as directed by your health care provider. Talk to your health care provider about an over-the-counter medicine called gentian violet, which kills bacteria and fungi.   Eat plain, unflavored yogurt as directed by your health care provider. Check the label to make sure the yogurt contains live cultures. This yogurt can help healthy bacteria grow in the mouth that can stop the growth of the fungus that causes thrush.   Try these measures to help reduce the discomfort of thrush:   Drink cold liquids such as water or iced tea.   Try flavored ice treats or frozen juices.   Eat foods that are easy to swallow, such as gelatin, ice cream, or custard.   If the patches in your mouth are painful, try drinking from a straw.   Rinse your mouth several times a day with a warm saltwater rinse. You can make the saltwater mixture with 1 tsp (6 g) of salt in 8 fl oz (0.2 L) of warm water.   If you wear dentures, remove the dentures before going to bed, brush them vigorously, and soak them in a cleaning solution as directed by your health care provider.   Women who are breastfeeding should clean their nipples with an antifungal medicine as directed by their health care provider. Dry the  nipples after breastfeeding. Applying lanolin-containing body lotion may help relieve nipple soreness.  SEEK MEDICAL CARE IF:  Your symptoms are getting worse or are not improving within 7 days of starting treatment.   You have symptoms of spreading infection, such as white patches on the skin outside of the mouth.   You are nursing and you have redness, burning, or pain in the nipples that is not relieved with treatment.  MAKE SURE YOU:  Understand these instructions.  Will watch your condition.  Will get help right away if you are not doing well or get worse.   This information is not intended to replace advice given to you by your health care provider. Make sure you discuss any questions you have with your health care provider.   Document Released: 05/12/2004 Document Revised: 09/07/2014 Document Reviewed: 03/20/2013 Elsevier Interactive Patient Education [employer].

## 2016-01-08 NOTE — Progress Notes (Signed)
Subjective:    Patient ID: Aaron [name], male    DOB: 04-13-1951, 66 y.o.   MRN: [id number]  01/08/2016  Sore Throat and Burning sensation in mouth   HPI This 64 y.o. male presents for evaluation of burning sensation in mouth for ten days. Taking it easy; avoiding spicy foods; gargling with salt water and peroxide. Tongue feels very senstiive.  Lymph nodes are swollen.  No change in toothpaste.  Mild scratchy throat.    Review of Systems  Constitutional: Negative for fever, chills, diaphoresis, activity change, appetite change and fatigue.  HENT: Positive for postnasal drip and sore throat. Negative for congestion, dental problem, drooling, ear discharge, ear pain, mouth sores, nosebleeds, rhinorrhea, sneezing, tinnitus, trouble swallowing and voice change.   Respiratory: Negative for cough and shortness of breath.   Cardiovascular: Negative for chest pain, palpitations and leg swelling.  Gastrointestinal: Negative for nausea, vomiting, abdominal pain and diarrhea.  Endocrine: Negative for cold intolerance, heat intolerance, polydipsia, polyphagia and polyuria.  Skin: Negative for color change, rash and wound.  Neurological: Negative for dizziness, tremors, seizures, syncope, facial asymmetry, speech difficulty, weakness, light-headedness, numbness and headaches.  Hematological: Positive for adenopathy.  Psychiatric/Behavioral: Negative for sleep disturbance and dysphoric mood. The patient is not nervous/anxious.     Past Medical History  Diagnosis Date  . Diabetes mellitus without complication ([place])    History reviewed. No pertinent past surgical history. Allergies  Allergen Reactions  . Erythromycin Itching    Social History   Social History  . Marital Status: Married    Spouse Name: N/A  . Number of Children: N/A  . Years of Education: N/A   Occupational History  . Sales    Social History Main Topics  . Smoking status: Former Research scientist (life sciences)  . Smokeless tobacco: Never  Used  . Alcohol Use: 0.0 oz/week    0 Standard drinks or equivalent per week     Comment: 2-3 beers a week  . Drug Use: No  . Sexual Activity: Yes   Other Topics Concern  . Not on file   Social History Narrative   Married. Education: [employer].   Family History  Problem Relation Age of Onset  . Cancer Mother   . Cancer Father        Objective:    BP 134/80 mmHg  Pulse 76  Temp(Src) 98.4 F (36.9 C) (Oral)  Resp 18  Ht 5\' 5"  (1.651 m)  Wt 200 lb 3.2 oz (90.81 kg)  BMI 33.31 kg/m2  SpO2 95% Physical Exam  Constitutional: He is oriented to person, place, and time. He appears well-developed and well-nourished. No distress.  HENT:  Head: Normocephalic and atraumatic.  Right Ear: Tympanic membrane, external ear and ear canal normal.  Left Ear: Tympanic membrane, external ear and ear canal normal.  Nose: Nose normal. No mucosal edema or rhinorrhea.  Mouth/Throat: Uvula is midline, oropharynx is clear and moist and mucous membranes are normal.  Mild white film on tongue with erythema diffusely of tongue.  Eyes: Conjunctivae and EOM are normal. Pupils are equal, round, and reactive to light.  Neck: Normal range of motion. Neck supple. Carotid bruit is not present. No thyromegaly present.  Cardiovascular: Normal rate, regular rhythm, normal heart sounds and intact distal pulses.  Exam reveals no gallop and no friction rub.   No murmur heard. Pulmonary/Chest: Effort normal and breath sounds normal. He has no wheezes. He has no rales.  Abdominal: Soft. Bowel sounds are normal.  Lymphadenopathy:  He has cervical adenopathy.  Neurological: He is alert and oriented to person, place, and time. No cranial nerve deficit.  Skin: Skin is warm and dry. No rash noted. He is not diaphoretic.  Psychiatric: He has a normal mood and affect. His behavior is normal.  Nursing note and vitals reviewed.  Results for orders placed or performed in visit on 01/08/16  POCT CBC  Result Value  Ref Range   WBC 7.8 4.6 - 10.2 K/uL   Lymph, poc 2.3 0.6 - 3.4   POC LYMPH PERCENT 30.0 10 - 50 %L   MID (cbc) 0.6 0 - 0.9   POC MID % 8.1 0 - 12 %M   POC Granulocyte 4.8 2 - 6.9   Granulocyte percent 61.9 37 - 80 %G   RBC 5.69 4.69 - 6.13 M/uL   Hemoglobin 15.9 14.1 - 18.1 g/dL   HCT, POC 46.3 43.5 - 53.7 %   MCV 81.4 80 - 97 fL   MCH, POC 28.0 27 - 31.2 pg   MCHC 34.4 31.8 - 35.4 g/dL   RDW, POC 14.2 %   Platelet Count, POC 265 142 - 424 K/uL   MPV 7.5 0 - 99.8 fL  POCT glucose (manual entry)  Result Value Ref Range   POC Glucose 94 70 - 99 mg/dl       Assessment & Plan:   1. Thrush   2. Type 2 diabetes mellitus without complication, without long-term current use of insulin ([place])    -New. -stable CBC and glucose -recent CPE with HIV negative on 11/13/15.   -colonoscopy UTD. -rx for Nystatin provided.   Orders Placed This Encounter  Procedures  . POCT CBC  . POCT glucose (manual entry)   Meds ordered this encounter  Medications  . nystatin (MYCOSTATIN) 100000 UNIT/ML suspension    Sig: Take 5 mLs (500,000 Units total) by mouth 4 (four) times daily.    Dispense:  200 mL    Refill:  0    No Follow-up on file.     Kristi Elayne Guerin, M.D. [facility] [address] [place], [place]  [address] [phone] phone [phone] fax

## 2016-01-23 ENCOUNTER — Telehealth: Payer: Self-pay

## 2016-01-23 ENCOUNTER — Encounter: Payer: Self-pay | Admitting: Family Medicine

## 2016-01-23 MED ORDER — NYSTATIN 100000 UNIT/ML MT SUSP: 5.0000 mL | Freq: Four times a day (QID) | OROMUCOSAL | Status: DC

## 2016-01-23 NOTE — Telephone Encounter (Signed)
Meds ordered this encounter  Medications  . nystatin (MYCOSTATIN) 100000 UNIT/ML suspension    Sig: Take 5 mLs (500,000 Units total) by mouth 4 (four) times daily.    Dispense:  200 mL    Refill:  0    Order Specific Question:  Supervising Provider    Answer:  DOOLITTLE, ROBERT P [id number]    If symptoms persist upon completion, will need to return for re-evaluation

## 2016-01-23 NOTE — Telephone Encounter (Signed)
Patient still has burning sensation in his mouth and wanted to let Dr Tamala Julian know, stated he has finished the medicine and is better but is not 100% yet wasn't sure if he needed to come back in or if he could just get some more medicine for this.  Can someone give him a call and advise him thank you. His call back number is [phone]

## 2016-01-23 NOTE — Telephone Encounter (Signed)
Please advise 

## 2016-01-24 ENCOUNTER — Other Ambulatory Visit: Payer: Self-pay | Admitting: Family Medicine

## 2016-01-24 ENCOUNTER — Telehealth: Payer: Self-pay | Admitting: Family Medicine

## 2016-01-24 NOTE — Telephone Encounter (Signed)
Patient stated he finished his prescription. He said he is better than he was before. He is still feeling a little sensation. The thrush haven't cleared up yet. Patient request to leave this message for Dr. Tamala Julian.

## 2016-01-24 NOTE — Telephone Encounter (Signed)
See other telephone message from 01/23/16. This was taken care of by Harrison Mons, PA-C.

## 2016-01-24 NOTE — Telephone Encounter (Signed)
Left VM informing pt we sent in another round and to RTC if symptoms persist.

## 2016-03-30 ENCOUNTER — Ambulatory Visit (INDEPENDENT_AMBULATORY_CARE_PROVIDER_SITE_OTHER): Payer: Managed Care, Other (non HMO) | Admitting: Family Medicine

## 2016-03-30 VITALS — BP 128/82 | HR 96 | Temp 98.4°F | Resp 17 | Ht 65.5 in | Wt 204.0 lb

## 2016-03-30 DIAGNOSIS — S30810A Abrasion of lower back and pelvis, initial encounter: Secondary | ICD-10-CM | POA: Diagnosis not present

## 2016-03-30 DIAGNOSIS — Z23 Encounter for immunization: Secondary | ICD-10-CM

## 2016-03-30 DIAGNOSIS — T148XXA Other injury of unspecified body region, initial encounter: Secondary | ICD-10-CM

## 2016-03-30 NOTE — Progress Notes (Signed)
Patient ID: Aaron [name], male    DOB: 1951-04-08  Age: 65 y.o. MRN: [id number]  Chief Complaint  Patient presents with  . Other    injuried butt    Subjective:   Patient was working on his roof and he slipped is on his buttock and through his jeans he got a skin burn on his buttock. Initially it looked just red, then peeled and became a sore. This happened about a week ago. He has been using some OTC several perforations on it. He initially used Neosporin. It continues to hurt and be a sore place there. Otherwise he is healthy.  Current allergies, medications, problem list, past/family and social histories reviewed.  Objective:  BP 128/82 (BP Location: Right Arm, Patient Position: Sitting, Cuff Size: Normal)   Pulse 96   Temp 98.4 F (36.9 C) (Oral)   Resp 17   Ht 5' 5.5" (1.664 m)   Wt 204 lb (92.5 kg)   SpO2 98%   BMI 33.43 kg/m   No major acute distress. He has a 5 cm area of rug burn on his left buttock at the pressure area. It has granulation tissue forming.  Assessment & Plan:   Assessment: 1. Abrasion of skin   2. Immunization due       Plan: Continue using Silvadene cream on it. Moore chair about 1 hour every day. Return if worse any time.  Orders Placed This Encounter  Procedures  . Tdap vaccine greater than or equal to 7yo IM    No orders of the defined types were placed in this encounter.        Patient Instructions   Wash at least once a day, and dressed with the Silvadene ointment.(Silver sulfadiazine)  If it is not continuing to improve substantially over the next week return for a recheck  After about 5 days it probably can just be left open to the air with a dry dressing and should heal on hand.  When he washes use a little gentle soap and water  Return at anytime if concerns and is getting worse    IF you received an x-ray today, you will receive an invoice from [facility]. Please contact [facility] at  [phone] with questions or concerns regarding your invoice.   IF you received labwork today, you will receive an invoice from [employer]. Please contact [facility] at [phone] with questions or concerns regarding your invoice.   Our billing staff will not be able to assist you with questions regarding bills from these companies.  You will be contacted with the lab results as soon as they are available. The fastest way to get your results is to activate your My Chart account. Instructions are located on the last page of this paperwork. If you have not heard from [place] regarding the results in 2 weeks, please contact this office.         No Follow-up on file.   HOPPER,DAVID, MD 03/30/2016

## 2016-03-30 NOTE — Patient Instructions (Addendum)
Wash at least once a day, and dressed with the Silvadene ointment.(Silver sulfadiazine)  If it is not continuing to improve substantially over the next week return for a recheck  After about 5 days it probably can just be left open to the air with a dry dressing and should heal on hand.  When he washes use a little gentle soap and water  Return at anytime if concerns and is getting worse    IF you received an x-ray today, you will receive an invoice from [facility]. Please contact [facility] at [phone] with questions or concerns regarding your invoice.   IF you received labwork today, you will receive an invoice from [employer]. Please contact [facility] at [phone] with questions or concerns regarding your invoice.   Our billing staff will not be able to assist you with questions regarding bills from these companies.  You will be contacted with the lab results as soon as they are available. The fastest way to get your results is to activate your My Chart account. Instructions are located on the last page of this paperwork. If you have not heard from [place] regarding the results in 2 weeks, please contact this office.

## 2016-04-05 ENCOUNTER — Other Ambulatory Visit: Payer: Self-pay | Admitting: Physician Assistant

## 2016-07-13 ENCOUNTER — Ambulatory Visit (INDEPENDENT_AMBULATORY_CARE_PROVIDER_SITE_OTHER): Payer: Managed Care, Other (non HMO) | Admitting: Family Medicine

## 2016-07-13 VITALS — BP 138/76 | HR 92 | Temp 98.0°F | Resp 16 | Ht 65.5 in | Wt 203.4 lb

## 2016-07-13 DIAGNOSIS — K120 Recurrent oral aphthae: Secondary | ICD-10-CM

## 2016-07-13 DIAGNOSIS — Z23 Encounter for immunization: Secondary | ICD-10-CM | POA: Diagnosis not present

## 2016-07-13 DIAGNOSIS — L409 Psoriasis, unspecified: Secondary | ICD-10-CM | POA: Diagnosis not present

## 2016-07-13 MED ORDER — TRIAMCINOLONE ACETONIDE 0.1 % MT PSTE: 1.0000 "application " | PASTE | Freq: Two times a day (BID) | OROMUCOSAL | 12 refills | Status: DC

## 2016-07-13 MED ORDER — FLUOCINONIDE 0.1 % EX CREA: 1.0000 "application " | TOPICAL_CREAM | Freq: Two times a day (BID) | CUTANEOUS | 0 refills | Status: DC | PRN

## 2016-07-13 NOTE — Progress Notes (Signed)
Subjective:  By signing my name below, I, Moises Blood, attest that this documentation has been prepared under the direction and in the presence of Merri Ray, MD. Electronically Signed: Moises Blood, [place]. 07/13/2016 , 8:38 AM .  Patient was seen in Room 8 .   Patient ID: Aaron [name], male    DOB: Oct 29, 1950, 65 y.o.   MRN: [id number] Chief Complaint  Patient presents with  . Mouth Lesions    jon tongue x 1 week  . Immunizations    per patient  . Medication Refill    cream for psoriasis per patient not sure name   HPI Aaron [name] is a 65 y.o. male  Patient is here for multiple concerns including mouth sores and medication refill for psoriasis.   Mouth Sore Patient reports noticing a canker sore on his tongue about a week ago. He isn't sure how it appeared. He is able to eat and drink, but the area is a little tender. He's gargled with sea salt solution from pharmacy and a dilution hydrogen peroxide.   Psoriasis cream His psoriasis mostly affects his left elbow. He was prescribed steroid cream fluocinonide by Dr. Brigitte Pulse in Aug 2015. He was on taclonex cream in the past but it was too expensive back in Aug 2015.   Immunizations He also received flu shot today.   Patient Active Problem List   Diagnosis Date Noted  . Type II or unspecified type diabetes mellitus without mention of complication, not stated as uncontrolled 04/06/2014   Past Medical History:  Diagnosis Date  . Diabetes mellitus without complication ([place])    No past surgical history on file. Allergies  Allergen Reactions  . Erythromycin Itching   Prior to Admission medications   Medication Sig Start Date End Date Taking? Authorizing Provider  losartan (COZAAR) 25 MG tablet Take 1 tablet (25 mg total) by mouth daily. 05/06/15  Yes Tereasa Coop, PA-C  metFORMIN (GLUCOPHAGE-XR) 500 MG 24 hr tablet TAKE 2 TABLETS(1000 MG) BY MOUTH DAILY WITH BREAKFAST 04/06/16  Yes Wardell Honour, MD  glucose  blood (ONE TOUCH ULTRA TEST) test strip Test blood sugar daily. Dx code: E11.9 04/08/15   Dorian Heckle English, PA  glucose monitoring kit (FREESTYLE) monitoring kit 1 each by Does not apply route as needed for other. 03/05/14   Roselee Culver, MD  nystatin (MYCOSTATIN) 100000 UNIT/ML suspension Take 5 mLs (500,000 Units total) by mouth 4 (four) times daily. Patient not taking: Reported on 07/13/2016 01/23/16   Chelle Jeffery, PA-C  ONETOUCH DELICA LANCETS FINE MISC TEST BLOOD SUGAR DAILY 11/19/14   Mancel Bale, PA-C  zoster vaccine live, PF, (ZOSTAVAX) 84132 UNT/0.65ML injection Inject 19,400 Units into the skin once. 11/13/15   Tereasa Coop, PA-C   Social History   Social History  . Marital status: Married    Spouse name: N/A  . Number of children: N/A  . Years of education: N/A   Occupational History  . Sales    Social History Main Topics  . Smoking status: Former Research scientist (life sciences)  . Smokeless tobacco: Never Used  . Alcohol use 0.0 oz/week     Comment: 2-3 beers a week  . Drug use: No  . Sexual activity: Yes   Other Topics Concern  . Not on file   Social History Narrative   Married. Education: [employer].   Review of Systems  Constitutional: Negative for chills, fatigue, fever and unexpected weight change.  HENT: Positive for mouth sores.  Negative for sore throat and trouble swallowing.   Eyes: Negative for visual disturbance.  Respiratory: Negative for cough, chest tightness and shortness of breath.   Cardiovascular: Negative for chest pain, palpitations and leg swelling.  Gastrointestinal: Negative for abdominal pain and blood in stool.  Skin: Positive for rash. Negative for wound.  Neurological: Negative for dizziness, light-headedness and headaches.       Objective:   Physical Exam  Constitutional: He is oriented to person, place, and time. He appears well-developed and well-nourished. No distress.  HENT:  Head: Normocephalic and atraumatic.  Mouth/Throat: Oropharynx  is clear and moist and mucous membranes are normal. Oral lesions present. No oropharyngeal exudate.  Small shallow ulcer on the right lateral border of the tongue; no other oral lesions  Eyes: EOM are normal. Pupils are equal, round, and reactive to light.  Neck: Neck supple.  Cardiovascular: Normal rate.   Pulmonary/Chest: Effort normal. No respiratory distress.  Musculoskeletal: Normal range of motion.  Neurological: He is alert and oriented to person, place, and time.  Skin: Skin is warm and dry.  Left elbow: there's 2-3cm patch of white scale with pink erythematous base, slightly keratotic; right elbow unaffected  Psychiatric: He has a normal mood and affect. His behavior is normal.  Nursing note and vitals reviewed.   Vitals:   07/13/16 0802  BP: 138/76  Pulse: 92  Resp: 16  Temp: 98 F (36.7 C)  TempSrc: Oral  SpO2: 95%  Weight: 203 lb 6.4 oz (92.3 kg)  Height: 5' 5.5" (1.664 m)      Assessment & Plan:    Aaron [name] is a 65 y.o. male Psoriasis - Plan: Fluocinonide 0.1 % CREA  - Small patch left elbow. Restart fluocinonide steroid cream as tolerated this in the past. If [place] who live, can look into other options, or consider dermatology eval. Handout given.  Need for prophylactic vaccination and inoculation against influenza - Plan: Flu Vaccine QUAD 36+ mos IM  -Flu vaccine given  Aphthous ulcer of tongue - Plan: triamcinolone (KENALOG) 0.1 % paste  -Appears to be shallow, suspect it is improving, but did write Kenalog in [facility] prescription if needed for current episode or future episodes. If not resolving within 2 weeks, return here or dental care provider.  Meds ordered this encounter  Medications  . triamcinolone (KENALOG) 0.1 % paste    Sig: Use as directed 1 application in the mouth or throat 2 (two) times daily.    Dispense:  5 g    Refill:  12  . Fluocinonide 0.1 % CREA    Sig: Apply 1 application topically 2 (two) times daily as needed.     Dispense:  120 g    Refill:  0   Patient Instructions    The ulcer on the tongue appears to be improving, but I did write some steroid cream if needed. If diarrhea does not resolve within 2 weeks, recommend recheck here or with her dental care provider.  I wrote for some more steroid cream for the psoriasis on your elbow. If the coverage has changed with your insurance, please call us and we can look into other less costly options if needed, or can follow-up with dermatologist if needed.  Return to the clinic or go to the nearest emergency room if any of your symptoms worsen or new symptoms occur.    Psoriasis Psoriasis is a long-term (chronic) condition of skin inflammation. It occurs because your immune system causes skin cells to  form too quickly. As a result, too many skin cells grow and create raised, red patches (plaques) that look silvery on your skin. Plaques may appear anywhere on your body. They can be any size or shape. Psoriasis can come and go. The condition varies from mild to very severe. It cannot be passed from one person to another (not contagious).  CAUSES  The cause of psoriasis is not known, but certain factors can make the condition worse. These include:   Damage or trauma to the skin, such as cuts, scrapes, sunburn, and dryness.  Lack of sunlight.  Certain medicines.  Alcohol.  Tobacco use.  Stress.  Infections caused by bacteria or viruses. RISK FACTORS This condition is more likely to develop in:  People with a family history of psoriasis.  People who are Caucasian.  People who are between the ages of 15-16 and 75-70 years old. SYMPTOMS  There are five different types of psoriasis. You can have more than one type of psoriasis during your life. Types are:   Plaque.  Guttate.  Inverse.  Pustular.  Erythrodermic. Each type of psoriasis has different symptoms.   Plaque psoriasis symptoms include red, raised plaques with a silvery white  coating (scale). These plaques may be itchy. Your nails may be pitted and crumbly or fall off.  Guttate psoriasis symptoms include small red spots that often show up on your trunk, arms, and legs. These spots may develop after you have been sick, especially with strep throat.  Inverse psoriasis symptoms include plaques in your underarm area, under your breasts, or on your genitals, groin, or buttocks.  Pustular psoriasis symptoms include pus-filled bumps that are painful, red, and swollen on the palms of your hands or the soles of your feet. You also may feel exhausted, feverish, weak, or have no appetite.  Erythrodermic psoriasis symptoms include bright red skin that may look burned. You may have a fast heartbeat and a body temperature that is too high or too low. You may be itchy or in pain. DIAGNOSIS  Your health care provider may suspect psoriasis based on your symptoms and family history. Your health care provider will also do a physical exam. This may include a procedure to remove a tissue sample (biopsy) for testing. You may also be referred to a health care provider who specializes in skin diseases (dermatologist).  TREATMENT There is no cure for this condition, but treatment can help manage it. Goals of treatment include:   Helping your skin heal.  Reducing itching and inflammation.  Slowing the growth of new skin cells.  Helping your immune system respond better to your skin. Treatment varies, depending on the severity of your condition. Treatment may include:   Creams or ointments.  Ultraviolet ray exposure (light therapy). This may include natural sunlight or light therapy in a medical office.  Medicines (systemic therapy). These medicines can help your body better manage skin cell turnover and inflammation. They may be used along with light therapy or ointments. You may also get antibiotic medicines if you have an infection. HOME CARE INSTRUCTIONS Skin Care  Moisturize  your skin as needed. Only use moisturizers that have been approved by your health care provider.   Apply cool compresses to the affected areas.   Do not scratch your skin.  Lifestyle  Do not use tobacco products. This includes cigarettes, chewing tobacco, and e-cigarettes. If you need help quitting, ask your health care provider.  Drink little or no alcohol.   Try techniques for  stress reduction, such as meditation or yoga.  Get exposure to the sun as told by your health care provider. Do not get sunburned.   Consider joining a psoriasis support group.  Medicines  Take or use over-the-counter and prescription medicines only as told by your health care provider.  If you were prescribed an antibiotic, take or use it as told by your health care provider. Do not stop taking the antibiotic even if your condition starts to improve. General Instructions  Keep a journal to help track what triggers an outbreak. Try to avoid any triggers.   See a counselor or social worker if feelings of sadness, frustration, and hopelessness about your condition are interfering with your work and relationships.  Keep all follow-up visits as told by your health care provider. This is important. SEEK MEDICAL CARE IF:  Your pain gets worse.  You have increasing redness or warmth in the affected areas.   You have new or worsening pain or stiffness in your joints.  Your nails start to break easily or pull away from the nail bed.   You have a fever.   You feel depressed.   This information is not intended to replace advice given to you by your health care provider. Make sure you discuss any questions you have with your health care provider.   Document Released: 08/14/2000 Document Revised: 05/08/2015 Document Reviewed: 01/02/2015 Elsevier Interactive Patient Education 2016 [place]. Oral Ulcers Oral ulcers are painful, shallow sores around the lining of the mouth. They can affect  the gums, the inside of the lips, and the cheeks. (Sores on the outside of the lips and on the face are different.) They typically first occur in school-aged children and teenagers. Oral ulcers may also be called canker sores or cold sores. CAUSES  Canker sores and cold sores can be caused by many factors including:  Infection.  Injury.  Sun exposure.  Medications.  Emotional stress.  Food allergies.  Vitamin deficiencies.  Toothpastes containing sodium lauryl sulfate. The herpes virus can be the cause of mouth ulcers. The first infection can be severe and cause 10 or more ulcers on the gums, tongue, and lips with fever and difficulty in swallowing. This infection usually occurs between the ages of 79 and 3 years.  SYMPTOMS  The typical sore is about  inch (6 mm) in size and is an oval or round ulcer with red borders. DIAGNOSIS  Your caregiver can diagnose simple oral ulcers by examination. Additional testing is usually not required.  TREATMENT  Treatment is aimed at pain relief. Generally, oral ulcers resolve by themselves within 1 to 2 weeks without medication and are not contagious unless caused by herpes (and other viruses). Antibiotics are not effective with mouth sores. Avoid direct contact with others until the ulcer is completely healed. See your caregiver for follow-up care as recommended. Also:  Offer a soft diet.  Encourage plenty of fluids to prevent dehydration. Popsicles and milk shakes can be helpful.  Avoid acidic and salty foods and drinks such as orange juice.  Infants and young children will often refuse to drink because of pain. Using a teaspoon, cup, or syringe to give small amounts of fluids frequently can help prevent dehydration.  Cold compresses on the face may help reduce pain.  Pain medication can help control soreness.  A solution of diphenhydramine mixed with a liquid antacid can be useful to decrease the soreness of ulcers. Consult a caregiver for  the dosing.  Liquids or  ointments with a numbing ingredient may be helpful when used as recommended.  Older children and teenagers can rinse their mouth with a salt-water mixture (1/2 teaspoon of salt in 8 ounces of water) four times a day. This treatment is uncomfortable but may reduce the time the ulcers are present.  There are many over-the-counter throat lozenges and medications available for oral ulcers. Their effectiveness has not been studied.  Consult your medical caregiver prior to using homeopathic treatments for oral ulcers. SEEK MEDICAL CARE IF:   You think your child needs to be seen.  The pain worsens and you cannot control it.  There are 4 or more ulcers.  The lips and gums begin to bleed and crust.  A single mouth ulcer is near a tooth that is causing a toothache or pain.  Your child has a fever, swollen face, or swollen glands.  The ulcers began after starting a medication.  Mouth ulcers keep reoccurring or last more than 2 weeks.  You think your child is not taking adequate fluids. SEEK IMMEDIATE MEDICAL CARE IF:   Your child has a high fever.  Your child is unable to swallow or becomes dehydrated.  Your child looks or acts very ill.  An ulcer caused by a chemical your child accidentally put in their mouth.   This information is not intended to replace advice given to you by your health care provider. Make sure you discuss any questions you have with your health care provider.   Document Released: 09/24/2004 Document Revised: 09/07/2014 Document Reviewed: 01/02/2015 Elsevier Interactive Patient Education 2016 [employer].    IF you received an x-ray today, you will receive an invoice from [facility]. Please contact [facility] at [phone] with questions or concerns regarding your invoice.   IF you received labwork today, you will receive an invoice from [employer]. Please contact [facility] at  [phone] with questions or concerns regarding your invoice.   Our billing staff will not be able to assist you with questions regarding bills from these companies.  You will be contacted with the lab results as soon as they are available. The fastest way to get your results is to activate your My Chart account. Instructions are located on the last page of this paperwork. If you have not heard from [place] regarding the results in 2 weeks, please contact this office.       I personally performed the services described in this documentation, which was scribed in my presence. The recorded information has been reviewed and considered, and addended by me as needed.   Signed,   Merri Ray, MD [facility].  07/13/16 8:41 AM

## 2016-07-13 NOTE — Patient Instructions (Addendum)
The ulcer on the tongue appears to be improving, but I did write some steroid cream if needed. If diarrhea does not resolve within 2 weeks, recommend recheck here or with her dental care provider.  I wrote for some more steroid cream for the psoriasis on your elbow. If the coverage has changed with your insurance, please call us and we can look into other less costly options if needed, or can follow-up with dermatologist if needed.  Return to the clinic or go to the nearest emergency room if any of your symptoms worsen or new symptoms occur.    Psoriasis Psoriasis is a long-term (chronic) condition of skin inflammation. It occurs because your immune system causes skin cells to form too quickly. As a result, too many skin cells grow and create raised, red patches (plaques) that look silvery on your skin. Plaques may appear anywhere on your body. They can be any size or shape. Psoriasis can come and go. The condition varies from mild to very severe. It cannot be passed from one person to another (not contagious).  CAUSES  The cause of psoriasis is not known, but certain factors can make the condition worse. These include:   Damage or trauma to the skin, such as cuts, scrapes, sunburn, and dryness.  Lack of sunlight.  Certain medicines.  Alcohol.  Tobacco use.  Stress.  Infections caused by bacteria or viruses. RISK FACTORS This condition is more likely to develop in:  People with a family history of psoriasis.  People who are Caucasian.  People who are between the ages of 15-14 and 9-1 years old. SYMPTOMS  There are five different types of psoriasis. You can have more than one type of psoriasis during your life. Types are:   Plaque.  Guttate.  Inverse.  Pustular.  Erythrodermic. Each type of psoriasis has different symptoms.   Plaque psoriasis symptoms include red, raised plaques with a silvery white coating (scale). These plaques may be itchy. Your nails may be  pitted and crumbly or fall off.  Guttate psoriasis symptoms include small red spots that often show up on your trunk, arms, and legs. These spots may develop after you have been sick, especially with strep throat.  Inverse psoriasis symptoms include plaques in your underarm area, under your breasts, or on your genitals, groin, or buttocks.  Pustular psoriasis symptoms include pus-filled bumps that are painful, red, and swollen on the palms of your hands or the soles of your feet. You also may feel exhausted, feverish, weak, or have no appetite.  Erythrodermic psoriasis symptoms include bright red skin that may look burned. You may have a fast heartbeat and a body temperature that is too high or too low. You may be itchy or in pain. DIAGNOSIS  Your health care provider may suspect psoriasis based on your symptoms and family history. Your health care provider will also do a physical exam. This may include a procedure to remove a tissue sample (biopsy) for testing. You may also be referred to a health care provider who specializes in skin diseases (dermatologist).  TREATMENT There is no cure for this condition, but treatment can help manage it. Goals of treatment include:   Helping your skin heal.  Reducing itching and inflammation.  Slowing the growth of new skin cells.  Helping your immune system respond better to your skin. Treatment varies, depending on the severity of your condition. Treatment may include:   Creams or ointments.  Ultraviolet ray exposure (light therapy). This may include  natural sunlight or light therapy in a medical office.  Medicines (systemic therapy). These medicines can help your body better manage skin cell turnover and inflammation. They may be used along with light therapy or ointments. You may also get antibiotic medicines if you have an infection. HOME CARE INSTRUCTIONS Skin Care  Moisturize your skin as needed. Only use moisturizers that have been  approved by your health care provider.   Apply cool compresses to the affected areas.   Do not scratch your skin.  Lifestyle  Do not use tobacco products. This includes cigarettes, chewing tobacco, and e-cigarettes. If you need help quitting, ask your health care provider.  Drink little or no alcohol.   Try techniques for stress reduction, such as meditation or yoga.  Get exposure to the sun as told by your health care provider. Do not get sunburned.   Consider joining a psoriasis support group.  Medicines  Take or use over-the-counter and prescription medicines only as told by your health care provider.  If you were prescribed an antibiotic, take or use it as told by your health care provider. Do not stop taking the antibiotic even if your condition starts to improve. General Instructions  Keep a journal to help track what triggers an outbreak. Try to avoid any triggers.   See a counselor or social worker if feelings of sadness, frustration, and hopelessness about your condition are interfering with your work and relationships.  Keep all follow-up visits as told by your health care provider. This is important. SEEK MEDICAL CARE IF:  Your pain gets worse.  You have increasing redness or warmth in the affected areas.   You have new or worsening pain or stiffness in your joints.  Your nails start to break easily or pull away from the nail bed.   You have a fever.   You feel depressed.   This information is not intended to replace advice given to you by your health care provider. Make sure you discuss any questions you have with your health care provider.   Document Released: 08/14/2000 Document Revised: 05/08/2015 Document Reviewed: 01/02/2015 Elsevier Interactive Patient Education 2016 [place]. Oral Ulcers Oral ulcers are painful, shallow sores around the lining of the mouth. They can affect the gums, the inside of the lips, and the cheeks. (Sores on  the outside of the lips and on the face are different.) They typically first occur in school-aged children and teenagers. Oral ulcers may also be called canker sores or cold sores. CAUSES  Canker sores and cold sores can be caused by many factors including:  Infection.  Injury.  Sun exposure.  Medications.  Emotional stress.  Food allergies.  Vitamin deficiencies.  Toothpastes containing sodium lauryl sulfate. The herpes virus can be the cause of mouth ulcers. The first infection can be severe and cause 10 or more ulcers on the gums, tongue, and lips with fever and difficulty in swallowing. This infection usually occurs between the ages of 14 and 3 years.  SYMPTOMS  The typical sore is about  inch (6 mm) in size and is an oval or round ulcer with red borders. DIAGNOSIS  Your caregiver can diagnose simple oral ulcers by examination. Additional testing is usually not required.  TREATMENT  Treatment is aimed at pain relief. Generally, oral ulcers resolve by themselves within 1 to 2 weeks without medication and are not contagious unless caused by herpes (and other viruses). Antibiotics are not effective with mouth sores. Avoid direct contact with others  until the ulcer is completely healed. See your caregiver for follow-up care as recommended. Also:  Offer a soft diet.  Encourage plenty of fluids to prevent dehydration. Popsicles and milk shakes can be helpful.  Avoid acidic and salty foods and drinks such as orange juice.  Infants and young children will often refuse to drink because of pain. Using a teaspoon, cup, or syringe to give small amounts of fluids frequently can help prevent dehydration.  Cold compresses on the face may help reduce pain.  Pain medication can help control soreness.  A solution of diphenhydramine mixed with a liquid antacid can be useful to decrease the soreness of ulcers. Consult a caregiver for the dosing.  Liquids or ointments with a numbing  ingredient may be helpful when used as recommended.  Older children and teenagers can rinse their mouth with a salt-water mixture (1/2 teaspoon of salt in 8 ounces of water) four times a day. This treatment is uncomfortable but may reduce the time the ulcers are present.  There are many over-the-counter throat lozenges and medications available for oral ulcers. Their effectiveness has not been studied.  Consult your medical caregiver prior to using homeopathic treatments for oral ulcers. SEEK MEDICAL CARE IF:   You think your child needs to be seen.  The pain worsens and you cannot control it.  There are 4 or more ulcers.  The lips and gums begin to bleed and crust.  A single mouth ulcer is near a tooth that is causing a toothache or pain.  Your child has a fever, swollen face, or swollen glands.  The ulcers began after starting a medication.  Mouth ulcers keep reoccurring or last more than 2 weeks.  You think your child is not taking adequate fluids. SEEK IMMEDIATE MEDICAL CARE IF:   Your child has a high fever.  Your child is unable to swallow or becomes dehydrated.  Your child looks or acts very ill.  An ulcer caused by a chemical your child accidentally put in their mouth.   This information is not intended to replace advice given to you by your health care provider. Make sure you discuss any questions you have with your health care provider.   Document Released: 09/24/2004 Document Revised: 09/07/2014 Document Reviewed: 01/02/2015 Elsevier Interactive Patient Education 2016 [employer].    IF you received an x-ray today, you will receive an invoice from [facility]. Please contact [facility] at [phone] with questions or concerns regarding your invoice.   IF you received labwork today, you will receive an invoice from [employer]. Please contact [facility] at [phone] with questions or concerns regarding your  invoice.   Our billing staff will not be able to assist you with questions regarding bills from these companies.  You will be contacted with the lab results as soon as they are available. The fastest way to get your results is to activate your My Chart account. Instructions are located on the last page of this paperwork. If you have not heard from [place] regarding the results in 2 weeks, please contact this office.

## 2016-07-31 ENCOUNTER — Other Ambulatory Visit: Payer: Self-pay | Admitting: Family Medicine

## 2016-07-31 MED ORDER — METFORMIN HCL ER 500 MG PO TB24: 1000.0000 mg | ORAL_TABLET | Freq: Every day | ORAL | 0 refills | Status: DC

## 2016-07-31 NOTE — Telephone Encounter (Signed)
10/2015 last ov and labs

## 2017-01-11 ENCOUNTER — Ambulatory Visit (INDEPENDENT_AMBULATORY_CARE_PROVIDER_SITE_OTHER): Payer: 59

## 2017-01-11 ENCOUNTER — Encounter: Payer: Self-pay | Admitting: Physician Assistant

## 2017-01-11 ENCOUNTER — Ambulatory Visit (INDEPENDENT_AMBULATORY_CARE_PROVIDER_SITE_OTHER): Payer: 59 | Admitting: Physician Assistant

## 2017-01-11 VITALS — BP 143/82 | HR 101 | Temp 98.7°F | Resp 17 | Ht 65.5 in | Wt 203.0 lb

## 2017-01-11 DIAGNOSIS — S99912A Unspecified injury of left ankle, initial encounter: Secondary | ICD-10-CM

## 2017-01-11 DIAGNOSIS — S82832A Other fracture of upper and lower end of left fibula, initial encounter for closed fracture: Secondary | ICD-10-CM

## 2017-01-11 DIAGNOSIS — S82302A Unspecified fracture of lower end of left tibia, initial encounter for closed fracture: Secondary | ICD-10-CM

## 2017-01-11 MED ORDER — NAPROXEN 500 MG PO TABS: 500.0000 mg | ORAL_TABLET | Freq: Two times a day (BID) | ORAL | 0 refills | Status: DC

## 2017-01-11 NOTE — Patient Instructions (Addendum)
Do not bear weight on the affected ankle. Keep the splint in place. You can take naproxen twice a day for pain and swelling. If you have worsening pain, please let our office know. Elevate the leg tonight. Do not let the splint get wet in the shower. You should hear from ortho within the next 1-2 days if you have not, please contact our office. Thank you for letting me participate in your health and well being.    Nondisplaced Fibular Ankle Fracture Treated With Immobilization, Adult A nondisplaced fibular ankle fracture is a simple break of the bottom of the fibula (lateral malleolus). The fibula is a bone in the lower leg, between the knee and the foot. In a nondisplaced fracture, the pieces of the broken bone line up with each other and are not out of place. This condition usually does not need surgery and can be treated with a splint or cast. What are the causes? This condition may be caused by:  A hard, direct hit (blow) or injury to the side of the leg.  A powerful twisting or rotating movement.  Rolling the ankle.  Falling or tripping. What increases the risk? You are more likely to develop this condition if:  You play sports that involve a lot of running and pivoting, such as basketball.  You play impact sports, such as football or soccer.  You smoke.  You have diabetes.  You have a history of ankle fractures.  You are obese. What are the signs or symptoms? Symptoms of this condition include:  Severe pain that begins immediately after the injury.  Bruising.  Swelling.  Inability to put weight on the injured ankle.  An ankle that is tender to the touch. How is this diagnosed? This condition is diagnosed based on:  Your medical history.  A physical exam.  Imaging tests to confirm the fracture and to evaluate the extent of the injury. These tests may include:  X-rays.  Stress X-ray. During this test, your health care provider will put pressure on your ankle  while taking an X-ray. This will help to determine whether your ankle is stable.  CT scan.  MRI. How is this treated? This condition may be treated with:  A splint.  Icing and raising (elevating) the ankle.  A cast.  A removable cast or walking "boot."  Crutches. These may be needed to help you get around. Follow these instructions at home: Managing pain, stiffness, and swelling   If directed, put ice on the injured area.  If you have a removable splint or cast, remove it as told by your health care provider.  Put ice in a plastic bag.  Place a towel between your skin and the bag, or between your cast and the bag.  Leave the ice on for 20 minutes, 2-3 times a day.  Raise (elevate) the injured area above the level of your heart while you are sitting or lying down.  Move your toes often to avoid stiffness and to lessen swelling.  Use crutches as directed. Resume walking without crutches as directed by your health care provider or when you are comfortable doing that. If you have a removable splint or cast:   Wear the removable splint or cast as told by your health care provider. Remove it only as told by your health care provider.  Loosen the splint or cast if your toes tingle, become numb, or turn cold and blue.  Keep the splint or cast clean.  If the  splint or cast is not waterproof:  Do not let it get wet.  Cover it with a watertight covering when you take a bath or a shower. If you have a cast that cannot be removed:   Do not stick anything inside the cast to scratch your skin. Doing that increases your risk of infection.  Check the skin around the cast every day. Contact your health care provider if you notice any redness, irritation, or swelling.  You may put lotion on dry skin around the edges of the cast. Do not put lotion on the skin underneath the cast.  Keep the cast clean.  Do not break off edges or trim your cast.  If the cast is not  waterproof:  Do not let it get wet.  Cover it with a watertight covering when you take a bath or a shower. Activity   Do exercises and stretches as told by your health care provider.  Ask your health care provider when it is safe to drive if you have a cast or splint on your ankle.  Do not drive or use heavy machinery while taking prescription pain medicine. General instructions   Take over-the-counter and prescription medicines only as told by your health care provider.  Do not take baths, swim, or use a hot tub until your health care provider approves. Ask your health care provider if you can take showers. You may only be allowed to take sponge baths for bathing.  Do not use the injured leg to support your body weight until your health care provider says that you can. Use crutches as told by your health care provider.  Do not use any products that contain nicotine or tobacco, such as cigarettes and e-cigarettes. These can delay bone healing. If you need help quitting, ask your health care provider.  Keep all follow-up visits as told by your health care provider. This is important. Contact a health care provider if:  Your cast gets damaged or it breaks.  Your pain does not get better with medicine. Get help right away if:  You develop severe pain or more swelling in your ankle or foot that cannot be controlled with medicines.  Your skin or nails below the injury turn blue or gray, feel cold, or become numb.  The skin under your cast burns or stings.  There is a bad smell or pus coming from under the cast.  You cannot move your toes. Summary  A nondisplaced fibular ankle fracture is a simple break of the bottom of a bone in the lower leg (fibula).  This condition may be treated with a splint, icing and elevation, a cast, or a removable cast or walking "boot." You may also need crutches to get around while your ankle heals.  To help manage pain, stiffness, swelling, put  ice on the injured area as directed by your health care provider.  You should not use the injured leg to support your body weight until your health care provider says that you can. Use crutches as told by your health care provider. This information is not intended to replace advice given to you by your health care provider. Make sure you discuss any questions you have with your health care provider. Document Released: 05/09/2002 Document Revised: 07/27/2016 Document Reviewed: 07/27/2016 Elsevier Interactive Patient Education  2017 [place].   Tibial Fracture, Adult A tibial fracture is a break in your tibia bone. The tibia is the large shin bone in your lower leg. The  bone will be held in place with a cast or splint until it is healed. Follow these instructions at home: If you have a cast:   Do not scratch under the cast.  Check the skin around the cast every day. You may put lotion on any red or sore areas.  Keep your cast dry and clean. If you have a splint:   Wear the splint as told by your doctor.  Loosen the elastic around the splint if your toes get numb, tingle, or turn cold or blue.  Do not put pressure on the cast or splint until it is hard.  Do not put the cast or splint in water. Cover it with a plastic bag when bathing.  Use crutches as told by your doctor.  Take medicines only as told by your doctor.  Keep all follow-up visits as told by your doctor. This is important. Contact a doctor if:  Your pain gets worse or is not controlled with medicine.  You have increased puffiness (swelling) or redness in your foot.  You start to lose feeling in your foot or toes. Get help right away if:  Your foot or toes get cold or turn blue.  You have bad pain in your leg, especially if it gets worse when you move your toes. This information is not intended to replace advice given to you by your health care provider. Make sure you discuss any questions you have with your  health care provider. Document Released: 09/19/2010 Document Revised: 04/19/2016 Document Reviewed: 10/11/2013 Elsevier Interactive Patient Education  2017 [employer].    IF you received an x-ray today, you will receive an invoice from [facility]. Please contact [facility] at [phone] with questions or concerns regarding your invoice.   IF you received labwork today, you will receive an invoice from [place]. Please contact [facility] at [phone] with questions or concerns regarding your invoice.   Our billing staff will not be able to assist you with questions regarding bills from these companies.  You will be contacted with the lab results as soon as they are available. The fastest way to get your results is to activate your My Chart account. Instructions are located on the last page of this paperwork. If you have not heard from [place] regarding the results in 2 weeks, please contact this office.

## 2017-01-11 NOTE — Progress Notes (Deleted)
Subjective:    Aaron [name] is a 66 y.o. male who presents with left ankle pain. Onset of the symptoms was 2 days ago. Inciting event: inverted while cleaning pool. Current symptoms include: ability to bear weight, but with some pain, bruising, redness, swelling and worsening symptoms after a period of activity. Aggravating factors: going up and down stairs, standing and walking . Symptoms have gradually worsened. Patient has had no prior ankle problems. Evaluation to date: none. Treatment to date: brace which is somewhat effective, ice and tylenol. {Common ambulatory SmartLinks:19316}    Objective:    BP (!) 143/82 (BP Location: Right Arm, Patient Position: Sitting, Cuff Size: Large)   Pulse (!) 101   Temp 98.7 F (37.1 C) (Oral)   Resp 17   Ht 5' 5.5" (1.664 m)   Wt 203 lb (92.1 kg)   SpO2 95%   BMI 33.27 kg/m  Right ankle:   {exam; ankle:14100}  Left ankle:   {exam; ankle:14100}   Swollen left ankle and left foot, pain with mallelous, fibula and tibia, and fifth metatarsal, erythema over atflt, brusing over ATFL   Dg Tibia/fibula Left  Result Date: 01/11/2017 CLINICAL DATA:  Injury, not specified. EXAM: LEFT TIBIA AND FIBULA - 2 VIEW COMPARISON:  None. FINDINGS: There is an oblique fracture of the distal fibula, nondisplaced. No fracture of the tibia identified on these two views. Ankle study recommended. IMPRESSION: Nondisplaced oblique fracture of the distal fibula. Electronically Signed   By: Nelson Chimes M.D.   On: 01/11/2017 18:04   Dg Ankle Complete Left  Result Date: 01/11/2017 CLINICAL DATA:  66 year old male injured ankle 2 days ago. Initial encounter. EXAM: LEFT ANKLE COMPLETE - 3+ VIEW COMPARISON:  None. FINDINGS: Oblique spiral type fracture distal left fibula. Fracture anterior inferior left tibia. Plantar spur.  Tiny spur Achilles tendon insertion site. IMPRESSION: Oblique spiral type fracture distal left fibula. Fracture anterior inferior left tibia. Electronically  Signed   By: Genia Del M.D.   On: 01/11/2017 18:03   Dg Foot Complete Left  Result Date: 01/11/2017 CLINICAL DATA:  Injury, type not specified. EXAM: LEFT FOOT - COMPLETE 3+ VIEW COMPARISON:  None. FINDINGS: No fracture of the foot is identified. There is chronic osteoarthritis at the metatarsal phalangeal joint of the great toe. Small plantar calcaneal spur. Mild midfoot degenerative changes. Question acute fracture at the anterior inferior corner of the tibia. There is a spiral fracture of the distal fibula. Ankle films would be suggested. IMPRESSION: Question acute fracture at the anterior inferior corner of the tibia. Spiral fracture of the distal fibula. Ankle films would evaluate this to better advantage. Electronically Signed   By: Nelson Chimes M.D.   On: 01/11/2017 18:00    Imaging: X-ray of {R/L:19164} ankle(s): {normal:5769::"no fracture, dislocation, swelling or degenerative changes noted"}    Assessment:    {ankle pain dx list:14101}    Plan:    {ankle tx plan:14102}

## 2017-01-11 NOTE — Progress Notes (Signed)
Aaron [name]  MRN: [id number] DOB: 06/09/1951  Subjective:   Aaron [name] is a 66 y.o. male who presents with left ankle pain. Onset of the symptoms was 2 days ago. Inciting event: inverted while cleaning pool. Current symptoms include: ability to bear weight, but with some pain, bruising, redness, swelling and worsening symptoms after a period of activity. Aggravating factors: going up and down stairs, standing and walking . Symptoms have gradually worsened. Patient has had no prior ankle problems. Evaluation to date: none. Treatment to date: brace which is somewhat effective, ice and tylenol. Notes the tylenol has been good at controlling his pain.   Review of Systems  Constitutional: Negative for chills, diaphoresis and fever.      Patient Active Problem List   Diagnosis Date Noted  . Type II or unspecified type diabetes mellitus without mention of complication, not stated as uncontrolled 04/06/2014    Current Outpatient Prescriptions on File Prior to Visit  Medication Sig Dispense Refill  . Fluocinonide 0.1 % CREA Apply 1 application topically 2 (two) times daily as needed. 120 g 0  . glucose blood (ONE TOUCH ULTRA TEST) test strip Test blood sugar daily. Dx code: E11.9 100 each 3  . glucose monitoring kit (FREESTYLE) monitoring kit 1 each by Does not apply route as needed for other. 1 each 0  . metFORMIN (GLUCOPHAGE-XR) 500 MG 24 hr tablet Take 2 tablets (1,000 mg total) by mouth daily with breakfast. 60 tablet 0  . ONETOUCH DELICA LANCETS FINE MISC TEST BLOOD SUGAR DAILY 100 each 2  . losartan (COZAAR) 25 MG tablet Take 1 tablet (25 mg total) by mouth daily. (Patient not taking: Reported on 01/11/2017) 30 tablet 5   No current facility-administered medications on file prior to visit.     Allergies  Allergen Reactions  . Erythromycin Itching     Objective:  BP (!) 143/82 (BP Location: Right Arm, Patient Position: Sitting, Cuff Size: Large)   Pulse (!) 101   Temp  98.7 F (37.1 C) (Oral)   Resp 17   Ht 5' 5.5" (1.664 m)   Wt 203 lb (92.1 kg)   SpO2 95%   BMI 33.27 kg/m   Physical Exam  Constitutional: He is oriented to person, place, and time and well-developed, well-nourished, and in no distress.  HENT:  Head: Normocephalic and atraumatic.  Eyes: Conjunctivae are normal.  Neck: Normal range of motion.  Pulmonary/Chest: Effort normal.  Musculoskeletal:       Left ankle: He exhibits decreased range of motion, swelling and ecchymosis ( over lateral malleolus). He exhibits no laceration and normal pulse. Tenderness. Lateral malleolus, medial malleolus, AITFL and head of 5th metatarsal tenderness found. Achilles tendon normal.       Left lower leg: He exhibits no tenderness, no bony tenderness, no swelling and no edema.       Left foot: There is swelling. There is normal capillary refill.  Neurological: He is alert and oriented to person, place, and time. Gait normal.  Skin: Skin is warm and dry.  Psychiatric: Affect normal.  Vitals reviewed.  Dg Tibia/fibula Left  Result Date: 01/11/2017 CLINICAL DATA:  Injury, not specified. EXAM: LEFT TIBIA AND FIBULA - 2 VIEW COMPARISON:  None. FINDINGS: There is an oblique fracture of the distal fibula, nondisplaced. No fracture of the tibia identified on these two views. Ankle study recommended. IMPRESSION: Nondisplaced oblique fracture of the distal fibula. Electronically Signed   By: Nelson Chimes M.D.   On:  01/11/2017 18:04   Dg Ankle Complete Left  Result Date: 01/11/2017 CLINICAL DATA:  66 year old male injured ankle 2 days ago. Initial encounter. EXAM: LEFT ANKLE COMPLETE - 3+ VIEW COMPARISON:  None. FINDINGS: Oblique spiral type fracture distal left fibula. Fracture anterior inferior left tibia. Plantar spur.  Tiny spur Achilles tendon insertion site. IMPRESSION: Oblique spiral type fracture distal left fibula. Fracture anterior inferior left tibia. Electronically Signed   By: Genia Del M.D.   On:  01/11/2017 18:03   Dg Foot Complete Left  Result Date: 01/11/2017 CLINICAL DATA:  Injury, type not specified. EXAM: LEFT FOOT - COMPLETE 3+ VIEW COMPARISON:  None. FINDINGS: No fracture of the foot is identified. There is chronic osteoarthritis at the metatarsal phalangeal joint of the great toe. Small plantar calcaneal spur. Mild midfoot degenerative changes. Question acute fracture at the anterior inferior corner of the tibia. There is a spiral fracture of the distal fibula. Ankle films would be suggested. IMPRESSION: Question acute fracture at the anterior inferior corner of the tibia. Spiral fracture of the distal fibula. Ankle films would evaluate this to better advantage. Electronically Signed   By: Nelson Chimes M.D.   On: 01/11/2017 18:00    Assessment and Plan :   1. Injury of left ankle, initial encounter - DG Ankle Complete Left; Future - DG Foot Complete Left; Future - DG Tibia/Fibula Left; Future  2. Closed fracture of distal end of left fibula, unspecified fracture morphology, initial encounter Tripanel splint applied (including posterior and stirrup). Crutches given in office. Instructed to be non weight bearing. Urgent referral placed for orthopedics. Use naproxen as prescribed. Encouraged to call if pain is not managed with naproxen.  Apply splint - Ambulatory referral to Orthopedic Surgery - naproxen (NAPROSYN) 500 MG tablet; Take 1 tablet (500 mg total) by mouth 2 (two) times daily with a meal.  Dispense: 30 tablet; Refill: 0 3. Closed fracture of distal end of left tibia, unspecified fracture morphology, initial encounter Apply splint - Ambulatory referral to Orthopedic Surgery - naproxen (NAPROSYN) 500 MG tablet; Take 1 tablet (500 mg total) by mouth 2 (two) times daily with a meal.  Dispense: 30 tablet; Refill: 0   Tenna Delaine PA-C  [facility] 01/11/2017 6:52 PM

## 2017-01-11 NOTE — Progress Notes (Signed)
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

## 2017-01-12 ENCOUNTER — Encounter: Payer: Self-pay | Admitting: Physician Assistant

## 2017-02-10 ENCOUNTER — Other Ambulatory Visit: Payer: Self-pay | Admitting: Family Medicine

## 2017-02-11 ENCOUNTER — Other Ambulatory Visit: Payer: Self-pay

## 2017-02-11 DIAGNOSIS — L409 Psoriasis, unspecified: Secondary | ICD-10-CM

## 2017-02-11 MED ORDER — METFORMIN HCL ER 500 MG PO TB24: 1000.0000 mg | ORAL_TABLET | Freq: Every day | ORAL | 0 refills | Status: DC

## 2017-02-11 MED ORDER — FLUOCINONIDE 0.1 % EX CREA: 1.0000 "application " | TOPICAL_CREAM | Freq: Two times a day (BID) | CUTANEOUS | 0 refills | Status: DC | PRN

## 2017-02-11 NOTE — Telephone Encounter (Signed)
Spoke with pt.  He hasn't had refill since 07/2016. He states he has been controlling sugars with his diet. Recently broke his ankle and has appt to get off the scooter which will make it easier to get around.  Pt made appt with Dr. Tamala Julian  - I sent in refills to last pt until appt 7/2.

## 2017-03-01 ENCOUNTER — Encounter: Payer: Self-pay | Admitting: Family Medicine

## 2017-03-01 ENCOUNTER — Ambulatory Visit (INDEPENDENT_AMBULATORY_CARE_PROVIDER_SITE_OTHER): Payer: 59 | Admitting: Family Medicine

## 2017-03-01 VITALS — BP 133/83 | HR 79 | Temp 97.0°F | Resp 18 | Ht 65.55 in | Wt 197.0 lb

## 2017-03-01 DIAGNOSIS — Z136 Encounter for screening for cardiovascular disorders: Secondary | ICD-10-CM

## 2017-03-01 DIAGNOSIS — E119 Type 2 diabetes mellitus without complications: Secondary | ICD-10-CM | POA: Diagnosis not present

## 2017-03-01 DIAGNOSIS — K219 Gastro-esophageal reflux disease without esophagitis: Secondary | ICD-10-CM

## 2017-03-01 DIAGNOSIS — Z0000 Encounter for general adult medical examination without abnormal findings: Secondary | ICD-10-CM | POA: Diagnosis not present

## 2017-03-01 DIAGNOSIS — K591 Functional diarrhea: Secondary | ICD-10-CM | POA: Diagnosis not present

## 2017-03-01 DIAGNOSIS — Z125 Encounter for screening for malignant neoplasm of prostate: Secondary | ICD-10-CM | POA: Diagnosis not present

## 2017-03-01 LAB — POCT URINALYSIS DIP (MANUAL ENTRY)
Bilirubin, UA: NEGATIVE
Blood, UA: NEGATIVE
Glucose, UA: NEGATIVE mg/dL
Ketones, POC UA: NEGATIVE mg/dL
Leukocytes, UA: NEGATIVE
Nitrite, UA: NEGATIVE
Protein Ur, POC: NEGATIVE mg/dL
Spec Grav, UA: 1.025 (ref 1.010–1.025)
Urobilinogen, UA: 0.2 E.U./dL
pH, UA: 5 (ref 5.0–8.0)

## 2017-03-01 MED ORDER — METFORMIN HCL ER 500 MG PO TB24
1000.0000 mg | ORAL_TABLET | Freq: Every day | ORAL | 1 refills | Status: DC
Start: 2017-03-01 — End: 2017-08-18

## 2017-03-01 NOTE — Patient Instructions (Addendum)
   IF you received an x-ray today, you will receive an invoice from [facility]. Please contact [facility] at [phone] with questions or concerns regarding your invoice.   IF you received labwork today, you will receive an invoice from [facility]. Please contact [facility] at [phone] with questions or concerns regarding your invoice.   Our billing staff will not be able to assist you with questions regarding bills from these companies.  You will be contacted with the lab results as soon as they are available. The fastest way to get your results is to activate your My Chart account. Instructions are located on the last page of this paperwork. If you have not heard from us regarding the results in 2 weeks, please contact this office.      Preventive Care 40-64 Years, Male Preventive care refers to lifestyle choices and visits with your health care provider that can promote health and wellness. What does preventive care include?  A yearly physical exam. This is also called an annual well check.  Dental exams once or twice a year.  Routine eye exams. Ask your health care provider how often you should have your eyes checked.  Personal lifestyle choices, including: ? Daily care of your teeth and gums. ? Regular physical activity. ? Eating a healthy diet. ? Avoiding tobacco and drug use. ? Limiting alcohol use. ? Practicing safe sex. ? Taking low-dose aspirin every day starting at age 50. What happens during an annual well check? The services and screenings done by your health care provider during your annual well check will depend on your age, overall health, lifestyle risk factors, and family history of disease. Counseling Your health care provider may ask you questions about your:  Alcohol use.  Tobacco use.  Drug use.  Emotional well-being.  Home and relationship well-being.  Sexual activity.  Eating habits.  Work and work  environment.  Screening You may have the following tests or measurements:  Height, weight, and BMI.  Blood pressure.  Lipid and cholesterol levels. These may be checked every 5 years, or more frequently if you are over 50 years old.  Skin check.  Lung cancer screening. You may have this screening every year starting at age 55 if you have a 30-pack-year history of smoking and currently smoke or have quit within the past 15 years.  Fecal occult blood test (FOBT) of the stool. You may have this test every year starting at age 50.  Flexible sigmoidoscopy or colonoscopy. You may have a sigmoidoscopy every 5 years or a colonoscopy every 10 years starting at age 50.  Prostate cancer screening. Recommendations will vary depending on your family history and other risks.  Hepatitis C blood test.  Hepatitis B blood test.  Sexually transmitted disease (STD) testing.  Diabetes screening. This is done by checking your blood sugar (glucose) after you have not eaten for a while (fasting). You may have this done every 1-3 years.  Discuss your test results, treatment options, and if necessary, the need for more tests with your health care provider. Vaccines Your health care provider may recommend certain vaccines, such as:  Influenza vaccine. This is recommended every year.  Tetanus, diphtheria, and acellular pertussis (Tdap, Td) vaccine. You may need a Td booster every 10 years.  Varicella vaccine. You may need this if you have not been vaccinated.  Zoster vaccine. You may need this after age 60.  Measles, mumps, and rubella (MMR) vaccine. You may need at least one dose   of MMR if you were born in 1957 or later. You may also need a second dose.  Pneumococcal 13-valent conjugate (PCV13) vaccine. You may need this if you have certain conditions and have not been vaccinated.  Pneumococcal polysaccharide (PPSV23) vaccine. You may need one or two doses if you smoke cigarettes or if you have  certain conditions.  Meningococcal vaccine. You may need this if you have certain conditions.  Hepatitis A vaccine. You may need this if you have certain conditions or if you travel or work in places where you may be exposed to hepatitis A.  Hepatitis B vaccine. You may need this if you have certain conditions or if you travel or work in places where you may be exposed to hepatitis B.  Haemophilus influenzae type b (Hib) vaccine. You may need this if you have certain risk factors.  Talk to your health care provider about which screenings and vaccines you need and how often you need them. This information is not intended to replace advice given to you by your health care provider. Make sure you discuss any questions you have with your health care provider. Document Released: 09/13/2015 Document Revised: 05/06/2016 Document Reviewed: 06/18/2015 Elsevier Interactive Patient Education  2017 Elsevier Inc.  

## 2017-03-01 NOTE — Progress Notes (Signed)
Subjective:    Patient ID: Aaron [name], male    DOB: 1951/06/14, 66 y.o.   MRN: [id number]  HPI This 66 y.o. male presents for Complete Physical Examination.  Last physical:  11-13-2015 Colonoscopy: 09-07-2007  WNL; [facility]ans; internal hemorrhoids Eye exam:  [place] 05/2016; [place] Dental exam:  Spring 2018 in [place]; [place].   L ankle fracture: follow up with [facility] in two weeks.  GERD: s/p evaluation by [facility]; started PPI.  Diarrhea: thinks may have IBS diarrhea.  IB guard helps with indigestion and gas.  Peppermint based product OTC?  Tries to watch diet.  DMII: bad with Metformin; takes sporadically. Has glucometer; checking fasting 120-125.     Immunization History  Administered Date(s) Administered  . Influenza,inj,Quad PF,36+ Mos 06/28/2014, 04/29/2015, 07/13/2016  . Pneumococcal Conjugate-13 04/29/2015  . Pneumococcal Polysaccharide-23 04/06/2014  . Td 03/30/2016  . Tdap 12/21/2005  . Zoster 11/25/2015   BP Readings from Last 3 Encounters:  03/01/17 133/83  01/11/17 (!) 143/82  07/13/16 138/76   Wt Readings from Last 3 Encounters:  03/01/17 197 lb (89.4 kg)  01/11/17 203 lb (92.1 kg)  07/13/16 203 lb 6.4 oz (92.3 kg)     Review of Systems  Constitutional: Negative.  Negative for activity change, appetite change, chills, diaphoresis, fatigue, fever and unexpected weight change.  HENT: Negative.  Negative for congestion, dental problem, drooling, ear discharge, ear pain, facial swelling, hearing loss, mouth sores, nosebleeds, postnasal drip, rhinorrhea, sinus pressure, sneezing, sore throat, tinnitus, trouble swallowing and voice change.   Eyes: Negative.  Negative for photophobia, pain, discharge, redness, itching and visual disturbance.  Respiratory: Negative.  Negative for apnea, cough, choking, chest tightness, shortness of breath, wheezing and stridor.   Cardiovascular: Negative.  Negative for chest pain, palpitations  and leg swelling.  Gastrointestinal: Positive for abdominal distention and diarrhea. Negative for abdominal pain, blood in stool, constipation, nausea and vomiting.  Endocrine: Negative.  Negative for cold intolerance, heat intolerance, polydipsia, polyphagia and polyuria.  Genitourinary: Negative.  Negative for decreased urine volume, difficulty urinating, discharge, dysuria, enuresis, flank pain, frequency, genital sores, hematuria, penile pain, penile swelling, scrotal swelling, testicular pain and urgency.       Nocturia x 2; urinary stream is strong.    Musculoskeletal: Positive for back pain. Negative for arthralgias, gait problem, joint swelling, myalgias, neck pain and neck stiffness.  Skin: Negative.  Negative for color change, pallor, rash and wound.  Allergic/Immunologic: Negative.  Negative for environmental allergies, food allergies and immunocompromised state.  Neurological: Negative.  Negative for dizziness, tremors, seizures, syncope, facial asymmetry, speech difficulty, weakness, light-headedness, numbness and headaches.  Hematological: Negative.  Negative for adenopathy. Does not bruise/bleed easily.  Psychiatric/Behavioral: Negative.  Negative for agitation, behavioral problems, confusion, decreased concentration, dysphoric mood, hallucinations, self-injury, sleep disturbance and suicidal ideas. The patient is not nervous/anxious and is not hyperactive.        Bedtime late 12:00am; wakes up around 7:00am.    Past Medical History:  Diagnosis Date  . Diabetes mellitus without complication ([place])   . GERD (gastroesophageal reflux disease)    History reviewed. No pertinent surgical history. Allergies  Allergen Reactions  . Erythromycin Itching   Social History   Social History  . Marital status: Married    Spouse name: N/A  . Number of children: N/A  . Years of education: N/A   Occupational History  . Sales    Social History Main Topics  . Smoking status: Former  Smoker  . Smokeless tobacco: Never Used  . Alcohol use 0.0 oz/week     Comment: 2-3 beers a week  . Drug use: No  . Sexual activity: Yes   Other Topics Concern  . Not on file   Social History Narrative   Marital status:  Married.       Children:   none      Lives:      Employment:  Press photographer      Tobacco:  none      Alcohol:  5-6 drinks per week      Drugs:   none      Exercise:      Seatbelt:      Education: [employer].   Family History  Problem Relation Age of Onset  . Cancer Mother        bone marrow cancer  . Cancer Father 5       brain cancer       Objective:   Physical Exam  Constitutional: He is oriented to person, place, and time. He appears well-developed and well-nourished. No distress.  HENT:  Head: Normocephalic and atraumatic.  Right Ear: External ear normal.  Left Ear: External ear normal.  Nose: Nose normal.  Mouth/Throat: Oropharynx is clear and moist.  Eyes: Conjunctivae and EOM are normal. Pupils are equal, round, and reactive to light.  Neck: Normal range of motion. Neck supple. Carotid bruit is not present. No thyromegaly present.  Cardiovascular: Normal rate, regular rhythm, normal heart sounds and intact distal pulses.  Exam reveals no gallop and no friction rub.   No murmur heard. Pulmonary/Chest: Effort normal and breath sounds normal. He has no wheezes. He has no rales.  Abdominal: Soft. Bowel sounds are normal. He exhibits no distension and no mass. There is no tenderness. There is no rebound and no guarding.  Musculoskeletal:       Right shoulder: Normal.       Left shoulder: Normal.       Cervical back: Normal.  Lymphadenopathy:    He has no cervical adenopathy.  Neurological: He is alert and oriented to person, place, and time. He has normal reflexes. No cranial nerve deficit. He exhibits normal muscle tone. Coordination normal.  Skin: Skin is warm and dry. No rash noted. He is not diaphoretic.  Psychiatric: He has a normal mood and  affect. His behavior is normal. Judgment and thought content normal.   Depression screen [facility] 2/9 07/13/2016 03/30/2016 01/08/2016 11/13/2015 08/01/2015  Decreased Interest 0 0 0 0 0  Down, Depressed, Hopeless 0 0 0 0 0  PHQ - 2 Score 0 0 0 0 0   Fall Risk  03/01/2017 07/13/2016 03/30/2016 01/08/2016 11/13/2015  Falls in the past year? Yes No Yes No Yes  Number falls in past yr: 1 - 1 - 2 or more  Injury with Fall? Yes - Yes - -  Risk Factor Category  - - High Fall Risk - -       Assessment & Plan:   1. Routine physical examination   2. Type 2 diabetes mellitus without complication, without long-term current use of insulin ([place])   3. Screening for prostate cancer   4. Screening for cardiovascular condition   5. Gastroesophageal reflux disease without esophagitis   6. Functional diarrhea    -anticipatory guidance provided --- exercise, weight loss, safe driving practices, aspirin 81mg  daily. -obtain age appropriate screening labs and labs for chronic disease management.    Orders Placed  This Encounter  Procedures  . CBC with Differential/Platelet  . Comprehensive metabolic panel  . Hemoglobin A1c  . Lipid panel  . TSH  . PSA  . Microalbumin, urine  . POCT urinalysis dipstick  . EKG 12-Lead   Meds ordered this encounter  Medications  . aspirin EC 81 MG tablet    Sig: Take 81 mg by mouth daily.  . metFORMIN (GLUCOPHAGE-XR) 500 MG 24 hr tablet    Sig: Take 2 tablets (1,000 mg total) by mouth daily with breakfast.    Dispense:  180 tablet    Refill:  1   Norwood Levo, M.D. [facility] at [facility] [address] [place],   [id number] [phone] phone [phone] fax

## 2017-03-02 LAB — CBC WITH DIFFERENTIAL/PLATELET
Basophils Absolute: 0 10*3/uL (ref 0.0–0.2)
Basos: 0 %
EOS (ABSOLUTE): 0.1 10*3/uL (ref 0.0–0.4)
Eos: 1 %
Hematocrit: 48.4 % (ref 37.5–51.0)
Hemoglobin: 15.7 g/dL (ref 13.0–17.7)
Immature Grans (Abs): 0 10*3/uL (ref 0.0–0.1)
Immature Granulocytes: 0 %
Lymphocytes Absolute: 2.1 10*3/uL (ref 0.7–3.1)
Lymphs: 31 %
MCH: 27.1 pg (ref 26.6–33.0)
MCHC: 32.4 g/dL (ref 31.5–35.7)
MCV: 83 fL (ref 79–97)
Monocytes Absolute: 0.4 10*3/uL (ref 0.1–0.9)
Monocytes: 6 %
Neutrophils Absolute: 4.3 10*3/uL (ref 1.4–7.0)
Neutrophils: 62 %
Platelets: 266 10*3/uL (ref 150–379)
RBC: 5.8 x10E6/uL (ref 4.14–5.80)
RDW: 14.3 % (ref 12.3–15.4)
WBC: 6.9 10*3/uL (ref 3.4–10.8)

## 2017-03-02 LAB — COMPREHENSIVE METABOLIC PANEL
ALT: 26 IU/L (ref 0–44)
AST: 19 IU/L (ref 0–40)
Albumin/Globulin Ratio: 1.6 (ref 1.2–2.2)
Albumin: 4.2 g/dL (ref 3.6–4.8)
Alkaline Phosphatase: 97 IU/L (ref 39–117)
BUN/Creatinine Ratio: 16 (ref 10–24)
BUN: 13 mg/dL (ref 8–27)
Bilirubin Total: 0.4 mg/dL (ref 0.0–1.2)
CO2: 22 mmol/L (ref 20–29)
Calcium: 9.3 mg/dL (ref 8.6–10.2)
Chloride: 101 mmol/L (ref 96–106)
Creatinine, Ser: 0.83 mg/dL (ref 0.76–1.27)
GFR calc Af Amer: 107 mL/min/{1.73_m2} (ref >59)
GFR calc non Af Amer: 93 mL/min/{1.73_m2} (ref >59)
Globulin, Total: 2.7 g/dL (ref 1.5–4.5)
Glucose: 182 mg/dL — ABNORMAL HIGH (ref 65–99)
Potassium: 4.5 mmol/L (ref 3.5–5.2)
Sodium: 141 mmol/L (ref 134–144)
Total Protein: 6.9 g/dL (ref 6.0–8.5)

## 2017-03-02 LAB — LIPID PANEL
Chol/HDL Ratio: 4 ratio (ref 0.0–5.0)
Cholesterol, Total: 182 mg/dL (ref 100–199)
HDL: 45 mg/dL (ref >39)
LDL Calculated: 111 mg/dL — ABNORMAL HIGH (ref 0–99)
Triglycerides: 132 mg/dL (ref 0–149)
VLDL Cholesterol Cal: 26 mg/dL (ref 5–40)

## 2017-03-02 LAB — HEMOGLOBIN A1C
Est. average glucose Bld gHb Est-mCnc: 171 mg/dL
Hgb A1c MFr Bld: 7.6 % — ABNORMAL HIGH (ref 4.8–5.6)

## 2017-03-02 LAB — MICROALBUMIN, URINE: Microalbumin, Urine: 5.4 ug/mL

## 2017-03-02 LAB — PSA: Prostate Specific Ag, Serum: 1.6 ng/mL (ref 0.0–4.0)

## 2017-03-02 LAB — TSH: TSH: 2.47 u[IU]/mL (ref 0.450–4.500)

## 2017-03-13 DIAGNOSIS — K591 Functional diarrhea: Secondary | ICD-10-CM | POA: Insufficient documentation

## 2017-03-13 DIAGNOSIS — K219 Gastro-esophageal reflux disease without esophagitis: Secondary | ICD-10-CM | POA: Insufficient documentation

## 2017-03-13 DIAGNOSIS — E785 Hyperlipidemia, unspecified: Secondary | ICD-10-CM | POA: Insufficient documentation

## 2017-03-13 DIAGNOSIS — E1165 Type 2 diabetes mellitus with hyperglycemia: Secondary | ICD-10-CM

## 2017-03-13 DIAGNOSIS — E1169 Type 2 diabetes mellitus with other specified complication: Secondary | ICD-10-CM | POA: Insufficient documentation

## 2017-05-31 ENCOUNTER — Ambulatory Visit (INDEPENDENT_AMBULATORY_CARE_PROVIDER_SITE_OTHER): Payer: 59 | Admitting: Urgent Care

## 2017-05-31 ENCOUNTER — Ambulatory Visit (INDEPENDENT_AMBULATORY_CARE_PROVIDER_SITE_OTHER): Payer: 59

## 2017-05-31 ENCOUNTER — Encounter: Payer: Self-pay | Admitting: Urgent Care

## 2017-05-31 VITALS — BP 138/90 | HR 96 | Temp 98.2°F | Resp 18 | Ht 65.5 in | Wt 202.2 lb

## 2017-05-31 DIAGNOSIS — M545 Low back pain, unspecified: Secondary | ICD-10-CM

## 2017-05-31 DIAGNOSIS — Z23 Encounter for immunization: Secondary | ICD-10-CM

## 2017-05-31 DIAGNOSIS — E119 Type 2 diabetes mellitus without complications: Secondary | ICD-10-CM

## 2017-05-31 DIAGNOSIS — R21 Rash and other nonspecific skin eruption: Secondary | ICD-10-CM | POA: Diagnosis not present

## 2017-05-31 MED ORDER — CLOTRIMAZOLE-BETAMETHASONE 1-0.05 % EX CREA: 1.0000 "application " | TOPICAL_CREAM | Freq: Two times a day (BID) | CUTANEOUS | 0 refills | Status: DC

## 2017-05-31 MED ORDER — FLUCONAZOLE 150 MG PO TABS: 150.0000 mg | ORAL_TABLET | ORAL | 0 refills | Status: DC

## 2017-05-31 MED ORDER — MELOXICAM 7.5 MG PO TABS: 7.5000 mg | ORAL_TABLET | Freq: Every day | ORAL | 0 refills | Status: DC

## 2017-05-31 NOTE — Progress Notes (Signed)
  MRN: [id number] DOB: [birth year]  Subjective:   Aaron [name] is a 66 y.o. male presenting for chief complaint of Foot Problem (possible athletes foot(left) x1 month; OTC not working) and Flu Vaccine  Reports 1 month history of rash over his left foot. Rash is very itchy, now having pain. Admits that he gets his feet wet very regularly when he goes to swimming pool. Patient has tried otc Lamisil, old supply of ketoconazole cream. Denies fever, redness, drainage of pus or bleeding. He is diabetic, last a1c was 7.6%. He also reports low back pain that radiates into his leg bilaterally. Has associated numbness and tingling with the shooting pain. Denies falls, trauma to his back, incontinence, hematuria.  Aaron [name] has a current medication list which includes the following prescription(s): glucose blood, glucose monitoring kit, metformin, and onetouch delica lancets fine. Also is allergic to erythromycin.  Aaron [name]  has a past medical history of Diabetes mellitus without complication ([place]) and GERD (gastroesophageal reflux disease). Also  has no past surgical history on file.  Objective:   Vitals: BP 138/90   Pulse 96   Temp 98.2 F (36.8 C) (Oral)   Resp 18   Ht 5' 5.5" (1.664 m)   Wt 202 lb 3.2 oz (91.7 kg)   SpO2 96%   BMI 33.14 kg/m   Physical Exam  Constitutional: He is oriented to person, place, and time. He appears well-developed and well-nourished.  Cardiovascular: Normal rate.   Pulmonary/Chest: Effort normal.  Musculoskeletal:       Lumbar back: He exhibits normal range of motion, no tenderness, no bony tenderness, no swelling, no edema, no deformity, no laceration, no pain, no spasm and normal pulse.  Neurological: He is alert and oriented to person, place, and time.  Skin:      Dg Lumbar Spine Complete  Result Date: 05/31/2017 CLINICAL DATA:  Acute bilateral low back pain EXAM: LUMBAR SPINE - COMPLETE 4+ VIEW COMPARISON:  None. FINDINGS: There is no evidence of lumbar  spine fracture. Alignment is normal. Intervertebral disc spaces are maintained. Bilateral facet arthropathy at L5-S1. IMPRESSION: No acute osseous injury of the lumbar spine. Electronically Signed   By: Kathreen Devoid   On: 05/31/2017 15:19    Assessment and Plan :   1. Rash and nonspecific skin eruption - Will start managing patient for fungal infection and use betamethasone for the itching. Follow up in 1 week. - fluconazole (DIFLUCAN) 150 MG tablet; Take 1 tablet (150 mg total) by mouth once a week.  Dispense: 6 tablet; Refill: 0  2. Type 2 diabetes mellitus without complication, without long-term current use of insulin ([place]) - Patient should set up a f/u visit for a recheck of his a1c level since he is having numbness and tingling sensations and his last 2 a1c levels have trended up. He will set up f/u with Dr. Tamala Julian.  3. Acute bilateral low back pain without sciatica - Will start conservative management with meloxicam. Back care reviewed. rtc in 1-2 weeks if no improvement.  4. Need for influenza vaccination - Flu Vaccine QUAD 6+ mos PF IM (Fluarix Quad PF)   Jaynee Eagles, PA-C [facility] at [facility] [phone] 05/31/2017  2:53 PM

## 2017-05-31 NOTE — Patient Instructions (Addendum)
Athlete's Foot Athlete's foot (tinea pedis) is a fungal infection of the skin on the feet. It often occurs on the skin that is between or underneath the toes. It can also occur on the soles of the feet. The infection can spread from person to person (is contagious). Follow these instructions at home:  Apply or take over-the-counter and prescription medicines only as told by your doctor.  Keep all follow-up visits as told by your doctor. This is important.  Do not scratch your feet.  Keep your feet dry: ? Wear cotton or wool socks. Change your socks every day or if they become wet. ? Wear shoes that allow air to move around, such as sandals or canvas tennis shoes.  Wash and dry your feet: ? Every day or as told by your doctor. ? After exercising. ? Including the area between your toes.  Wear sandals in wet areas, such as locker rooms and shared showers.  Do not share any of these items: ? Towels. ? Nail clippers. ? Other personal items that touch your feet.  If you have diabetes, keep your blood sugar under control. Contact a doctor if:  You have a fever.  You have swelling, soreness, warmth, or redness in your foot.  You are not getting better with treatment.  Your symptoms get worse.  You have new symptoms. This information is not intended to replace advice given to you by your health care provider. Make sure you discuss any questions you have with your health care provider. Document Released: 02/03/2008 Document Revised: 01/23/2016 Document Reviewed: 02/18/2015 Elsevier Interactive Patient Education  2018 [employer].     IF you received an x-ray today, you will receive an invoice from [facility]. Please contact [facility] at [phone] with questions or concerns regarding your invoice.   IF you received labwork today, you will receive an invoice from [facility]. Please contact [facility] at [phone] with questions or concerns regarding your  invoice.   Our billing staff will not be able to assist you with questions regarding bills from these companies.  You will be contacted with the lab results as soon as they are available. The fastest way to get your results is to activate your My Chart account. Instructions are located on the last page of this paperwork. If you have not heard from [place] regarding the results in 2 weeks, please contact this office.

## 2017-06-08 ENCOUNTER — Encounter: Payer: Self-pay | Admitting: Urgent Care

## 2017-06-08 ENCOUNTER — Ambulatory Visit (INDEPENDENT_AMBULATORY_CARE_PROVIDER_SITE_OTHER): Payer: 59 | Admitting: Urgent Care

## 2017-06-08 VITALS — BP 120/80 | HR 82 | Temp 97.7°F | Resp 16 | Ht 65.5 in | Wt 202.6 lb

## 2017-06-08 DIAGNOSIS — B353 Tinea pedis: Secondary | ICD-10-CM

## 2017-06-08 DIAGNOSIS — M545 Low back pain, unspecified: Secondary | ICD-10-CM

## 2017-06-08 DIAGNOSIS — R21 Rash and other nonspecific skin eruption: Secondary | ICD-10-CM

## 2017-06-08 MED ORDER — CELECOXIB 100 MG PO CAPS: 100.0000 mg | ORAL_CAPSULE | Freq: Two times a day (BID) | ORAL | 1 refills | Status: DC

## 2017-06-08 NOTE — Patient Instructions (Addendum)
Celecoxib capsules  What is this medicine?  CELECOXIB (sell a KOX ib) is a non-steroidal anti-inflammatory drug (NSAID). This medicine is used to treat arthritis and ankylosing spondylitis. It may be also used for pain or painful monthly periods.  This medicine may be used for other purposes; ask your health care provider or pharmacist if you have questions.  COMMON BRAND NAME(S): Celebrex  What should I tell my health care provider before I take this medicine?  They need to know if you have any of these conditions:  -asthma  -coronary artery bypass graft (CABG) surgery within the past 2 weeks  -drink more than 3 alcohol-containing drinks a day  -heart disease or circulation problems like heart failure or leg edema (fluid retention)  -high blood pressure  -kidney disease  -liver disease  -stomach bleeding or ulcers  -an unusual or allergic reaction to celecoxib, sulfa drugs, aspirin, other NSAIDs, other medicines, foods, dyes, or preservatives  -pregnant or trying to get pregnant  -breast-feeding  How should I use this medicine?  Take this medicine by mouth with a full glass of water. Follow the directions on the prescription label. Take it with food if it upsets your stomach or if you take 400 mg at one time. Try to not lie down for at least 10 minutes after you take the medicine. Take the medicine at the same time each day. Do not take more medicine than you are told to take. Long-term, continuous use may increase the risk of heart attack or stroke.  A special MedGuide will be given to you by the pharmacist with each prescription and refill. Be sure to read this information carefully each time.  Talk to your pediatrician regarding the use of this medicine in children. Special care may be needed.  Overdosage: If you think you have taken too much of this medicine contact a poison control center or emergency room at once.  NOTE: This medicine is only for you. Do not share this medicine with others.  What if I miss  a dose?  If you miss a dose, take it as soon as you can. If it is almost time for your next dose, take only that dose. Do not take double or extra doses.  What may interact with this medicine?  Do not take this medicine with any of the following medications:  -cidofovir  -methotrexate  -other NSAIDs, medicines for pain and inflammation, like ibuprofen or naproxen  -pemetrexed  This medicine may also interact with the following medications:  -alcohol  -aspirin and aspirin-like drugs  -diuretics  -fluconazole  -lithium  -medicines for high blood pressure  -steroid medicines like prednisone or cortisone  -warfarin  This list may not describe all possible interactions. Give your health care provider a list of all the medicines, herbs, non-prescription drugs, or dietary supplements you use. Also tell them if you smoke, drink alcohol, or use illegal drugs. Some items may interact with your medicine.  What should I watch for while using this medicine?  Tell your doctor or health care professional if your pain does not get better. Talk to your doctor before taking another medicine for pain. Do not treat yourself.  This medicine does not prevent heart attack or stroke. In fact, this medicine may increase the chance of a heart attack or stroke. The chance may increase with longer use of this medicine and in people who have heart disease. If you take aspirin to prevent heart attack or stroke, talk with your   medicine. This medicine can cause ulcers and bleeding in the stomach and intestines at any time during treatment. Ulcers and bleeding can happen without warning symptoms and can cause death. What side effects may I notice from receiving this medicine? Side  effects that you should report to your doctor or health care professional as soon as possible: -allergic reactions like skin rash, itching or hives, swelling of the face, lips, or tongue -black or bloody stools, blood in the urine or vomit -blurred vision -breathing problems -chest pain -nausea, vomiting -problems with balance, talking, walking -redness, blistering, peeling or loosening of the skin, including inside the mouth -unexplained weight gain or swelling -unusually weak or tired -yellowing of eyes, skin Side effects that usually do not require medical attention (report to your doctor or health care professional if they continue or are bothersome): -constipation or diarrhea -dizziness -gas or heartburn -upset stomach This list may not describe all possible side effects. Call your doctor for medical advice about side effects. You may report side effects to FDA at 1-800-FDA-1088. Where should I keep my medicine? Keep out of the reach of children. Store at room temperature between 15 and 30 degrees C (59 and 86 degrees F). Keep container tightly closed. Throw away any unused medicine after the expiration date. NOTE: This sheet is a summary. It may not cover all possible information. If you have questions about this medicine, talk to your doctor, pharmacist, or health care provider.  2018 Elsevier/Gold Standard (2009-10-16 10:54:17)     Athlete's Foot Athlete's foot (tinea pedis) is a fungal infection of the skin on the feet. It often occurs on the skin that is between or underneath the toes. It can also occur on the soles of the feet. The infection can spread from person to person (is contagious). Follow these instructions at home:  Apply or take over-the-counter and prescription medicines only as told by your doctor.  Keep all follow-up visits as told by your doctor. This is important.  Do not scratch your feet.  Keep your feet dry: ? Wear cotton or wool socks. Change your  socks every day or if they become wet. ? Wear shoes that allow air to move around, such as sandals or canvas tennis shoes.  Wash and dry your feet: ? Every day or as told by your doctor. ? After exercising. ? Including the area between your toes.  Wear sandals in wet areas, such as locker rooms and shared showers.  Do not share any of these items: ? Towels. ? Nail clippers. ? Other personal items that touch your feet.  If you have diabetes, keep your blood sugar under control. Contact a doctor if:  You have a fever.  You have swelling, soreness, warmth, or redness in your foot.  You are not getting better with treatment.  Your symptoms get worse.  You have new symptoms. This information is not intended to replace advice given to you by your health care provider. Make sure you discuss any questions you have with your health care provider. Document Released: 02/03/2008 Document Revised: 01/23/2016 Document Reviewed: 02/18/2015 Elsevier Interactive Patient Education  2018 [employer].     IF you received an x-ray today, you will receive an invoice from [facility]. Please contact [facility] Radiology at [phone] with questions or concerns regarding your invoice.   IF you received labwork today, you will receive an invoice from [facility]. Please contact [facility] at [phone] with questions or concerns regarding your invoice.   Our billing  staff will not be able to assist you with questions regarding bills from these companies.  You will be contacted with the lab results as soon as they are available. The fastest way to get your results is to activate your My Chart account. Instructions are located on the last page of this paperwork. If you have not heard from us regarding the results in 2 weeks, please contact this office.      

## 2017-06-08 NOTE — Progress Notes (Signed)
    MRN: [id number] DOB: [birth year]  Subjective:   Aaron [name] is a 66 y.o. male presenting for follow up on rash and back pain. Reports that there is improvement in his rash of his 3rd-4th left toes. Has been using clotrimazole-betamethasone and fluconazole. Also has been using meloxicam but admits that this caused significant stomach upset. Admits that his back pain is better however.   Aaron [name] has a current medication list which includes the following prescription(s): clotrimazole-betamethasone, glucose blood, glucose monitoring kit, metformin, onetouch delica lancets fine, and fluconazole. Also is allergic to erythromycin.  Aaron [name]  has a past medical history of Diabetes mellitus without complication ([place]) and GERD (gastroesophageal reflux disease). Also  has no past surgical history on file.  Objective:   Vitals: BP 120/80   Pulse 82   Temp 97.7 F (36.5 C) (Oral)   Resp 16   Ht 5' 5.5" (1.664 m)   Wt 202 lb 9.6 oz (91.9 kg)   SpO2 96%   BMI 33.20 kg/m   Physical Exam  Constitutional: He is oriented to person, place, and time. He appears well-developed and well-nourished.  Cardiovascular: Normal rate.   Pulmonary/Chest: Effort normal.  Neurological: He is alert and oriented to person, place, and time.  Skin:       Assessment and Plan :   1. Rash and nonspecific skin eruption 2. Tinea pedis of left foot - Improving, continue cream for 1 more week, fluconazole for 2 more weeks. Recheck at that point if there is no improvement.  3. Acute bilateral low back pain without sciatica - Start Celebrex as needed. Counseled patient on potential for adverse effects with medications prescribed today, patient verbalized understanding.   Aaron Eagles, PA-C [facility] [phone] 06/08/2017 8:30 AM

## 2017-07-25 ENCOUNTER — Other Ambulatory Visit: Payer: Self-pay | Admitting: Urgent Care

## 2017-08-03 ENCOUNTER — Ambulatory Visit: Payer: 59 | Admitting: Urgent Care

## 2017-08-03 ENCOUNTER — Encounter: Payer: Self-pay | Admitting: Urgent Care

## 2017-08-03 VITALS — BP 144/84 | HR 83 | Temp 98.4°F | Resp 18 | Ht 65.5 in | Wt 205.2 lb

## 2017-08-03 DIAGNOSIS — R109 Unspecified abdominal pain: Secondary | ICD-10-CM | POA: Diagnosis not present

## 2017-08-03 DIAGNOSIS — R350 Frequency of micturition: Secondary | ICD-10-CM | POA: Diagnosis not present

## 2017-08-03 LAB — POCT URINALYSIS DIP (MANUAL ENTRY)
Bilirubin, UA: NEGATIVE
Blood, UA: NEGATIVE
Glucose, UA: NEGATIVE mg/dL
Ketones, POC UA: NEGATIVE mg/dL
Leukocytes, UA: NEGATIVE
Nitrite, UA: NEGATIVE
Protein Ur, POC: NEGATIVE mg/dL
Spec Grav, UA: 1.025 (ref 1.010–1.025)
Urobilinogen, UA: 1 E.U./dL
pH, UA: 7 (ref 5.0–8.0)

## 2017-08-03 LAB — POC MICROSCOPIC URINALYSIS (UMFC)

## 2017-08-03 MED ORDER — CYCLOBENZAPRINE HCL 5 MG PO TABS: 5.0000 mg | ORAL_TABLET | Freq: Three times a day (TID) | ORAL | 1 refills | Status: DC | PRN

## 2017-08-03 NOTE — Progress Notes (Signed)
  MRN: [id number] DOB: 02/06/1951  Subjective:   Aaron [name] is a 66 y.o. male presenting for 2-3 week history of right flank pain, urinary frequency at night. Has tried meloxicam without any relief. Has not used celecoxib, previously prescribed but was too expensive. Denies fever, dysuria, hematuria, urinary urgency, n/v, abdominal pain, pelvic pain or fullness, testicular pain, penile discharge. Admits that he does not hydrate very well, drinks caffeine beverages regularly.  Render has a current medication list which includes the following prescription(s): glucose blood, glucose monitoring kit, metformin, onetouch delica lancets fine, and celecoxib. Also is allergic to erythromycin.  Aaron [name]  has a past medical history of Diabetes mellitus without complication ([place]) and GERD (gastroesophageal reflux disease). Also  has no past surgical history on file.  Objective:   Vitals: BP (!) 144/84   Pulse 83   Temp 98.4 F (36.9 C) (Oral)   Resp 18   Ht 5' 5.5" (1.664 m)   Wt 205 lb 3.2 oz (93.1 kg)   SpO2 97%   BMI 33.63 kg/m   Physical Exam  Constitutional: He is oriented to person, place, and time. He appears well-developed and well-nourished.  HENT:  Mouth/Throat: Oropharynx is clear and moist.  Cardiovascular: Normal rate, regular rhythm and intact distal pulses. Exam reveals no gallop and no friction rub.  No murmur heard. Pulmonary/Chest: No respiratory distress. He has no wheezes. He has no rales.  Abdominal: Soft. Bowel sounds are normal. He exhibits no distension and no mass. There is no tenderness. There is no guarding.  Musculoskeletal:       Lumbar back: He exhibits tenderness (over area depicted). He exhibits normal range of motion, no bony tenderness, no swelling, no edema, no deformity, no laceration and no spasm.       Back:  Neurological: He is alert and oriented to person, place, and time.  Skin: Skin is warm and dry.  Psychiatric: He has a normal mood and affect.     Results for orders placed or performed in visit on 08/03/17 (from the past 24 hour(s))  POCT urinalysis dipstick     Status: None   Collection Time: 08/03/17  5:33 PM  Result Value Ref Range   Color, UA yellow yellow   Clarity, UA clear clear   Glucose, UA negative negative mg/dL   Bilirubin, UA negative negative   Ketones, POC UA negative negative mg/dL   Spec Grav, UA 1.025 1.010 - 1.025   Blood, UA negative negative   pH, UA 7.0 5.0 - 8.0   Protein Ur, POC negative negative mg/dL   Urobilinogen, UA 1.0 0.2 or 1.0 E.U./dL   Nitrite, UA Negative Negative   Leukocytes, UA Negative Negative  POCT Microscopic Urinalysis ([facility])     Status: Abnormal   Collection Time: 08/03/17  5:46 PM  Result Value Ref Range   WBC,UR,HPF,POC Few (A) None WBC/hpf   RBC,UR,HPF,POC None None RBC/hpf   Bacteria Few (A) None, Too numerous to count   Mucus Present (A) Absent   Epithelial Cells, UR Per Microscopy Few (A) None, Too numerous to count cells/hpf   Assessment and Plan :   1. Right flank pain 2. Urinary frequency - Labs pending, counseled patient on adequate hydration. Will discuss f/u with lab results. Return-to-clinic precautions discussed, patient verbalized understanding.   Jaynee Eagles, PA-C [facility] [phone] 08/03/2017  5:31 PM

## 2017-08-03 NOTE — Patient Instructions (Addendum)
You may take 500mg  Tylenol every 6 hours for pain and inflammation of your back. Please hydrate very well with at least 2 liters of water daily. If you develop fever, nausea, vomiting, belly pain, have frank blood in your urine, painful urination please return to our clinic for a recheck.     Flank Pain Flank pain is pain in your side. The flank is the area of your side between your upper belly (abdomen) and your back. The pain may occur over a short period of time (acute) or may be long-term or come back often (chronic). It may be mild or very bad. Pain in this area can be caused by many different things. Follow these instructions at home:  Rest as told by your doctor.  Drink enough fluid to keep your pee (urine) clear or pale yellow.  Take over-the-counter and prescription medicines only as told by your doctor.  Keep all follow-up visits as told by your doctor. This is important. Contact a doctor if:  Medicine does not help your pain.  You have new symptoms.  Your pain gets worse.  You have a fever.  Your symptoms last longer than 2-3 days. Get help right away if:  Your tummy hurts or is swollen.  You are short of breath.  You feel sick to your stomach (nauseous) and it does not go away.  You cannot stop throwing up (vomiting).  You feel like you will pass out or you do pass out (faint).  You have blood in your pee.  You have a fever and your symptoms suddenly get worse. This information is not intended to replace advice given to you by your health care provider. Make sure you discuss any questions you have with your health care provider. Document Released: 05/26/2008 Document Revised: 05/08/2016 Document Reviewed: 05/21/2015 Elsevier Interactive Patient Education  2018 [employer].  Urinary Frequency, Adult Urinary frequency means urinating more often than usual. People with urinary frequency urinate at least 8 times in 24 hours, even if they drink a normal amount  of fluid. Although they urinate more often than normal, the total amount of urine produced in a day may be normal. Urinary frequency is also called pollakiuria. What are the causes? This condition may be caused by:  A urinary tract infection.  Obesity.  Bladder problems, such as bladder stones.  Caffeine or alcohol.  Eating food or drinking fluids that irritate the bladder. These include coffee, tea, soda, artificial sweeteners, citrus, tomato-based foods, and chocolate.  Certain medicines, such as medicines that help the body get rid of extra fluid (diuretics).  Muscle or nerve weakness.  Overactive bladder.  Chronic diabetes.  Interstitial cystitis.  In men, problems with the prostate, such as an enlarged prostate.  In women, pregnancy.  In some cases, the cause may not be known. What increases the risk? This condition is more likely to develop in:  Women who have gone through menopause.  Men with prostate problems.  People with a disease or injury that affects the nerves or spinal cord.  People who have or have had a condition that affects the brain, such as a stroke.  What are the signs or symptoms? Symptoms of this condition include:  Feeling an urgent need to urinate often. The stress and anxiety of needing to find a bathroom quickly can make this urge worse.  Urinating 8 or more times in 24 hours.  Urinating as often as every 1 to 2 hours.  How is this diagnosed? This  condition is diagnosed based on your symptoms, your medical history, and a physical exam. You may have tests, such as:  Blood tests.  Urine tests.  Imaging tests, such as X-rays or ultrasounds.  A bladder test.  A test of your neurological system. This is the body system that senses the need to urinate.  A test to check for problems in the urethra and bladder called cystoscopy.  You may also be asked to keep a bladder diary. A bladder diary is a record of what you eat and drink, how  often you urinate, and how much you urinate. You may need to see a health care provider who specializes in conditions of the urinary tract (urologist) or kidneys (nephrologist). How is this treated? Treatment for this condition depends on the cause. Sometimes the condition goes away on its own and treatment is not necessary. If treatment is needed, it may include:  Taking medicine.  Learning exercises that strengthen the muscles that help control urination.  Following a bladder training program. This may include: ? Learning to delay going to the bathroom. ? Double urinating (voiding). This helps if you are not completely emptying your bladder. ? Scheduled voiding.  Making diet changes, such as: ? Avoiding caffeine. ? Drinking fewer fluids, especially alcohol. ? Not drinking in the evening. ? Not having foods or drinks that may irritate the bladder. ? Eating foods that help prevent or ease constipation. Constipation can make this condition worse.  Having the nerves in your bladder stimulated. There are two options for stimulating the nerves to your bladder: ? Outpatient electrical nerve stimulation. This is done by your health care provider. ? Surgery to implant a bladder pacemaker. The pacemaker helps to control the urge to urinate.  Follow these instructions at home:  Keep a bladder diary if told to by your health care provider.  Take over-the-counter and prescription medicines only as told by your health care provider.  Do any exercises as told by your health care provider.  Follow a bladder training program as told by your health care provider.  Make any recommended diet changes.  Keep all follow-up visits as told by your health care provider. This is important. Contact a health care provider if:  You start urinating more often.  You feel pain or irritation when you urinate.  You notice blood in your urine.  Your urine looks cloudy.  You develop a fever.  You begin  vomiting. Get help right away if:  You are unable to urinate. This information is not intended to replace advice given to you by your health care provider. Make sure you discuss any questions you have with your health care provider. Document Released: 06/13/2009 Document Revised: 09/18/2015 Document Reviewed: 03/13/2015 Elsevier Interactive Patient Education  2018 [employer].     IF you received an x-ray today, you will receive an invoice from [facility] Radiology. Please contact [facility] Radiology at [phone] with questions or concerns regarding your invoice.   IF you received labwork today, you will receive an invoice from [facility]. Please contact [facility] at [phone] with questions or concerns regarding your invoice.   Our billing staff will not be able to assist you with questions regarding bills from these companies.  You will be contacted with the lab results as soon as they are available. The fastest way to get your results is to activate your My Chart account. Instructions are located on the last page of this paperwork. If you have not heard from [place] regarding  the results in 2 weeks, please contact this office.

## 2017-08-04 LAB — BASIC METABOLIC PANEL
BUN/Creatinine Ratio: 17 (ref 10–24)
BUN: 13 mg/dL (ref 8–27)
CO2: 25 mmol/L (ref 20–29)
Calcium: 9.2 mg/dL (ref 8.6–10.2)
Chloride: 102 mmol/L (ref 96–106)
Creatinine, Ser: 0.78 mg/dL (ref 0.76–1.27)
GFR calc Af Amer: 110 mL/min/{1.73_m2} (ref >59)
GFR calc non Af Amer: 95 mL/min/{1.73_m2} (ref >59)
Glucose: 203 mg/dL — ABNORMAL HIGH (ref 65–99)
Potassium: 4.1 mmol/L (ref 3.5–5.2)
Sodium: 140 mmol/L (ref 134–144)

## 2017-08-05 ENCOUNTER — Other Ambulatory Visit: Payer: Self-pay | Admitting: Urgent Care

## 2017-08-05 LAB — URINE CULTURE

## 2017-08-05 MED ORDER — AMOXICILLIN 875 MG PO TABS: 875.0000 mg | ORAL_TABLET | Freq: Two times a day (BID) | ORAL | 0 refills | Status: DC

## 2017-08-05 NOTE — Progress Notes (Signed)
Urine culture was positive for strep infection. Will have patient take amoxicillin twice daily.

## 2017-08-06 ENCOUNTER — Telehealth: Payer: Self-pay | Admitting: Family Medicine

## 2017-08-06 ENCOUNTER — Encounter: Payer: Self-pay | Admitting: Urgent Care

## 2017-08-06 NOTE — Telephone Encounter (Signed)
Copied from [place] [phone]. Topic: Quick Communication - See Telephone Encounter >> Aug 06, 2017  8:04 AM Ether Griffins B wrote: CRM for notification. See Telephone encounter for:  Pt received a call but unsure what its about. Pt wondering if its about the lab work. Pt did see on my chart that a medication had been called in for kidney infection and has a question about the medication that was called in for him. If pt doesn't answer please call work number [phone] ext [phone] 08/06/17.

## 2017-08-12 NOTE — Telephone Encounter (Signed)
Message sent through [place] by PA [facility].

## 2017-08-13 ENCOUNTER — Telehealth: Payer: Self-pay | Admitting: Urgent Care

## 2017-08-13 MED ORDER — CEPHALEXIN 500 MG PO CAPS: 500.0000 mg | ORAL_CAPSULE | Freq: Three times a day (TID) | ORAL | 0 refills | Status: DC

## 2017-08-13 NOTE — Telephone Encounter (Unsigned)
Copied from [place]. Topic: Inquiry >> Aug 13, 2017  2:30 PM Neva Seat wrote: Dr. Bess Harvest said for pt to let him know if his side is still hurting after taking the Amoxicillin - has 3 left on hand.  Pt wants to know if he needs to come back in or if Dr. Bess Harvest can prescribe some other medication.

## 2017-08-13 NOTE — Telephone Encounter (Signed)
When calling patient back please call [phone] ext [phone], if no one answers hit [phone] and have him paged

## 2017-08-13 NOTE — Addendum Note (Signed)
Addended by: Jaynee Eagles on: 08/13/2017 03:45 PM   Modules accepted: Orders

## 2017-08-18 ENCOUNTER — Ambulatory Visit: Payer: 59 | Admitting: Urgent Care

## 2017-08-18 ENCOUNTER — Encounter: Payer: Self-pay | Admitting: Urgent Care

## 2017-08-18 VITALS — BP 124/80 | HR 81 | Temp 98.5°F | Resp 18 | Ht 66.0 in | Wt 206.0 lb

## 2017-08-18 DIAGNOSIS — M545 Low back pain, unspecified: Secondary | ICD-10-CM

## 2017-08-18 DIAGNOSIS — N3090 Cystitis, unspecified without hematuria: Secondary | ICD-10-CM | POA: Diagnosis not present

## 2017-08-18 DIAGNOSIS — R109 Unspecified abdominal pain: Secondary | ICD-10-CM | POA: Diagnosis not present

## 2017-08-18 LAB — POCT URINALYSIS DIP (MANUAL ENTRY)
Bilirubin, UA: NEGATIVE
Blood, UA: NEGATIVE
Glucose, UA: NEGATIVE mg/dL
Ketones, POC UA: NEGATIVE mg/dL
Leukocytes, UA: NEGATIVE
Nitrite, UA: NEGATIVE
Protein Ur, POC: NEGATIVE mg/dL
Spec Grav, UA: <=1.005 — AB (ref 1.010–1.025)
Urobilinogen, UA: 0.2 E.U./dL
pH, UA: 6 (ref 5.0–8.0)

## 2017-08-18 LAB — POCT GLYCOSYLATED HEMOGLOBIN (HGB A1C): Hemoglobin A1C: 7.3

## 2017-08-18 NOTE — Progress Notes (Signed)
   MRN: [id number] DOB: 18-Oct-1950  Subjective:   Aaron [name] is a 66 y.o. male presenting for follow up on cystitis. Last OV was 08/03/2017, was found to have cystitis from beta hemolytic Strep, Group B. He has undergone course of amoxicillin, Keflex. Today, reports no improvement in his symptoms. Has ongoing right flank pain. Denies fever, n/v, abdominal pain, dysuria, hematuria, urinary frequency, constipation.  Aaron [name] currently has no medications in their medication list. Also is allergic to erythromycin.  Aaron [name]  has a past medical history of Diabetes mellitus without complication ([place]) and GERD (gastroesophageal reflux disease). Also  has no past surgical history on file.  Objective:   Vitals: BP 124/80   Pulse 81   Temp 98.5 F (36.9 C) (Oral)   Resp 18   Ht 5\' 6"  (1.676 m)   Wt 206 lb (93.4 kg)   SpO2 98%   BMI 33.25 kg/m   Physical Exam  Constitutional: He is oriented to person, place, and time. He appears well-developed and well-nourished.  Cardiovascular: Normal rate, regular rhythm and intact distal pulses. Exam reveals no gallop and no friction rub.  No murmur heard. Pulmonary/Chest: Effort normal. No respiratory distress. He has no wheezes. He has no rales.  Abdominal: Soft. Bowel sounds are normal. He exhibits no distension and no mass. There is no tenderness. There is no guarding.  No CVA tenderness.  Musculoskeletal:       Lumbar back: He exhibits tenderness (over area depicted). He exhibits normal range of motion, no bony tenderness, no swelling, no edema, no deformity and no spasm.       Back:  Neurological: He is alert and oriented to person, place, and time.  Skin: Skin is warm and dry.   Results for orders placed or performed in visit on 08/18/17 (from the past 24 hour(s))  POCT urinalysis dipstick     Status: Abnormal   Collection Time: 08/18/17  5:35 PM  Result Value Ref Range   Color, UA yellow yellow   Clarity, UA clear clear   Glucose, UA  negative negative mg/dL   Bilirubin, UA negative negative   Ketones, POC UA negative negative mg/dL   Spec Grav, UA <=1.005 (A) 1.010 - 1.025   Blood, UA negative negative   pH, UA 6.0 5.0 - 8.0   Protein Ur, POC negative negative mg/dL   Urobilinogen, UA 0.2 0.2 or 1.0 E.U./dL   Nitrite, UA Negative Negative   Leukocytes, UA Negative Negative   Assessment and Plan :   Flank pain - Plan: POCT urinalysis dipstick, Urine Culture, Hepatic Function Panel, POCT glycosylated hemoglobin (Hb A1C), CT RENAL STONE STUDY, CANCELED: CT RENAL STONE STUDY  Acute right-sided low back pain without sciatica - Plan: CT RENAL STONE STUDY  Cystitis - Plan: CT RENAL STONE STUDY   Urine culture pending, will pursue CT renal study. Patient does not have abdominal pain and therefore CT abdomen not appropriate. Lumbar x-ray from 05/31/2017 showed bilateral facet arthropathy at L5-S1 and therefore will try to manage this as musculoskeletal type pain until the CT renal study is completed. If pain persists, will consider referral to ortho.  Jaynee Eagles, PA-C [facility] [phone] 08/18/2017 5:28 PM

## 2017-08-18 NOTE — Patient Instructions (Addendum)
We will try pursue a CT scan to rule out kidney stones as a source of your flank pain. Our staff will try to schedule this and be in touch with you.   Flank Pain, Adult Flank pain is pain that is located on the side of the body between the upper abdomen and the back. This area is called the flank. The pain may occur over a short period of time (acute), or it may be long-term or recurring (chronic). It may be mild or severe. Flank pain can be caused by many things, including:  Muscle soreness or injury.  Kidney stones or kidney disease.  Stress.  A disease of the spine (vertebral disk disease).  A lung infection (pneumonia).  Fluid around the lungs (pulmonary edema).  A skin rash caused by the chickenpox virus (shingles).  Tumors that affect the back of the abdomen.  Gallbladder disease.  Follow these instructions at home:  Drink enough fluid to keep your urine clear or pale yellow.  Rest as told by your health care provider.  Take over-the-counter and prescription medicines only as told by your health care provider.  Keep a journal to track what has caused your flank pain and what has made it feel better.  Keep all follow-up visits as told by your health care provider. This is important. Contact a health care provider if:  Your pain is not controlled with medicine.  You have new symptoms.  Your pain gets worse.  You have a fever.  Your symptoms last longer than 2-3 days.  You have trouble urinating or you are urinating very frequently. Get help right away if:  You have trouble breathing or you are short of breath.  Your abdomen hurts or it is swollen or red.  You have nausea or vomiting.  You feel faint or you pass out.  You have blood in your urine. Summary  Flank pain is pain that is located on the side of the body between the upper abdomen and the back.  The pain may occur over a short period of time (acute), or it may be long-term or recurring  (chronic). It may be mild or severe.  Flank pain can be caused by many things.  Contact your health care provider if your symptoms get worse or they last longer than 2-3 days. This information is not intended to replace advice given to you by your health care provider. Make sure you discuss any questions you have with your health care provider. Document Released: 10/08/2005 Document Revised: 10/30/2016 Document Reviewed: 10/30/2016 Elsevier Interactive Patient Education  2018 [employer].     Back Pain, Adult Many adults have back pain from time to time. Common causes of back pain include:  A strained muscle or ligament.  Wear and tear (degeneration) of the spinal disks.  Arthritis.  A hit to the back.  Back pain can be short-lived (acute) or last a long time (chronic). A physical exam, lab tests, and imaging studies may be done to find the cause of your pain. Follow these instructions at home: Managing pain and stiffness  Take over-the-counter and prescription medicines only as told by your health care provider.  If directed, apply heat to the affected area as often as told by your health care provider. Use the heat source that your health care provider recommends, such as a moist heat pack or a heating pad. ? Place a towel between your skin and the heat source. ? Leave the heat on for 20-30  minutes. ? Remove the heat if your skin turns bright red. This is especially important if you are unable to feel pain, heat, or cold. You have a greater risk of getting burned.  If directed, apply ice to the injured area: ? Put ice in a plastic bag. ? Place a towel between your skin and the bag. ? Leave the ice on for 20 minutes, 2-3 times a day for the first 2-3 days. Activity  Do not stay in bed. Resting more than 1-2 days can delay your recovery.  Take short walks on even surfaces as soon as you are able. Try to increase the length of time you walk each day.  Do not sit, drive,  or stand in one place for more than 30 minutes at a time. Sitting or standing for long periods of time can put stress on your back.  Use proper lifting techniques. When you bend and lift, use positions that put less stress on your back: ? [place] your knees. ? Keep the load close to your body. ? Avoid twisting.  Exercise regularly as told by your health care provider. Exercising will help your back heal faster. This also helps prevent back injuries by keeping muscles strong and flexible.  Your health care provider may recommend that you see a physical therapist. This person can help you come up with a safe exercise program. Do any exercises as told by your physical therapist. Lifestyle  Maintain a healthy weight. Extra weight puts stress on your back and makes it difficult to have good posture.  Avoid activities or situations that make you feel anxious or stressed. Learn ways to manage anxiety and stress. One way to manage stress is through exercise. Stress and anxiety increase muscle tension and can make back pain worse. General instructions  Sleep on a firm mattress in a comfortable position. Try lying on your side with your knees slightly bent. If you lie on your back, put a pillow under your knees.  Follow your treatment plan as told by your health care provider. This may include: ? Cognitive or behavioral therapy. ? Acupuncture or massage therapy. ? Meditation or yoga. Contact a health care provider if:  You have pain that is not relieved with rest or medicine.  You have increasing pain going down into your legs or buttocks.  Your pain does not improve in 2 weeks.  You have pain at night.  You lose weight.  You have a fever or chills. Get help right away if:  You develop new bowel or bladder control problems.  You have unusual weakness or numbness in your arms or legs.  You develop nausea or vomiting.  You develop abdominal pain.  You feel faint. Summary  Many  adults have back pain from time to time. A physical exam, lab tests, and imaging studies may be done to find the cause of your pain.  Use proper lifting techniques. When you bend and lift, use positions that put less stress on your back.  Take over-the-counter and prescription medicines and apply heat or ice as directed by your health care provider. This information is not intended to replace advice given to you by your health care provider. Make sure you discuss any questions you have with your health care provider. Document Released: 08/17/2005 Document Revised: 09/21/2016 Document Reviewed: 09/21/2016 Elsevier Interactive Patient Education  2018 [employer].     IF you received an x-ray today, you will receive an invoice from [facility]. Please contact [facility]  Radiology at [phone] with questions or concerns regarding your invoice.   IF you received labwork today, you will receive an invoice from [facility]. Please contact [facility] at [phone] with questions or concerns regarding your invoice.   Our billing staff will not be able to assist you with questions regarding bills from these companies.  You will be contacted with the lab results as soon as they are available. The fastest way to get your results is to activate your My Chart account. Instructions are located on the last page of this paperwork. If you have not heard from [place] regarding the results in 2 weeks, please contact this office.

## 2017-08-19 ENCOUNTER — Encounter: Payer: Self-pay | Admitting: Urgent Care

## 2017-08-19 LAB — HEPATIC FUNCTION PANEL
ALT: 27 IU/L (ref 0–44)
AST: 19 IU/L (ref 0–40)
Albumin: 4.3 g/dL (ref 3.6–4.8)
Alkaline Phosphatase: 95 IU/L (ref 39–117)
Bilirubin Total: 0.4 mg/dL (ref 0.0–1.2)
Bilirubin, Direct: 0.12 mg/dL (ref 0.00–0.40)
Total Protein: 7 g/dL (ref 6.0–8.5)

## 2017-08-20 LAB — URINE CULTURE: Organism ID, Bacteria: NO GROWTH

## 2017-09-07 ENCOUNTER — Ambulatory Visit: Payer: 59 | Admitting: Family Medicine

## 2017-09-30 ENCOUNTER — Encounter: Payer: Self-pay | Admitting: Urgent Care

## 2017-10-24 ENCOUNTER — Other Ambulatory Visit: Payer: Self-pay | Admitting: Family Medicine

## 2017-12-31 ENCOUNTER — Other Ambulatory Visit: Payer: Self-pay | Admitting: Family Medicine

## 2018-01-03 DIAGNOSIS — N390 Urinary tract infection, site not specified: Secondary | ICD-10-CM | POA: Diagnosis not present

## 2018-01-03 DIAGNOSIS — R300 Dysuria: Secondary | ICD-10-CM | POA: Diagnosis not present

## 2018-01-03 NOTE — Telephone Encounter (Signed)
Metformin refill request 500 mg 24 hr tab.  LOV 05/31/17 with Jaynee Eagles     Needs appt.  [facility] [place], [place].

## 2018-01-25 ENCOUNTER — Encounter: Payer: Self-pay | Admitting: Family Medicine

## 2018-02-22 ENCOUNTER — Ambulatory Visit (INDEPENDENT_AMBULATORY_CARE_PROVIDER_SITE_OTHER): Payer: Medicare Other | Admitting: Physician Assistant

## 2018-02-22 ENCOUNTER — Other Ambulatory Visit: Payer: Self-pay

## 2018-02-22 ENCOUNTER — Encounter: Payer: Self-pay | Admitting: Physician Assistant

## 2018-02-22 ENCOUNTER — Ambulatory Visit (INDEPENDENT_AMBULATORY_CARE_PROVIDER_SITE_OTHER): Payer: Medicare Other

## 2018-02-22 ENCOUNTER — Telehealth: Payer: Self-pay | Admitting: Internal Medicine

## 2018-02-22 ENCOUNTER — Ambulatory Visit (HOSPITAL_COMMUNITY)
Admission: RE | Admit: 2018-02-22 | Discharge: 2018-02-22 | Disposition: A | Discharge status: Home / Self Care | Payer: Medicare Other | Source: Ambulatory Visit | Attending: Physician Assistant | Admitting: Physician Assistant

## 2018-02-22 ENCOUNTER — Telehealth: Payer: Self-pay | Admitting: *Deleted

## 2018-02-22 VITALS — BP 130/72 | HR 79 | Temp 98.9°F | Resp 16 | Ht 66.54 in | Wt 202.0 lb

## 2018-02-22 DIAGNOSIS — R062 Wheezing: Secondary | ICD-10-CM

## 2018-02-22 DIAGNOSIS — R0789 Other chest pain: Secondary | ICD-10-CM | POA: Diagnosis not present

## 2018-02-22 DIAGNOSIS — T17320A Food in larynx causing asphyxiation, initial encounter: Secondary | ICD-10-CM

## 2018-02-22 DIAGNOSIS — R079 Chest pain, unspecified: Secondary | ICD-10-CM | POA: Insufficient documentation

## 2018-02-22 DIAGNOSIS — I2510 Atherosclerotic heart disease of native coronary artery without angina pectoris: Secondary | ICD-10-CM | POA: Diagnosis not present

## 2018-02-22 DIAGNOSIS — E119 Type 2 diabetes mellitus without complications: Secondary | ICD-10-CM

## 2018-02-22 MED ORDER — METFORMIN HCL ER 500 MG PO TB24: 500.0000 mg | ORAL_TABLET | Freq: Every day | ORAL | 0 refills | Status: DC

## 2018-02-22 MED ORDER — LEVOFLOXACIN 500 MG PO TABS
500.0000 mg | ORAL_TABLET | Freq: Every day | ORAL | 0 refills | Status: DC
Start: 2018-02-22 — End: 2018-03-01

## 2018-02-22 NOTE — Telephone Encounter (Signed)
Spoke to patient he was advised that he was to go to [facility] not [facility].

## 2018-02-22 NOTE — Progress Notes (Signed)
MRN: [id number] DOB: July 17, 1951  Subjective:   Aaron [name] is a 67 y.o. male presenting for chief complaint of Wheezing (pt states she had some food cought in his throat 2 days ago and now her has a wheezing sound when he takes deep breathes ) .  Reports 2 days ago was eating corn on the cob. Felt a piece "go down the wrong way." Had a very large coughing episode and a corn kernal was dislodged from his throat. Since that episode, he has had wheezing and right sided chest discomfort with deep breathing. Denies shortness of breath, dyspnea on exertion, chest pain at rest, fever, chills, nausea, vomiting, difficulty swallowing, voice change.  He has been able to eat solid food and swallow liquids without any issues.  No asthma or COPD past medical history of asthma or COPD.  Denies smoking.  Has PMH of diabetes, takes metformin.  Has been on metformin for the past 2 weeks.  Was just given 15-day refill as he has a follow-up appointment for complete physical exam with his PCP on 03/01/2018.  Denies any other aggravating or relieving factors, no other questions or concerns.    [name] has a current medication list which includes the following prescription(s): metformin. Also is allergic to erythromycin.  [name]  has a past medical history of Diabetes mellitus without complication ([place]) and GERD (gastroesophageal reflux disease). Also  has no past surgical history on file.   Objective:   Vitals: BP 130/72   Pulse 79   Temp 98.9 F (37.2 C) (Oral)   Resp 16   Ht 5' 6.54" (1.69 m)   Wt 202 lb (91.6 kg)   SpO2 96%   BMI 32.08 kg/m   Physical Exam  Constitutional: He is oriented to person, place, and time. He appears well-developed and well-nourished. No distress.  HENT:  Head: Normocephalic and atraumatic.  Eyes: Conjunctivae are normal.  Neck: Normal range of motion.  Cardiovascular: Normal rate, regular rhythm, normal heart sounds and intact distal pulses.  Pulmonary/Chest:  Effort normal. No accessory muscle usage or stridor. No tachypnea. No respiratory distress. He has no decreased breath sounds. He has wheezes (mild faint wheezing noted, can hear them with and without stethoscope, cleared with cough ) in the right middle field. He has no rhonchi. He has no rales.  Neurological: He is alert and oriented to person, place, and time.  Skin: Skin is warm and dry.  Psychiatric: He has a normal mood and affect.  Vitals reviewed.   No results found for this or any previous visit (from the past 24 hour(s)).  Dg Chest 2 View  Result Date: 02/22/2018 CLINICAL DATA:  Right-sided chest pain following choking incident EXAM: CHEST - 2 VIEW COMPARISON:  02/07/2015 FINDINGS: Cardiac shadow is within normal limits. The lungs are well aerated bilaterally. No focal infiltrate or sizable effusion is seen. No collapse is noted. No bony abnormality is seen. IMPRESSION: No acute abnormality noted. Electronically Signed   By: Inez Catalina M.D.   On: 02/22/2018 14:08    Assessment and Plan :  1. Choking due to food in larynx, initial encounter Patient is overall well-appearing, no acute distress.  Vitals stable.  [facility].  Discussed case with nurse Daneil Dan.  She discussed case with Dr. Chase Caller.  Recommended stat CT chest without contrast and prophylactic Levaquin 500 mg x 5 days.  Will contact patient with CT results and discuss further treatment plan.  Given strict ED precautions. -  DG Chest 2 View; Future - CT Chest Wo Contrast; Future - levofloxacin (LEVAQUIN) 500 MG tablet; Take 1 tablet (500 mg total) by mouth daily.  Dispense: 5 tablet; Refill: 0  2. Wheezing - DG Chest 2 View; Future  Side effects, risks, benefits, and alternatives of the medications and treatment plan prescribed today were discussed, and patient expressed understanding of the instructions given. No barriers to understanding were identified. Red flags discussed in detail. Pt expressed  understanding regarding what to do in case of emergency/urgent symptoms.  Tenna Delaine, PA-C  [facility] 02/22/2018 2:11 PM

## 2018-02-22 NOTE — Patient Instructions (Addendum)
I have contacted pulmonology and should hear back from them today and will let you know the further treatment plan.  If any of your symptoms worsen you develop new shortness of breath, chest pain at rest, fever, chills, or difficulty swallowing please seek care immediately.    IF you received an x-ray today, you will receive an invoice from [facility]. Please contact [facility] Radiology at [phone] with questions or concerns regarding your invoice.   IF you received labwork today, you will receive an invoice from [facility]. Please contact [facility] at [phone] with questions or concerns regarding your invoice.   Our billing staff will not be able to assist you with questions regarding bills from these companies.  You will be contacted with the lab results as soon as they are available. The fastest way to get your results is to activate your My Chart account. Instructions are located on the last page of this paperwork. If you have not heard from [place] regarding the results in 2 weeks, please contact this office.     Aspiration Precautions, Adult Aspiration is the breathing in (inhalation) of a liquid or object into the lungs. Things that can be inhaled into the lungs include:  Food.  Any type of liquid, such as drinks or saliva.  Stomach contents, such as vomit or stomach acid.  What are the signs of aspiration? Signs of aspiration include:  Coughing after swallowing food or liquids.  Clearing the throat often while eating.  Trouble breathing. This may include: ? Breathing quickly. ? Breathing very slowly. ? Loud breathing. ? Rumbling sounds from the lungs while breathing.  Coughing up phlegm (sputum) that: ? Is yellow, tan, or green. ? Has pieces of food in it. ? Is bad-smelling.  Having a hoarse, barky cough.  Not being able to speak.  A hoarse voice.  Drooling while eating.  A feeling of fullness in the throat or a feeling that something is stuck in  the throat.  Choking often.  Having a runny noise while eating.  Coughing when lying down or having to sit up quickly after lying down.  A change in skin color. The skin may look red or blue.  Fever.  Watery eyes.  Pain in the chest or back.  A pained look on the face.  What are the complications of aspiration? Complications of aspiration include:  Losing weight because the person is not absorbing needed nutrients.  Loss of enjoyment and the social benefits of eating.  Choking.  Lung irritation, if someone aspirates acidic food or drinks.  Lung infection (pneumonia).  Collection of infected liquid (pus) in the lungs (lung abscess).  In serious cases, death can occur. What can I do to prevent aspiration? Caring for someone who has a feeding tube If you are caring for someone who has a feeding tube who cannot eat or drink safely through his or her mouth:  Keep the person in an upright position as much as possible.  Do not lay the person flat if he or she is getting continuous feedings. If you need to lay the person flat for any reason, turn the feeding pump off.  Check feeding tube residuals as told by your health care provider. Ask your health care provider what residual amount is too high.  Caring for someone who can eat and drink safely by mouth If you are caring for someone who can eat and drink safely through his or her mouth:  Have the person sit in an upright  position when eating food or drinking fluids. This can be done in two ways: ? Have the person sit up in a chair. ? If sitting in a chair is not possible, position the person in bed so he or she is upright.  Remind the person to eat slowly and chew well. Make sure the person is awake and alert while eating.  Do not distract the person. This is especially important for people with thinking or memory (cognitive) problems.  Allow foods to cool. Hot foods may be more difficult to swallow.  Provide small  meals more frequently, instead of 3 large meals. This may reduce fatigue during eating.  Check the person's mouth thoroughly for leftover food after eating.  Keep the person sitting upright for 30-45 minutes after eating.  Do not serve food or drink during 2 hours or more before bedtime.  General instructions Follow these general guidelines to prevent aspiration in someone who can eat and drink safely by mouth:  Never put food or liquids in the mouth of a person who is not fully alert.  Feed small amounts of food. Do not force feed.  For a person who is on a diet for swallowing difficulty (dysphagia diet), follow the recommended food and drink consistency. For example, in dysphagia diet level 1, thicken liquids to pudding-like consistency.  Use as little water as possible when brushing the person's teeth or cleaning his or her mouth.  Provide oral care before and after meals.  Use adaptive devices such as cut-out cups, straws, or utensils as told by the health care provider.  Crush pills and put them in soft food such as pudding or ice cream. Some pills should not be crushed. Check with the health care provider before crushing any medicine.  Contact a health care provider if:  The person has a feeding tube, and the feeding tube residual amount is too high.  The person has a fever.  The person tries to avoid food or water, such as refusing to eat, drink, or be fed, or is eating less than normal.  The person may have aspirated food or liquid.  You notice warning signs, such as choking or coughing, when the person eats or drinks. Get help right away if:  The person has trouble breathing or starts to breathe quickly.  The person is breathing very slowly or stops breathing.  The person coughs a lot after eating or drinking.  The person has a long-lasting (chronic) cough.  The person coughs up thick, yellow, or tan sputum.  If someone is choking on food or an object, perform  the Heimlich maneuver (abdominal thrusts).  The person has symptoms of pneumonia, such as: ? Coughing a lot. ? Coughing up mucus with a bad smell or blood in it. ? Feeling short of breath. ? Complaining of chest pain. ? Sweating, fever, and chills. ? Feeling tired. ? Complaining of trouble breathing. ? Wheezing.  The person cannot stop choking.  The person is unable to breathe, turns blue, faints, or seems confused. These symptoms may represent a serious problem that is an emergency. Do not wait to see if the symptoms will go away. Get medical help right away. Call your local emergency services (911 in the [place]  Summary  Aspiration is the breathing in (inhalation) of a liquid or object into the lungs. Things that can be inhaled into the lungs include food, liquids, saliva, or stomach contents.  Aspiration can cause pneumonia or choking.  One sign of  aspiration is coughing after swallowing food or liquids.  Contact a health care provider if you notice signs of aspiration. This information is not intended to replace advice given to you by your health care provider. Make sure you discuss any questions you have with your health care provider. Document Released: 09/19/2010 Document Revised: 05/14/2016 Document Reviewed: 05/14/2016 Elsevier Interactive Patient Education  [employer].

## 2018-02-23 ENCOUNTER — Encounter: Payer: Self-pay | Admitting: Physician Assistant

## 2018-02-23 NOTE — Telephone Encounter (Signed)
Copied from [place] [id number]. Topic: Quick Communication - See Telephone Encounter >> Feb 23, 2018  2:55 PM Antonieta Iba C wrote: CRM for notification. See Telephone encounter for: 02/23/18.  Pt called in to request a call back from provider, pt would not give me any detail just stated that he need a call back from provider.   CB:   [phone] ext [phone]

## 2018-02-23 NOTE — Telephone Encounter (Signed)
Late entry from 6.25.2019: Aaron [name], [place], called from PCP requesting recs regarding pt's aspiration. She states the pt aspirated corn several days ago and while in PCP office seeing [place] the pt coughed up a kernel of corn. [place] requesting recs from DOD of what needs to be done. [place] states the pt's CXR came back clear.   Spoke with MR (DOD). He advised [place] to order CT Chest w/o contrast and Levaquin 500mg  QD x 5 days (with regard to allergies, tendon issues, cardiac issues) and if any s/s worsen to seek emergency care. Called [place] back and informed her of this. She verbalized understanding and denied any further questions or concerns at this time.

## 2018-02-23 NOTE — Telephone Encounter (Signed)
Copied from [place] [id number]. Topic: Inquiry >> Feb 23, 2018  8:03 AM Pricilla Handler wrote: Reason for CRM: Patient called requesting to speak with Tenna Delaine. Patient states that he was sent for a CT Scan on yesterday, and had an episode last night. Patient would like [place] to call him back today at [phone] or [phone], [phone].       Thank You!!!

## 2018-02-24 ENCOUNTER — Telehealth: Payer: Self-pay | Admitting: Physician Assistant

## 2018-02-24 NOTE — Telephone Encounter (Signed)
Message sent to [facility]

## 2018-02-24 NOTE — Telephone Encounter (Signed)
Copied from [place] [id number]. Topic: Quick Communication - See Telephone Encounter >> Feb 24, 2018  9:10 AM Synthia Innocent wrote: Patient calling again, requesting call back

## 2018-02-25 ENCOUNTER — Other Ambulatory Visit: Payer: Self-pay

## 2018-02-25 ENCOUNTER — Ambulatory Visit (INDEPENDENT_AMBULATORY_CARE_PROVIDER_SITE_OTHER): Payer: Medicare Other | Admitting: Physician Assistant

## 2018-02-25 ENCOUNTER — Encounter: Payer: Self-pay | Admitting: Physician Assistant

## 2018-02-25 VITALS — BP 137/89 | HR 88 | Temp 98.9°F | Resp 18 | Ht 66.54 in | Wt 203.6 lb

## 2018-02-25 DIAGNOSIS — T17908D Unspecified foreign body in respiratory tract, part unspecified causing other injury, subsequent encounter: Secondary | ICD-10-CM | POA: Diagnosis not present

## 2018-02-25 NOTE — Progress Notes (Signed)
Aaron [name]  MRN: [id number] DOB: 08/27/1951  Subjective:  Aaron [name] is a 67 y.o. male seen in office today for a chief complaint of follow-up on potential aspiration.  Patient last seen in office on  02/18/2018 with wheezing and chest pain with deep inspiration after having corn kernels go down the wrong way  2 days prior.  Faint wheezes noted on exam.  Plain films negative.  Pulmonology was consulted.  They recommended CT of the chest and prophylactic Levaquin for 5 days.  CT of chest was negative.  Patient encouraged to follow-up in 2 days for reevaluation.  Today, patient reports that he was lying in bed the day after he saw me in office and had a coughing episode and coughed up another corn kernel.  Since then, his wheezing has completely resolved.  Did have some mild chest discomfort after the episode, but this is also resolved.  Denies productive cough, shortness of breath, fever, fatigue, or chest tightness.  Stop taking the Levaquin after 3 doses due to flashing lights in his left eye while looking at screens while on the medication.  As soon as he stopped the medication, he has not had any visual issues.  Denies decreased vision, blurred vision, double vision, eye pain, and visual floaters.   Review of Systems  Per HPI   Patient Active Problem List   Diagnosis Date Noted  . Gastroesophageal reflux disease without esophagitis 03/13/2017  . Type 2 diabetes mellitus without complication, without long-term current use of insulin ([place]) 03/13/2017  . Functional diarrhea 03/13/2017  . Type II or unspecified type diabetes mellitus without mention of complication, not stated as uncontrolled 04/06/2014    Current Outpatient Medications on File Prior to Visit  Medication Sig Dispense Refill  . metFORMIN (GLUCOPHAGE-XR) 500 MG 24 hr tablet Take 1 tablet (500 mg total) by mouth daily with breakfast. Office visit needed 15 tablet 0  . levofloxacin (LEVAQUIN) 500 MG tablet Take 1 tablet  (500 mg total) by mouth daily. (Patient not taking: Reported on 02/25/2018) 5 tablet 0   No current facility-administered medications on file prior to visit.     Allergies  Allergen Reactions  . Erythromycin Itching     Objective:  BP 137/89 (BP Location: Right Arm, Patient Position: Sitting, Cuff Size: Large)   Pulse 88   Temp 98.9 F (37.2 C) (Oral)   Resp 18   Ht 5' 6.54" (1.69 m)   Wt 203 lb 9.6 oz (92.4 kg)   SpO2 95%   BMI 32.34 kg/m    Physical Exam  Constitutional: He is oriented to person, place, and time. He appears well-developed and well-nourished. No distress.  HENT:  Head: Normocephalic and atraumatic.  Eyes: Conjunctivae are normal.  Neck: Normal range of motion.  Cardiovascular: Normal rate, regular rhythm and normal heart sounds.  Pulmonary/Chest: Effort normal and breath sounds normal. No accessory muscle usage or stridor. No respiratory distress. He has no decreased breath sounds. He has no wheezes. He has no rhonchi. He has no rales.  Neurological: He is alert and oriented to person, place, and time.  Skin: Skin is warm and dry.  Psychiatric: He has a normal mood and affect.  Vitals reviewed.   Assessment and Plan :  1. Aspiration into airway, subsequent encounter Since the second corn kernel was expelled from airway, patient has been asymptomatic.  Patient is overall well-appearing, no distress.  Vitals stable.  Lung exam CTAB. Due to adverse reaction to Levaquin,  recommend discontinuing at this time. Suspect he has had adequate dose for prophylactic coverage. He has follow-up with PCP in 4 days.  Given strict return precautions in the meantime.  Patient voices understanding.   Tenna Delaine PA-C  [facility] 02/27/2018 9:42 AM

## 2018-02-25 NOTE — Patient Instructions (Addendum)
Stop levaquin at this time. Follow up with Dr. Tamala Julian as planned. If you develop any productive cough, fever, chills, fatigue, shortness of breath, or chest pain, contact us immediately. If your wheezing returns, let me know. Thank you for letting me participate in your health and well being.  IF you received an x-ray today, you will receive an invoice from [facility]. Please contact [facility] at [phone] with questions or concerns regarding your invoice.   IF you received labwork today, you will receive an invoice from [place]. Please contact [facility] at [phone] with questions or concerns regarding your invoice.   Our billing staff will not be able to assist you with questions regarding bills from these companies.  You will be contacted with the lab results as soon as they are available. The fastest way to get your results is to activate your My Chart account. Instructions are located on the last page of this paperwork. If you have not heard from [place] regarding the results in 2 weeks, please contact this office.

## 2018-02-27 ENCOUNTER — Encounter: Payer: Self-pay | Admitting: Physician Assistant

## 2018-03-01 ENCOUNTER — Encounter: Payer: Self-pay | Admitting: Urgent Care

## 2018-03-01 ENCOUNTER — Ambulatory Visit (INDEPENDENT_AMBULATORY_CARE_PROVIDER_SITE_OTHER): Payer: Medicare Other | Admitting: Urgent Care

## 2018-03-01 ENCOUNTER — Other Ambulatory Visit: Payer: Self-pay | Admitting: *Deleted

## 2018-03-01 VITALS — BP 138/82 | HR 81 | Temp 98.3°F | Resp 16 | Ht 66.0 in | Wt 204.0 lb

## 2018-03-01 DIAGNOSIS — R350 Frequency of micturition: Secondary | ICD-10-CM | POA: Diagnosis not present

## 2018-03-01 DIAGNOSIS — Z130 Encounter for screening for diseases of the blood and blood-forming organs and certain disorders involving the immune mechanism: Secondary | ICD-10-CM | POA: Diagnosis not present

## 2018-03-01 DIAGNOSIS — M545 Low back pain: Secondary | ICD-10-CM | POA: Diagnosis not present

## 2018-03-01 DIAGNOSIS — Z6832 Body mass index (BMI) 32.0-32.9, adult: Secondary | ICD-10-CM

## 2018-03-01 DIAGNOSIS — R202 Paresthesia of skin: Secondary | ICD-10-CM

## 2018-03-01 DIAGNOSIS — R030 Elevated blood-pressure reading, without diagnosis of hypertension: Secondary | ICD-10-CM | POA: Diagnosis not present

## 2018-03-01 DIAGNOSIS — E119 Type 2 diabetes mellitus without complications: Secondary | ICD-10-CM | POA: Diagnosis not present

## 2018-03-01 DIAGNOSIS — I10 Essential (primary) hypertension: Secondary | ICD-10-CM | POA: Diagnosis not present

## 2018-03-01 DIAGNOSIS — R200 Anesthesia of skin: Secondary | ICD-10-CM

## 2018-03-01 DIAGNOSIS — E669 Obesity, unspecified: Secondary | ICD-10-CM

## 2018-03-01 DIAGNOSIS — R1900 Intra-abdominal and pelvic swelling, mass and lump, unspecified site: Secondary | ICD-10-CM

## 2018-03-01 DIAGNOSIS — Z0000 Encounter for general adult medical examination without abnormal findings: Secondary | ICD-10-CM

## 2018-03-01 DIAGNOSIS — G8929 Other chronic pain: Secondary | ICD-10-CM

## 2018-03-01 DIAGNOSIS — M542 Cervicalgia: Secondary | ICD-10-CM

## 2018-03-01 MED ORDER — ATORVASTATIN CALCIUM 40 MG PO TABS: 40.0000 mg | ORAL_TABLET | Freq: Every day | ORAL | 3 refills | Status: DC

## 2018-03-01 MED ORDER — METFORMIN HCL ER 500 MG PO TB24: 500.0000 mg | ORAL_TABLET | Freq: Every day | ORAL | 1 refills | Status: DC

## 2018-03-01 MED ORDER — ENALAPRIL MALEATE 10 MG PO TABS: 10.0000 mg | ORAL_TABLET | Freq: Every day | ORAL | 0 refills | Status: DC

## 2018-03-01 NOTE — Patient Instructions (Addendum)
Diabetes Mellitus and Nutrition When you have diabetes (diabetes mellitus), it is very important to have healthy eating habits because your blood sugar (glucose) levels are greatly affected by what you eat and drink. Eating healthy foods in the appropriate amounts, at about the same times every day, can help you:  Control your blood glucose.  Lower your risk of heart disease.  Improve your blood pressure.  Reach or maintain a healthy weight.  Every person with diabetes is different, and each person has different needs for a meal plan. Your health care provider may recommend that you work with a diet and nutrition specialist (dietitian) to make a meal plan that is best for you. Your meal plan may vary depending on factors such as:  The calories you need.  The medicines you take.  Your weight.  Your blood glucose, blood pressure, and cholesterol levels.  Your activity level.  Other health conditions you have, such as heart or kidney disease.  How do carbohydrates affect me? Carbohydrates affect your blood glucose level more than any other type of food. Eating carbohydrates naturally increases the amount of glucose in your blood. Carbohydrate counting is a method for keeping track of how many carbohydrates you eat. Counting carbohydrates is important to keep your blood glucose at a healthy level, especially if you use insulin or take certain oral diabetes medicines. It is important to know how many carbohydrates you can safely have in each meal. This is different for every person. Your dietitian can help you calculate how many carbohydrates you should have at each meal and for snack. Foods that contain carbohydrates include:  Bread, cereal, rice, pasta, and crackers.  Potatoes and corn.  Peas, beans, and lentils.  Milk and yogurt.  Fruit and juice.  Desserts, such as cakes, cookies, ice cream, and candy.  How does alcohol affect me? Alcohol can cause a sudden decrease in blood  glucose (hypoglycemia), especially if you use insulin or take certain oral diabetes medicines. Hypoglycemia can be a life-threatening condition. Symptoms of hypoglycemia (sleepiness, dizziness, and confusion) are similar to symptoms of having too much alcohol. If your health care provider says that alcohol is safe for you, follow these guidelines:  Limit alcohol intake to no more than 1 drink per day for nonpregnant women and 2 drinks per day for men. One drink equals 12 oz of beer, 5 oz of wine, or 1 oz of hard liquor.  Do not drink on an empty stomach.  Keep yourself hydrated with water, diet soda, or unsweetened iced tea.  Keep in mind that regular soda, juice, and other mixers may contain a lot of sugar and must be counted as carbohydrates.  What are tips for following this plan? Reading food labels  Start by checking the serving size on the label. The amount of calories, carbohydrates, fats, and other nutrients listed on the label are based on one serving of the food. Many foods contain more than one serving per package.  Check the total grams (g) of carbohydrates in one serving. You can calculate the number of servings of carbohydrates in one serving by dividing the total carbohydrates by 15. For example, if a food has 30 g of total carbohydrates, it would be equal to 2 servings of carbohydrates.  Check the number of grams (g) of saturated and trans fats in one serving. Choose foods that have low or no amount of these fats.  Check the number of milligrams (mg) of sodium in one serving. Most people   should limit total sodium intake to less than 2,300 mg per day.  Always check the nutrition information of foods labeled as "low-fat" or "nonfat". These foods may be higher in added sugar or refined carbohydrates and should be avoided.  Talk to your dietitian to identify your daily goals for nutrients listed on the label. Shopping  Avoid buying canned, premade, or processed foods. These  foods tend to be high in fat, sodium, and added sugar.  Shop around the outside edge of the grocery store. This includes fresh fruits and vegetables, bulk grains, fresh meats, and fresh dairy. Cooking  Use low-heat cooking methods, such as baking, instead of high-heat cooking methods like deep frying.  Cook using healthy oils, such as olive, canola, or sunflower oil.  Avoid cooking with butter, cream, or high-fat meats. Meal planning  Eat meals and snacks regularly, preferably at the same times every day. Avoid going long periods of time without eating.  Eat foods high in fiber, such as fresh fruits, vegetables, beans, and whole grains. Talk to your dietitian about how many servings of carbohydrates you can eat at each meal.  Eat 4-6 ounces of lean protein each day, such as lean meat, chicken, fish, eggs, or tofu. 1 ounce is equal to 1 ounce of meat, chicken, or fish, 1 egg, or 1/4 cup of tofu.  Eat some foods each day that contain healthy fats, such as avocado, nuts, seeds, and fish. Lifestyle   Check your blood glucose regularly.  Exercise at least 30 minutes 5 or more days each week, or as told by your health care provider.  Take medicines as told by your health care provider.  Do not use any products that contain nicotine or tobacco, such as cigarettes and e-cigarettes. If you need help quitting, ask your health care provider.  Work with a Social worker or diabetes educator to identify strategies to manage stress and any emotional and social challenges. What are some questions to ask my health care provider?  Do I need to meet with a diabetes educator?  Do I need to meet with a dietitian?  What number can I call if I have questions?  When are the best times to check my blood glucose? Where to find more information:  American Diabetes Association: diabetes.org/food-and-fitness/food  Academy of Nutrition and Dietetics:  PokerClues.dk  [employer] of Diabetes and Digestive and Kidney Diseases (NIH): ContactWire.be Summary  A healthy meal plan will help you control your blood glucose and maintain a healthy lifestyle.  Working with a diet and nutrition specialist (dietitian) can help you make a meal plan that is best for you.  Keep in mind that carbohydrates and alcohol have immediate effects on your blood glucose levels. It is important to count carbohydrates and to use alcohol carefully. This information is not intended to replace advice given to you by your health care provider. Make sure you discuss any questions you have with your health care provider. Document Released: 05/14/2005 Document Revised: 09/21/2016 Document Reviewed: 09/21/2016 Elsevier Interactive Patient Education  2018 [place] Maintenance, Male A healthy lifestyle and preventive care is important for your health and wellness. Ask your health care provider about what schedule of regular examinations is right for you. What should I know about weight and diet? Eat a Healthy Diet  Eat plenty of vegetables, fruits, whole grains, low-fat dairy products, and lean protein.  Do not eat a lot of foods high in solid fats, added sugars, or salt.  Maintain a Healthy Weight Regular exercise can help you achieve or maintain a healthy weight. You should:  Do at least 150 minutes of exercise each week. The exercise should increase your heart rate and make you sweat (moderate-intensity exercise).  Do strength-training exercises at least twice a week.  Watch Your Levels of Cholesterol and Blood Lipids  Have your blood tested for lipids and cholesterol every 5 years starting at 65 years of age. If you are at high risk for heart disease, you should start having your blood tested when you are 65 years old.  You may need to have your cholesterol levels checked more often if: ? Your lipid or cholesterol levels are high. ? You are older than 65 years of age. ? You are at high risk for heart disease.  What should I know about cancer screening? Many types of cancers can be detected early and may often be prevented. Lung Cancer  You should be screened every year for lung cancer if: ? You are a current smoker who has smoked for at least 30 years. ? You are a former smoker who has quit within the past 15 years.  Talk to your health care provider about your screening options, when you should start screening, and how often you should be screened.  Colorectal Cancer  Routine colorectal cancer screening usually begins at 65 years of age and should be repeated every 5-10 years until you are 65 years old. You may need to be screened more often if early forms of precancerous polyps or small growths are found. Your health care provider may recommend screening at an earlier age if you have risk factors for colon cancer.  Your health care provider may recommend using home test kits to check for hidden blood in the stool.  A small camera at the end of a tube can be used to examine your colon (sigmoidoscopy or colonoscopy). This checks for the earliest forms of colorectal cancer.  Prostate and Testicular Cancer  Depending on your age and overall health, your health care provider may do certain tests to screen for prostate and testicular cancer.  Talk to your health care provider about any symptoms or concerns you have about testicular or prostate cancer.  Skin Cancer  Check your skin from head to toe regularly.  Tell your health care provider about any new moles or changes in moles, especially if: ? There is a change in a mole's size, shape, or color. ? You have a mole that is larger than a pencil eraser.  Always use sunscreen. Apply sunscreen liberally and repeat throughout the day.  Protect  yourself by wearing long sleeves, pants, a wide-brimmed hat, and sunglasses when outside.  What should I know about heart disease, diabetes, and high blood pressure?  If you are 67-78 years of age, have your blood pressure checked every 3-5 years. If you are 45 years of age or older, have your blood pressure checked every year. You should have your blood pressure measured twice-once when you are at a hospital or clinic, and once when you are not at a hospital or clinic. Record the average of the two measurements. To check your blood pressure when you are not at a hospital or clinic, you can use: ? An automated blood pressure machine at a pharmacy. ? A home blood pressure monitor.  Talk to your health care provider about your target blood pressure.  If you are between 16-56 years old, ask your health care provider if  you should take aspirin to prevent heart disease.  Have regular diabetes screenings by checking your fasting blood sugar level. ? If you are at a normal weight and have a low risk for diabetes, have this test once every three years after the age of 46. ? If you are overweight and have a high risk for diabetes, consider being tested at a younger age or more often.  A one-time screening for abdominal aortic aneurysm (AAA) by ultrasound is recommended for men aged 93-75 years who are current or former smokers. What should I know about preventing infection? Hepatitis B If you have a higher risk for hepatitis B, you should be screened for this virus. Talk with your health care provider to find out if you are at risk for hepatitis B infection. Hepatitis C Blood testing is recommended for:  Everyone born from [birth year] through 1963.  Anyone with known risk factors for hepatitis C.  Sexually Transmitted Diseases (STDs)  You should be screened each year for STDs including gonorrhea and chlamydia if: ? You are sexually active and are younger than 65 years of age. ? You are older than 65  years of age and your health care provider tells you that you are at risk for this type of infection. ? Your sexual activity has changed since you were last screened and you are at an increased risk for chlamydia or gonorrhea. Ask your health care provider if you are at risk.  Talk with your health care provider about whether you are at high risk of being infected with HIV. Your health care provider may recommend a prescription medicine to help prevent HIV infection.  What else can I do?  Schedule regular health, dental, and eye exams.  Stay current with your vaccines (immunizations).  Do not use any tobacco products, such as cigarettes, chewing tobacco, and e-cigarettes. If you need help quitting, ask your health care provider.  Limit alcohol intake to no more than 2 drinks per day. One drink equals 12 ounces of beer, 5 ounces of wine, or 1 ounces of hard liquor.  Do not use street drugs.  Do not share needles.  Ask your health care provider for help if you need support or information about quitting drugs.  Tell your health care provider if you often feel depressed.  Tell your health care provider if you have ever been abused or do not feel safe at home. This information is not intended to replace advice given to you by your health care provider. Make sure you discuss any questions you have with your health care provider. Document Released: 02/13/2008 Document Revised: 04/15/2016 Document Reviewed: 05/21/2015 Elsevier Interactive Patient Education  2018 [employer].     IF you received an x-ray today, you will receive an invoice from [facility]. Please contact [facility] at [phone] with questions or concerns regarding your invoice.   IF you received labwork today, you will receive an invoice from [place]. Please contact [facility] at [phone] with questions or concerns regarding your invoice.   Our billing staff will not be able to assist you with  questions regarding bills from these companies.  You will be contacted with the lab results as soon as they are available. The fastest way to get your results is to activate your My Chart account. Instructions are located on the last page of this paperwork. If you have not heard from [place] regarding the results in 2 weeks, please contact this office.

## 2018-03-01 NOTE — Progress Notes (Signed)
MRN: [id number]  Subjective:   [name] is a 67 y.o. male presenting for annual physical exam, medication refills and problem visit. Patient is married, works in [employer], counselling. Does not have children. Has good relationships at home, has a good support network.   Medical care team includes: PCP: Wardell Honour, MD  DM - Managed with Metformin 500mg  XR once daily. Patient checks blood sugar, runs 120s fasting. Practices healthy diet. Patient does not exercise, walks frequently for his work, does swim on occasion. Has tingling of hands at times, wakes him out of his sleep. Uses his hands a lot throughout the day. Denies smoking cigarettes. Patient denies blurred vision, polydipsia, chest pain, nausea, vomiting, abdominal pain, hematuria, polyuria, skin infections. Has 1-2 beers per day. Patient is due for eye exam. Has to set up appointment with [facility]. Denies foot concerns, skin infections that do not heal.   HTN - Not currently on medications. Used to take lisinopril, then losartan. Both were discontinued due to side effects of restlessness at night, interrupted his sleep.   HL - Not currently on a statin.   Urine symptoms - Reports several month history of intermittent dysuria, urinary frequency, nocturia (~3-4 times nightly).  Patient also endorses pelvic fullness.  Denies dysuria, hematuria, difficulty urinating, straining, incomplete emptying, urinary urgency.  He has had a PSA level done in the past which was normal but states that he has never seen a urologist and would like a referral.  He also reports that he has had more difficulty with UTIs in the past months.  Has needed to take a couple of rounds of antibiotics for this.  Health Maintenance: Patient's immunizations are up-to-date, he has had his colonoscopy and is not due again until 2022.  He has had a negative hepatitis C and HIV test completed in 2017.  We are working up urinary symptoms and in so doing  are addressing screen for prostate diagnoses.  Aaron [name] has a current medication list which includes the following prescription(s): metformin, atorvastatin, and enalapril. He is allergic to erythromycin and levaquin [levofloxacin in d5w].  Aaron [name]  has a past medical history of Diabetes mellitus without complication ([place]), GERD (gastroesophageal reflux disease), Hyperlipidemia, and Hypertension. Denies past surgical history. His family history includes Cancer in his mother; Cancer (age of onset: 27) in his father.   ROS  Objective:   Vitals: BP 138/82 (BP Location: Left Arm, Patient Position: Sitting, Cuff Size: Large)   Pulse 81   Temp 98.3 F (36.8 C) (Oral)   Resp 16   Ht 5\' 6"  (1.676 m)   Wt 204 lb (92.5 kg)   SpO2 96%   BMI 32.93 kg/m   The 10-year ASCVD risk score Mikey Bussing DC Jr., et al., 2013) is: 29.6%   Values used to calculate the score:     Age: 18 years     Sex: Male     Is Non-Hispanic African American: No     Diabetic: Yes     Tobacco smoker: No     Systolic Blood Pressure: 948 mmHg     Is BP treated: Yes     HDL Cholesterol: 45 mg/dL     Total Cholesterol: 182 mg/dL  BP Readings from Last 3 Encounters:  03/01/18 138/82  02/25/18 137/89  02/22/18 130/72    Physical Exam  Constitutional: He is oriented to person, place, and time. He appears well-developed and well-nourished.  HENT:  Mouth/Throat: Oropharynx is clear and moist.  TM's intact bilaterally, no effusions or erythema. Nasal turbinates pink and moist, nasal passages patent. No sinus tenderness. Oropharynx clear, mucous membranes moist, dentition in good repair.  Eyes: Pupils are equal, round, and reactive to light. Conjunctivae and EOM are normal. Right eye exhibits no discharge. Left eye exhibits no discharge. No scleral icterus.  Neck: Normal range of motion. Neck supple. No thyromegaly present.  Cardiovascular: Normal rate, regular rhythm and intact distal pulses. Exam reveals no gallop and no  friction rub.  No murmur heard. Pulmonary/Chest: No stridor. No respiratory distress. He has no wheezes. He has no rales.  Abdominal: Soft. Bowel sounds are normal. He exhibits no distension and no mass. There is no tenderness. There is no rebound and no guarding.  Genitourinary: Prostate is not enlarged and not tender.  Musculoskeletal: Normal range of motion. He exhibits no edema or tenderness.  Lymphadenopathy:    He has no cervical adenopathy.  Neurological: He is alert and oriented to person, place, and time. He has normal reflexes. He displays normal reflexes. Coordination normal.  Skin: Skin is warm and dry. No rash noted. No erythema. No pallor.  Psychiatric: He has a normal mood and affect.   Assessment and Plan :   Annual physical exam  Type 2 diabetes mellitus without complication, without long-term current use of insulin ([place]) - Plan: Hemoglobin A1c, Comprehensive metabolic panel, Thyroid Panel With TSH, metFORMIN (GLUCOPHAGE-XR) 500 MG 24 hr tablet  Essential hypertension - Plan: Thyroid Panel With TSH  Elevated blood pressure reading  Class 1 obesity without serious comorbidity with body mass index (BMI) of 32.0 to 32.9 in adult, unspecified obesity type - Plan: Lipid panel  Screening for deficiency anemia  Neck pain  Numbness and tingling  Chronic low back pain, unspecified back pain laterality, with sciatica presence unspecified  Chronic neck pain  Urinary frequency - Plan: Ambulatory referral to Urology  Pelvic fullness - Plan: Ambulatory referral to Urology  Office visit - A total of 35 minutes were spent face-to-face with the patient during this encounter and over half of that time was spent on discussing treatment options, potential for side effects with medications, counseling and coordination of care.  We will be starting enalapril for his hypertension, renal protection given that he also has diabetes.  Will maintain his current dose of metformin.  Add a  statin.  Counseled the patient needs to establish with a primary care doctor so that he can continue working on his chronic medical conditions and have good follow-up.  Patient is in agreement and will set up follow-up with a PCP at our practice of his choosing.  Regarding his urinary symptoms, I will refer to urology for consult.  Labs are pending.  Due to question of time, the fact that I had multiple patients waiting with their appointments and were on time, I counseled patient that I could not address his neck pain which is chronic in nature and his chronic back pain.  Recommended that he follow-up with his PCP on this. There are other unrelated non-urgent complaints, but due to the busy schedule and the amount of time I've already spent with him, time does not permit me to address these routine issues at today's visit. I've requested another appointment to review these additional issues. He can also follow up with his PCP.  CPE - Plans on setting up his own eye exam. Patient is medically stable.  Labs pending.  With [facility]. Encouraged patient to continue healthy lifestyle.  Jaynee Eagles, PA-C [facility] [phone] 03/01/2018  9:14 AM

## 2018-03-02 LAB — COMPREHENSIVE METABOLIC PANEL
ALT: 26 IU/L (ref 0–44)
AST: 18 IU/L (ref 0–40)
Albumin/Globulin Ratio: 1.6 (ref 1.2–2.2)
Albumin: 4.2 g/dL (ref 3.6–4.8)
Alkaline Phosphatase: 86 IU/L (ref 39–117)
BUN/Creatinine Ratio: 19 (ref 10–24)
BUN: 15 mg/dL (ref 8–27)
Bilirubin Total: 0.6 mg/dL (ref 0.0–1.2)
CO2: 22 mmol/L (ref 20–29)
Calcium: 9 mg/dL (ref 8.6–10.2)
Chloride: 101 mmol/L (ref 96–106)
Creatinine, Ser: 0.8 mg/dL (ref 0.76–1.27)
GFR calc Af Amer: 108 mL/min/{1.73_m2} (ref >59)
GFR calc non Af Amer: 94 mL/min/{1.73_m2} (ref >59)
Globulin, Total: 2.6 g/dL (ref 1.5–4.5)
Glucose: 189 mg/dL — ABNORMAL HIGH (ref 65–99)
Potassium: 4.4 mmol/L (ref 3.5–5.2)
Sodium: 137 mmol/L (ref 134–144)
Total Protein: 6.8 g/dL (ref 6.0–8.5)

## 2018-03-02 LAB — THYROID PANEL WITH TSH
Free Thyroxine Index: 2 (ref 1.2–4.9)
T3 Uptake Ratio: 28 % (ref 24–39)
T4, Total: 7 ug/dL (ref 4.5–12.0)
TSH: 2.13 u[IU]/mL (ref 0.450–4.500)

## 2018-03-02 LAB — LIPID PANEL
Chol/HDL Ratio: 4.7 ratio (ref 0.0–5.0)
Cholesterol, Total: 174 mg/dL (ref 100–199)
HDL: 37 mg/dL — ABNORMAL LOW (ref >39)
LDL Calculated: 101 mg/dL — ABNORMAL HIGH (ref 0–99)
Triglycerides: 181 mg/dL — ABNORMAL HIGH (ref 0–149)
VLDL Cholesterol Cal: 36 mg/dL (ref 5–40)

## 2018-03-02 LAB — HEMOGLOBIN A1C
Est. average glucose Bld gHb Est-mCnc: 200 mg/dL
Hgb A1c MFr Bld: 8.6 % — ABNORMAL HIGH (ref 4.8–5.6)

## 2018-03-02 LAB — PSA: Prostate Specific Ag, Serum: 1.7 ng/mL (ref 0.0–4.0)

## 2018-03-02 MED ORDER — BENAZEPRIL HCL 10 MG PO TABS: 10.0000 mg | ORAL_TABLET | Freq: Every day | ORAL | 0 refills | Status: DC

## 2018-03-09 DIAGNOSIS — H2513 Age-related nuclear cataract, bilateral: Secondary | ICD-10-CM | POA: Diagnosis not present

## 2018-03-09 LAB — HM DIABETES EYE EXAM

## 2018-03-14 ENCOUNTER — Encounter: Payer: Self-pay | Admitting: Urgent Care

## 2018-03-17 ENCOUNTER — Encounter: Payer: Self-pay | Admitting: *Deleted

## 2018-03-24 ENCOUNTER — Encounter: Payer: Self-pay | Admitting: *Deleted

## 2018-03-25 ENCOUNTER — Other Ambulatory Visit: Payer: Self-pay

## 2018-03-25 ENCOUNTER — Ambulatory Visit (INDEPENDENT_AMBULATORY_CARE_PROVIDER_SITE_OTHER): Payer: Medicare Other | Admitting: Family Medicine

## 2018-03-25 ENCOUNTER — Encounter: Payer: Self-pay | Admitting: Family Medicine

## 2018-03-25 VITALS — BP 138/82 | HR 77 | Temp 98.8°F | Resp 16 | Ht 66.93 in | Wt 201.0 lb

## 2018-03-25 DIAGNOSIS — E119 Type 2 diabetes mellitus without complications: Secondary | ICD-10-CM | POA: Diagnosis not present

## 2018-03-25 DIAGNOSIS — Z23 Encounter for immunization: Secondary | ICD-10-CM | POA: Diagnosis not present

## 2018-03-25 DIAGNOSIS — L409 Psoriasis, unspecified: Secondary | ICD-10-CM | POA: Diagnosis not present

## 2018-03-25 DIAGNOSIS — B353 Tinea pedis: Secondary | ICD-10-CM | POA: Diagnosis not present

## 2018-03-25 LAB — CBC WITH DIFFERENTIAL/PLATELET
Basophils Absolute: 0 10*3/uL (ref 0.0–0.2)
Basos: 1 %
EOS (ABSOLUTE): 0.1 10*3/uL (ref 0.0–0.4)
Eos: 1 %
Hematocrit: 48.8 % (ref 37.5–51.0)
Hemoglobin: 16 g/dL (ref 13.0–17.7)
Immature Grans (Abs): 0 10*3/uL (ref 0.0–0.1)
Immature Granulocytes: 0 %
Lymphocytes Absolute: 1.8 10*3/uL (ref 0.7–3.1)
Lymphs: 27 %
MCH: 27.8 pg (ref 26.6–33.0)
MCHC: 32.8 g/dL (ref 31.5–35.7)
MCV: 85 fL (ref 79–97)
Monocytes Absolute: 0.4 10*3/uL (ref 0.1–0.9)
Monocytes: 6 %
Neutrophils Absolute: 4.3 10*3/uL (ref 1.4–7.0)
Neutrophils: 65 %
Platelets: 274 10*3/uL (ref 150–450)
RBC: 5.76 x10E6/uL (ref 4.14–5.80)
RDW: 14.6 % (ref 12.3–15.4)
WBC: 6.6 10*3/uL (ref 3.4–10.8)

## 2018-03-25 LAB — COMPREHENSIVE METABOLIC PANEL
ALT: 32 IU/L (ref 0–44)
AST: 24 IU/L (ref 0–40)
Albumin/Globulin Ratio: 1.8 (ref 1.2–2.2)
Albumin: 4.6 g/dL (ref 3.6–4.8)
Alkaline Phosphatase: 94 IU/L (ref 39–117)
BUN/Creatinine Ratio: 15 (ref 10–24)
BUN: 13 mg/dL (ref 8–27)
Bilirubin Total: 0.3 mg/dL (ref 0.0–1.2)
CO2: 23 mmol/L (ref 20–29)
Calcium: 9.5 mg/dL (ref 8.6–10.2)
Chloride: 102 mmol/L (ref 96–106)
Creatinine, Ser: 0.85 mg/dL (ref 0.76–1.27)
GFR calc Af Amer: 106 mL/min/{1.73_m2} (ref >59)
GFR calc non Af Amer: 91 mL/min/{1.73_m2} (ref >59)
Globulin, Total: 2.5 g/dL (ref 1.5–4.5)
Glucose: 171 mg/dL — ABNORMAL HIGH (ref 65–99)
Potassium: 4.8 mmol/L (ref 3.5–5.2)
Sodium: 142 mmol/L (ref 134–144)
Total Protein: 7.1 g/dL (ref 6.0–8.5)

## 2018-03-25 MED ORDER — TRIAMCINOLONE ACETONIDE 0.1 % EX CREA: 1.0000 "application " | TOPICAL_CREAM | Freq: Two times a day (BID) | CUTANEOUS | 0 refills | Status: DC

## 2018-03-25 MED ORDER — KETOCONAZOLE 2 % EX CREA: 1.0000 "application " | TOPICAL_CREAM | Freq: Every day | CUTANEOUS | 0 refills | Status: DC

## 2018-03-25 MED ORDER — METFORMIN HCL ER 500 MG PO TB24: 1000.0000 mg | ORAL_TABLET | Freq: Every day | ORAL | 1 refills | Status: DC

## 2018-03-25 NOTE — Patient Instructions (Addendum)
Check sugars every other day. Check fasting in the morning before food; goal is for sugar to be 70-120. Other days check sugar two hours after supper; goal is for sugar to always be less than 180.    IF you received an x-ray today, you will receive an invoice from [facility]. Please contact [facility] at [phone] with questions or concerns regarding your invoice.   IF you received labwork today, you will receive an invoice from [facility]. Please contact [facility] at [phone] with questions or concerns regarding your invoice.   Our billing staff will not be able to assist you with questions regarding bills from these companies.  You will be contacted with the lab results as soon as they are available. The fastest way to get your results is to activate your My Chart account. Instructions are located on the last page of this paperwork. If you have not heard from us regarding the results in 2 weeks, please contact this office.      Diabetes Mellitus and Standards of Medical Care Managing diabetes (diabetes mellitus) can be complicated. Your diabetes treatment may be managed by a team of health care providers, including:  A diet and nutrition specialist (registered dietitian).  A nurse.  A certified diabetes educator (CDE).  A diabetes specialist (endocrinologist).  An eye doctor.  A primary care provider.  A dentist.  Your health care providers follow a schedule in order to help you get the best quality of care. The following schedule is a general guideline for your diabetes management plan. Your health care providers may also give you more specific instructions. HbA1c ( hemoglobin A1c) test This test provides information about blood sugar (glucose) control over the previous 2-3 months. It is used to check whether your diabetes management plan needs to be adjusted.  If you are meeting your treatment goals, this test is done at least 2 times a year.  If  you are not meeting treatment goals or if your treatment goals have changed, this test is done 4 times a year.  Blood pressure test  This test is done at every routine medical visit. For most people, the goal is less than 130/80. Ask your health care provider what your goal blood pressure should be. Dental and eye exams  Visit your dentist two times a year.  If you have type 1 diabetes, get an eye exam 3-5 years after you are diagnosed, and then once a year after your first exam. ? If you were diagnosed with type 1 diabetes as a child, get an eye exam when you are age 10 or older and have had diabetes for 3-5 years. After the first exam, you should get an eye exam once a year.  If you have type 2 diabetes, have an eye exam as soon as you are diagnosed, and then once a year after your first exam. Foot care exam  Visual foot exams are done at every routine medical visit. The exams check for cuts, bruises, redness, blisters, sores, or other problems with the feet.  A complete foot exam is done by your health care provider once a year. This exam includes an inspection of the structure and skin of your feet, and a check of the pulses and sensation in your feet. ? Type 1 diabetes: Get your first exam 3-5 years after diagnosis. ? Type 2 diabetes: Get your first exam as soon as you are diagnosed.  Check your feet every day for cuts, bruises, redness, blisters, or   sores. If you have any of these or other problems that are not healing, contact your health care provider. Kidney function test ( urine microalbumin)  This test is done once a year. ? Type 1 diabetes: Get your first test 5 years after diagnosis. ? Type 2 diabetes: Get your first test as soon as you are diagnosed.  If you have chronic kidney disease (CKD), get a serum creatinine and estimated glomerular filtration rate (eGFR) test once a year. Lipid profile (cholesterol, HDL, LDL, triglycerides)  This test should be done when you are  diagnosed with diabetes, and every 5 years after the first test. If you are on medicines to lower your cholesterol, you may need to get this test done every year. ? The goal for LDL is less than 100 mg/dL (5.5 mmol/L). If you are at high risk, the goal is less than 70 mg/dL (3.9 mmol/L). ? The goal for HDL is 40 mg/dL (2.2 mmol/L) for men and 50 mg/dL(2.8 mmol/L) for women. An HDL cholesterol of 60 mg/dL (3.3 mmol/L) or higher gives some protection against heart disease. ? The goal for triglycerides is less than 150 mg/dL (8.3 mmol/L). Immunizations  The yearly flu (influenza) vaccine is recommended for everyone 6 months or older who has diabetes.  The pneumonia (pneumococcal) vaccine is recommended for everyone 2 years or older who has diabetes. If you are 65 or older, you may get the pneumonia vaccine as a series of two separate shots.  The hepatitis B vaccine is recommended for adults shortly after they have been diagnosed with diabetes.  The Tdap (tetanus, diphtheria, and pertussis) vaccine should be given: ? According to normal childhood vaccination schedules, for children. ? Every 10 years, for adults who have diabetes.  The shingles vaccine is recommended for people who have had chicken pox and are 50 years or older. Mental and emotional health  Screening for symptoms of eating disorders, anxiety, and depression is recommended at the time of diagnosis and afterward as needed. If your screening shows that you have symptoms (you have a positive screening result), you may need further evaluation and be referred to a mental health care provider. Diabetes self-management education  Education about how to manage your diabetes is recommended at diagnosis and ongoing as needed. Treatment plan  Your treatment plan will be reviewed at every medical visit. Summary  Managing diabetes (diabetes mellitus) can be complicated. Your diabetes treatment may be managed by a team of health care  providers.  Your health care providers follow a schedule in order to help you get the best quality of care.  Standards of care including having regular physical exams, blood tests, blood pressure monitoring, immunizations, screening tests, and education about how to manage your diabetes.  Your health care providers may also give you more specific instructions based on your individual health. This information is not intended to replace advice given to you by your health care provider. Make sure you discuss any questions you have with your health care provider. Document Released: 06/14/2009 Document Revised: 05/15/2016 Document Reviewed: 05/15/2016 Elsevier Interactive Patient Education  2018 [employer]  

## 2018-03-25 NOTE — Progress Notes (Signed)
Subjective:    Patient ID: Aaron [name], male    DOB: 14-Oct-1950, 67 y.o.   MRN: [id number]  03/25/2018  Hypertension (1 month follow-up )    HPI This 67 y.o. male presents for one month follow-up for hypertension, DMII, hypercholesterolemia.  Underwent AWV with Mani on 03/01/18.   Benazepril and Atorvastatin therapy added.  Metformin increased to two daily.   Two or three days of exercise; 15-20 minutes.    Tingling in both arms intermittently.  +associated neck pain.   No upper extremity weakness.  No previous cervical spine imaging; did undergo recent imaging yet not of cervical spine.  No tingling in feet.  L first toe pain; bought new shoes and toe separater. Worried about gout or bunions.  No redness or swelling.  No trauma.  Psoriasis: needs refill of topical steroid cream.   BP Readings from Last 3 Encounters:  03/25/18 138/82  03/01/18 138/82  02/25/18 137/89   Wt Readings from Last 3 Encounters:  03/25/18 201 lb (91.2 kg)  03/01/18 204 lb (92.5 kg)  02/25/18 203 lb 9.6 oz (92.4 kg)   Immunization History  Administered Date(s) Administered  . Influenza,inj,Quad PF,6+ Mos 06/28/2014, 04/29/2015, 07/13/2016, 05/31/2017  . Pneumococcal Conjugate-13 04/29/2015  . Pneumococcal Polysaccharide-23 04/06/2014  . Td 03/30/2016  . Tdap 12/21/2005  . Zoster 11/25/2015    Review of Systems  Constitutional: Negative for activity change, appetite change, chills, diaphoresis, fatigue, fever and unexpected weight change.  HENT: Negative for congestion, dental problem, drooling, ear discharge, ear pain, facial swelling, hearing loss, mouth sores, nosebleeds, postnasal drip, rhinorrhea, sinus pressure, sneezing, sore throat, tinnitus, trouble swallowing and voice change.   Eyes: Negative for photophobia, pain, discharge, redness, itching and visual disturbance.  Respiratory: Negative for apnea, cough, choking, chest tightness, shortness of breath, wheezing and stridor.     Cardiovascular: Negative for chest pain, palpitations and leg swelling.  Gastrointestinal: Negative for abdominal pain, blood in stool, constipation, diarrhea, nausea and vomiting.  Endocrine: Negative for cold intolerance, heat intolerance, polydipsia, polyphagia and polyuria.  Genitourinary: Negative for decreased urine volume, difficulty urinating, discharge, dysuria, enuresis, flank pain, frequency, genital sores, hematuria, penile pain, penile swelling, scrotal swelling, testicular pain and urgency.  Musculoskeletal: Positive for arthralgias, neck pain and neck stiffness. Negative for back pain, gait problem, joint swelling and myalgias.  Skin: Negative for color change, pallor, rash and wound.  Allergic/Immunologic: Negative for environmental allergies, food allergies and immunocompromised state.  Neurological: Positive for numbness. Negative for dizziness, tremors, seizures, syncope, facial asymmetry, speech difficulty, weakness, light-headedness and headaches.  Hematological: Negative for adenopathy. Does not bruise/bleed easily.  Psychiatric/Behavioral: Negative for agitation, behavioral problems, confusion, decreased concentration, dysphoric mood, hallucinations, self-injury, sleep disturbance and suicidal ideas. The patient is not nervous/anxious and is not hyperactive.     Past Medical History:  Diagnosis Date  . Diabetes mellitus without complication ([place])   . GERD (gastroesophageal reflux disease)   . Hyperlipidemia   . Hypertension    History reviewed. No pertinent surgical history. Allergies  Allergen Reactions  . Erythromycin Itching  . Levaquin [Levofloxacin In D5w]     Light sensitivity    Current Outpatient Medications on File Prior to Visit  Medication Sig Dispense Refill  . atorvastatin (LIPITOR) 40 MG tablet Take 1 tablet (40 mg total) by mouth daily. 90 tablet 3  . benazepril (LOTENSIN) 10 MG tablet Take 1 tablet (10 mg total) by mouth daily. 90 tablet 0   No  current facility-administered medications  on file prior to visit.    Social History   Socioeconomic History  . Marital status: Married    Spouse name: Not on file  . Number of children: 0  . Years of education: Not on file  . Highest education level: Not on file  Occupational History  . Occupation: Press photographer  Social Needs  . Financial resource strain: Not on file  . Food insecurity:    Worry: Not on file    Inability: Not on file  . Transportation needs:    Medical: Not on file    Non-medical: Not on file  Tobacco Use  . Smoking status: Former Smoker    Types: Cigarettes  . Smokeless tobacco: Never Used  Substance and Sexual Activity  . Alcohol use: Yes    Alcohol/week: 0.0 oz    Comment: 2-3 beers a week  . Drug use: No  . Sexual activity: Yes  Lifestyle  . Physical activity:    Days per week: Not on file    Minutes per session: Not on file  . Stress: Not on file  Relationships  . Social connections:    Talks on phone: Not on file    Gets together: Not on file    Attends religious service: Not on file    Active member of club or organization: Not on file    Attends meetings of clubs or organizations: Not on file    Relationship status: Not on file  . Intimate partner violence:    Fear of current or ex partner: Not on file    Emotionally abused: Not on file    Physically abused: Not on file    Forced sexual activity: Not on file  Other Topics Concern  . Not on file  Social History Narrative   Marital status:  Married.       Children:   none      Lives:      Employment:  Press photographer      Tobacco:  none      Alcohol:  5-6 drinks per week      Drugs:   none      Exercise:      Seatbelt:      Education: [employer].   Family History  Problem Relation Age of Onset  . Cancer Mother        bone marrow cancer  . Cancer Father 64       brain cancer       Objective:    BP 138/82   Pulse 77   Temp 98.8 F (37.1 C) (Oral)   Resp 16   Ht 5' 6.93" (1.7 m)   Wt  201 lb (91.2 kg)   SpO2 96%   BMI 31.55 kg/m  Physical Exam  Constitutional: He is oriented to person, place, and time. He appears well-developed and well-nourished. No distress.  HENT:  Head: Normocephalic and atraumatic.  Right Ear: External ear normal.  Left Ear: External ear normal.  Nose: Nose normal.  Mouth/Throat: Oropharynx is clear and moist.  Eyes: Pupils are equal, round, and reactive to light. Conjunctivae and EOM are normal.  Neck: Normal range of motion. Neck supple. Carotid bruit is not present. No thyromegaly present.  Cardiovascular: Normal rate, regular rhythm, normal heart sounds and intact distal pulses. Exam reveals no gallop and no friction rub.  No murmur heard. Pulmonary/Chest: Effort normal and breath sounds normal. He has no wheezes. He has no rales.  Abdominal: Soft. Bowel sounds are  normal. He exhibits no distension and no mass. There is no tenderness. There is no rebound and no guarding.  Musculoskeletal:       Right shoulder: Normal.       Left shoulder: Normal.       Cervical back: He exhibits tenderness and pain. He exhibits normal range of motion, no bony tenderness and no spasm.       Feet:  Mild TTP along first left MTP region with mild bony prominence; no associated swelling or erythema.   Lymphadenopathy:    He has no cervical adenopathy.  Neurological: He is alert and oriented to person, place, and time. He has normal reflexes. No cranial nerve deficit. He exhibits normal muscle tone. Coordination normal.  Skin: Skin is warm and dry. Rash noted. He is not diaphoretic.  +Skin breakdown with fissuring interdigit space 3rd-4th.  Psychiatric: He has a normal mood and affect. His behavior is normal. Judgment and thought content normal.   No results found. Depression screen [facility] 2/9 03/25/2018 03/01/2018 02/25/2018 02/22/2018 08/18/2017  Decreased Interest 0 0 0 0 0  Down, Depressed, Hopeless 0 0 0 0 0  PHQ - 2 Score 0 0 0 0 0   Fall Risk  03/25/2018  02/25/2018 02/22/2018 08/18/2017 08/03/2017  Falls in the past year? No No No No Yes  Comment - - - - -  Number falls in past yr: - - - - 1  Injury with Fall? - - - - Yes  Risk Factor Category  - - - - -   Functional Status Survey: Is the patient deaf or have difficulty hearing?: No Does the patient have difficulty seeing, even when wearing glasses/contacts?: No Does the patient have difficulty concentrating, remembering, or making decisions?: No Does the patient have difficulty walking or climbing stairs?: No Does the patient have difficulty dressing or bathing?: No Does the patient have difficulty doing errands alone such as visiting a doctor's office or shopping?: No      Assessment & Plan:   1. Type 2 diabetes mellitus without complication, without long-term current use of insulin ([place])   2. Need for prophylactic vaccination against Streptococcus pneumoniae (pneumococcus)   3. Tinea pedis, left   4. Psoriasis     Diabetes mellitus type 2 uncontrolled.  Increase metformin XR 500 mg to 2 tablets daily.  Obtain urine microalbumin per current guidelines.  Continue Lotensin and atorvastatin therapies.  Referral to diabetic education.  Cannot tolerate higher doses of metformin.  Advised patient if he cannot get his hemoglobin A1c less than 7.0 by next visit, he will need to start a second diabetic agent.  Patient desires a 29-month trial of attempts at weight loss, dietary modification, exercise.  Left tinea pedis: New.  Prescription for ketoconazole cream.  Also recommend applying part of a cotton ball between 2 toes.  Psoriasis: Uncontrolled.  Prescription for triamcinolone cream provided.  Status post Prevnar 13.  Orders Placed This Encounter  Procedures  . Pneumococcal conjugate vaccine 13-valent IM  . Microalbumin / creatinine urine ratio  . Urinalysis, dipstick only  . Comprehensive metabolic panel  . CBC with Differential/Platelet  . Ambulatory referral to diabetic education      Referral Priority:   Routine    Referral Type:   Consultation    Referral Reason:   Specialty Services Required    Number of Visits Requested:   1   Meds ordered this encounter  Medications  . ketoconazole (NIZORAL) 2 % cream  Sig: Apply 1 application topically daily.    Dispense:  15 g    Refill:  0  . triamcinolone cream (KENALOG) 0.1 %    Sig: Apply 1 application topically 2 (two) times daily.    Dispense:  30 g    Refill:  0  . metFORMIN (GLUCOPHAGE-XR) 500 MG 24 hr tablet    Sig: Take 2 tablets (1,000 mg total) by mouth daily with breakfast.    Dispense:  180 tablet    Refill:  1    Return in about 3 months (around 06/25/2018) for follow-up chronic medical conditions MANI.   Kristi Elayne Guerin, M.D. Primary Care at [facility] [address] [place], [place]  [address] [phone] phone [phone] fax

## 2018-03-26 LAB — URINALYSIS, DIPSTICK ONLY
Bilirubin, UA: NEGATIVE
Glucose, UA: NEGATIVE
Ketones, UA: NEGATIVE
Leukocytes, UA: NEGATIVE
Nitrite, UA: NEGATIVE
Protein, UA: NEGATIVE
RBC, UA: NEGATIVE
Specific Gravity, UA: 1.023 (ref 1.005–1.030)
Urobilinogen, Ur: 0.2 mg/dL (ref 0.2–1.0)
pH, UA: 5 (ref 5.0–7.5)

## 2018-03-26 LAB — MICROALBUMIN / CREATININE URINE RATIO
Creatinine, Urine: 97.9 mg/dL
Microalb/Creat Ratio: <3.1 mg/g creat (ref 0.0–30.0)
Microalbumin, Urine: <3 ug/mL

## 2018-04-08 ENCOUNTER — Telehealth: Payer: Self-pay | Admitting: Family Medicine

## 2018-04-08 NOTE — Telephone Encounter (Signed)
Called pt to let them know we would need to reschedule their appt 06/23/18. Left VM °

## 2018-04-25 DIAGNOSIS — K219 Gastro-esophageal reflux disease without esophagitis: Secondary | ICD-10-CM | POA: Diagnosis not present

## 2018-04-27 ENCOUNTER — Encounter: Payer: Self-pay | Admitting: Urgent Care

## 2018-05-04 MED ORDER — AMLODIPINE BESYLATE 5 MG PO TABS: 5.0000 mg | ORAL_TABLET | Freq: Every day | ORAL | 3 refills | Status: DC

## 2018-05-04 NOTE — Telephone Encounter (Signed)
Scheduling staff please set up an office visit with patient for evaluation of his cough and benazepril.

## 2018-05-05 DIAGNOSIS — H43812 Vitreous degeneration, left eye: Secondary | ICD-10-CM | POA: Diagnosis not present

## 2018-05-05 DIAGNOSIS — H4312 Vitreous hemorrhage, left eye: Secondary | ICD-10-CM | POA: Diagnosis not present

## 2018-05-10 DIAGNOSIS — H43812 Vitreous degeneration, left eye: Secondary | ICD-10-CM | POA: Diagnosis not present

## 2018-05-14 ENCOUNTER — Other Ambulatory Visit: Payer: Self-pay

## 2018-05-14 ENCOUNTER — Ambulatory Visit (INDEPENDENT_AMBULATORY_CARE_PROVIDER_SITE_OTHER): Payer: Medicare Other | Admitting: Family Medicine

## 2018-05-14 ENCOUNTER — Encounter: Payer: Self-pay | Admitting: Family Medicine

## 2018-05-14 ENCOUNTER — Ambulatory Visit (INDEPENDENT_AMBULATORY_CARE_PROVIDER_SITE_OTHER): Payer: Medicare Other

## 2018-05-14 VITALS — BP 136/72 | HR 69 | Temp 97.8°F | Ht 66.0 in | Wt 198.2 lb

## 2018-05-14 DIAGNOSIS — J309 Allergic rhinitis, unspecified: Secondary | ICD-10-CM

## 2018-05-14 DIAGNOSIS — R059 Cough, unspecified: Secondary | ICD-10-CM

## 2018-05-14 DIAGNOSIS — R05 Cough: Secondary | ICD-10-CM | POA: Diagnosis not present

## 2018-05-14 MED ORDER — LEVOCETIRIZINE DIHYDROCHLORIDE 5 MG PO TABS: 5.0000 mg | ORAL_TABLET | Freq: Every evening | ORAL | 3 refills | Status: DC

## 2018-05-14 MED ORDER — IPRATROPIUM BROMIDE 0.03 % NA SOLN: 2.0000 | Freq: Two times a day (BID) | NASAL | 2 refills | Status: DC

## 2018-05-14 MED ORDER — HYDROCODONE-HOMATROPINE 5-1.5 MG/5ML PO SYRP: 5.0000 mL | ORAL_SOLUTION | Freq: Three times a day (TID) | ORAL | 0 refills | Status: DC | PRN

## 2018-05-14 NOTE — Patient Instructions (Addendum)
Allergic Rhinitis, Adult Allergic rhinitis is an allergic reaction that affects the mucous membrane inside the nose. It causes sneezing, a runny or stuffy nose, and the feeling of mucus going down the back of the throat (postnasal drip). Allergic rhinitis can be mild to severe. There are two types of allergic rhinitis:  Seasonal. This type is also called hay fever. It happens only during certain seasons.  Perennial. This type can happen at any time of the year.  What are the causes? This condition happens when the body's defense system (immune system) responds to certain harmless substances called allergens as though they were germs.  Seasonal allergic rhinitis is triggered by pollen, which can come from grasses, trees, and weeds. Perennial allergic rhinitis may be caused by:  House dust mites.  Pet dander.  Mold spores.  What are the signs or symptoms? Symptoms of this condition include:  Sneezing.  Runny or stuffy nose (nasal congestion).  Postnasal drip.  Itchy nose.  Tearing of the eyes.  Trouble sleeping.  Daytime sleepiness.  How is this diagnosed? This condition may be diagnosed based on:  Your medical history.  A physical exam.  Tests to check for related conditions, such as: ? Asthma. ? Pink eye. ? Ear infection. ? Upper respiratory infection.  Tests to find out which allergens trigger your symptoms. These may include skin or blood tests.  How is this treated? There is no cure for this condition, but treatment can help control symptoms. Treatment may include:  Taking medicines that block allergy symptoms, such as antihistamines. Medicine may be given as a shot, nasal spray, or pill.  Avoiding the allergen.  Desensitization. This treatment involves getting ongoing shots until your body becomes less sensitive to the allergen. This treatment may be done if other treatments do not help.  If taking medicine and avoiding the allergen does not work,  new, stronger medicines may be prescribed.  Follow these instructions at home:  Find out what you are allergic to. Common allergens include smoke, dust, and pollen.  Avoid the things you are allergic to. These are some things you can do to help avoid allergens: ? Replace carpet with wood, tile, or vinyl flooring. Carpet can trap dander and dust. ? Do not smoke. Do not allow smoking in your home. ? Change your heating and air conditioning filter at least once a month. ? During allergy season:  Keep windows closed as much as possible.  Plan outdoor activities when pollen counts are lowest. This is usually during the evening hours.  When coming indoors, change clothing and shower before sitting on furniture or bedding.  Take over-the-counter and prescription medicines only as told by your health care provider.  Keep all follow-up visits as told by your health care provider. This is important. Contact a health care provider if:  You have a fever.  You develop a persistent cough.  You make whistling sounds when you breathe (you wheeze).  Your symptoms interfere with your normal daily activities. Get help right away if:  You have shortness of breath. Summary  This condition can be managed by taking medicines as directed and avoiding allergens.  Contact your health care provider if you develop a persistent cough or fever.  During allergy season, keep windows closed as much as possible. This information is not intended to replace advice given to you by your health care provider. Make sure you discuss any questions you have with your health care provider. Document Released: 05/12/2001 Document Revised:  09/24/2016 Document Reviewed: 09/24/2016 Elsevier Interactive Patient Education  2018 [employer] D[employer] Postnasal drip is the feeling of mucus going down the back of your throat. Mucus is a slimy substance that moistens and cleans your nose and throat, as well as the  air pockets in face bones near your forehead and cheeks (sinuses). Small amounts of mucus pass from your nose and sinuses down the back of your throat all the time. This is normal. When you produce too much mucus or the mucus gets too thick, you can feel it. Some common causes of postnasal drip include:  Having more mucus because of: ? A cold or the flu. ? Allergies. ? Cold air. ? Certain medicines.  Having more mucus that is thicker because of: ? A sinus or nasal infection. ? Dry air. ? A food allergy.  Follow these instructions at home: Relieving discomfort  Gargle with a salt-water mixture 3-4 times a day or as needed. To make a salt-water mixture, completely dissolve -1 tsp of salt in 1 cup of warm water.  If the air in your home is dry, use a humidifier to add moisture to the air.  Use a saline spray or container (neti pot) to flush out the nose (nasal irrigation). These methods can help clear away mucus and keep the nasal passages moist. General instructions  Take over-the-counter and prescription medicines only as told by your health care provider.  Follow instructions from your health care provider about eating or drinking restrictions. You may need to avoid caffeine.  Avoid things that you know you are allergic to (allergens), like dust, mold, pollen, pets, or certain foods.  Drink enough fluid to keep your urine pale yellow.  Keep all follow-up visits as told by your health care provider. This is important. Contact a health care provider if:  You have a fever.  You have a sore throat.  You have difficulty swallowing.  You have headache.  You have sinus pain.  You have a cough that does not go away.  The mucus from your nose becomes thick and is green or yellow in color.  You have cold or flu symptoms that last more than 10 days. Summary  Postnasal drip is the feeling of mucus going down the back of your throat.  If your health care provider approves,  use nasal irrigation or a nasal spray 2?4 times a day.  Avoid things that you know you are allergic to (allergens), like dust, mold, pollen, pets, or certain foods. This information is not intended to replace advice given to you by your health care provider. Make sure you discuss any questions you have with your health care provider. Document Released: 11/30/2016 Document Revised: 11/30/2016 Document Reviewed: 11/30/2016 Elsevier Interactive Patient Education  [employer].   If you have lab work done today you will be contacted with your lab results within the next 2 weeks.  If you have not heard from [place] then please contact us. The fastest way to get your results is to register for My Chart.   IF you received an x-ray today, you will receive an invoice from [facility]. Please contact [facility] at [phone] with questions or concerns regarding your invoice.   IF you received labwork today, you will receive an invoice from [facility]. Please contact [facility] at [phone] with questions or concerns regarding your invoice.   Our billing staff will not be able to assist you with questions regarding bills from these companies.  You will be contacted with the lab results as soon as they are available. The fastest way to get your results is to activate your My Chart account. Instructions are located on the last page of this paperwork. If you have not heard from [place] regarding the results in 2 weeks, please contact this office.

## 2018-05-14 NOTE — Progress Notes (Signed)
Aaron [name], is a 67 y.o. male  PPI:[id number]  AYT:[id number]  DOB - [birth year]  CC:  Chief Complaint  Patient presents with  . Cough    going on 2-3 weeks      HPI: Aaron [name] is a 67 y.o. male is here today to for evaluation of persistent cough.   Chronic health problems include:has Gastroesophageal reflux disease without esophagitis; Type 2 diabetes mellitus without complication, without long-term current use of insulin ([place]); and Functional diarrhea on their problem list.   Aaron [name] has complained of persistent cough for at least 6-8 weeks. Originally suspected the cough was related to Benazepril which it was stopped approximately 3 weeks ago,however cough has continued. Ellen is followed by Dr. Katheran James at [facility] for management of reflux and was recently treated with Amoxicillin for a URI with sore throat. He takes Prilosec for acid reflux and noticed that this has helped with the sensations of fullness and burning in his throat. Hx of negative Barium Swallow several years ago. Cough is non-productive and not associated with food intake. He denies shortness of breath, chest tightness, wheezing, fever, or weakness. He reports a history of allergies although no prior treatment or consistent antihistamine therapy. He reports chronic nasal congestion although he is uncertain of post nasal drainage symptoms.  Current medications: Current Outpatient Medications:  .  amLODipine (NORVASC) 5 MG tablet, Take 1 tablet (5 mg total) by mouth daily., Disp: 90 tablet, Rfl: 3 .  atorvastatin (LIPITOR) 40 MG tablet, Take 1 tablet (40 mg total) by mouth daily., Disp: 90 tablet, Rfl: 3 .  metFORMIN (GLUCOPHAGE-XR) 500 MG 24 hr tablet, Take 2 tablets (1,000 mg total) by mouth daily with breakfast., Disp: 180 tablet, Rfl: 1 .  triamcinolone cream (KENALOG) 0.1 %, Apply 1 application topically 2 (two) times daily., Disp: 30 g, Rfl: 0   Pertinent family medical history: family history  includes Cancer in his mother; Cancer (age of onset: 40) in his father.   Allergies  Allergen Reactions  . Erythromycin Itching  . Levaquin [Levofloxacin In D5w]     Light sensitivity     Social History   Socioeconomic History  . Marital status: Married    Spouse name: Not on file  . Number of children: 0  . Years of education: Not on file  . Highest education level: Not on file  Occupational History  . Occupation: Press photographer  Social Needs  . Financial resource strain: Not on file  . Food insecurity:    Worry: Not on file    Inability: Not on file  . Transportation needs:    Medical: Not on file    Non-medical: Not on file  Tobacco Use  . Smoking status: Former Smoker    Types: Cigarettes  . Smokeless tobacco: Never Used  Substance and Sexual Activity  . Alcohol use: Yes    Alcohol/week: 0.0 standard drinks    Comment: 2-3 beers a week  . Drug use: No  . Sexual activity: Yes  Lifestyle  . Physical activity:    Days per week: Not on file    Minutes per session: Not on file  . Stress: Not on file  Relationships  . Social connections:    Talks on phone: Not on file    Gets together: Not on file    Attends religious service: Not on file    Active member of club or organization: Not on file    Attends meetings of clubs or organizations: Not on file  Relationship status: Not on file  . Intimate partner violence:    Fear of current or ex partner: Not on file    Emotionally abused: Not on file    Physically abused: Not on file    Forced sexual activity: Not on file  Other Topics Concern  . Not on file  Social History Narrative   Marital status:  Married.       Children:   none      Lives:      Employment:  Press photographer      Tobacco:  none      Alcohol:  5-6 drinks per week      Drugs:   none      Exercise:      Seatbelt:      Education: [employer].    Review of Systems: Pertinent negatives listed in HPI  Objective:   Vitals:   05/14/18 0823  BP: 136/72   Pulse: 69  Temp: 97.8 F (36.6 C)  SpO2: 96%    Physical Exam: Constitutional: Patient appears well-developed and well-nourished. No distress. HENT: Normocephalic, atraumatic, External right and left ear normal. Positive for nasal congestion. Rhinorrhea present.  Oropharynx is clear and moist.  Eyes: Conjunctivae and EOM are normal. PERRLA, no scleral icterus. Neck: Normal ROM. Neck supple. No JVD. No tracheal deviation. No thyromegaly. CVS: RRR, S1/S2 +, no murmurs, no gallops, no carotid bruit.  Pulmonary: Effort and breath sounds normal, no stridor, rhonchi, wheezes, rales. Cough is persistent and hacking non-productive. Lymphadenopathy: No lymphadenopathy noted, cervical, inguinal or axillary Neuro: Alert. Normal reflexes, muscle tone coordination. No cranial nerve deficit. Skin: Skin is warm and dry. No rash noted. Not diaphoretic. No erythema. No pallor. Psychiatric: Normal mood and affect. Behavior, judgment, thought content normal. Lab Results  Component Value Date   WBC 6.6 03/25/2018   HGB 16.0 03/25/2018   HCT 48.8 03/25/2018   MCV 85 03/25/2018   PLT 274 03/25/2018   Lab Results  Component Value Date   CREATININE 0.85 03/25/2018   BUN 13 03/25/2018   NA 142 03/25/2018   K 4.8 03/25/2018   CL 102 03/25/2018   CO2 23 03/25/2018    Lab Results  Component Value Date   HGBA1C 8.6 (H) 03/01/2018    Lipid Panel     Component Value Date/Time   CHOL 174 03/01/2018 0906   TRIG 181 (H) 03/01/2018 0906   HDL 37 (L) 03/01/2018 0906   CHOLHDL 4.7 03/01/2018 0906   CHOLHDL 3.9 04/29/2015 1339   VLDL 27 04/29/2015 1339   LDLCALC 101 (H) 03/01/2018 0906   Dg Chest 2 View  Result Date: 05/14/2018 CLINICAL DATA:  67 year old male with history of cough for the past 2 weeks. Nonsmoker. EXAM: CHEST - 2 VIEW COMPARISON:  Chest x-ray 02/22/2018. FINDINGS: Lung volumes are normal. No consolidative airspace disease. No pleural effusions. No pneumothorax. No pulmonary nodule  or mass noted. Pulmonary vasculature and the cardiomediastinal silhouette are within normal limits. IMPRESSION: No radiographic evidence of acute cardiopulmonary disease. Electronically Signed   By: Vinnie Langton M.D.   On: 05/14/2018 09:20       Assessment and plan:  Cough - Plan: DG Chest 2 View, negative of any abnormalities. Will continue symptomatic treatment for cough as I suspect cough is attributed to both allergies symptoms and acid reflux. Start Hycodan syrup 5 ml as needed every 8 hours. Cautioned given regarding sedative effects of medications. He will follow-up with PCP in 4 weeks if cough  has not completely resolved.  Allergic rhinitis, unspecified seasonality, unspecified trigger, will trial Atrovent nasal spray and Levocetirizine to management of allergy symptoms.    Return in about 4 weeks (around 06/11/2018), or cough.   Meds ordered this encounter  Medications  . levocetirizine (XYZAL) 5 MG tablet    Sig: Take 1 tablet (5 mg total) by mouth every evening.    Dispense:  90 tablet    Refill:  3  . ipratropium (ATROVENT) 0.03 % nasal spray    Sig: Place 2 sprays into both nostrils 2 (two) times daily.    Dispense:  30 mL    Refill:  2  . HYDROcodone-homatropine (HYCODAN) 5-1.5 MG/5ML syrup    Sig: Take 5 mLs by mouth every 8 (eight) hours as needed for cough.    Dispense:  120 mL    Refill:  0    Order Specific Question:   Supervising Provider    Answer:   Forrest Moron [id number]    The patient was given clear instructions to go to ER or return to medical center if symptoms don't improve, worsen or new problems develop. The patient verbalized understanding. The patient was told to call to get lab results if they haven't heard anything in the next week.     This note has been created with Surveyor, quantity. Any transcriptional errors are unintentional.    Carroll Sage. Kenton Kingfisher, FNP-C  Primary Care at  [place]

## 2018-06-08 DIAGNOSIS — H4312 Vitreous hemorrhage, left eye: Secondary | ICD-10-CM | POA: Diagnosis not present

## 2018-06-11 ENCOUNTER — Ambulatory Visit (INDEPENDENT_AMBULATORY_CARE_PROVIDER_SITE_OTHER): Payer: Medicare Other | Admitting: Family Medicine

## 2018-06-11 ENCOUNTER — Encounter: Payer: Self-pay | Admitting: Family Medicine

## 2018-06-11 VITALS — BP 135/83 | HR 75 | Temp 98.4°F | Resp 16 | Ht 66.0 in | Wt 199.2 lb

## 2018-06-11 DIAGNOSIS — H6592 Unspecified nonsuppurative otitis media, left ear: Secondary | ICD-10-CM | POA: Diagnosis not present

## 2018-06-11 DIAGNOSIS — R059 Cough, unspecified: Secondary | ICD-10-CM

## 2018-06-11 DIAGNOSIS — R058 Other specified cough: Secondary | ICD-10-CM

## 2018-06-11 DIAGNOSIS — R05 Cough: Secondary | ICD-10-CM | POA: Diagnosis not present

## 2018-06-11 DIAGNOSIS — Z23 Encounter for immunization: Secondary | ICD-10-CM

## 2018-06-11 MED ORDER — AMOXICILLIN 500 MG PO CAPS: 500.0000 mg | ORAL_CAPSULE | Freq: Three times a day (TID) | ORAL | 0 refills | Status: DC

## 2018-06-11 NOTE — Patient Instructions (Addendum)
Continue antihistamine, nasal spray as needed for allergies, as well as acid blocker for other potential causes of upper airway cough syndrome.  I am glad to hear that has improved.  Please return if any worsening of symptoms.  Left ear appears to be early middle ear infection.  Start amoxicillin.  Use of nasal spray may help decongest sinuses which can also cause some congestion behind the middle ear.  Thank you for coming in today.  Return to the clinic or go to the nearest emergency room if any of your symptoms worsen or new symptoms occur.  Otitis Media, Adult Otitis media occurs when there is inflammation and fluid in the middle ear. Your middle ear is a part of the ear that contains bones for hearing as well as air that helps send sounds to your brain. What are the causes? This condition is caused by a blockage in the eustachian tube. This tube drains fluid from the ear to the back of the nose (nasopharynx). A blockage in this tube can be caused by an object or by swelling (edema) in the tube. Problems that can cause a blockage include:  A cold or other upper respiratory infection.  Allergies.  An irritant, such as tobacco smoke.  Enlarged adenoids. The adenoids are areas of soft tissue located high in the back of the throat, behind the nose and the roof of the mouth.  A mass in the nasopharynx.  Damage to the ear caused by pressure changes (barotrauma).  What are the signs or symptoms? Symptoms of this condition include:  Ear pain.  A fever.  Decreased hearing.  A headache.  Tiredness (lethargy).  Fluid leaking from the ear.  Ringing in the ear.  How is this diagnosed? This condition is diagnosed with a physical exam. During the exam your health care provider will use an instrument called an otoscope to look into your ear and check for redness, swelling, and fluid. He or she will also ask about your symptoms. Your health care provider may also order tests, such  as:  A test to check the movement of the eardrum (pneumatic otoscopy). This test is done by squeezing a small amount of air into the ear.  A test that changes air pressure in the middle ear to check how well the eardrum moves and whether the eustachian tube is working (tympanogram).  How is this treated? This condition usually goes away on its own within 3-5 days. But if the condition is caused by a bacteria infection and does not go away own its own, or keeps coming back, your health care provider may:  Prescribe antibiotic medicines to treat the infection.  Prescribe or recommend medicines to control pain.  Follow these instructions at home:  Take over-the-counter and prescription medicines only as told by your health care provider.  If you were prescribed an antibiotic medicine, take it as told by your health care provider. Do not stop taking the antibiotic even if you start to feel better.  Keep all follow-up visits as told by your health care provider. This is important. Contact a health care provider if:  You have bleeding from your nose.  There is a lump on your neck.  You are not getting better in 5 days.  You feel worse instead of better. Get help right away if:  You have severe pain that is not controlled with medicine.  You have swelling, redness, or pain around your ear.  You have stiffness in your neck.  A part of your face is paralyzed.  The bone behind your ear (mastoid) is tender when you touch it.  You develop a severe headache. Summary  Otitis media is redness, soreness, and swelling of the middle ear.  This condition usually goes away on its own within 3-5 days.  If the problem does not go away in 3-5 days, your health care provider may prescribe or recommend medicines to treat your symptoms.  If you were prescribed an antibiotic medicine, take it as told by your health care provider. This information is not intended to replace advice given to you  by your health care provider. Make sure you discuss any questions you have with your health care provider. Document Released: 05/22/2004 Document Revised: 08/07/2016 Document Reviewed: 08/07/2016 Elsevier Interactive Patient Education  2018 [employer].  Cough, Adult Coughing is a reflex that clears your throat and your airways. Coughing helps to heal and protect your lungs. It is normal to cough occasionally, but a cough that happens with other symptoms or lasts a long time may be a sign of a condition that needs treatment. A cough may last only 2-3 weeks (acute), or it may last longer than 8 weeks (chronic). What are the causes? Coughing is commonly caused by:  Breathing in substances that irritate your lungs.  A viral or bacterial respiratory infection.  Allergies.  Asthma.  Postnasal drip.  Smoking.  Acid backing up from the stomach into the esophagus (gastroesophageal reflux).  Certain medicines.  Chronic lung problems, including COPD (or rarely, lung cancer).  Other medical conditions such as heart failure.  Follow these instructions at home: Pay attention to any changes in your symptoms. Take these actions to help with your discomfort:  Take medicines only as told by your health care provider. ? If you were prescribed an antibiotic medicine, take it as told by your health care provider. Do not stop taking the antibiotic even if you start to feel better. ? Talk with your health care provider before you take a cough suppressant medicine.  Drink enough fluid to keep your urine clear or pale yellow.  If the air is dry, use a cold steam vaporizer or humidifier in your bedroom or your home to help loosen secretions.  Avoid anything that causes you to cough at work or at home.  If your cough is worse at night, try sleeping in a semi-upright position.  Avoid cigarette smoke. If you smoke, quit smoking. If you need help quitting, ask your health care provider.  Avoid  caffeine.  Avoid alcohol.  Rest as needed.  Contact a health care provider if:  You have new symptoms.  You cough up pus.  Your cough does not get better after 2-3 weeks, or your cough gets worse.  You cannot control your cough with suppressant medicines and you are losing sleep.  You develop pain that is getting worse or pain that is not controlled with pain medicines.  You have a fever.  You have unexplained weight loss.  You have night sweats. Get help right away if:  You cough up blood.  You have difficulty breathing.  Your heartbeat is very fast. This information is not intended to replace advice given to you by your health care provider. Make sure you discuss any questions you have with your health care provider. Document Released: 02/13/2011 Document Revised: 01/23/2016 Document Reviewed: 10/24/2014 Elsevier Interactive Patient Education  2018 [employer].   Influenza Virus Vaccine (Flucelvax) What is this medicine? INFLUENZA VIRUS  VACCINE (in floo EN zuh VAHY ruhs vak SEEN) helps to reduce the risk of getting influenza also known as the flu. The vaccine only helps protect you against some strains of the flu. This medicine may be used for other purposes; ask your health care provider or pharmacist if you have questions. COMMON BRAND NAME(S): FLUCELVAX What should I tell my health care provider before I take this medicine? They need to know if you have any of these conditions: -bleeding disorder like hemophilia -fever or infection -Guillain-Barre syndrome or other neurological problems -immune system problems -infection with the human immunodeficiency virus (HIV) or AIDS -low blood platelet counts -multiple sclerosis -an unusual or allergic reaction to influenza virus vaccine, other medicines, foods, dyes or preservatives -pregnant or trying to get pregnant -breast-feeding How should I use this medicine? This vaccine is for injection into a muscle. It is  given by a health care professional. A copy of Vaccine Information Statements will be given before each vaccination. Read this sheet carefully each time. The sheet may change frequently. Talk to your pediatrician regarding the use of this medicine in children. Special care may be needed. Overdosage: If you think you've taken too much of this medicine contact a poison control center or emergency room at once. Overdosage: If you think you have taken too much of this medicine contact a poison control center or emergency room at once. NOTE: This medicine is only for you. Do not share this medicine with others. What if I miss a dose? This does not apply. What may interact with this medicine? -chemotherapy or radiation therapy -medicines that lower your immune system like etanercept, anakinra, infliximab, and adalimumab -medicines that treat or prevent blood clots like warfarin -phenytoin -steroid medicines like prednisone or cortisone -theophylline -vaccines This list may not describe all possible interactions. Give your health care provider a list of all the medicines, herbs, non-prescription drugs, or dietary supplements you use. Also tell them if you smoke, drink alcohol, or use illegal drugs. Some items may interact with your medicine. What should I watch for while using this medicine? Report any side effects that do not go away within 3 days to your doctor or health care professional. Call your health care provider if any unusual symptoms occur within 6 weeks of receiving this vaccine. You may still catch the flu, but the illness is not usually as bad. You cannot get the flu from the vaccine. The vaccine will not protect against colds or other illnesses that may cause fever. The vaccine is needed every year. What side effects may I notice from receiving this medicine? Side effects that you should report to your doctor or health care professional as soon as possible: -allergic reactions like  skin rash, itching or hives, swelling of the face, lips, or tongue Side effects that usually do not require medical attention (Report these to your doctor or health care professional if they continue or are bothersome.): -fever -headache -muscle aches and pains -pain, tenderness, redness, or swelling at the injection site -tiredness This list may not describe all possible side effects. Call your doctor for medical advice about side effects. You may report side effects to FDA at 1-800-FDA-1088. Where should I keep my medicine? The vaccine will be given by a health care professional in a clinic, pharmacy, doctor's office, or other health care setting. You will not be given vaccine doses to store at home. NOTE: This sheet is a summary. It may not cover all possible information. If you  have questions about this medicine, talk to your doctor, pharmacist, or health care provider.  2018 Elsevier/Gold Standard (2011-07-29 14:06:47)   If you have lab work done today you will be contacted with your lab results within the next 2 weeks.  If you have not heard from [place] then please contact us. The fastest way to get your results is to register for My Chart.   IF you received an x-ray today, you will receive an invoice from [facility]. Please contact [facility] at [phone] with questions or concerns regarding your invoice.   IF you received labwork today, you will receive an invoice from [facility]. Please contact [facility] at [phone] with questions or concerns regarding your invoice.   Our billing staff will not be able to assist you with questions regarding bills from these companies.  You will be contacted with the lab results as soon as they are available. The fastest way to get your results is to activate your My Chart account. Instructions are located on the last page of this paperwork. If you have not heard from [place] regarding the results in 2 weeks, please contact this  office.

## 2018-06-11 NOTE — Progress Notes (Signed)
Subjective:    Patient ID: Aaron [name], male    DOB: 1951-04-28, 67 y.o.   MRN: [id number]  HPI Aaron [name] is a 67 y.o. male Presents today for: Chief Complaint  Patient presents with  . Cough    f/u; states the cough is gone but his throat feels scratchy  . ear problem    states since having the cough his left ear feels clogged; denies pain    Seen for follow-up of cough.  Was most recently evaluated by Molli Barrows, FNP on 05/14/2018.  Initially thought to be ACE inhibitor cough, ACE stopped 3 weeks prior.  Followed by ear nose and throat for management of reflux, recently treated with amoxicillin for URI with sore throat.  On Prilosec for reflux.  Noted at that visit that he has history of allergies but not on antihistamine at that time.  Nonproductive cough.  Chest x-ray without any evidence of acute cardiopulmonary disease.  Suspected upper airway cough with reflux and allergies.  Start on hydrocodone cough syrup, levocetirizine, Atrovent nasal spray.  Cough is pretty much gone away. Did have some congestion this am.  Slight scratchy feeling in neck/throat and left ear feels clogged. No pain, no fever. No recent swimming. Taking omeprazole BID, continued xyzal, no more nasal spray needed. No fever.    Patient Active Problem List   Diagnosis Date Noted  . Gastroesophageal reflux disease without esophagitis 03/13/2017  . Type 2 diabetes mellitus without complication, without long-term current use of insulin ([place]) 03/13/2017  . Functional diarrhea 03/13/2017   Past Medical History:  Diagnosis Date  . Diabetes mellitus without complication ([place])   . GERD (gastroesophageal reflux disease)   . Hyperlipidemia   . Hypertension    No past surgical history on file. Allergies  Allergen Reactions  . Erythromycin Itching  . Levaquin [Levofloxacin In D5w]     Light sensitivity    Prior to Admission medications   Medication Sig Start Date End Date Taking? Authorizing  Provider  amLODipine (NORVASC) 5 MG tablet Take 1 tablet (5 mg total) by mouth daily. 05/04/18  Yes Jaynee Eagles, PA-C  atorvastatin (LIPITOR) 40 MG tablet Take 1 tablet (40 mg total) by mouth daily. 03/01/18  Yes Jaynee Eagles, PA-C  metFORMIN (GLUCOPHAGE-XR) 500 MG 24 hr tablet Take 2 tablets (1,000 mg total) by mouth daily with breakfast. 03/25/18  Yes Wardell Honour, MD  triamcinolone cream (KENALOG) 0.1 % Apply 1 application topically 2 (two) times daily. 03/25/18  Yes Wardell Honour, MD   Social History   Socioeconomic History  . Marital status: Married    Spouse name: Not on file  . Number of children: 0  . Years of education: Not on file  . Highest education level: Not on file  Occupational History  . Occupation: Press photographer  Social Needs  . Financial resource strain: Not on file  . Food insecurity:    Worry: Not on file    Inability: Not on file  . Transportation needs:    Medical: Not on file    Non-medical: Not on file  Tobacco Use  . Smoking status: Former Smoker    Types: Cigarettes  . Smokeless tobacco: Never Used  Substance and Sexual Activity  . Alcohol use: Yes    Alcohol/week: 0.0 standard drinks    Comment: 2-3 beers a week  . Drug use: No  . Sexual activity: Yes  Lifestyle  . Physical activity:    Days per week: Not  on file    Minutes per session: Not on file  . Stress: Not on file  Relationships  . Social connections:    Talks on phone: Not on file    Gets together: Not on file    Attends religious service: Not on file    Active member of club or organization: Not on file    Attends meetings of clubs or organizations: Not on file    Relationship status: Not on file  . Intimate partner violence:    Fear of current or ex partner: Not on file    Emotionally abused: Not on file    Physically abused: Not on file    Forced sexual activity: Not on file  Other Topics Concern  . Not on file  Social History Narrative   Marital status:  Married.       Children:    none      Lives:      Employment:  Press photographer      Tobacco:  none      Alcohol:  5-6 drinks per week      Drugs:   none      Exercise:      Seatbelt:      Education: [employer].    Review of Systems  Constitutional: Negative for fever.  HENT: Positive for ear pain (fullness, sore around ear, no rash, blocked. ).   Respiratory: Negative for cough and shortness of breath.        Objective:   Physical Exam  Constitutional: He is oriented to person, place, and time. He appears well-developed and well-nourished. No distress.  HENT:  Head: Normocephalic and atraumatic.  Right Ear: Tympanic membrane, external ear and ear canal normal.  Left Ear: External ear and ear canal normal. A middle ear effusion (white-yellow behind left tm. no perf seen. narrow canals bilat. ) is present.  Nose: No rhinorrhea.  Mouth/Throat: Oropharynx is clear and moist and mucous membranes are normal. No oropharyngeal exudate or posterior oropharyngeal erythema.  Eyes: Pupils are equal, round, and reactive to light. Conjunctivae are normal.  Neck: Neck supple.  Cardiovascular: Normal rate, regular rhythm, normal heart sounds and intact distal pulses.  No murmur heard. Pulmonary/Chest: Effort normal and breath sounds normal. He has no wheezes. He has no rhonchi. He has no rales.  Abdominal: Soft. There is no tenderness.  Lymphadenopathy:    He has no cervical adenopathy.  Neurological: He is alert and oriented to person, place, and time.  Skin: Skin is warm and dry. No rash noted.  Psychiatric: He has a normal mood and affect. His behavior is normal.  Vitals reviewed.  Vitals:   06/11/18 0841  BP: 135/83  Pulse: 75  Resp: 16  Temp: 98.4 F (36.9 C)  TempSrc: Oral  SpO2: 98%  Weight: 199 lb 3.2 oz (90.4 kg)  Height: 5\' 6"  (1.676 m)      Assessment & Plan:    Aaron [name] is a 67 y.o. male Cough - Plan: amoxicillin (AMOXIL) 500 MG capsule Upper airway cough syndrome  -Improved, continue  antihistamine for likely allergic contribution, nasal spray as needed, and continue proton pump inhibitor for reflux contribution.  RTC precautions if symptoms return.  Flu vaccine need - Plan: Flu Vaccine QUAD 36+ mos IM  Left otitis media with effusion - Plan: amoxicillin (AMOXIL) 500 MG capsule  -Early left otitis media likely based on the appearance and symptoms.  Okay to restart nasal spray as may have some  serous component, but will start amoxicillin as appears infected.  RTC precautions given.  Meds ordered this encounter  Medications  . amoxicillin (AMOXIL) 500 MG capsule    Sig: Take 1 capsule (500 mg total) by mouth 3 (three) times daily.    Dispense:  30 capsule    Refill:  0   Patient Instructions   Continue antihistamine, nasal spray as needed for allergies, as well as acid blocker for other potential causes of upper airway cough syndrome.  I am glad to hear that has improved.  Please return if any worsening of symptoms.  Left ear appears to be early middle ear infection.  Start amoxicillin.  Use of nasal spray may help decongest sinuses which can also cause some congestion behind the middle ear.  Thank you for coming in today.  Return to the clinic or go to the nearest emergency room if any of your symptoms worsen or new symptoms occur.  Otitis Media, Adult Otitis media occurs when there is inflammation and fluid in the middle ear. Your middle ear is a part of the ear that contains bones for hearing as well as air that helps send sounds to your brain. What are the causes? This condition is caused by a blockage in the eustachian tube. This tube drains fluid from the ear to the back of the nose (nasopharynx). A blockage in this tube can be caused by an object or by swelling (edema) in the tube. Problems that can cause a blockage include:  A cold or other upper respiratory infection.  Allergies.  An irritant, such as tobacco smoke.  Enlarged adenoids. The adenoids are  areas of soft tissue located high in the back of the throat, behind the nose and the roof of the mouth.  A mass in the nasopharynx.  Damage to the ear caused by pressure changes (barotrauma).  What are the signs or symptoms? Symptoms of this condition include:  Ear pain.  A fever.  Decreased hearing.  A headache.  Tiredness (lethargy).  Fluid leaking from the ear.  Ringing in the ear.  How is this diagnosed? This condition is diagnosed with a physical exam. During the exam your health care provider will use an instrument called an otoscope to look into your ear and check for redness, swelling, and fluid. He or she will also ask about your symptoms. Your health care provider may also order tests, such as:  A test to check the movement of the eardrum (pneumatic otoscopy). This test is done by squeezing a small amount of air into the ear.  A test that changes air pressure in the middle ear to check how well the eardrum moves and whether the eustachian tube is working (tympanogram).  How is this treated? This condition usually goes away on its own within 3-5 days. But if the condition is caused by a bacteria infection and does not go away own its own, or keeps coming back, your health care provider may:  Prescribe antibiotic medicines to treat the infection.  Prescribe or recommend medicines to control pain.  Follow these instructions at home:  Take over-the-counter and prescription medicines only as told by your health care provider.  If you were prescribed an antibiotic medicine, take it as told by your health care provider. Do not stop taking the antibiotic even if you start to feel better.  Keep all follow-up visits as told by your health care provider. This is important. Contact a health care provider if:  You have  bleeding from your nose.  There is a lump on your neck.  You are not getting better in 5 days.  You feel worse instead of better. Get help right away  if:  You have severe pain that is not controlled with medicine.  You have swelling, redness, or pain around your ear.  You have stiffness in your neck.  A part of your face is paralyzed.  The bone behind your ear (mastoid) is tender when you touch it.  You develop a severe headache. Summary  Otitis media is redness, soreness, and swelling of the middle ear.  This condition usually goes away on its own within 3-5 days.  If the problem does not go away in 3-5 days, your health care provider may prescribe or recommend medicines to treat your symptoms.  If you were prescribed an antibiotic medicine, take it as told by your health care provider. This information is not intended to replace advice given to you by your health care provider. Make sure you discuss any questions you have with your health care provider. Document Released: 05/22/2004 Document Revised: 08/07/2016 Document Reviewed: 08/07/2016 Elsevier Interactive Patient Education  2018 [place].  Cough, Adult Coughing is a reflex that clears your throat and your airways. Coughing helps to heal and protect your lungs. It is normal to cough occasionally, but a cough that happens with other symptoms or lasts a long time may be a sign of a condition that needs treatment. A cough may last only 2-3 weeks (acute), or it may last longer than 8 weeks (chronic). What are the causes? Coughing is commonly caused by:  Breathing in substances that irritate your lungs.  A viral or bacterial respiratory infection.  Allergies.  Asthma.  Postnasal drip.  Smoking.  Acid backing up from the stomach into the esophagus (gastroesophageal reflux).  Certain medicines.  Chronic lung problems, including COPD (or rarely, lung cancer).  Other medical conditions such as heart failure.  Follow these instructions at home: Pay attention to any changes in your symptoms. Take these actions to help with your discomfort:  Take medicines  only as told by your health care provider. ? If you were prescribed an antibiotic medicine, take it as told by your health care provider. Do not stop taking the antibiotic even if you start to feel better. ? Talk with your health care provider before you take a cough suppressant medicine.  Drink enough fluid to keep your urine clear or pale yellow.  If the air is dry, use a cold steam vaporizer or humidifier in your bedroom or your home to help loosen secretions.  Avoid anything that causes you to cough at work or at home.  If your cough is worse at night, try sleeping in a semi-upright position.  Avoid cigarette smoke. If you smoke, quit smoking. If you need help quitting, ask your health care provider.  Avoid caffeine.  Avoid alcohol.  Rest as needed.  Contact a health care provider if:  You have new symptoms.  You cough up pus.  Your cough does not get better after 2-3 weeks, or your cough gets worse.  You cannot control your cough with suppressant medicines and you are losing sleep.  You develop pain that is getting worse or pain that is not controlled with pain medicines.  You have a fever.  You have unexplained weight loss.  You have night sweats. Get help right away if:  You cough up blood.  You have difficulty breathing.  Your  heartbeat is very fast. This information is not intended to replace advice given to you by your health care provider. Make sure you discuss any questions you have with your health care provider. Document Released: 02/13/2011 Document Revised: 01/23/2016 Document Reviewed: 10/24/2014 Elsevier Interactive Patient Education  2018 [place].   Influenza Virus Vaccine (Flucelvax) What is this medicine? INFLUENZA VIRUS VACCINE (in floo EN zuh VAHY ruhs vak SEEN) helps to reduce the risk of getting influenza also known as the flu. The vaccine only helps protect you against some strains of the flu. This medicine may be used for other  purposes; ask your health care provider or pharmacist if you have questions. COMMON BRAND NAME(S): FLUCELVAX What should I tell my health care provider before I take this medicine? They need to know if you have any of these conditions: -bleeding disorder like hemophilia -fever or infection -Guillain-Barre syndrome or other neurological problems -immune system problems -infection with the human immunodeficiency virus (HIV) or AIDS -low blood platelet counts -multiple sclerosis -an unusual or allergic reaction to influenza virus vaccine, other medicines, foods, dyes or preservatives -pregnant or trying to get pregnant -breast-feeding How should I use this medicine? This vaccine is for injection into a muscle. It is given by a health care professional. A copy of Vaccine Information Statements will be given before each vaccination. Read this sheet carefully each time. The sheet may change frequently. Talk to your pediatrician regarding the use of this medicine in children. Special care may be needed. Overdosage: If you think you've taken too much of this medicine contact a poison control center or emergency room at once. Overdosage: If you think you have taken too much of this medicine contact a poison control center or emergency room at once. NOTE: This medicine is only for you. Do not share this medicine with others. What if I miss a dose? This does not apply. What may interact with this medicine? -chemotherapy or radiation therapy -medicines that lower your immune system like etanercept, anakinra, infliximab, and adalimumab -medicines that treat or prevent blood clots like warfarin -phenytoin -steroid medicines like prednisone or cortisone -theophylline -vaccines This list may not describe all possible interactions. Give your health care provider a list of all the medicines, herbs, non-prescription drugs, or dietary supplements you use. Also tell them if you smoke, drink alcohol, or use  illegal drugs. Some items may interact with your medicine. What should I watch for while using this medicine? Report any side effects that do not go away within 3 days to your doctor or health care professional. Call your health care provider if any unusual symptoms occur within 6 weeks of receiving this vaccine. You may still catch the flu, but the illness is not usually as bad. You cannot get the flu from the vaccine. The vaccine will not protect against colds or other illnesses that may cause fever. The vaccine is needed every year. What side effects may I notice from receiving this medicine? Side effects that you should report to your doctor or health care professional as soon as possible: -allergic reactions like skin rash, itching or hives, swelling of the face, lips, or tongue Side effects that usually do not require medical attention (Report these to your doctor or health care professional if they continue or are bothersome.): -fever -headache -muscle aches and pains -pain, tenderness, redness, or swelling at the injection site -tiredness This list may not describe all possible side effects. Call your doctor for medical advice about side effects.  You may report side effects to FDA at 1-800-FDA-1088. Where should I keep my medicine? The vaccine will be given by a health care professional in a clinic, pharmacy, doctor's office, or other health care setting. You will not be given vaccine doses to store at home. NOTE: This sheet is a summary. It may not cover all possible information. If you have questions about this medicine, talk to your doctor, pharmacist, or health care provider.  2018 Elsevier/Gold Standard (2011-07-29 14:06:47)   If you have lab work done today you will be contacted with your lab results within the next 2 weeks.  If you have not heard from [place] then please contact us. The fastest way to get your results is to register for My Chart.   IF you received an x-ray today,  you will receive an invoice from [facility]. Please contact [facility] at [phone] with questions or concerns regarding your invoice.   IF you received labwork today, you will receive an invoice from [place]. Please contact [facility] at [phone] with questions or concerns regarding your invoice.   Our billing staff will not be able to assist you with questions regarding bills from these companies.  You will be contacted with the lab results as soon as they are available. The fastest way to get your results is to activate your My Chart account. Instructions are located on the last page of this paperwork. If you have not heard from [place] regarding the results in 2 weeks, please contact this office.       Signed,   Merri Ray, MD [facility].  06/11/18 9:22 AM

## 2018-06-13 DIAGNOSIS — N401 Enlarged prostate with lower urinary tract symptoms: Secondary | ICD-10-CM | POA: Diagnosis not present

## 2018-06-13 DIAGNOSIS — R351 Nocturia: Secondary | ICD-10-CM | POA: Diagnosis not present

## 2018-06-23 ENCOUNTER — Ambulatory Visit: Payer: Medicare Other | Admitting: Urgent Care

## 2018-07-12 DIAGNOSIS — H4312 Vitreous hemorrhage, left eye: Secondary | ICD-10-CM | POA: Diagnosis not present

## 2018-08-04 ENCOUNTER — Encounter: Payer: Self-pay | Admitting: Family Medicine

## 2018-08-04 ENCOUNTER — Other Ambulatory Visit: Payer: Self-pay | Admitting: Family Medicine

## 2018-08-04 DIAGNOSIS — H6592 Unspecified nonsuppurative otitis media, left ear: Secondary | ICD-10-CM

## 2018-08-04 DIAGNOSIS — R059 Cough, unspecified: Secondary | ICD-10-CM

## 2018-08-04 DIAGNOSIS — R05 Cough: Secondary | ICD-10-CM

## 2018-08-13 ENCOUNTER — Ambulatory Visit (INDEPENDENT_AMBULATORY_CARE_PROVIDER_SITE_OTHER): Payer: Medicare Other | Admitting: Family Medicine

## 2018-08-13 ENCOUNTER — Encounter: Payer: Self-pay | Admitting: Family Medicine

## 2018-08-13 ENCOUNTER — Telehealth: Payer: Self-pay | Admitting: Family Medicine

## 2018-08-13 ENCOUNTER — Other Ambulatory Visit: Payer: Self-pay

## 2018-08-13 VITALS — BP 145/78 | HR 77 | Temp 98.7°F | Resp 18 | Ht 66.0 in | Wt 201.2 lb

## 2018-08-13 DIAGNOSIS — H65115 Acute and subacute allergic otitis media (mucoid) (sanguinous) (serous), recurrent, left ear: Secondary | ICD-10-CM | POA: Diagnosis not present

## 2018-08-13 MED ORDER — BACITRA-NEOMYCIN-POLYMYXIN-HC 1 % OP OINT: TOPICAL_OINTMENT | OPHTHALMIC | 0 refills | Status: DC

## 2018-08-13 MED ORDER — CIPROFLOXACIN-DEXAMETHASONE 0.3-0.1 % OT SUSP: 4.0000 [drp] | Freq: Two times a day (BID) | OTIC | 0 refills | Status: DC

## 2018-08-13 MED ORDER — NEOMYCIN-POLYMYXIN-HC 3.5-10000-1 OT SOLN: 3.0000 [drp] | Freq: Four times a day (QID) | OTIC | 0 refills | Status: AC

## 2018-08-13 NOTE — Telephone Encounter (Signed)
Pt. Called requesting ear drops prescribed by NP Harris be changed to something less expensive and the pharmacy changed to [facility] at [address] . Physical message handed over to [place], [place] to Ms. Harris.

## 2018-08-13 NOTE — Progress Notes (Signed)
.   Acute Office Visit  Subjective:    Patient ID: Aaron [name], male    DOB: 1951/07/13, 67 y.o.   MRN: [id number]  Chief Complaint  Patient presents with  . Ear Pain    left ear pain kind of a follow up saw dr ghreene for it     HPI Patient is in today for left pain x 1 week. History of recurrent ear infection and problems since childhood. He sees an ENT as needed for this problem. Last saw Dr. Carlota Raspberry for this issue over 1 month ago and was treated with an oral antibiotic which resolved the ear infection. Today he complains of worsening left ear pain, with occasional brownish discharge. Endorses tenderness behind the ear. Used OTC analgesics without improvement. Besides rhinorrhea, he is negative of URI symptoms or fever.  Past Medical History:  Diagnosis Date  . Diabetes mellitus without complication ([place])   . GERD (gastroesophageal reflux disease)   . Hyperlipidemia   . Hypertension     No past surgical history on file.  Family History  Problem Relation Age of Onset  . Cancer Mother        bone marrow cancer  . Cancer Father 32       brain cancer    Social History   Socioeconomic History  . Marital status: Married    Spouse name: Not on file  . Number of children: 0  . Years of education: Not on file  . Highest education level: Not on file  Occupational History  . Occupation: Press photographer  Social Needs  . Financial resource strain: Not on file  . Food insecurity:    Worry: Not on file    Inability: Not on file  . Transportation needs:    Medical: Not on file    Non-medical: Not on file  Tobacco Use  . Smoking status: Former Smoker    Types: Cigarettes  . Smokeless tobacco: Never Used  Substance and Sexual Activity  . Alcohol use: Yes    Alcohol/week: 0.0 standard drinks    Comment: 2-3 beers a week  . Drug use: No  . Sexual activity: Yes  Lifestyle  . Physical activity:    Days per week: Not on file    Minutes per session: Not on file  . Stress: Not  on file  Relationships  . Social connections:    Talks on phone: Not on file    Gets together: Not on file    Attends religious service: Not on file    Active member of club or organization: Not on file    Attends meetings of clubs or organizations: Not on file    Relationship status: Not on file  . Intimate partner violence:    Fear of current or ex partner: Not on file    Emotionally abused: Not on file    Physically abused: Not on file    Forced sexual activity: Not on file  Other Topics Concern  . Not on file  Social History Narrative   Marital status:  Married.       Children:   none      Lives:      Employment:  Press photographer      Tobacco:  none      Alcohol:  5-6 drinks per week      Drugs:   none      Exercise:      Seatbelt:      Education: [employer].  Outpatient Medications Prior to Visit  Medication Sig Dispense Refill  . amLODipine (NORVASC) 5 MG tablet Take 1 tablet (5 mg total) by mouth daily. 90 tablet 3  . atorvastatin (LIPITOR) 40 MG tablet Take 1 tablet (40 mg total) by mouth daily. 90 tablet 3  . metFORMIN (GLUCOPHAGE-XR) 500 MG 24 hr tablet Take 2 tablets (1,000 mg total) by mouth daily with breakfast. 180 tablet 1  . triamcinolone cream (KENALOG) 0.1 % Apply 1 application topically 2 (two) times daily. 30 g 0  . amoxicillin (AMOXIL) 500 MG capsule Take 1 capsule (500 mg total) by mouth 3 (three) times daily. 30 capsule 0   No facility-administered medications prior to visit.     Allergies  Allergen Reactions  . Erythromycin Itching  . Levaquin [Levofloxacin In D5w]     Light sensitivity     ROS Pertinent negatives listed in HPI    Objective:    Physical Exam   Wt Readings from Last 3 Encounters:  06/11/18 199 lb 3.2 oz (90.4 kg)  05/14/18 198 lb 3.2 oz (89.9 kg)  03/25/18 201 lb (91.2 kg)     General Appearance:    Alert, cooperative, no distress  HENT:   ENT exam normal, no neck nodes or sinus tenderness and left TM mildly  erythematous, tenderness and edematous ear canal   Eyes:    PERRL, conjunctiva/corneas clear, EOM's intact       Lungs:     Clear to auscultation bilaterally, respirations unlabored  Heart:    Regular rate and rhythm. No murmurs or gallops noted   Neurologic:   Awake, alert, oriented x 3. No apparent focal neurological           defect.     Lab Results  Component Value Date   TSH 2.130 03/01/2018   Lab Results  Component Value Date   WBC 6.6 03/25/2018   HGB 16.0 03/25/2018   HCT 48.8 03/25/2018   MCV 85 03/25/2018   PLT 274 03/25/2018   Lab Results  Component Value Date   NA 142 03/25/2018   K 4.8 03/25/2018   CO2 23 03/25/2018   GLUCOSE 171 (H) 03/25/2018   BUN 13 03/25/2018   CREATININE 0.85 03/25/2018   BILITOT 0.3 03/25/2018   ALKPHOS 94 03/25/2018   AST 24 03/25/2018   ALT 32 03/25/2018   PROT 7.1 03/25/2018   ALBUMIN 4.6 03/25/2018   CALCIUM 9.5 03/25/2018   Lab Results  Component Value Date   CHOL 174 03/01/2018   Lab Results  Component Value Date   HDL 37 (L) 03/01/2018   Lab Results  Component Value Date   LDLCALC 101 (H) 03/01/2018   Lab Results  Component Value Date   TRIG 181 (H) 03/01/2018   Lab Results  Component Value Date   CHOLHDL 4.7 03/01/2018   Lab Results  Component Value Date   HGBA1C 8.6 (H) 03/01/2018       Assessment & Plan:   Problem List Items Addressed This Visit    None    Visit Diagnoses    Recurrent acute allergic otitis media of left ear    -  Primary -will trial Ciprodex 4 drops BID x 7 days. Patient has a sensitivity to light with IV levoquin in the past, although endorses no issues with antibiotic ear drops. He will follow-up with ENT if symptoms do not improve.       Meds ordered this encounter  Medications  . ciprofloxacin-dexamethasone (CIPRODEX) OTIC suspension  Sig: Place 4 drops into the left ear 2 (two) times daily for 7 days.    Dispense:  2.8 mL    Refill:  0       Carroll Sage. Kenton Kingfisher,  FNP-C Nurse Practitioner (PRN Staff)  Primary [facility] at [facility] [address] [place], [place]

## 2018-08-13 NOTE — Addendum Note (Signed)
Addended by: Scot Jun on: 08/13/2018 10:15 AM   Modules accepted: Orders

## 2018-08-13 NOTE — Patient Instructions (Addendum)
     If you have lab work done today you will be contacted with your lab results within the next 2 weeks.  If you have not heard from [place] then please contact us. The fastest way to get your results is to register for My Chart.     IF you received an x-ray today, you will receive an invoice from [facility]. Please contact [facility] at [phone] with questions or concerns regarding your invoice.   IF you received labwork today, you will receive an invoice from [facility]. Please contact [facility] at [phone] with questions or concerns regarding your invoice.   Our billing staff will not be able to assist you with questions regarding bills from these companies.  You will be contacted with the lab results as soon as they are available. The fastest way to get your results is to activate your My Chart account. Instructions are located on the last page of this paperwork. If you have not heard from [place] regarding the results in 2 weeks, please contact this office.       Otitis Media, Adult Otitis media is redness, soreness, and puffiness (swelling) in the space just behind your eardrum (middle ear). It may be caused by allergies or infection. It often happens along with a cold. Follow these instructions at home:  Take your medicine as told. Finish it even if you start to feel better.  Only take over-the-counter or prescription medicines for pain, discomfort, or fever as told by your doctor.  Follow up with your doctor as told. Contact a doctor if:  You have otitis media only in one ear, or bleeding from your nose, or both.  You notice a lump on your neck.  You are not getting better in 3-5 days.  You feel worse instead of better. Get help right away if:  You have pain that is not helped with medicine.  You have puffiness, redness, or pain around your ear.  You get a stiff neck.  You cannot move part of your face (paralysis).  You notice that the bone  behind your ear hurts when you touch it. This information is not intended to replace advice given to you by your health care provider. Make sure you discuss any questions you have with your health care provider. Document Released: 02/03/2008 Document Revised: 01/23/2016 Document Reviewed: 03/14/2013 Elsevier Interactive Patient Education  2017 [employer].

## 2018-08-13 NOTE — Addendum Note (Signed)
Addended by: Scot Jun on: 08/13/2018 11:07 AM   Modules accepted: Orders

## 2018-09-05 DIAGNOSIS — B369 Superficial mycosis, unspecified: Secondary | ICD-10-CM | POA: Diagnosis not present

## 2018-09-05 DIAGNOSIS — H6240 Otitis externa in other diseases classified elsewhere, unspecified ear: Secondary | ICD-10-CM | POA: Diagnosis not present

## 2018-09-26 DIAGNOSIS — L299 Pruritus, unspecified: Secondary | ICD-10-CM | POA: Diagnosis not present

## 2018-09-26 DIAGNOSIS — H938X2 Other specified disorders of left ear: Secondary | ICD-10-CM | POA: Diagnosis not present

## 2018-09-29 ENCOUNTER — Ambulatory Visit: Payer: Self-pay

## 2018-09-29 ENCOUNTER — Encounter: Payer: Self-pay | Admitting: Family Medicine

## 2018-09-29 NOTE — Telephone Encounter (Signed)
Pt called to say he has had dizziness for about 1 week. He says it is mild and he is able to work. He is at work today.  He is unsure what is causing this dizziness. He is seeing an ear specialist for an ear fungus treatment. That specialist denies that his condition would cause the dizziness. He is [employer] and chesks his BS's He says it is usually around 120. He takes Norvasc for BP but has not had his BP checked. Call placed to [facility] office per pt request. Pt is available for OV Saturday or Monday. Per Corbin Ade this triage will be routed to office and they will call pt with next available appointment. Care advice reviewed with patient.  Patient agrees to advice.  Reason for Disposition . [1] MILD dizziness (e.g., walking normally) AND [2] has NOT been evaluated by physician for this  (Exception: dizziness caused by heat exposure, sudden standing, or poor fluid intake)  Answer Assessment - Initial Assessment Questions 1. DESCRIPTION: "Describe your dizziness."     lightheaded 2. LIGHTHEADED: "Do you feel lightheaded?" (e.g., somewhat faint, woozy, weak upon standing)     no 3. VERTIGO: "Do you feel like either you or the room is spinning or tilting?" (i.e. vertigo)    no 4. SEVERITY: "How bad is it?"  "Do you feel like you are going to faint?" "Can you stand and walk?"   - MILD - walking normally   - MODERATE - interferes with normal activities (e.g., work, school)    - SEVERE - unable to stand, requires support to walk, feels like passing out now.      mild 5. ONSET:  "When did the dizziness begin?"     1 week 6. AGGRAVATING FACTORS: "Does anything make it worse?" (e.g., standing, change in head position)     no 7. HEART RATE: "Can you tell me your heart rate?" "How many beats in 15 seconds?"  (Note: not all patients can do this)       no 8. CAUSE: "What do you think is causing the dizziness?"     unsure 9. RECURRENT SYMPTOM: "Have you had dizziness before?" If so, ask: "When was the last  time?" "What happened that time?"     no 10. OTHER SYMPTOMS: "Do you have any other symptoms?" (e.g., fever, chest pain, vomiting, diarrhea, bleeding)       No but is being treated for ear fungus 11. PREGNANCY: "Is there any chance you are pregnant?" "When was your last menstrual period?"       N/A  Protocols used: DIZZINESS [facility]

## 2018-10-01 ENCOUNTER — Encounter: Payer: Self-pay | Admitting: Emergency Medicine

## 2018-10-01 ENCOUNTER — Other Ambulatory Visit: Payer: Self-pay

## 2018-10-01 ENCOUNTER — Ambulatory Visit (INDEPENDENT_AMBULATORY_CARE_PROVIDER_SITE_OTHER): Payer: Medicare Other | Admitting: Emergency Medicine

## 2018-10-01 VITALS — BP 128/75 | HR 69 | Temp 97.8°F | Resp 16 | Ht 66.0 in | Wt 205.6 lb

## 2018-10-01 DIAGNOSIS — R42 Dizziness and giddiness: Secondary | ICD-10-CM | POA: Diagnosis not present

## 2018-10-01 DIAGNOSIS — E119 Type 2 diabetes mellitus without complications: Secondary | ICD-10-CM | POA: Diagnosis not present

## 2018-10-01 DIAGNOSIS — E1165 Type 2 diabetes mellitus with hyperglycemia: Secondary | ICD-10-CM

## 2018-10-01 LAB — POCT CBC
Granulocyte percent: 66.9 %G (ref 37–80)
HCT, POC: 44.9 % — AB (ref 29–41)
Hemoglobin: 15 g/dL — AB (ref 11–14.6)
Lymph, poc: 2 (ref 0.6–3.4)
MCH, POC: 27.8 pg (ref 27–31.2)
MCHC: 33.4 g/dL (ref 31.8–35.4)
MCV: 83.1 fL (ref 76–111)
MID (cbc): 0.4 (ref 0–0.9)
MPV: 7.4 fL (ref 0–99.8)
POC Granulocyte: 5 (ref 2–6.9)
POC LYMPH PERCENT: 27.1 %L (ref 10–50)
POC MID %: 6 %M (ref 0–12)
Platelet Count, POC: 280 10*3/uL (ref 142–424)
RBC: 5.4 M/uL (ref 4.69–6.13)
RDW, POC: 14 %
WBC: 7.4 10*3/uL (ref 4.6–10.2)

## 2018-10-01 LAB — POCT GLYCOSYLATED HEMOGLOBIN (HGB A1C): Hemoglobin A1C: 8.8 % — AB (ref 4.0–5.6)

## 2018-10-01 LAB — GLUCOSE, POCT (MANUAL RESULT ENTRY): POC Glucose: 213 mg/dl — AB (ref 70–99)

## 2018-10-01 MED ORDER — DAPAGLIFLOZIN PROPANEDIOL 5 MG PO TABS: 5.0000 mg | ORAL_TABLET | Freq: Every day | ORAL | 1 refills | Status: AC

## 2018-10-01 NOTE — Patient Instructions (Addendum)
   If you have lab work done today you will be contacted with your lab results within the next 2 weeks.  If you have not heard from us then please contact us. The fastest way to get your results is to register for My Chart.   IF you received an x-ray today, you will receive an invoice from [facility]. Please contact [facility] at [phone] with questions or concerns regarding your invoice.   IF you received labwork today, you will receive an invoice from [facility]. Please contact [facility] at [phone] with questions or concerns regarding your invoice.   Our billing staff will not be able to assist you with questions regarding bills from these companies.  You will be contacted with the lab results as soon as they are available. The fastest way to get your results is to activate your My Chart account. Instructions are located on the last page of this paperwork. If you have not heard from us regarding the results in 2 weeks, please contact this office.     Diabetes Mellitus and Nutrition, Adult When you have diabetes (diabetes mellitus), it is very important to have healthy eating habits because your blood sugar (glucose) levels are greatly affected by what you eat and drink. Eating healthy foods in the appropriate amounts, at about the same times every day, can help you:  Control your blood glucose.  Lower your risk of heart disease.  Improve your blood pressure.  Reach or maintain a healthy weight. Every person with diabetes is different, and each person has different needs for a meal plan. Your health care provider may recommend that you work with a diet and nutrition specialist (dietitian) to make a meal plan that is best for you. Your meal plan may vary depending on factors such as:  The calories you need.  The medicines you take.  Your weight.  Your blood glucose, blood pressure, and cholesterol levels.  Your activity level.  Other health conditions  you have, such as heart or kidney disease. How do carbohydrates affect me? Carbohydrates, also called carbs, affect your blood glucose level more than any other type of food. Eating carbs naturally raises the amount of glucose in your blood. Carb counting is a method for keeping track of how many carbs you eat. Counting carbs is important to keep your blood glucose at a healthy level, especially if you use insulin or take certain oral diabetes medicines. It is important to know how many carbs you can safely have in each meal. This is different for every person. Your dietitian can help you calculate how many carbs you should have at each meal and for each snack. Foods that contain carbs include:  Bread, cereal, rice, pasta, and crackers.  Potatoes and corn.  Peas, beans, and lentils.  Milk and yogurt.  Fruit and juice.  Desserts, such as cakes, cookies, ice cream, and candy. How does alcohol affect me? Alcohol can cause a sudden decrease in blood glucose (hypoglycemia), especially if you use insulin or take certain oral diabetes medicines. Hypoglycemia can be a life-threatening condition. Symptoms of hypoglycemia (sleepiness, dizziness, and confusion) are similar to symptoms of having too much alcohol. If your health care provider says that alcohol is safe for you, follow these guidelines:  Limit alcohol intake to no more than 1 drink per day for nonpregnant women and 2 drinks per day for men. One drink equals 12 oz of beer, 5 oz of wine, or 1 oz of hard liquor.    Do not drink on an empty stomach.  Keep yourself hydrated with water, diet soda, or unsweetened iced tea.  Keep in mind that regular soda, juice, and other mixers may contain a lot of sugar and must be counted as carbs. What are tips for following this plan?  Reading food labels  Start by checking the serving size on the "Nutrition Facts" label of packaged foods and drinks. The amount of calories, carbs, fats, and other  nutrients listed on the label is based on one serving of the item. Many items contain more than one serving per package.  Check the total grams (g) of carbs in one serving. You can calculate the number of servings of carbs in one serving by dividing the total carbs by 15. For example, if a food has 30 g of total carbs, it would be equal to 2 servings of carbs.  Check the number of grams (g) of saturated and trans fats in one serving. Choose foods that have low or no amount of these fats.  Check the number of milligrams (mg) of salt (sodium) in one serving. Most people should limit total sodium intake to less than 2,300 mg per day.  Always check the nutrition information of foods labeled as "low-fat" or "nonfat". These foods may be higher in added sugar or refined carbs and should be avoided.  Talk to your dietitian to identify your daily goals for nutrients listed on the label. Shopping  Avoid buying canned, premade, or processed foods. These foods tend to be high in fat, sodium, and added sugar.  Shop around the outside edge of the grocery store. This includes fresh fruits and vegetables, bulk grains, fresh meats, and fresh dairy. Cooking  Use low-heat cooking methods, such as baking, instead of high-heat cooking methods like deep frying.  Cook using healthy oils, such as olive, canola, or sunflower oil.  Avoid cooking with butter, cream, or high-fat meats. Meal planning  Eat meals and snacks regularly, preferably at the same times every day. Avoid going long periods of time without eating.  Eat foods high in fiber, such as fresh fruits, vegetables, beans, and whole grains. Talk to your dietitian about how many servings of carbs you can eat at each meal.  Eat 4-6 ounces (oz) of lean protein each day, such as lean meat, chicken, fish, eggs, or tofu. One oz of lean protein is equal to: ? 1 oz of meat, chicken, or fish. ? 1 egg. ?  cup of tofu.  Eat some foods each day that contain  healthy fats, such as avocado, nuts, seeds, and fish. Lifestyle  Check your blood glucose regularly.  Exercise regularly as told by your health care provider. This may include: ? 150 minutes of moderate-intensity or vigorous-intensity exercise each week. This could be brisk walking, biking, or water aerobics. ? Stretching and doing strength exercises, such as yoga or weightlifting, at least 2 times a week.  Take medicines as told by your health care provider.  Do not use any products that contain nicotine or tobacco, such as cigarettes and e-cigarettes. If you need help quitting, ask your health care provider.  Work with a counselor or diabetes educator to identify strategies to manage stress and any emotional and social challenges. Questions to ask a health care provider  Do I need to meet with a diabetes educator?  Do I need to meet with a dietitian?  What number can I call if I have questions?  When are the best times to   check my blood glucose? Where to find more information:  American Diabetes Association: diabetes.org  Academy of Nutrition and Dietetics: www.eatright.org  National Institute of Diabetes and Digestive and Kidney Diseases (NIH): www.niddk.nih.gov Summary  A healthy meal plan will help you control your blood glucose and maintain a healthy lifestyle.  Working with a diet and nutrition specialist (dietitian) can help you make a meal plan that is best for you.  Keep in mind that carbohydrates (carbs) and alcohol have immediate effects on your blood glucose levels. It is important to count carbs and to use alcohol carefully. This information is not intended to replace advice given to you by your health care provider. Make sure you discuss any questions you have with your health care provider. Document Released: 05/14/2005 Document Revised: 03/17/2017 Document Reviewed: 09/21/2016 Elsevier Interactive Patient Education  2019 [employer]  

## 2018-10-01 NOTE — Progress Notes (Signed)
Aaron [name] 68 y.o.   Chief Complaint  Patient presents with  . Dizziness    x 1 week.  Pt treated in office for earache x month ago -referred to [facility] ENT and tx with antifungal and told not related to dizziness.  Per pt has hx of vertigo.  No chest pain, nausea/vomiting or sweats.  Per pt very lightheaded right now and just wants to be checked out.   Triage nurse notes reviewed as follows: Pt called to say he has had dizziness for about 1 week. He says it is mild and he is able to work. He is at work today.  He is unsure what is causing this dizziness. He is seeing an ear specialist for an ear fungus treatment. That specialist denies that his condition would cause the dizziness. He is [employer] and chesks his BS's He says it is usually around 120. He takes Norvasc for BP but has not had his BP checked. Call placed to [facility] office per pt request. Pt is available for OV Saturday or Monday. Per Corbin Ade this triage will be routed to office and they will call pt with next available appointment. Care advice reviewed with patient.  Patient agrees to advice.  HISTORY OF PRESENT ILLNESS: This is a 68 y.o. male complaining of lightheadedness for 1 week.  No vertigo.  Patient has a history of hypertension and diabetes.  On no new medications.  Has been on amlodipine Lipitor and metformin for several years.  Went to see ENT doctor 1 week ago.  Told symptoms were not secondary to ear condition.  Bending over makes symptoms worse.  Denies rectal bleeding or melena.  Denies neurological symptoms.  No problems with balance or walking.  Denies headache or visual problems.  Denies nausea or vomiting.  No changes in his lifestyle.  Non-smoker.  Drinks 1 can of beer daily.  No occupational exposure.  Denies chest pain or difficulty breathing.  No episodes or diaphoresis or hypoglycemic events.  Denies any other significant symptoms.  HPI   Prior to Admission medications   Medication Sig Start Date End Date  Taking? Authorizing Provider  amLODipine (NORVASC) 5 MG tablet Take 1 tablet (5 mg total) by mouth daily. 05/04/18  Yes Jaynee Eagles, PA-C  atorvastatin (LIPITOR) 40 MG tablet Take 1 tablet (40 mg total) by mouth daily. 03/01/18  Yes Jaynee Eagles, PA-C  metFORMIN (GLUCOPHAGE-XR) 500 MG 24 hr tablet Take 2 tablets (1,000 mg total) by mouth daily with breakfast. 03/25/18  Yes Wardell Honour, MD  triamcinolone cream (KENALOG) 0.1 % Apply 1 application topically 2 (two) times daily. 03/25/18  Yes Wardell Honour, MD    Allergies  Allergen Reactions  . Erythromycin Itching  . Levaquin [Levofloxacin In D5w]     Light sensitivity     Patient Active Problem List   Diagnosis Date Noted  . Gastroesophageal reflux disease without esophagitis 03/13/2017  . Type 2 diabetes mellitus without complication, without long-term current use of insulin ([place]) 03/13/2017  . Functional diarrhea 03/13/2017    Past Medical History:  Diagnosis Date  . Diabetes mellitus without complication ([place])   . GERD (gastroesophageal reflux disease)   . Hyperlipidemia   . Hypertension     No past surgical history on file.  Social History   Socioeconomic History  . Marital status: Married    Spouse name: Not on file  . Number of children: 0  . Years of education: Not on file  . Highest education  level: Not on file  Occupational History  . Occupation: Press photographer  Social Needs  . Financial resource strain: Not on file  . Food insecurity:    Worry: Not on file    Inability: Not on file  . Transportation needs:    Medical: Not on file    Non-medical: Not on file  Tobacco Use  . Smoking status: Former Smoker    Types: Cigarettes  . Smokeless tobacco: Never Used  Substance and Sexual Activity  . Alcohol use: Yes    Alcohol/week: 0.0 standard drinks    Comment: 2-3 beers a week  . Drug use: No  . Sexual activity: Yes  Lifestyle  . Physical activity:    Days per week: Not on file    Minutes per session: Not on  file  . Stress: Not on file  Relationships  . Social connections:    Talks on phone: Not on file    Gets together: Not on file    Attends religious service: Not on file    Active member of club or organization: Not on file    Attends meetings of clubs or organizations: Not on file    Relationship status: Not on file  . Intimate partner violence:    Fear of current or ex partner: Not on file    Emotionally abused: Not on file    Physically abused: Not on file    Forced sexual activity: Not on file  Other Topics Concern  . Not on file  Social History Narrative   Marital status:  Married.       Children:   none      Lives:      Employment:  Press photographer      Tobacco:  none      Alcohol:  5-6 drinks per week      Drugs:   none      Exercise:      Seatbelt:      Education: [employer].    Family History  Problem Relation Age of Onset  . Cancer Mother        bone marrow cancer  . Cancer Father 32       brain cancer     Review of Systems  Constitutional: Negative.  Negative for chills, fever, malaise/fatigue and weight loss.  HENT: Negative.  Negative for congestion, ear pain, hearing loss, nosebleeds, sinus pain, sore throat and tinnitus.   Eyes: Negative.  Negative for blurred vision and double vision.  Respiratory: Negative.  Negative for cough and shortness of breath.   Cardiovascular: Negative.  Negative for chest pain and palpitations.  Gastrointestinal: Negative.  Negative for abdominal pain, blood in stool, diarrhea, melena, nausea and vomiting.  Genitourinary: Negative.  Negative for dysuria and hematuria.  Musculoskeletal: Negative.  Negative for back pain, myalgias and neck pain.  Skin: Negative.  Negative for rash.  Neurological: Positive for dizziness. Negative for sensory change, speech change, focal weakness, seizures, loss of consciousness and headaches.  Endo/Heme/Allergies: Negative.   All other systems reviewed and are negative.   Vitals:   10/01/18 0904    BP: 128/75  Pulse: 69  Resp: 16  Temp: 97.8 F (36.6 C)  SpO2: 98%    No orthostatic vital sign changes. Physical Exam Vitals signs reviewed.  Constitutional:      Appearance: Normal appearance.  HENT:     Head: Normocephalic and atraumatic.     Nose: Nose normal.     Mouth/Throat:  Mouth: Mucous membranes are moist.     Pharynx: Oropharynx is clear.  Eyes:     Extraocular Movements: Extraocular movements intact.     Conjunctiva/sclera: Conjunctivae normal.     Pupils: Pupils are equal, round, and reactive to light.  Neck:     Musculoskeletal: Normal range of motion and neck supple.  Cardiovascular:     Rate and Rhythm: Normal rate and regular rhythm.     Heart sounds: Normal heart sounds.  Pulmonary:     Effort: Pulmonary effort is normal.     Breath sounds: Normal breath sounds.  Abdominal:     General: Abdomen is flat. There is no distension.     Tenderness: There is no abdominal tenderness.  Musculoskeletal: Normal range of motion.  Skin:    General: Skin is warm and dry.     Capillary Refill: Capillary refill takes less than 2 seconds.  Neurological:     General: No focal deficit present.     Mental Status: He is alert and oriented to person, place, and time.     Cranial Nerves: No cranial nerve deficit.     Sensory: No sensory deficit.     Motor: No weakness.     Coordination: Coordination normal.     Gait: Gait normal.     Deep Tendon Reflexes: Reflexes normal.  Psychiatric:        Mood and Affect: Mood normal.        Behavior: Behavior normal.    EKG: Normal sinus rhythm with normal ventricular rate.  No acute ischemic changes. Recent Results (from the past 2160 hour(s))  POCT glycosylated hemoglobin (Hb A1C)     Status: Abnormal   Collection Time: 10/01/18  9:48 AM  Result Value Ref Range   Hemoglobin A1C 8.8 (A) 4.0 - 5.6 %   HbA1c POC (<> result, manual entry)     HbA1c, POC (prediabetic range)     HbA1c, POC (controlled diabetic range)     POCT glucose (manual entry)     Status: Abnormal   Collection Time: 10/01/18  9:52 AM  Result Value Ref Range   POC Glucose 213 (A) 70 - 99 mg/dl  POCT CBC     Status: Abnormal   Collection Time: 10/01/18  9:53 AM  Result Value Ref Range   WBC 7.4 4.6 - 10.2 K/uL   Lymph, poc 2.0 0.6 - 3.4   POC LYMPH PERCENT 27.1 10 - 50 %L   MID (cbc) 0.4 0 - 0.9   POC MID % 6.0 0 - 12 %M   POC Granulocyte 5.0 2 - 6.9   Granulocyte percent 66.9 37 - 80 %G   RBC 5.40 4.69 - 6.13 M/uL   Hemoglobin 15.0 (A) 11 - 14.6 g/dL   HCT, POC 44.9 (A) 29 - 41 %   MCV 83.1 76 - 111 fL   MCH, POC 27.8 27 - 31.2 pg   MCHC 33.4 31.8 - 35.4 g/dL   RDW, POC 14.0 %   Platelet Count, POC 280 142 - 424 K/uL   MPV 7.4 0 - 99.8 fL  Comprehensive metabolic panel     Status: Abnormal   Collection Time: 10/01/18 11:16 AM  Result Value Ref Range   Glucose 181 (H) 65 - 99 mg/dL   BUN 11 8 - 27 mg/dL   Creatinine, Ser 0.88 0.76 - 1.27 mg/dL   GFR calc non Af Amer 90 >59 mL/min/1.73   GFR calc Af Amer 104 >59 mL/min/1.73  BUN/Creatinine Ratio 13 10 - 24   Sodium 136 134 - 144 mmol/L   Potassium 4.9 3.5 - 5.2 mmol/L   Chloride 99 96 - 106 mmol/L   CO2 21 20 - 29 mmol/L   Calcium 9.2 8.6 - 10.2 mg/dL   Total Protein 6.9 6.0 - 8.5 g/dL   Albumin 4.4 3.8 - 4.8 g/dL    Comment:               **Please note reference interval change**   Globulin, Total 2.5 1.5 - 4.5 g/dL   Albumin/Globulin Ratio 1.8 1.2 - 2.2   Bilirubin Total 0.3 0.0 - 1.2 mg/dL   Alkaline Phosphatase 101 39 - 117 IU/L   AST 20 0 - 40 IU/L   ALT 27 0 - 44 IU/L  Lipid panel     Status: Abnormal   Collection Time: 10/01/18 11:16 AM  Result Value Ref Range   Cholesterol, Total 78 (L) 100 - 199 mg/dL   Triglycerides 62 0 - 149 mg/dL   HDL 35 (L) >39 mg/dL   VLDL Cholesterol Cal 12 5 - 40 mg/dL   LDL Calculated 31 0 - 99 mg/dL   Chol/HDL Ratio 2.2 0.0 - 5.0 ratio    Comment:                                   T. Chol/HDL Ratio                                              Men  Women                               1/2 Avg.Risk  3.4    3.3                                   Avg.Risk  5.0    4.4                                2X Avg.Risk  9.6    7.1                                3X Avg.Risk 23.4   11.0   A total of 40 minutes was spent in the room with the patient, greater than 50% of which was in counseling/coordination of care regarding differential diagnosis and uncontrolled diabetes, management and treatment, change of medications and new medication, diet and nutrition, exercise and physical activity, discussion of EKG and blood results, and need for follow-up.   ASSESSMENT & PLAN: Type 2 diabetes mellitus without complication, without long-term current use of insulin (HCC) Uncontrolled diabetes hemoglobin A1c today at 8.8 higher than previous ones.  Lightheadedness most likely secondary to this.  Patient taking metformin and Lipitor.  Will add Farxiga 5 mg daily.  Follow-up in 3 months.  Lightheadedness Most likely secondary to uncontrolled diabetes.  No red flag signs or symptoms.  Curtez was seen today for dizziness.  Diagnoses and all orders for this visit:  Lightheadedness -  Comprehensive metabolic panel -     EKG [id number] -     POCT glucose (manual entry) -     POCT CBC -     Orthostatic vital signs  Type 2 diabetes mellitus with hyperglycemia, without long-term current use of insulin (HCC) -     POCT glycosylated hemoglobin (Hb A1C) -     Lipid panel  Other orders -     dapagliflozin propanediol (FARXIGA) 5 MG TABS tablet; Take 5 mg by mouth daily.    Patient Instructions       If you have lab work done today you will be contacted with your lab results within the next 2 weeks.  If you have not heard from [place] then please contact us. The fastest way to get your results is to register for My Chart.   IF you received an x-ray today, you will receive an invoice from [facility]. Please contact [facility]  Radiology at [phone] with questions or concerns regarding your invoice.   IF you received labwork today, you will receive an invoice from [place]. Please contact [facility] at [phone] with questions or concerns regarding your invoice.   Our billing staff will not be able to assist you with questions regarding bills from these companies.  You will be contacted with the lab results as soon as they are available. The fastest way to get your results is to activate your My Chart account. Instructions are located on the last page of this paperwork. If you have not heard from [place] regarding the results in 2 weeks, please contact this office.     Diabetes Mellitus and Nutrition, Adult When you have diabetes (diabetes mellitus), it is very important to have healthy eating habits because your blood sugar (glucose) levels are greatly affected by what you eat and drink. Eating healthy foods in the appropriate amounts, at about the same times every day, can help you:  Control your blood glucose.  Lower your risk of heart disease.  Improve your blood pressure.  Reach or maintain a healthy weight. Every person with diabetes is different, and each person has different needs for a meal plan. Your health care provider may recommend that you work with a diet and nutrition specialist (dietitian) to make a meal plan that is best for you. Your meal plan may vary depending on factors such as:  The calories you need.  The medicines you take.  Your weight.  Your blood glucose, blood pressure, and cholesterol levels.  Your activity level.  Other health conditions you have, such as heart or kidney disease. How do carbohydrates affect me? Carbohydrates, also called carbs, affect your blood glucose level more than any other type of food. Eating carbs naturally raises the amount of glucose in your blood. Carb counting is a method for keeping track of how many carbs you eat. Counting carbs is important to  keep your blood glucose at a healthy level, especially if you use insulin or take certain oral diabetes medicines. It is important to know how many carbs you can safely have in each meal. This is different for every person. Your dietitian can help you calculate how many carbs you should have at each meal and for each snack. Foods that contain carbs include:  Bread, cereal, rice, pasta, and crackers.  Potatoes and corn.  Peas, beans, and lentils.  Milk and yogurt.  Fruit and juice.  Desserts, such as cakes, cookies, ice cream, and candy. How does alcohol affect me? Alcohol can  cause a sudden decrease in blood glucose (hypoglycemia), especially if you use insulin or take certain oral diabetes medicines. Hypoglycemia can be a life-threatening condition. Symptoms of hypoglycemia (sleepiness, dizziness, and confusion) are similar to symptoms of having too much alcohol. If your health care provider says that alcohol is safe for you, follow these guidelines:  Limit alcohol intake to no more than 1 drink per day for nonpregnant women and 2 drinks per day for men. One drink equals 12 oz of beer, 5 oz of wine, or 1 oz of hard liquor.  Do not drink on an empty stomach.  Keep yourself hydrated with water, diet soda, or unsweetened iced tea.  Keep in mind that regular soda, juice, and other mixers may contain a lot of sugar and must be counted as carbs. What are tips for following this plan?  Reading food labels  Start by checking the serving size on the "Nutrition Facts" label of packaged foods and drinks. The amount of calories, carbs, fats, and other nutrients listed on the label is based on one serving of the item. Many items contain more than one serving per package.  Check the total grams (g) of carbs in one serving. You can calculate the number of servings of carbs in one serving by dividing the total carbs by 15. For example, if a food has 30 g of total carbs, it would be equal to 2  servings of carbs.  Check the number of grams (g) of saturated and trans fats in one serving. Choose foods that have low or no amount of these fats.  Check the number of milligrams (mg) of salt (sodium) in one serving. Most people should limit total sodium intake to less than 2,300 mg per day.  Always check the nutrition information of foods labeled as "low-fat" or "nonfat". These foods may be higher in added sugar or refined carbs and should be avoided.  Talk to your dietitian to identify your daily goals for nutrients listed on the label. Shopping  Avoid buying canned, premade, or processed foods. These foods tend to be high in fat, sodium, and added sugar.  Shop around the outside edge of the grocery store. This includes fresh fruits and vegetables, bulk grains, fresh meats, and fresh dairy. Cooking  Use low-heat cooking methods, such as baking, instead of high-heat cooking methods like deep frying.  Cook using healthy oils, such as olive, canola, or sunflower oil.  Avoid cooking with butter, cream, or high-fat meats. Meal planning  Eat meals and snacks regularly, preferably at the same times every day. Avoid going long periods of time without eating.  Eat foods high in fiber, such as fresh fruits, vegetables, beans, and whole grains. Talk to your dietitian about how many servings of carbs you can eat at each meal.  Eat 4-6 ounces (oz) of lean protein each day, such as lean meat, chicken, fish, eggs, or tofu. One oz of lean protein is equal to: ? 1 oz of meat, chicken, or fish. ? 1 egg. ?  cup of tofu.  Eat some foods each day that contain healthy fats, such as avocado, nuts, seeds, and fish. Lifestyle  Check your blood glucose regularly.  Exercise regularly as told by your health care provider. This may include: ? 150 minutes of moderate-intensity or vigorous-intensity exercise each week. This could be brisk walking, biking, or water aerobics. ? Stretching and doing  strength exercises, such as yoga or weightlifting, at least 2 times a week.  Take medicines  as told by your health care provider.  Do not use any products that contain nicotine or tobacco, such as cigarettes and e-cigarettes. If you need help quitting, ask your health care provider.  Work with a Social worker or diabetes educator to identify strategies to manage stress and any emotional and social challenges. Questions to ask a health care provider  Do I need to meet with a diabetes educator?  Do I need to meet with a dietitian?  What number can I call if I have questions?  When are the best times to check my blood glucose? Where to find more information:  American Diabetes Association: diabetes.org  Academy of Nutrition and Dietetics: www.eatright.[employer] of Diabetes and Digestive and Kidney Diseases (NIH): [employer] Summary  A healthy meal plan will help you control your blood glucose and maintain a healthy lifestyle.  Working with a diet and nutrition specialist (dietitian) can help you make a meal plan that is best for you.  Keep in mind that carbohydrates (carbs) and alcohol have immediate effects on your blood glucose levels. It is important to count carbs and to use alcohol carefully. This information is not intended to replace advice given to you by your health care provider. Make sure you discuss any questions you have with your health care provider. Document Released: 05/14/2005 Document Revised: 03/17/2017 Document Reviewed: 09/21/2016 Elsevier Interactive Patient Education  2019 Elsevier Inc.      Agustina Caroli, MD [facility]

## 2018-10-02 ENCOUNTER — Encounter: Payer: Self-pay | Admitting: Emergency Medicine

## 2018-10-02 DIAGNOSIS — R42 Dizziness and giddiness: Secondary | ICD-10-CM | POA: Insufficient documentation

## 2018-10-02 LAB — LIPID PANEL
Chol/HDL Ratio: 2.2 ratio (ref 0.0–5.0)
Cholesterol, Total: 78 mg/dL — ABNORMAL LOW (ref 100–199)
HDL: 35 mg/dL — ABNORMAL LOW (ref >39)
LDL Calculated: 31 mg/dL (ref 0–99)
Triglycerides: 62 mg/dL (ref 0–149)
VLDL Cholesterol Cal: 12 mg/dL (ref 5–40)

## 2018-10-02 LAB — COMPREHENSIVE METABOLIC PANEL
ALT: 27 IU/L (ref 0–44)
AST: 20 IU/L (ref 0–40)
Albumin/Globulin Ratio: 1.8 (ref 1.2–2.2)
Albumin: 4.4 g/dL (ref 3.8–4.8)
Alkaline Phosphatase: 101 IU/L (ref 39–117)
BUN/Creatinine Ratio: 13 (ref 10–24)
BUN: 11 mg/dL (ref 8–27)
Bilirubin Total: 0.3 mg/dL (ref 0.0–1.2)
CO2: 21 mmol/L (ref 20–29)
Calcium: 9.2 mg/dL (ref 8.6–10.2)
Chloride: 99 mmol/L (ref 96–106)
Creatinine, Ser: 0.88 mg/dL (ref 0.76–1.27)
GFR calc Af Amer: 104 mL/min/{1.73_m2} (ref >59)
GFR calc non Af Amer: 90 mL/min/{1.73_m2} (ref >59)
Globulin, Total: 2.5 g/dL (ref 1.5–4.5)
Glucose: 181 mg/dL — ABNORMAL HIGH (ref 65–99)
Potassium: 4.9 mmol/L (ref 3.5–5.2)
Sodium: 136 mmol/L (ref 134–144)
Total Protein: 6.9 g/dL (ref 6.0–8.5)

## 2018-10-02 NOTE — Assessment & Plan Note (Signed)
Uncontrolled diabetes hemoglobin A1c today at 8.8 higher than previous ones.  Lightheadedness most likely secondary to this.  Patient taking metformin and Lipitor.  Will add Farxiga 5 mg daily.  Follow-up in 3 months.

## 2018-10-02 NOTE — Assessment & Plan Note (Signed)
Most likely secondary to uncontrolled diabetes.  No red flag signs or symptoms.

## 2018-10-03 ENCOUNTER — Other Ambulatory Visit: Payer: Self-pay | Admitting: Emergency Medicine

## 2018-10-03 ENCOUNTER — Encounter: Payer: Self-pay | Admitting: *Deleted

## 2018-10-03 MED ORDER — GLIPIZIDE 5 MG PO TABS: 5.0000 mg | ORAL_TABLET | Freq: Every day | ORAL | 1 refills | Status: DC

## 2018-10-03 NOTE — Telephone Encounter (Signed)
Will use glipizide instead 5 mg a day.  Prescription sent.  Thanks.

## 2018-10-10 DIAGNOSIS — H6242 Otitis externa in other diseases classified elsewhere, left ear: Secondary | ICD-10-CM | POA: Diagnosis not present

## 2018-10-10 DIAGNOSIS — B369 Superficial mycosis, unspecified: Secondary | ICD-10-CM | POA: Diagnosis not present

## 2018-10-25 ENCOUNTER — Telehealth: Payer: Self-pay | Admitting: Emergency Medicine

## 2018-10-25 NOTE — Telephone Encounter (Signed)
LVM for pt regarding their appt with Dr. Mitchel Honour on 5/1. Due to Dr. Mitchel Honour being out of the office, pt will need to be rescheduled. When pt calls back, please reschedule at their convenience. Thank you!

## 2018-11-07 ENCOUNTER — Ambulatory Visit (INDEPENDENT_AMBULATORY_CARE_PROVIDER_SITE_OTHER): Payer: Medicare Other

## 2018-11-07 ENCOUNTER — Other Ambulatory Visit: Payer: Self-pay

## 2018-11-07 ENCOUNTER — Encounter: Payer: Self-pay | Admitting: Emergency Medicine

## 2018-11-07 ENCOUNTER — Ambulatory Visit (INDEPENDENT_AMBULATORY_CARE_PROVIDER_SITE_OTHER): Payer: Medicare Other | Admitting: Emergency Medicine

## 2018-11-07 VITALS — BP 146/80 | HR 78 | Temp 97.5°F | Resp 18 | Ht 66.0 in | Wt 202.0 lb

## 2018-11-07 DIAGNOSIS — M545 Low back pain, unspecified: Secondary | ICD-10-CM

## 2018-11-07 DIAGNOSIS — Z8639 Personal history of other endocrine, nutritional and metabolic disease: Secondary | ICD-10-CM | POA: Insufficient documentation

## 2018-11-07 DIAGNOSIS — M48061 Spinal stenosis, lumbar region without neurogenic claudication: Secondary | ICD-10-CM | POA: Diagnosis not present

## 2018-11-07 DIAGNOSIS — M7918 Myalgia, other site: Secondary | ICD-10-CM | POA: Diagnosis not present

## 2018-11-07 DIAGNOSIS — M47812 Spondylosis without myelopathy or radiculopathy, cervical region: Secondary | ICD-10-CM | POA: Diagnosis not present

## 2018-11-07 LAB — POCT URINALYSIS DIP (MANUAL ENTRY)
Bilirubin, UA: NEGATIVE
Blood, UA: NEGATIVE
Glucose, UA: NEGATIVE mg/dL
Leukocytes, UA: NEGATIVE
Nitrite, UA: NEGATIVE
Protein Ur, POC: NEGATIVE mg/dL
Spec Grav, UA: 1.025 (ref 1.010–1.025)
Urobilinogen, UA: 0.2 E.U./dL
pH, UA: 6 (ref 5.0–8.0)

## 2018-11-07 MED ORDER — TRAMADOL HCL 50 MG PO TABS: 50.0000 mg | ORAL_TABLET | Freq: Three times a day (TID) | ORAL | 0 refills | Status: DC | PRN

## 2018-11-07 NOTE — Patient Instructions (Addendum)
   If you have lab work done today you will be contacted with your lab results within the next 2 weeks.  If you have not heard from us then please contact us. The fastest way to get your results is to register for My Chart.   IF you received an x-ray today, you will receive an invoice from [facility]. Please contact [facility] at [phone] with questions or concerns regarding your invoice.   IF you received labwork today, you will receive an invoice from [facility]. Please contact [facility] at [phone] with questions or concerns regarding your invoice.   Our billing staff will not be able to assist you with questions regarding bills from these companies.  You will be contacted with the lab results as soon as they are available. The fastest way to get your results is to activate your My Chart account. Instructions are located on the last page of this paperwork. If you have not heard from us regarding the results in 2 weeks, please contact this office.     Acute Back Pain, Adult Acute back pain is sudden and usually short-lived. It is often caused by an injury to the muscles and tissues in the back. The injury may result from:  A muscle or ligament getting overstretched or torn (strained). Ligaments are tissues that connect bones to each other. Lifting something improperly can cause a back strain.  Wear and tear (degeneration) of the spinal disks. Spinal disks are circular tissue that provides cushioning between the bones of the spine (vertebrae).  Twisting motions, such as while playing sports or doing yard work.  A hit to the back.  Arthritis. You may have a physical exam, lab tests, and imaging tests to find the cause of your pain. Acute back pain usually goes away with rest and home care. Follow these instructions at home: Managing pain, stiffness, and swelling  Take over-the-counter and prescription medicines only as told by your health care  provider.  Your health care provider may recommend applying ice during the first 24-48 hours after your pain starts. To do this: ? Put ice in a plastic bag. ? Place a towel between your skin and the bag. ? Leave the ice on for 20 minutes, 2-3 times a day.  If directed, apply heat to the affected area as often as told by your health care provider. Use the heat source that your health care provider recommends, such as a moist heat pack or a heating pad. ? Place a towel between your skin and the heat source. ? Leave the heat on for 20-30 minutes. ? Remove the heat if your skin turns bright red. This is especially important if you are unable to feel pain, heat, or cold. You have a greater risk of getting burned. Activity   Do not stay in bed. Staying in bed for more than 1-2 days can delay your recovery.  Sit up and stand up straight. Avoid leaning forward when you sit, or hunching over when you stand. ? If you work at a desk, sit close to it so you do not need to lean over. Keep your chin tucked in. Keep your neck drawn back, and keep your elbows bent at a right angle. Your arms should look like the letter "L." ? Sit high and close to the steering wheel when you drive. Add lower back (lumbar) support to your car seat, if needed.  Take short walks on even surfaces as soon as you are able. Try   to increase the length of time you walk each day.  Do not sit, drive, or stand in one place for more than 30 minutes at a time. Sitting or standing for long periods of time can put stress on your back.  Do not drive or use heavy machinery while taking prescription pain medicine.  Use proper lifting techniques. When you bend and lift, use positions that put less stress on your back: ? Bend your knees. ? Keep the load close to your body. ? Avoid twisting.  Exercise regularly as told by your health care provider. Exercising helps your back heal faster and helps prevent back injuries by keeping muscles  strong and flexible.  Work with a physical therapist to make a safe exercise program, as recommended by your health care provider. Do any exercises as told by your physical therapist. Lifestyle  Maintain a healthy weight. Extra weight puts stress on your back and makes it difficult to have good posture.  Avoid activities or situations that make you feel anxious or stressed. Stress and anxiety increase muscle tension and can make back pain worse. Learn ways to manage anxiety and stress, such as through exercise. General instructions  Sleep on a firm mattress in a comfortable position. Try lying on your side with your knees slightly bent. If you lie on your back, put a pillow under your knees.  Follow your treatment plan as told by your health care provider. This may include: ? Cognitive or behavioral therapy. ? Acupuncture or massage therapy. ? Meditation or yoga. Contact a health care provider if:  You have pain that is not relieved with rest or medicine.  You have increasing pain going down into your legs or buttocks.  Your pain does not improve after 2 weeks.  You have pain at night.  You lose weight without trying.  You have a fever or chills. Get help right away if:  You develop new bowel or bladder control problems.  You have unusual weakness or numbness in your arms or legs.  You develop nausea or vomiting.  You develop abdominal pain.  You feel faint. Summary  Acute back pain is sudden and usually short-lived.  Use proper lifting techniques. When you bend and lift, use positions that put less stress on your back.  Take over-the-counter and prescription medicines and apply heat or ice as directed by your health care provider. This information is not intended to replace advice given to you by your health care provider. Make sure you discuss any questions you have with your health care provider. Document Released: 08/17/2005 Document Revised: 03/24/2018 Document  Reviewed: 03/31/2017 Elsevier Interactive Patient Education  2019 Elsevier Inc.  

## 2018-11-07 NOTE — Progress Notes (Signed)
Aaron [name] 68 y.o.   Chief Complaint  Patient presents with  . Back Pain    right lower side x1week sharp pain constant but worse at night     HISTORY OF PRESENT ILLNESS: This is a 68 y.o. male complaining of right-sided lumbar pain.  No injuries.  Denies urinary symptoms.  Able to eat and drink.  Denies nausea or vomiting.  Denies fever or chills.  Diabetic, has some history of chronic back pain but this feels different.  Denies abdominal pain. Pain is sharp and constant no radiation no associated with any other significant symptomatology.  Worse with movement and better with rest and Tylenol.  HPI   Prior to Admission medications   Medication Sig Start Date End Date Taking? Authorizing Provider  amLODipine (NORVASC) 5 MG tablet Take 1 tablet (5 mg total) by mouth daily. 05/04/18  Yes Jaynee Eagles, PA-C  atorvastatin (LIPITOR) 40 MG tablet Take 1 tablet (40 mg total) by mouth daily. 03/01/18  Yes Jaynee Eagles, PA-C  dapagliflozin propanediol (FARXIGA) 5 MG TABS tablet Take 5 mg by mouth daily. 10/01/18 12/30/18 Yes Omarr Hann, Ines Bloomer, MD  glipiZIDE (GLUCOTROL) 5 MG tablet Take 1 tablet (5 mg total) by mouth daily before breakfast. 10/03/18 01/01/19 Yes Young Mulvey, Ines Bloomer, MD  metFORMIN (GLUCOPHAGE-XR) 500 MG 24 hr tablet Take 2 tablets (1,000 mg total) by mouth daily with breakfast. 03/25/18  Yes Wardell Honour, MD  triamcinolone cream (KENALOG) 0.1 % Apply 1 application topically 2 (two) times daily. 03/25/18  Yes Wardell Honour, MD    Allergies  Allergen Reactions  . Erythromycin Itching  . Levaquin [Levofloxacin In D5w]     Light sensitivity     Patient Active Problem List   Diagnosis Date Noted  . Lightheadedness 10/02/2018  . Gastroesophageal reflux disease without esophagitis 03/13/2017  . Type 2 diabetes mellitus without complication, without long-term current use of insulin ([place]) 03/13/2017  . Functional diarrhea 03/13/2017    Past Medical History:  Diagnosis Date    . Diabetes mellitus without complication ([place])   . GERD (gastroesophageal reflux disease)   . Hyperlipidemia   . Hypertension     No past surgical history on file.  Social History   Socioeconomic History  . Marital status: Married    Spouse name: Not on file  . Number of children: 0  . Years of education: Not on file  . Highest education level: Not on file  Occupational History  . Occupation: Press photographer  Social Needs  . Financial resource strain: Not on file  . Food insecurity:    Worry: Not on file    Inability: Not on file  . Transportation needs:    Medical: Not on file    Non-medical: Not on file  Tobacco Use  . Smoking status: Former Smoker    Types: Cigarettes  . Smokeless tobacco: Never Used  Substance and Sexual Activity  . Alcohol use: Yes    Alcohol/week: 0.0 standard drinks    Comment: 2-3 beers a week  . Drug use: No  . Sexual activity: Yes  Lifestyle  . Physical activity:    Days per week: Not on file    Minutes per session: Not on file  . Stress: Not on file  Relationships  . Social connections:    Talks on phone: Not on file    Gets together: Not on file    Attends religious service: Not on file    Active member of club or  organization: Not on file    Attends meetings of clubs or organizations: Not on file    Relationship status: Not on file  . Intimate partner violence:    Fear of current or ex partner: Not on file    Emotionally abused: Not on file    Physically abused: Not on file    Forced sexual activity: Not on file  Other Topics Concern  . Not on file  Social History Narrative   Marital status:  Married.       Children:   none      Lives:      Employment:  Press photographer      Tobacco:  none      Alcohol:  5-6 drinks per week      Drugs:   none      Exercise:      Seatbelt:      Education: [employer].    Family History  Problem Relation Age of Onset  . Cancer Mother        bone marrow cancer  . Cancer Father 61       brain cancer      Review of Systems  Constitutional: Negative.  Negative for chills and fever.  HENT: Negative.  Negative for nosebleeds.   Eyes: Negative.   Respiratory: Negative for cough and shortness of breath.   Cardiovascular: Negative.  Negative for chest pain, palpitations, claudication and leg swelling.  Gastrointestinal: Negative for abdominal pain, blood in stool, diarrhea, melena, nausea and vomiting.  Genitourinary: Negative.  Negative for dysuria, flank pain and hematuria.  Musculoskeletal: Positive for back pain.  Skin: Negative.   Neurological: Negative.  Negative for dizziness and headaches.  Endo/Heme/Allergies: Negative.   All other systems reviewed and are negative.   Vitals:   11/07/18 0814  BP: (!) 146/80  Pulse: 78  Resp: 18  Temp: (!) 97.5 F (36.4 C)  SpO2: 96%    Physical Exam Vitals signs reviewed.  Constitutional:      Appearance: Normal appearance.  HENT:     Head: Normocephalic and atraumatic.     Nose: Nose normal.     Mouth/Throat:     Mouth: Mucous membranes are moist.     Pharynx: Oropharynx is clear.  Eyes:     Extraocular Movements: Extraocular movements intact.     Conjunctiva/sclera: Conjunctivae normal.     Pupils: Pupils are equal, round, and reactive to light.  Neck:     Musculoskeletal: Normal range of motion and neck supple.  Cardiovascular:     Rate and Rhythm: Normal rate and regular rhythm.     Heart sounds: Normal heart sounds.  Pulmonary:     Effort: Pulmonary effort is normal.     Breath sounds: Normal breath sounds.  Abdominal:     General: Bowel sounds are normal. There is no distension.     Palpations: Abdomen is soft.     Tenderness: There is no abdominal tenderness.  Musculoskeletal:     Lumbar back: He exhibits decreased range of motion and tenderness. He exhibits no bony tenderness, no swelling, no edema, no spasm and normal pulse.       Back:  Skin:    General: Skin is warm.  Neurological:     General: No focal  deficit present.     Mental Status: He is alert and oriented to person, place, and time.     Sensory: No sensory deficit.     Motor: No weakness.  Coordination: Coordination normal.     Gait: Gait normal.     Deep Tendon Reflexes: Reflexes normal.  Psychiatric:        Mood and Affect: Mood normal.        Behavior: Behavior normal.     Results for orders placed or performed in visit on 11/07/18 (from the past 24 hour(s))  POCT urinalysis dipstick     Status: Abnormal   Collection Time: 11/07/18  8:45 AM  Result Value Ref Range   Color, UA yellow yellow   Clarity, UA cloudy (A) clear   Glucose, UA negative negative mg/dL   Bilirubin, UA negative negative   Ketones, POC UA small (15) (A) negative mg/dL   Spec Grav, UA 1.025 1.010 - 1.025   Blood, UA negative negative   pH, UA 6.0 5.0 - 8.0   Protein Ur, POC negative negative mg/dL   Urobilinogen, UA 0.2 0.2 or 1.0 E.U./dL   Nitrite, UA Negative Negative   Leukocytes, UA Negative Negative   Dg Lumbar Spine Complete  Result Date: 11/07/2018 CLINICAL DATA:  Lumbar spine pain. EXAM: LUMBAR SPINE - COMPLETE 4+ VIEW COMPARISON:  06/01/2007 FINDINGS: There is no evidence of lumbar spine fracture. Alignment is normal. Mild multilevel osteoarthritic changes with disc space narrowing, endplate sclerosis, small osteophytes formation. Posterior facet arthropathy in the lower lumbosacral spine. Calcific atherosclerotic disease of the aorta. IMPRESSION: Mild multilevel osteoarthritic changes of the lumbosacral spine. Electronically Signed   By: Fidela Salisbury M.D.   On: 11/07/2018 08:46   A total of 25 minutes was spent in the room with the patient, greater than 50% of which was in counseling/coordination of care regarding differential diagnosis, treatment, medication, side effects, x-ray review, prognosis, and need for follow-up if no better or worse.  ASSESSMENT & PLAN: Jarquez was seen today for back pain.  Diagnoses and all orders  for this visit:  Lumbar pain -     POCT urinalysis dipstick -     DG Lumbar Spine Complete; Future -     traMADol (ULTRAM) 50 MG tablet; Take 1 tablet (50 mg total) by mouth every 8 (eight) hours as needed for severe pain.  Musculoskeletal pain  History of diabetes mellitus    Patient Instructions       If you have lab work done today you will be contacted with your lab results within the next 2 weeks.  If you have not heard from [place] then please contact us. The fastest way to get your results is to register for My Chart.   IF you received an x-ray today, you will receive an invoice from [facility]. Please contact [facility] at [phone] with questions or concerns regarding your invoice.   IF you received labwork today, you will receive an invoice from [facility]. Please contact [facility] at [phone] with questions or concerns regarding your invoice.   Our billing staff will not be able to assist you with questions regarding bills from these companies.  You will be contacted with the lab results as soon as they are available. The fastest way to get your results is to activate your My Chart account. Instructions are located on the last page of this paperwork. If you have not heard from [place] regarding the results in 2 weeks, please contact this office.     Acute Back Pain, Adult Acute back pain is sudden and usually short-lived. It is often caused by an injury to the muscles and tissues in the back. The injury  may result from:  A muscle or ligament getting overstretched or torn (strained). Ligaments are tissues that connect bones to each other. Lifting something improperly can cause a back strain.  Wear and tear (degeneration) of the spinal disks. Spinal disks are circular tissue that provides cushioning between the bones of the spine (vertebrae).  Twisting motions, such as while playing sports or doing yard work.  A hit to the back.  Arthritis. You may  have a physical exam, lab tests, and imaging tests to find the cause of your pain. Acute back pain usually goes away with rest and home care. Follow these instructions at home: Managing pain, stiffness, and swelling  Take over-the-counter and prescription medicines only as told by your health care provider.  Your health care provider may recommend applying ice during the first 24-48 hours after your pain starts. To do this: ? Put ice in a plastic bag. ? Place a towel between your skin and the bag. ? Leave the ice on for 20 minutes, 2-3 times a day.  If directed, apply heat to the affected area as often as told by your health care provider. Use the heat source that your health care provider recommends, such as a moist heat pack or a heating pad. ? Place a towel between your skin and the heat source. ? Leave the heat on for 20-30 minutes. ? Remove the heat if your skin turns bright red. This is especially important if you are unable to feel pain, heat, or cold. You have a greater risk of getting burned. Activity   Do not stay in bed. Staying in bed for more than 1-2 days can delay your recovery.  Sit up and stand up straight. Avoid leaning forward when you sit, or hunching over when you stand. ? If you work at a desk, sit close to it so you do not need to lean over. Keep your chin tucked in. Keep your neck drawn back, and keep your elbows bent at a right angle. Your arms should look like the letter "L." ? Sit high and close to the steering wheel when you drive. Add lower back (lumbar) support to your car seat, if needed.  Take short walks on even surfaces as soon as you are able. Try to increase the length of time you walk each day.  Do not sit, drive, or stand in one place for more than 30 minutes at a time. Sitting or standing for long periods of time can put stress on your back.  Do not drive or use heavy machinery while taking prescription pain medicine.  Use proper lifting  techniques. When you bend and lift, use positions that put less stress on your back: ? [place] your knees. ? Keep the load close to your body. ? Avoid twisting.  Exercise regularly as told by your health care provider. Exercising helps your back heal faster and helps prevent back injuries by keeping muscles strong and flexible.  Work with a physical therapist to make a safe exercise program, as recommended by your health care provider. Do any exercises as told by your physical therapist. Lifestyle  Maintain a healthy weight. Extra weight puts stress on your back and makes it difficult to have good posture.  Avoid activities or situations that make you feel anxious or stressed. Stress and anxiety increase muscle tension and can make back pain worse. Learn ways to manage anxiety and stress, such as through exercise. General instructions  Sleep on a firm mattress in a  comfortable position. Try lying on your side with your knees slightly bent. If you lie on your back, put a pillow under your knees.  Follow your treatment plan as told by your health care provider. This may include: ? Cognitive or behavioral therapy. ? Acupuncture or massage therapy. ? Meditation or yoga. Contact a health care provider if:  You have pain that is not relieved with rest or medicine.  You have increasing pain going down into your legs or buttocks.  Your pain does not improve after 2 weeks.  You have pain at night.  You lose weight without trying.  You have a fever or chills. Get help right away if:  You develop new bowel or bladder control problems.  You have unusual weakness or numbness in your arms or legs.  You develop nausea or vomiting.  You develop abdominal pain.  You feel faint. Summary  Acute back pain is sudden and usually short-lived.  Use proper lifting techniques. When you bend and lift, use positions that put less stress on your back.  Take over-the-counter and prescription  medicines and apply heat or ice as directed by your health care provider. This information is not intended to replace advice given to you by your health care provider. Make sure you discuss any questions you have with your health care provider. Document Released: 08/17/2005 Document Revised: 03/24/2018 Document Reviewed: 03/31/2017 Elsevier Interactive Patient Education  2019 [employer] Inc.      Agustina Caroli, MD [facility]

## 2018-12-14 ENCOUNTER — Encounter: Payer: Self-pay | Admitting: Emergency Medicine

## 2018-12-15 ENCOUNTER — Other Ambulatory Visit: Payer: Self-pay | Admitting: *Deleted

## 2018-12-15 DIAGNOSIS — E119 Type 2 diabetes mellitus without complications: Secondary | ICD-10-CM

## 2018-12-15 MED ORDER — METFORMIN HCL ER 500 MG PO TB24: 1000.0000 mg | ORAL_TABLET | Freq: Every day | ORAL | 0 refills | Status: DC

## 2018-12-30 ENCOUNTER — Ambulatory Visit: Payer: Medicare Other | Admitting: Emergency Medicine

## 2019-01-03 ENCOUNTER — Ambulatory Visit (INDEPENDENT_AMBULATORY_CARE_PROVIDER_SITE_OTHER): Payer: Medicare Other | Admitting: Emergency Medicine

## 2019-01-03 ENCOUNTER — Other Ambulatory Visit: Payer: Self-pay

## 2019-01-03 ENCOUNTER — Encounter: Payer: Self-pay | Admitting: Emergency Medicine

## 2019-01-03 VITALS — BP 146/86 | HR 74 | Temp 98.0°F | Resp 16 | Ht 66.5 in | Wt 203.2 lb

## 2019-01-03 DIAGNOSIS — E1165 Type 2 diabetes mellitus with hyperglycemia: Secondary | ICD-10-CM | POA: Diagnosis not present

## 2019-01-03 DIAGNOSIS — L409 Psoriasis, unspecified: Secondary | ICD-10-CM

## 2019-01-03 DIAGNOSIS — E1159 Type 2 diabetes mellitus with other circulatory complications: Secondary | ICD-10-CM

## 2019-01-03 DIAGNOSIS — I10 Essential (primary) hypertension: Secondary | ICD-10-CM | POA: Diagnosis not present

## 2019-01-03 DIAGNOSIS — R21 Rash and other nonspecific skin eruption: Secondary | ICD-10-CM

## 2019-01-03 DIAGNOSIS — I152 Hypertension secondary to endocrine disorders: Secondary | ICD-10-CM

## 2019-01-03 LAB — POCT GLYCOSYLATED HEMOGLOBIN (HGB A1C): Hemoglobin A1C: 8.7 % — AB (ref 4.0–5.6)

## 2019-01-03 LAB — GLUCOSE, POCT (MANUAL RESULT ENTRY): POC Glucose: 243 mg/dl — AB (ref 70–99)

## 2019-01-03 MED ORDER — FLUOCINOLONE ACETONIDE 0.01 % EX CREA: TOPICAL_CREAM | Freq: Two times a day (BID) | CUTANEOUS | 3 refills | Status: DC

## 2019-01-03 NOTE — Patient Instructions (Addendum)
Increase metformin to 1000 mg twice a day.  Increase glipizide to 5 mg twice a day.  Both with food.   If you have lab work done today you will be contacted with your lab results within the next 2 weeks.  If you have not heard from [place] then please contact us. The fastest way to get your results is to register for My Chart.   IF you received an x-ray today, you will receive an invoice from [facility]. Please contact [facility] at [phone] with questions or concerns regarding your invoice.   IF you received labwork today, you will receive an invoice from [facility]. Please contact [facility] at [phone] with questions or concerns regarding your invoice.   Our billing staff will not be able to assist you with questions regarding bills from these companies.  You will be contacted with the lab results as soon as they are available. The fastest way to get your results is to activate your My Chart account. Instructions are located on the last page of this paperwork. If you have not heard from [place] regarding the results in 2 weeks, please contact this office.     Diabetes Mellitus and Nutrition, Adult When you have diabetes (diabetes mellitus), it is very important to have healthy eating habits because your blood sugar (glucose) levels are greatly affected by what you eat and drink. Eating healthy foods in the appropriate amounts, at about the same times every day, can help you:  Control your blood glucose.  Lower your risk of heart disease.  Improve your blood pressure.  Reach or maintain a healthy weight. Every person with diabetes is different, and each person has different needs for a meal plan. Your health care provider may recommend that you work with a diet and nutrition specialist (dietitian) to make a meal plan that is best for you. Your meal plan may vary depending on factors such as:  The calories you need.  The medicines you take.  Your weight.  Your blood  glucose, blood pressure, and cholesterol levels.  Your activity level.  Other health conditions you have, such as heart or kidney disease. How do carbohydrates affect me? Carbohydrates, also called carbs, affect your blood glucose level more than any other type of food. Eating carbs naturally raises the amount of glucose in your blood. Carb counting is a method for keeping track of how many carbs you eat. Counting carbs is important to keep your blood glucose at a healthy level, especially if you use insulin or take certain oral diabetes medicines. It is important to know how many carbs you can safely have in each meal. This is different for every person. Your dietitian can help you calculate how many carbs you should have at each meal and for each snack. Foods that contain carbs include:  Bread, cereal, rice, pasta, and crackers.  Potatoes and corn.  Peas, beans, and lentils.  Milk and yogurt.  Fruit and juice.  Desserts, such as cakes, cookies, ice cream, and candy. How does alcohol affect me? Alcohol can cause a sudden decrease in blood glucose (hypoglycemia), especially if you use insulin or take certain oral diabetes medicines. Hypoglycemia can be a life-threatening condition. Symptoms of hypoglycemia (sleepiness, dizziness, and confusion) are similar to symptoms of having too much alcohol. If your health care provider says that alcohol is safe for you, follow these guidelines:  Limit alcohol intake to no more than 1 drink per day for nonpregnant women and 2 drinks per  day for men. One drink equals 12 oz of beer, 5 oz of wine, or 1 oz of hard liquor.  Do not drink on an empty stomach.  Keep yourself hydrated with water, diet soda, or unsweetened iced tea.  Keep in mind that regular soda, juice, and other mixers may contain a lot of sugar and must be counted as carbs. What are tips for following this plan?  Reading food labels  Start by checking the serving size on the  "Nutrition Facts" label of packaged foods and drinks. The amount of calories, carbs, fats, and other nutrients listed on the label is based on one serving of the item. Many items contain more than one serving per package.  Check the total grams (g) of carbs in one serving. You can calculate the number of servings of carbs in one serving by dividing the total carbs by 15. For example, if a food has 30 g of total carbs, it would be equal to 2 servings of carbs.  Check the number of grams (g) of saturated and trans fats in one serving. Choose foods that have low or no amount of these fats.  Check the number of milligrams (mg) of salt (sodium) in one serving. Most people should limit total sodium intake to less than 2,300 mg per day.  Always check the nutrition information of foods labeled as "low-fat" or "nonfat". These foods may be higher in added sugar or refined carbs and should be avoided.  Talk to your dietitian to identify your daily goals for nutrients listed on the label. Shopping  Avoid buying canned, premade, or processed foods. These foods tend to be high in fat, sodium, and added sugar.  Shop around the outside edge of the grocery store. This includes fresh fruits and vegetables, bulk grains, fresh meats, and fresh dairy. Cooking  Use low-heat cooking methods, such as baking, instead of high-heat cooking methods like deep frying.  Cook using healthy oils, such as olive, canola, or sunflower oil.  Avoid cooking with butter, cream, or high-fat meats. Meal planning  Eat meals and snacks regularly, preferably at the same times every day. Avoid going long periods of time without eating.  Eat foods high in fiber, such as fresh fruits, vegetables, beans, and whole grains. Talk to your dietitian about how many servings of carbs you can eat at each meal.  Eat 4-6 ounces (oz) of lean protein each day, such as lean meat, chicken, fish, eggs, or tofu. One oz of lean protein is equal  to: ? 1 oz of meat, chicken, or fish. ? 1 egg. ?  cup of tofu.  Eat some foods each day that contain healthy fats, such as avocado, nuts, seeds, and fish. Lifestyle  Check your blood glucose regularly.  Exercise regularly as told by your health care provider. This may include: ? 150 minutes of moderate-intensity or vigorous-intensity exercise each week. This could be brisk walking, biking, or water aerobics. ? Stretching and doing strength exercises, such as yoga or weightlifting, at least 2 times a week.  Take medicines as told by your health care provider.  Do not use any products that contain nicotine or tobacco, such as cigarettes and e-cigarettes. If you need help quitting, ask your health care provider.  Work with a Social worker or diabetes educator to identify strategies to manage stress and any emotional and social challenges. Questions to ask a health care provider  Do I need to meet with a diabetes educator?  Do I need to  meet with a dietitian?  What number can I call if I have questions?  When are the best times to check my blood glucose? Where to find more information:  American Diabetes Association: diabetes.org  Academy of Nutrition and Dietetics: www.eatright.[employer] of Diabetes and Digestive and Kidney Diseases (NIH): [employer] Summary  A healthy meal plan will help you control your blood glucose and maintain a healthy lifestyle.  Working with a diet and nutrition specialist (dietitian) can help you make a meal plan that is best for you.  Keep in mind that carbohydrates (carbs) and alcohol have immediate effects on your blood glucose levels. It is important to count carbs and to use alcohol carefully. This information is not intended to replace advice given to you by your health care provider. Make sure you discuss any questions you have with your health care provider. Document Released: 05/14/2005 Document Revised: 03/17/2017 Document  Reviewed: 09/21/2016 Elsevier Interactive Patient Education  2019 [employer].

## 2019-01-03 NOTE — Assessment & Plan Note (Signed)
Uncontrolled diabetes with hemoglobin A1c at 8.7 today.  Will increase metformin to 1000 mg twice a day and increase glipizide to 5 mg twice a day.  Encouraged to exercise and follow diabetic diet.  Follow-up in 6 months.

## 2019-01-03 NOTE — Progress Notes (Signed)
Lab Results  Component Value Date   HGBA1C 8.8 (A) 10/01/2018   BP Readings from Last 3 Encounters:  11/07/18 (!) 146/80  10/01/18 128/75  08/13/18 (!) 145/78   Lab Results  Component Value Date   CHOL 78 (L) 10/01/2018   HDL 35 (L) 10/01/2018   LDLCALC 31 10/01/2018   TRIG 62 10/01/2018   CHOLHDL 2.2 10/01/2018   Lab Results  Component Value Date   CREATININE 0.88 10/01/2018   BUN 11 10/01/2018   NA 136 10/01/2018   K 4.9 10/01/2018   CL 99 10/01/2018   CO2 21 10/01/2018   Wt Readings from Last 3 Encounters:  11/07/18 202 lb (91.6 kg)  10/01/18 205 lb 9.6 oz (93.3 kg)  08/13/18 201 lb 3.2 oz (91.3 kg)   [name] 68 y.o.   Chief Complaint  Patient presents with  . Diabetes    follow up x 2 months    HISTORY OF PRESENT ILLNESS: This is a 68 y.o. male here for diabetes follow-up.  Taking metformin and glipizide.  Doing well.  Has no complaints or medical concerns today. Has been having some left-sided constant sharp pain to axillary area.  Denies injury.  Denies bruising.  Pain is steady and worse with movement.  Not associated with any other symptoms.  Denies difficulty breathing or anterior chest pain.  Denies fever or chills.  HPI   Prior to Admission medications   Medication Sig Start Date End Date Taking? Authorizing Provider  amLODipine (NORVASC) 5 MG tablet Take 1 tablet (5 mg total) by mouth daily. 05/04/18  Yes Jaynee Eagles, PA-C  atorvastatin (LIPITOR) 40 MG tablet Take 1 tablet (40 mg total) by mouth daily. 03/01/18  Yes Jaynee Eagles, PA-C  metFORMIN (GLUCOPHAGE-XR) 500 MG 24 hr tablet Take 2 tablets (1,000 mg total) by mouth daily with breakfast. 12/15/18  Yes Jaxsyn Azam, Ines Bloomer, MD  glipiZIDE (GLUCOTROL) 5 MG tablet Take 1 tablet (5 mg total) by mouth daily before breakfast. 10/03/18 01/01/19  [name], MD  traMADol (ULTRAM) 50 MG tablet Take 1 tablet (50 mg total) by mouth every 8 (eight) hours as needed for severe pain. Patient not  taking: Reported on 01/03/2019 11/07/18   [name], MD  triamcinolone cream (KENALOG) 0.1 % Apply 1 application topically 2 (two) times daily. Patient not taking: Reported on 01/03/2019 03/25/18   Wardell Honour, MD    Allergies  Allergen Reactions  . Erythromycin Itching  . Levaquin [Levofloxacin In D5w]     Light sensitivity     Patient Active Problem List   Diagnosis Date Noted  . Gastroesophageal reflux disease without esophagitis 03/13/2017  . Type 2 diabetes mellitus without complication, without long-term current use of insulin ([place]) 03/13/2017    Past Medical History:  Diagnosis Date  . Diabetes mellitus without complication ([place])   . GERD (gastroesophageal reflux disease)   . Hyperlipidemia   . Hypertension     History reviewed. No pertinent surgical history.  Social History   Socioeconomic History  . Marital status: Married    Spouse name: Not on file  . Number of children: 0  . Years of education: Not on file  . Highest education level: Not on file  Occupational History  . Occupation: Press photographer  Social Needs  . Financial resource strain: Not on file  . Food insecurity:    Worry: Not on file    Inability: Not on file  . Transportation needs:    Medical:  Not on file    Non-medical: Not on file  Tobacco Use  . Smoking status: Former Smoker    Types: Cigarettes  . Smokeless tobacco: Never Used  Substance and Sexual Activity  . Alcohol use: Yes    Alcohol/week: 0.0 standard drinks    Comment: 2-3 beers a week  . Drug use: No  . Sexual activity: Yes  Lifestyle  . Physical activity:    Days per week: Not on file    Minutes per session: Not on file  . Stress: Not on file  Relationships  . Social connections:    Talks on phone: Not on file    Gets together: Not on file    Attends religious service: Not on file    Active member of club or organization: Not on file    Attends meetings of clubs or organizations: Not on file    Relationship  status: Not on file  . Intimate partner violence:    Fear of current or ex partner: Not on file    Emotionally abused: Not on file    Physically abused: Not on file    Forced sexual activity: Not on file  Other Topics Concern  . Not on file  Social History Narrative   Marital status:  Married.       Children:   none      Lives:      Employment:  Press photographer      Tobacco:  none      Alcohol:  5-6 drinks per week      Drugs:   none      Exercise:      Seatbelt:      Education: [employer].    Family History  Problem Relation Age of Onset  . Cancer Mother        bone marrow cancer  . Cancer Father 43       brain cancer     Review of Systems  Constitutional: Negative.  Negative for chills and fever.  HENT: Negative for congestion and sore throat.   Eyes: Negative.  Negative for blurred vision and double vision.  Respiratory: Negative.  Negative for cough and shortness of breath.   Cardiovascular: Negative.  Negative for chest pain, palpitations, claudication and leg swelling.  Gastrointestinal: Negative.  Negative for abdominal pain, blood in stool, diarrhea, nausea and vomiting.  Genitourinary: Negative.   Musculoskeletal: Negative for myalgias.  Skin: Negative.  Negative for rash.  Neurological: Negative for dizziness and headaches.  All other systems reviewed and are negative.  Vitals:   01/03/19 0814  BP: (!) 146/86  Pulse: 74  Resp: 16  Temp: 98 F (36.7 C)  SpO2: 98%     Physical Exam Vitals signs reviewed.  Constitutional:      Appearance: Normal appearance.  HENT:     Head: Normocephalic and atraumatic.     Nose: Nose normal.     Mouth/Throat:     Mouth: Mucous membranes are moist.     Pharynx: Oropharynx is clear.  Eyes:     Extraocular Movements: Extraocular movements intact.     Conjunctiva/sclera: Conjunctivae normal.     Pupils: Pupils are equal, round, and reactive to light.  Neck:     Musculoskeletal: Normal range of motion and neck supple.   Cardiovascular:     Rate and Rhythm: Normal rate and regular rhythm.     Heart sounds: Normal heart sounds.  Pulmonary:     Effort: Pulmonary effort is  normal.     Breath sounds: Normal breath sounds.  Abdominal:     General: Abdomen is flat. There is no distension.     Tenderness: There is no abdominal tenderness.  Musculoskeletal: Normal range of motion.  Skin:    General: Skin is warm and dry.     Capillary Refill: Capillary refill takes less than 2 seconds.  Neurological:     General: No focal deficit present.     Mental Status: He is alert and oriented to person, place, and time.  Psychiatric:        Mood and Affect: Mood normal.        Behavior: Behavior normal.    Results for orders placed or performed in visit on 01/03/19 (from the past 24 hour(s))  POCT glucose (manual entry)     Status: Abnormal   Collection Time: 01/03/19  8:55 AM  Result Value Ref Range   POC Glucose 243 (A) 70 - 99 mg/dl  POCT glycosylated hemoglobin (Hb A1C)     Status: Abnormal   Collection Time: 01/03/19  9:04 AM  Result Value Ref Range   Hemoglobin A1C 8.7 (A) 4.0 - 5.6 %   HbA1c POC (<> result, manual entry)     HbA1c, POC (prediabetic range)     HbA1c, POC (controlled diabetic range)     A total of 25 minutes was spent in the room with the patient, greater than 50% of which was in counseling/coordination of care regarding diabetes and potential cardiovascular complications due to uncontrolled diabetes.  We talked about diet, nutrition, and physical activity.  We talked about changes in medication doses.  We also talked about prognosis and need for follow-up.   ASSESSMENT & PLAN: Type 2 diabetes mellitus with hyperglycemia, without long-term current use of insulin (HCC) Uncontrolled diabetes with hemoglobin A1c at 8.7 today.  Will increase metformin to 1000 mg twice a day and increase glipizide to 5 mg twice a day.  Encouraged to exercise and follow diabetic diet.  Follow-up in 6 months.   Aaron [name] was seen today for diabetes.  Diagnoses and all orders for this visit:  Type 2 diabetes mellitus with hyperglycemia, without long-term current use of insulin (HCC) -     POCT glucose (manual entry) -     POCT glycosylated hemoglobin (Hb A1C)  Rash and nonspecific skin eruption -     fluocinolone (VANOS) 0.01 % cream; Apply topically 2 (two) times daily.  Essential hypertension  Hypertension associated with type 2 diabetes mellitus ([place])  Psoriasis    Patient Instructions    Increase metformin to 1000 mg twice a day.  Increase glipizide to 5 mg twice a day.  Both with food.   If you have lab work done today you will be contacted with your lab results within the next 2 weeks.  If you have not heard from [place] then please contact us. The fastest way to get your results is to register for My Chart.   IF you received an x-ray today, you will receive an invoice from [facility]. Please contact [facility] at [phone] with questions or concerns regarding your invoice.   IF you received labwork today, you will receive an invoice from [place]. Please contact [facility] at [phone] with questions or concerns regarding your invoice.   Our billing staff will not be able to assist you with questions regarding bills from these companies.  You will be contacted with the lab results as soon as they are available. The  fastest way to get your results is to activate your My Chart account. Instructions are located on the last page of this paperwork. If you have not heard from [place] regarding the results in 2 weeks, please contact this office.     Diabetes Mellitus and Nutrition, Adult When you have diabetes (diabetes mellitus), it is very important to have healthy eating habits because your blood sugar (glucose) levels are greatly affected by what you eat and drink. Eating healthy foods in the appropriate amounts, at about the same times every day, can help you:   Control your blood glucose.  Lower your risk of heart disease.  Improve your blood pressure.  Reach or maintain a healthy weight. Every person with diabetes is different, and each person has different needs for a meal plan. Your health care provider may recommend that you work with a diet and nutrition specialist (dietitian) to make a meal plan that is best for you. Your meal plan may vary depending on factors such as:  The calories you need.  The medicines you take.  Your weight.  Your blood glucose, blood pressure, and cholesterol levels.  Your activity level.  Other health conditions you have, such as heart or kidney disease. How do carbohydrates affect me? Carbohydrates, also called carbs, affect your blood glucose level more than any other type of food. Eating carbs naturally raises the amount of glucose in your blood. Carb counting is a method for keeping track of how many carbs you eat. Counting carbs is important to keep your blood glucose at a healthy level, especially if you use insulin or take certain oral diabetes medicines. It is important to know how many carbs you can safely have in each meal. This is different for every person. Your dietitian can help you calculate how many carbs you should have at each meal and for each snack. Foods that contain carbs include:  Bread, cereal, rice, pasta, and crackers.  Potatoes and corn.  Peas, beans, and lentils.  Milk and yogurt.  Fruit and juice.  Desserts, such as cakes, cookies, ice cream, and candy. How does alcohol affect me? Alcohol can cause a sudden decrease in blood glucose (hypoglycemia), especially if you use insulin or take certain oral diabetes medicines. Hypoglycemia can be a life-threatening condition. Symptoms of hypoglycemia (sleepiness, dizziness, and confusion) are similar to symptoms of having too much alcohol. If your health care provider says that alcohol is safe for you, follow these guidelines:   Limit alcohol intake to no more than 1 drink per day for nonpregnant women and 2 drinks per day for men. One drink equals 12 oz of beer, 5 oz of wine, or 1 oz of hard liquor.  Do not drink on an empty stomach.  Keep yourself hydrated with water, diet soda, or unsweetened iced tea.  Keep in mind that regular soda, juice, and other mixers may contain a lot of sugar and must be counted as carbs. What are tips for following this plan?  Reading food labels  Start by checking the serving size on the "Nutrition Facts" label of packaged foods and drinks. The amount of calories, carbs, fats, and other nutrients listed on the label is based on one serving of the item. Many items contain more than one serving per package.  Check the total grams (g) of carbs in one serving. You can calculate the number of servings of carbs in one serving by dividing the total carbs by 15. For example, if a food has  30 g of total carbs, it would be equal to 2 servings of carbs.  Check the number of grams (g) of saturated and trans fats in one serving. Choose foods that have low or no amount of these fats.  Check the number of milligrams (mg) of salt (sodium) in one serving. Most people should limit total sodium intake to less than 2,300 mg per day.  Always check the nutrition information of foods labeled as "low-fat" or "nonfat". These foods may be higher in added sugar or refined carbs and should be avoided.  Talk to your dietitian to identify your daily goals for nutrients listed on the label. Shopping  Avoid buying canned, premade, or processed foods. These foods tend to be high in fat, sodium, and added sugar.  Shop around the outside edge of the grocery store. This includes fresh fruits and vegetables, bulk grains, fresh meats, and fresh dairy. Cooking  Use low-heat cooking methods, such as baking, instead of high-heat cooking methods like deep frying.  Cook using healthy oils, such as olive, canola, or  sunflower oil.  Avoid cooking with butter, cream, or high-fat meats. Meal planning  Eat meals and snacks regularly, preferably at the same times every day. Avoid going long periods of time without eating.  Eat foods high in fiber, such as fresh fruits, vegetables, beans, and whole grains. Talk to your dietitian about how many servings of carbs you can eat at each meal.  Eat 4-6 ounces (oz) of lean protein each day, such as lean meat, chicken, fish, eggs, or tofu. One oz of lean protein is equal to: ? 1 oz of meat, chicken, or fish. ? 1 egg. ?  cup of tofu.  Eat some foods each day that contain healthy fats, such as avocado, nuts, seeds, and fish. Lifestyle  Check your blood glucose regularly.  Exercise regularly as told by your health care provider. This may include: ? 150 minutes of moderate-intensity or vigorous-intensity exercise each week. This could be brisk walking, biking, or water aerobics. ? Stretching and doing strength exercises, such as yoga or weightlifting, at least 2 times a week.  Take medicines as told by your health care provider.  Do not use any products that contain nicotine or tobacco, such as cigarettes and e-cigarettes. If you need help quitting, ask your health care provider.  Work with a Social worker or diabetes educator to identify strategies to manage stress and any emotional and social challenges. Questions to ask a health care provider  Do I need to meet with a diabetes educator?  Do I need to meet with a dietitian?  What number can I call if I have questions?  When are the best times to check my blood glucose? Where to find more information:  American Diabetes Association: diabetes.org  Academy of Nutrition and Dietetics: www.eatright.[employer] of Diabetes and Digestive and Kidney Diseases (NIH): [employer] Summary  A healthy meal plan will help you control your blood glucose and maintain a healthy lifestyle.  Working  with a diet and nutrition specialist (dietitian) can help you make a meal plan that is best for you.  Keep in mind that carbohydrates (carbs) and alcohol have immediate effects on your blood glucose levels. It is important to count carbs and to use alcohol carefully. This information is not intended to replace advice given to you by your health care provider. Make sure you discuss any questions you have with your health care provider. Document Released: 05/14/2005 Document Revised: 03/17/2017 Document  Reviewed: 09/21/2016 Elsevier Interactive Patient Education  2019 Elsevier Inc.      Agustina Caroli, MD [facility]

## 2019-01-07 ENCOUNTER — Other Ambulatory Visit: Payer: Self-pay | Admitting: Emergency Medicine

## 2019-02-16 ENCOUNTER — Telehealth (INDEPENDENT_AMBULATORY_CARE_PROVIDER_SITE_OTHER): Payer: Self-pay | Admitting: Family Medicine

## 2019-02-16 ENCOUNTER — Other Ambulatory Visit: Payer: Medicare Other

## 2019-02-16 ENCOUNTER — Ambulatory Visit (INDEPENDENT_AMBULATORY_CARE_PROVIDER_SITE_OTHER): Payer: Medicare Other | Admitting: Family Medicine

## 2019-02-16 ENCOUNTER — Telehealth: Payer: Self-pay | Admitting: General Practice

## 2019-02-16 ENCOUNTER — Other Ambulatory Visit: Payer: Self-pay

## 2019-02-16 ENCOUNTER — Encounter: Payer: Self-pay | Admitting: Family Medicine

## 2019-02-16 VITALS — BP 140/76 | HR 81 | Temp 98.4°F | Resp 14 | Wt 198.4 lb

## 2019-02-16 DIAGNOSIS — Z20822 Contact with and (suspected) exposure to covid-19: Secondary | ICD-10-CM

## 2019-02-16 DIAGNOSIS — E119 Type 2 diabetes mellitus without complications: Secondary | ICD-10-CM

## 2019-02-16 DIAGNOSIS — G44201 Tension-type headache, unspecified, intractable: Secondary | ICD-10-CM

## 2019-02-16 DIAGNOSIS — R6889 Other general symptoms and signs: Secondary | ICD-10-CM | POA: Diagnosis not present

## 2019-02-16 MED ORDER — CELECOXIB 100 MG PO CAPS: 100.0000 mg | ORAL_CAPSULE | Freq: Two times a day (BID) | ORAL | 0 refills | Status: DC

## 2019-02-16 NOTE — Patient Instructions (Addendum)
   Take celebrex twice a day for headache Drink plenty of water Work note for today through Monday Testing center will call on time for COVID test We will call on your lab results If headache worsens or additional symptoms occur, go to ER for further evaluation!   If you have lab work done today you will be contacted with your lab results within the next 2 weeks.  If you have not heard from [place] then please contact us. The fastest way to get your results is to register for My Chart.   IF you received an x-ray today, you will receive an invoice from [facility]. Please contact [facility] at [phone] with questions or concerns regarding your invoice.   IF you received labwork today, you will receive an invoice from [place]. Please contact [facility] at [phone] with questions or concerns regarding your invoice.   Our billing staff will not be able to assist you with questions regarding bills from these companies.  You will be contacted with the lab results as soon as they are available. The fastest way to get your results is to activate your My Chart account. Instructions are located on the last page of this paperwork. If you have not heard from [place] regarding the results in 2 weeks, please contact this office.

## 2019-02-16 NOTE — Telephone Encounter (Signed)
Pt has been scheduled for covid testing.  Scheduled pt with spouse, pt was away at the pharmacy.

## 2019-02-16 NOTE — Progress Notes (Signed)
Acute Office Visit  Subjective:    Patient ID: Aaron [name], male    DOB: [birth year], 68 y.o.   MRN: [id number]  Chief Complaint  Patient presents with  . Headache    Patient stated he was presented with a headache 02/10/19 pain is in the top of head down to the temples on both sides. Motrin has not helpped    HPI Patient is in today for headache that has remained constant all week. Pt states he wakes with headache and headache has woken him from sleep. Scale 5/10-parietal  Distribution bilat. No h/o headaches. No cough or fever  Past Medical History:  Diagnosis Date  . Diabetes mellitus without complication ([place])   . GERD (gastroesophageal reflux disease)   . Hyperlipidemia   . Hypertension     No past surgical history on file.  Family History  Problem Relation Age of Onset  . Cancer Mother        bone marrow cancer  . Cancer Father 30       brain cancer    Social History   Socioeconomic History  . Marital status: Married    Spouse name: Not on file  . Number of children: 0  . Years of education: Not on file  . Highest education level: Not on file  Occupational History  . Occupation: Press photographer  Social Needs  . Financial resource strain: Not on file  . Food insecurity    Worry: Not on file    Inability: Not on file  . Transportation needs    Medical: Not on file    Non-medical: Not on file  Tobacco Use  . Smoking status: Former Smoker    Types: Cigarettes  . Smokeless tobacco: Never Used  Substance and Sexual Activity  . Alcohol use: Yes    Alcohol/week: 0.0 standard drinks    Comment: 2-3 beers a week  . Drug use: No  . Sexual activity: Yes  Lifestyle  . Physical activity    Days per week: Not on file    Minutes per session: Not on file  . Stress: Not on file  Relationships  . Social Herbalist on phone: Not on file    Gets together: Not on file    Attends religious service: Not on file    Active member of club or organization: Not on  file    Attends meetings of clubs or organizations: Not on file    Relationship status: Not on file  . Intimate partner violence    Fear of current or ex partner: Not on file    Emotionally abused: Not on file    Physically abused: Not on file    Forced sexual activity: Not on file  Other Topics Concern  . Not on file  Social History Narrative   Marital status:  Married.       Children:   none      Lives:      Employment:  Press photographer      Tobacco:  none      Alcohol:  5-6 drinks per week      Drugs:   none      Exercise:      Seatbelt:      Education: [employer].    Outpatient Medications Prior to Visit  Medication Sig Dispense Refill  . amLODipine (NORVASC) 5 MG tablet Take 1 tablet (5 mg total) by mouth daily. 90 tablet 3  . atorvastatin (LIPITOR) 40  MG tablet Take 1 tablet (40 mg total) by mouth daily. 90 tablet 3  . fluocinolone (VANOS) 0.01 % cream Apply topically 2 (two) times daily. 30 g 3  . glipiZIDE (GLUCOTROL) 5 MG tablet TAKE 1 TABLET(5 MG) BY MOUTH DAILY BEFORE BREAKFAST 90 tablet 1  . metFORMIN (GLUCOPHAGE-XR) 500 MG 24 hr tablet Take 2 tablets (1,000 mg total) by mouth daily with breakfast. 180 tablet 0  . traMADol (ULTRAM) 50 MG tablet Take 1 tablet (50 mg total) by mouth every 8 (eight) hours as needed for severe pain. (Patient not taking: Reported on 01/03/2019) 20 tablet 0  . triamcinolone cream (KENALOG) 0.1 % Apply 1 application topically 2 (two) times daily. (Patient not taking: Reported on 01/03/2019) 30 g 0   No facility-administered medications prior to visit.     Allergies  Allergen Reactions  . Erythromycin Itching  . Levaquin [Levofloxacin In D5w]     Light sensitivity     Review of Systems  Constitutional: Positive for malaise/fatigue.  HENT: Negative for congestion and nosebleeds.   Gastrointestinal: Positive for diarrhea.  Genitourinary: Negative.   Musculoskeletal: Negative.   Neurological: Positive for headaches.  Psychiatric/Behavioral:  Negative.        Objective:    Physical Exam  Constitutional: He appears well-developed and well-nourished. He appears distressed.  HENT:  Head: Normocephalic and atraumatic.  Right Ear: External ear normal.  Left Ear: External ear normal.  Nose: Nose normal.  Mouth/Throat: Oropharynx is clear and moist.  Eyes: Conjunctivae are normal.  Cardiovascular: Normal rate and normal heart sounds.    BP 140/76 (BP Location: Right Arm, Patient Position: Sitting, Cuff Size: Normal)   Pulse 81   Temp 98.4 F (36.9 C) (Oral)   Resp 14   Wt 198 lb 6.4 oz (90 kg)   SpO2 97%   BMI 31.54 kg/m  Wt Readings from Last 3 Encounters:  02/16/19 198 lb 6.4 oz (90 kg)  01/03/19 203 lb 3.2 oz (92.2 kg)  11/07/18 202 lb (91.6 kg)    Health Maintenance Due  Topic Date Due  . URINE MICROALBUMIN  03/26/2019      Lab Results  Component Value Date   TSH 2.130 03/01/2018   Lab Results  Component Value Date   WBC 7.4 10/01/2018   HGB 15.0 (A) 10/01/2018   HCT 44.9 (A) 10/01/2018   MCV 83.1 10/01/2018   PLT 274 03/25/2018   Lab Results  Component Value Date   NA 136 10/01/2018   K 4.9 10/01/2018   CO2 21 10/01/2018   GLUCOSE 181 (H) 10/01/2018   BUN 11 10/01/2018   CREATININE 0.88 10/01/2018   BILITOT 0.3 10/01/2018   ALKPHOS 101 10/01/2018   AST 20 10/01/2018   ALT 27 10/01/2018   PROT 6.9 10/01/2018   ALBUMIN 4.4 10/01/2018   CALCIUM 9.2 10/01/2018   Lab Results  Component Value Date   CHOL 78 (L) 10/01/2018   Lab Results  Component Value Date   HDL 35 (L) 10/01/2018   Lab Results  Component Value Date   LDLCALC 31 10/01/2018   Lab Results  Component Value Date   TRIG 62 10/01/2018   Lab Results  Component Value Date   CHOLHDL 2.2 10/01/2018   Lab Results  Component Value Date   HGBA1C 8.7 (A) 01/03/2019       Assessment & Plan:    1. Type 2 diabetes mellitus without complication, without long-term current use of insulin (HCC) No medication changes -  CBC with Differential - CMP14+EGFR - TSH  2. Acute intractable tension-type headache celebrex-rxr-risk/benefit  - CBC with Differential - CMP14+EGFR - TSH D/w pt recommend COVID testing-pt with headache and fatigue with acute onset this week  LISA Hannah Beat, MD

## 2019-02-16 NOTE — Addendum Note (Signed)
Addended by: Dimple Nanas on: 02/16/2019 01:26 PM   Modules accepted: Orders

## 2019-02-16 NOTE — Telephone Encounter (Signed)
Headache with fatigue-new onset on Friday

## 2019-02-16 NOTE — Telephone Encounter (Signed)
Pt has been scheduled for covid testing.  Scheduled pt with spouse, pt was away at the pharmacy.   Pt is scheduled at [facility] testing site Pt was referred by: Maryruth Hancock, MD

## 2019-02-17 LAB — CBC WITH DIFFERENTIAL/PLATELET
Basophils Absolute: 0 10*3/uL (ref 0.0–0.2)
Basos: 0 %
EOS (ABSOLUTE): 0 10*3/uL (ref 0.0–0.4)
Eos: 0 %
Hematocrit: 51 % (ref 37.5–51.0)
Hemoglobin: 16.3 g/dL (ref 13.0–17.7)
Immature Grans (Abs): 0 10*3/uL (ref 0.0–0.1)
Immature Granulocytes: 0 %
Lymphocytes Absolute: 1.7 10*3/uL (ref 0.7–3.1)
Lymphs: 20 %
MCH: 27.2 pg (ref 26.6–33.0)
MCHC: 32 g/dL (ref 31.5–35.7)
MCV: 85 fL (ref 79–97)
Monocytes Absolute: 0.5 10*3/uL (ref 0.1–0.9)
Monocytes: 6 %
Neutrophils Absolute: 5.9 10*3/uL (ref 1.4–7.0)
Neutrophils: 74 %
Platelets: 323 10*3/uL (ref 150–450)
RBC: 6 x10E6/uL — ABNORMAL HIGH (ref 4.14–5.80)
RDW: 13.2 % (ref 11.6–15.4)
WBC: 8.2 10*3/uL (ref 3.4–10.8)

## 2019-02-17 LAB — CMP14+EGFR
ALT: 34 IU/L (ref 0–44)
AST: 23 IU/L (ref 0–40)
Albumin/Globulin Ratio: 1.8 (ref 1.2–2.2)
Albumin: 4.8 g/dL (ref 3.8–4.8)
Alkaline Phosphatase: 103 IU/L (ref 39–117)
BUN/Creatinine Ratio: 15 (ref 10–24)
BUN: 12 mg/dL (ref 8–27)
Bilirubin Total: 0.5 mg/dL (ref 0.0–1.2)
CO2: 21 mmol/L (ref 20–29)
Calcium: 9.7 mg/dL (ref 8.6–10.2)
Chloride: 101 mmol/L (ref 96–106)
Creatinine, Ser: 0.82 mg/dL (ref 0.76–1.27)
GFR calc Af Amer: 107 mL/min/{1.73_m2} (ref >59)
GFR calc non Af Amer: 92 mL/min/{1.73_m2} (ref >59)
Globulin, Total: 2.7 g/dL (ref 1.5–4.5)
Glucose: 210 mg/dL — ABNORMAL HIGH (ref 65–99)
Potassium: 4.6 mmol/L (ref 3.5–5.2)
Sodium: 137 mmol/L (ref 134–144)
Total Protein: 7.5 g/dL (ref 6.0–8.5)

## 2019-02-17 LAB — TSH: TSH: 2.01 u[IU]/mL (ref 0.450–4.500)

## 2019-02-18 LAB — NOVEL CORONAVIRUS, NAA: SARS-CoV-2, NAA: NOT DETECTED

## 2019-02-20 ENCOUNTER — Telehealth: Payer: Self-pay | Admitting: Emergency Medicine

## 2019-02-20 ENCOUNTER — Encounter: Payer: Self-pay | Admitting: Emergency Medicine

## 2019-02-20 NOTE — Telephone Encounter (Signed)
Copied from [place] [id number]. Topic: General - Inquiry >> Feb 20, 2019 12:49 PM Richardo Priest, NT wrote: Reason for CRM: Patient is requesting someone give him a call back to discuss lab results with him. Please advise.

## 2019-03-09 ENCOUNTER — Encounter: Payer: Self-pay | Admitting: Emergency Medicine

## 2019-03-10 ENCOUNTER — Other Ambulatory Visit: Payer: Self-pay | Admitting: Emergency Medicine

## 2019-03-10 DIAGNOSIS — E119 Type 2 diabetes mellitus without complications: Secondary | ICD-10-CM

## 2019-03-13 ENCOUNTER — Encounter: Payer: Self-pay | Admitting: Emergency Medicine

## 2019-03-14 ENCOUNTER — Encounter: Payer: Self-pay | Admitting: Emergency Medicine

## 2019-03-16 ENCOUNTER — Encounter: Payer: Self-pay | Admitting: Emergency Medicine

## 2019-03-16 ENCOUNTER — Other Ambulatory Visit: Payer: Self-pay

## 2019-03-16 ENCOUNTER — Telehealth (INDEPENDENT_AMBULATORY_CARE_PROVIDER_SITE_OTHER): Payer: Medicare Other | Admitting: Emergency Medicine

## 2019-03-16 DIAGNOSIS — G44201 Tension-type headache, unspecified, intractable: Secondary | ICD-10-CM

## 2019-03-16 MED ORDER — BUTALBITAL-ASPIRIN-CAFFEINE 50-325-40 MG PO CAPS: 1.0000 | ORAL_CAPSULE | Freq: Four times a day (QID) | ORAL | 0 refills | Status: DC | PRN

## 2019-03-16 NOTE — Progress Notes (Signed)
Pt stated that he has been having headaches is not going away. Going for 2 weeks. Celebrex eased the headache but it has not gone away.

## 2019-03-16 NOTE — Patient Instructions (Signed)
° ° ° °  If you have lab work done today you will be contacted with your lab results within the next 2 weeks.  If you have not heard from us then please contact us. The fastest way to get your results is to register for My Chart. ° ° °IF you received an x-ray today, you will receive an invoice from [facility]. Please contact [facility] at [phone] with questions or concerns regarding your invoice.  ° °IF you received labwork today, you will receive an invoice from [facility]. Please contact [facility] at [phone] with questions or concerns regarding your invoice.  ° °Our billing staff will not be able to assist you with questions regarding bills from these companies. ° °You will be contacted with the lab results as soon as they are available. The fastest way to get your results is to activate your My Chart account. Instructions are located on the last page of this paperwork. If you have not heard from us regarding the results in 2 weeks, please contact this office. °  ° ° ° °

## 2019-03-16 NOTE — Progress Notes (Signed)
Telemedicine Encounter- SOAP NOTE Established Patient  This telephone encounter was conducted with the patient's (or proxy's) verbal consent via audio telecommunications: yes/no: Yes Patient was instructed to have this encounter in a suitably private space; and to only have persons present to whom they give permission to participate. In addition, patient identity was confirmed by use of name plus two identifiers (DOB and address).  I discussed the limitations, risks, security and privacy concerns of performing an evaluation and management service by telephone and the availability of in person appointments. I also discussed with the patient that there may be a patient responsible charge related to this service. The patient expressed understanding and agreed to proceed.  I spent a total of TIME; 0 MIN TO 60 MIN: 15 minutes talking with the patient or their proxy.  No chief complaint on file. Persistent headache  Subjective   Aaron [name] is a 68 y.o. male established patient. Telephone visit today complaining of persistent headache for the last 2 to 3 weeks.  Was seen in the office recently and started on Celebrex.  Headache gets better but it keeps coming back when the effect wears off.  It is a squeezing pressure pain not associated with any other symptoms.  Presently at work.  Headache does not interfere with regular daily activities.  No history of migraine headaches but he has had similar tension type headaches in the past.  No other significant symptoms.  Denies flulike symptoms.  Recently tested negative for coronavirus.  HPI   Patient Active Problem List   Diagnosis Date Noted  . Acute intractable tension-type headache 02/16/2019  . Gastroesophageal reflux disease without esophagitis 03/13/2017  . Type 2 diabetes mellitus without complication, without long-term current use of insulin ([place]) 03/13/2017    Past Medical History:  Diagnosis Date  . Diabetes mellitus without  complication ([place])   . GERD (gastroesophageal reflux disease)   . Hyperlipidemia   . Hypertension     Current Outpatient Medications  Medication Sig Dispense Refill  . amLODipine (NORVASC) 5 MG tablet Take 1 tablet (5 mg total) by mouth daily. 90 tablet 3  . atorvastatin (LIPITOR) 40 MG tablet Take 1 tablet (40 mg total) by mouth daily. 90 tablet 3  . fluocinolone (VANOS) 0.01 % cream Apply topically 2 (two) times daily. 30 g 3  . glipiZIDE (GLUCOTROL) 5 MG tablet TAKE 1 TABLET(5 MG) BY MOUTH DAILY BEFORE BREAKFAST 90 tablet 1  . metFORMIN (GLUCOPHAGE-XR) 500 MG 24 hr tablet TAKE 2 TABLETS(1000 MG) BY MOUTH DAILY WITH BREAKFAST 180 tablet 0  . butalbital-aspirin-caffeine (FIORINAL) 50-325-40 MG capsule Take 1 capsule by mouth every 6 (six) hours as needed for headache. 15 capsule 0  . celecoxib (CELEBREX) 100 MG capsule Take 1 capsule (100 mg total) by mouth 2 (two) times daily. (Patient not taking: Reported on 03/16/2019) 30 capsule 0   No current facility-administered medications for this visit.     Allergies  Allergen Reactions  . Erythromycin Itching  . Levaquin [Levofloxacin In D5w]     Light sensitivity     Social History   Socioeconomic History  . Marital status: Married    Spouse name: Not on file  . Number of children: 0  . Years of education: Not on file  . Highest education level: Not on file  Occupational History  . Occupation: Press photographer  Social Needs  . Financial resource strain: Not on file  . Food insecurity    Worry: Not on file  Inability: Not on file  . Transportation needs    Medical: Not on file    Non-medical: Not on file  Tobacco Use  . Smoking status: Former Smoker    Types: Cigarettes  . Smokeless tobacco: Never Used  Substance and Sexual Activity  . Alcohol use: Yes    Alcohol/week: 0.0 standard drinks    Comment: 2-3 beers a week  . Drug use: No  . Sexual activity: Yes  Lifestyle  . Physical activity    Days per week: Not on file     Minutes per session: Not on file  . Stress: Not on file  Relationships  . Social Herbalist on phone: Not on file    Gets together: Not on file    Attends religious service: Not on file    Active member of club or organization: Not on file    Attends meetings of clubs or organizations: Not on file    Relationship status: Not on file  . Intimate partner violence    Fear of current or ex partner: Not on file    Emotionally abused: Not on file    Physically abused: Not on file    Forced sexual activity: Not on file  Other Topics Concern  . Not on file  Social History Narrative   Marital status:  Married.       Children:   none      Lives:      Employment:  Press photographer      Tobacco:  none      Alcohol:  5-6 drinks per week      Drugs:   none      Exercise:      Seatbelt:      Education: [employer].    Review of Systems  Constitutional: Negative.  Negative for chills and fever.  HENT: Negative.  Negative for congestion, nosebleeds and sore throat.   Eyes: Negative.  Negative for blurred vision, double vision and pain.  Respiratory: Negative.  Negative for cough, hemoptysis and shortness of breath.   Cardiovascular: Negative.  Negative for chest pain.  Gastrointestinal: Negative for abdominal pain, diarrhea, nausea and vomiting.  Musculoskeletal: Negative.  Negative for back pain, myalgias and neck pain.  Skin: Negative.  Negative for rash.  Neurological: Positive for headaches. Negative for dizziness, sensory change, speech change, focal weakness, seizures and loss of consciousness.  All other systems reviewed and are negative.   Objective  Alert and oriented x3 in no apparent respiratory distress Vitals as reported by the patient: There were no vitals filed for this visit.  Diagnoses and all orders for this visit:  Acute intractable tension-type headache -     butalbital-aspirin-caffeine (FIORINAL) 50-325-40 MG capsule; Take 1 capsule by mouth every 6 (six) hours  as needed for headache.  Clinically stable.  No red flag signs or symptoms.  Recently tested negative for coronavirus.  Celebrex did help some.  Will try Fiorinal at this time as needed.  Advised to follow-up with me if no better in 2 weeks.  ED precautions given.   I discussed the assessment and treatment plan with the patient. The patient was provided an opportunity to ask questions and all were answered. The patient agreed with the plan and demonstrated an understanding of the instructions.   The patient was advised to call back or seek an in-person evaluation if the symptoms worsen or if the condition fails to improve as anticipated.  I provided 15  minutes of non-face-to-face time during this encounter.  [name], MD  Primary Care at [facility]

## 2019-04-04 DIAGNOSIS — E119 Type 2 diabetes mellitus without complications: Secondary | ICD-10-CM | POA: Diagnosis not present

## 2019-04-04 LAB — HM DIABETES EYE EXAM

## 2019-04-10 ENCOUNTER — Encounter: Payer: Self-pay | Admitting: *Deleted

## 2019-05-08 IMAGING — DX DG CHEST 2V
2 series · 2 of 2 positions shown · non-contrast
Comparison: Chest x-ray 02/22/2018.

CLINICAL DATA: 67-year-old male with history of cough for the past
2 weeks. Nonsmoker.

EXAM:
CHEST - 2 VIEW

[chest pa]
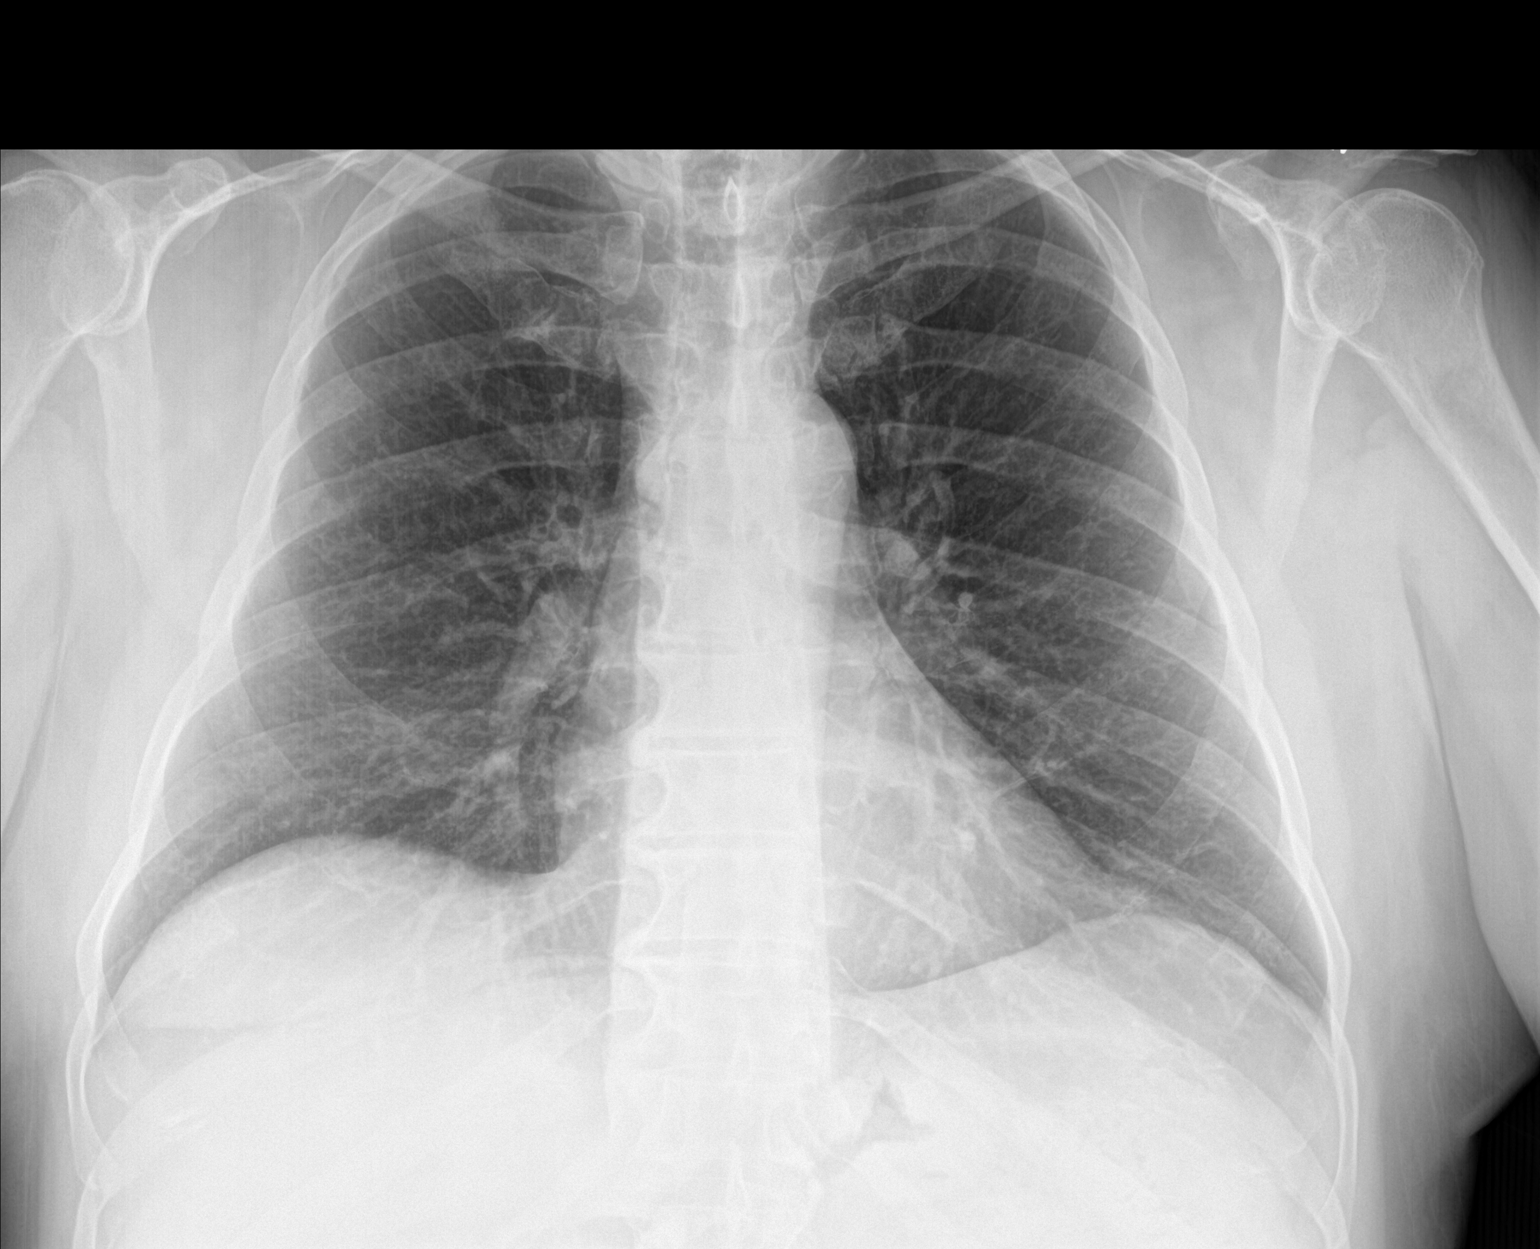

[chest lat]
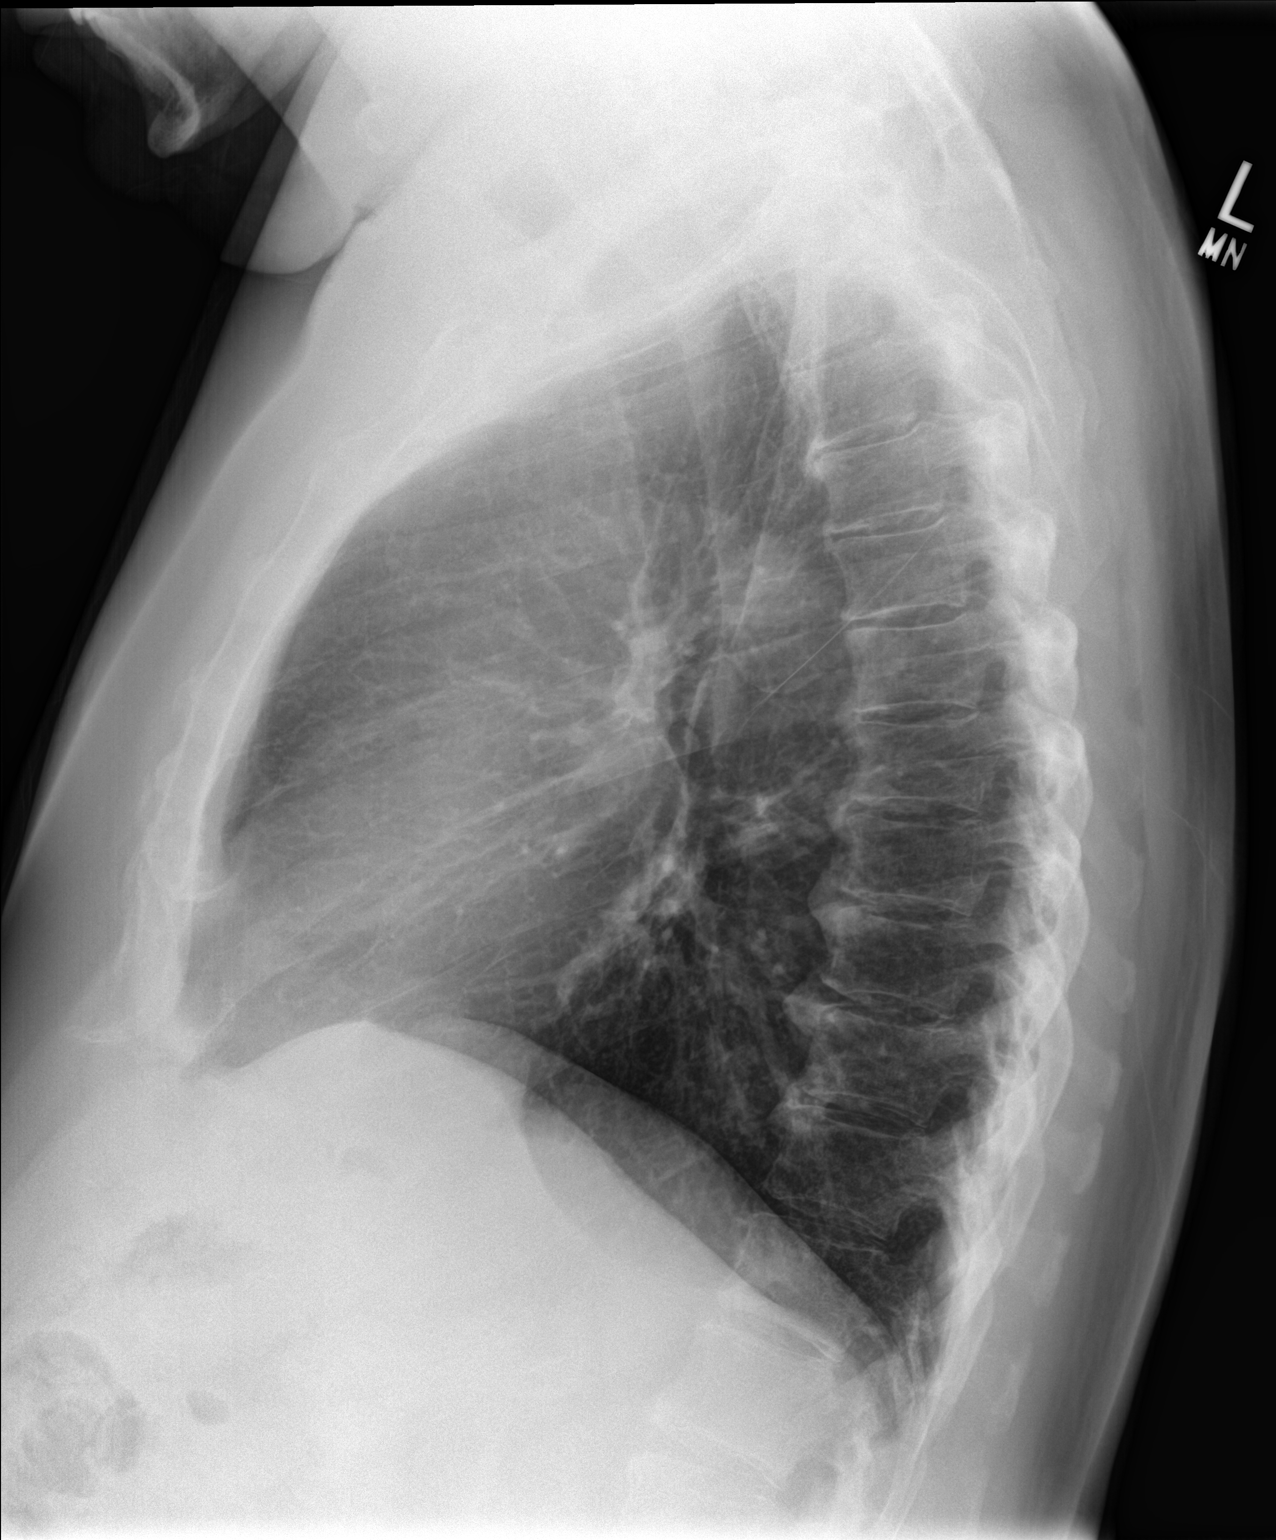

[2 of 2 positions shown; findings below may reference images not displayed]

FINDINGS: Lung volumes are normal. No consolidative airspace disease. No
pleural effusions. No pneumothorax. No pulmonary nodule or mass
noted. Pulmonary vasculature and the cardiomediastinal silhouette
are within normal limits.
IMPRESSION: No radiographic evidence of acute cardiopulmonary disease.

## 2019-05-22 ENCOUNTER — Encounter: Payer: Self-pay | Admitting: Emergency Medicine

## 2019-05-23 ENCOUNTER — Other Ambulatory Visit: Payer: Self-pay | Admitting: *Deleted

## 2019-05-23 MED ORDER — ATORVASTATIN CALCIUM 40 MG PO TABS: 40.0000 mg | ORAL_TABLET | Freq: Every day | ORAL | 0 refills | Status: DC

## 2019-05-25 ENCOUNTER — Other Ambulatory Visit: Payer: Self-pay | Admitting: Emergency Medicine

## 2019-05-25 DIAGNOSIS — E119 Type 2 diabetes mellitus without complications: Secondary | ICD-10-CM

## 2019-05-26 MED ORDER — METFORMIN HCL ER 500 MG PO TB24: 500.0000 mg | ORAL_TABLET | Freq: Every day | ORAL | 0 refills | Status: DC

## 2019-05-29 ENCOUNTER — Encounter: Payer: Self-pay | Admitting: Emergency Medicine

## 2019-05-29 ENCOUNTER — Other Ambulatory Visit: Payer: Self-pay

## 2019-05-29 ENCOUNTER — Ambulatory Visit (INDEPENDENT_AMBULATORY_CARE_PROVIDER_SITE_OTHER): Payer: Medicare Other | Admitting: Emergency Medicine

## 2019-05-29 VITALS — BP 128/77 | HR 76 | Temp 98.0°F | Resp 16 | Ht 66.0 in | Wt 200.0 lb

## 2019-05-29 DIAGNOSIS — E1165 Type 2 diabetes mellitus with hyperglycemia: Secondary | ICD-10-CM | POA: Diagnosis not present

## 2019-05-29 DIAGNOSIS — R519 Headache, unspecified: Secondary | ICD-10-CM

## 2019-05-29 DIAGNOSIS — I10 Essential (primary) hypertension: Secondary | ICD-10-CM

## 2019-05-29 DIAGNOSIS — Z23 Encounter for immunization: Secondary | ICD-10-CM | POA: Diagnosis not present

## 2019-05-29 DIAGNOSIS — M791 Myalgia, unspecified site: Secondary | ICD-10-CM | POA: Diagnosis not present

## 2019-05-29 DIAGNOSIS — R51 Headache: Secondary | ICD-10-CM | POA: Diagnosis not present

## 2019-05-29 DIAGNOSIS — E1159 Type 2 diabetes mellitus with other circulatory complications: Secondary | ICD-10-CM

## 2019-05-29 DIAGNOSIS — I152 Hypertension secondary to endocrine disorders: Secondary | ICD-10-CM | POA: Insufficient documentation

## 2019-05-29 DIAGNOSIS — E785 Hyperlipidemia, unspecified: Secondary | ICD-10-CM

## 2019-05-29 DIAGNOSIS — E1169 Type 2 diabetes mellitus with other specified complication: Secondary | ICD-10-CM | POA: Diagnosis not present

## 2019-05-29 LAB — POCT GLYCOSYLATED HEMOGLOBIN (HGB A1C): Hemoglobin A1C: 6.9 % — AB (ref 4.0–5.6)

## 2019-05-29 LAB — GLUCOSE, POCT (MANUAL RESULT ENTRY): POC Glucose: 81 mg/dl (ref 70–99)

## 2019-05-29 MED ORDER — GLIPIZIDE 5 MG PO TABS: ORAL_TABLET | ORAL | 1 refills | Status: DC

## 2019-05-29 MED ORDER — METFORMIN HCL ER 500 MG PO TB24: 500.0000 mg | ORAL_TABLET | Freq: Every day | ORAL | 0 refills | Status: DC

## 2019-05-29 MED ORDER — ROSUVASTATIN CALCIUM 5 MG PO TABS: 5.0000 mg | ORAL_TABLET | Freq: Every day | ORAL | 3 refills | Status: DC

## 2019-05-29 NOTE — Progress Notes (Signed)
Aaron [name] 68 y.o.   Chief Complaint  Patient presents with  . Muscle Pain    per patient started 2 weeks ago  . Headache    patient had telemedicine on 03/16/2019, per patient he has one now  . Medication Refill    gllipizide and metformin    HISTORY OF PRESENT ILLNESS: This is a 68 y.o. male with history of diabetes and hypertension: 1.  Diabetes: On metformin and glipizide twice a day. 2.  Hypertension: On Norvasc 5 mg daily. Has several complaints today: 1.  Upper and lower extremity muscle pains for 2 weeks.  Patient presently taking atorvastatin.  Denies injuries. 2.  "Stress headaches" 3-4 times weekly that started last January.  No history of migraine headaches.  Muscle Pain Associated symptoms include headaches. Pertinent negatives include no abdominal pain, chest pain, fever, nausea, rash, shortness of breath or vomiting.  Headache  Pertinent negatives include no abdominal pain, coughing, dizziness, fever, nausea, sore throat or vomiting.  Medication Refill Associated symptoms include headaches and myalgias. Pertinent negatives include no abdominal pain, chest pain, chills, congestion, coughing, fever, nausea, rash, sore throat or vomiting.     Prior to Admission medications   Medication Sig Start Date End Date Taking? Authorizing Provider  amLODipine (NORVASC) 5 MG tablet Take 1 tablet (5 mg total) by mouth daily. 05/04/18  Yes Jaynee Eagles, PA-C  atorvastatin (LIPITOR) 40 MG tablet Take 1 tablet (40 mg total) by mouth daily. 05/23/19  Yes Paysley Poplar, Ines Bloomer, MD  fluocinolone (VANOS) 0.01 % cream Apply topically 2 (two) times daily. 01/03/19  Yes Breuna Loveall, Ines Bloomer, MD  glipiZIDE (GLUCOTROL) 5 MG tablet TAKE 1 TABLET(5 MG) BY MOUTH DAILY BEFORE BREAKFAST 05/29/19  Yes Aniah Pauli, Ines Bloomer, MD  metFORMIN (GLUCOPHAGE-XR) 500 MG 24 hr tablet Take 1 tablet (500 mg total) by mouth daily at 8 pm. 05/29/19  Yes Torres Hardenbrook, Ines Bloomer, MD  butalbital-aspirin-caffeine  [facility]) [phone] MG capsule Take 1 capsule by mouth every 6 (six) hours as needed for headache. Patient not taking: Reported on 05/29/2019 03/16/19   [name], MD  celecoxib (CELEBREX) 100 MG capsule Take 1 capsule (100 mg total) by mouth 2 (two) times daily. Patient not taking: Reported on 05/29/2019 02/16/19   Maryruth Hancock, MD  rosuvastatin (CRESTOR) 5 MG tablet Take 1 tablet (5 mg total) by mouth daily. 05/29/19   [name], MD    Allergies  Allergen Reactions  . Erythromycin Itching  . Levaquin [Levofloxacin In D5w]     Light sensitivity     Patient Active Problem List   Diagnosis Date Noted  . Gastroesophageal reflux disease without esophagitis 03/13/2017  . Type 2 diabetes mellitus without complication, without long-term current use of insulin ([place]) 03/13/2017    Past Medical History:  Diagnosis Date  . Diabetes mellitus without complication ([place])   . GERD (gastroesophageal reflux disease)   . Hyperlipidemia   . Hypertension     History reviewed. No pertinent surgical history.  Social History   Socioeconomic History  . Marital status: Married    Spouse name: Not on file  . Number of children: 0  . Years of education: Not on file  . Highest education level: Not on file  Occupational History  . Occupation: Press photographer  Social Needs  . Financial resource strain: Not on file  . Food insecurity    Worry: Not on file    Inability: Not on file  . Transportation needs    Medical:  Not on file    Non-medical: Not on file  Tobacco Use  . Smoking status: Former Smoker    Types: Cigarettes  . Smokeless tobacco: Never Used  Substance and Sexual Activity  . Alcohol use: Yes    Alcohol/week: 0.0 standard drinks    Comment: 2-3 beers a week  . Drug use: No  . Sexual activity: Yes  Lifestyle  . Physical activity    Days per week: Not on file    Minutes per session: Not on file  . Stress: Not on file  Relationships  . Social Herbalist  on phone: Not on file    Gets together: Not on file    Attends religious service: Not on file    Active member of club or organization: Not on file    Attends meetings of clubs or organizations: Not on file    Relationship status: Not on file  . Intimate partner violence    Fear of current or ex partner: Not on file    Emotionally abused: Not on file    Physically abused: Not on file    Forced sexual activity: Not on file  Other Topics Concern  . Not on file  Social History Narrative   Marital status:  Married.       Children:   none      Lives:      Employment:  Press photographer      Tobacco:  none      Alcohol:  5-6 drinks per week      Drugs:   none      Exercise:      Seatbelt:      Education: [employer].    Family History  Problem Relation Age of Onset  . Cancer Mother        bone marrow cancer  . Cancer Father 71       brain cancer     Review of Systems  Constitutional: Negative.  Negative for chills and fever.  HENT: Negative.  Negative for congestion and sore throat.   Eyes: Negative.   Respiratory: Negative.  Negative for cough and shortness of breath.   Cardiovascular: Negative.  Negative for chest pain and palpitations.  Gastrointestinal: Negative for abdominal pain, nausea and vomiting.  Musculoskeletal: Positive for myalgias.  Skin: Negative.  Negative for rash.  Neurological: Positive for headaches. Negative for dizziness.  Endo/Heme/Allergies: Negative.   All other systems reviewed and are negative.  Today's Vitals   05/29/19 1601  BP: 128/77  Pulse: 76  Resp: 16  Temp: 98 F (36.7 C)  TempSrc: Oral  SpO2: 97%  Weight: 200 lb (90.7 kg)  Height: 5\' 6"  (1.676 m)   Body mass index is 32.28 kg/m.   Physical Exam Vitals signs reviewed.  Constitutional:      Appearance: He is well-developed.  HENT:     Head: Normocephalic.  Eyes:     Extraocular Movements: Extraocular movements intact.     Conjunctiva/sclera: Conjunctivae normal.     Pupils:  Pupils are equal, round, and reactive to light.  Neck:     Musculoskeletal: Normal range of motion and neck supple.  Cardiovascular:     Rate and Rhythm: Normal rate and regular rhythm.     Pulses: Normal pulses.     Heart sounds: Normal heart sounds.  Pulmonary:     Effort: Pulmonary effort is normal.     Breath sounds: Normal breath sounds.  Abdominal:  Palpations: Abdomen is soft.     Tenderness: There is no abdominal tenderness.  Musculoskeletal: Normal range of motion.  Skin:    General: Skin is warm and dry.     Capillary Refill: Capillary refill takes less than 2 seconds.  Neurological:     General: No focal deficit present.     Mental Status: He is oriented to person, place, and time.  Psychiatric:        Mood and Affect: Mood normal.        Behavior: Behavior normal.     Results for orders placed or performed in visit on 05/29/19 (from the past 24 hour(s))  POCT glucose (manual entry)     Status: None   Collection Time: 05/29/19  4:50 PM  Result Value Ref Range   POC Glucose 81 70 - 99 mg/dl  POCT glycosylated hemoglobin (Hb A1C)     Status: Abnormal   Collection Time: 05/29/19  4:50 PM  Result Value Ref Range   Hemoglobin A1C 6.9 (A) 4.0 - 5.6 %   HbA1c POC (<> result, manual entry)     HbA1c, POC (prediabetic range)     HbA1c, POC (controlled diabetic range)      ASSESSMENT & PLAN: Hypertension associated with diabetes ([place]) Much improved diabetes with hemoglobin A1c at 6.9.  Continue metformin and glipizide twice a day. Blood pressure under control.  Continue present medication.  No changes. Follow-up in 6 months.  Dyslipidemia associated with type 2 diabetes mellitus (HCC) Muscle aches most likely secondary to atorvastatin.  Blood work done including a CPK level.  Advised to stop atorvastatin for the next 1 to 2 weeks and monitor symptoms.  May consider using a hydrophilic statin such as Crestor 10 mg.  Aaron [name] was seen today for muscle pain, headache  and medication refill.  Diagnoses and all orders for this visit:  Hypertension associated with diabetes ([place])  Type 2 diabetes mellitus with hyperglycemia, without long-term current use of insulin (HCC) -     POCT glucose (manual entry) -     POCT glycosylated hemoglobin (Hb A1C) -     Microalbumin, urine -     CBC with Differential/Platelet -     Comprehensive metabolic panel -     Lipid panel -     metFORMIN (GLUCOPHAGE-XR) 500 MG 24 hr tablet; Take 1 tablet (500 mg total) by mouth daily at 8 pm. -     glipiZIDE (GLUCOTROL) 5 MG tablet; TAKE 1 TABLET(5 MG) BY MOUTH DAILY BEFORE BREAKFAST -     HM Diabetes Foot Exam -     rosuvastatin (CRESTOR) 5 MG tablet; Take 1 tablet (5 mg total) by mouth daily.  Need for prophylactic vaccination and inoculation against influenza -     Flu Vaccine QUAD High Dose(Fluad)  Need for prophylactic vaccination against Streptococcus pneumoniae (pneumococcus)  Persistent headaches -     Ambulatory referral to Neurology  Muscle pain -     CK  Dyslipidemia associated with type 2 diabetes mellitus ([place])  Other orders -     Pneumococcal polysaccharide vaccine 23-valent greater than or equal to 2yo subcutaneous/IM    Patient Instructions    Stop atorvastatin. Diabetes Mellitus and Nutrition, Adult When you have diabetes (diabetes mellitus), it is very important to have healthy eating habits because your blood sugar (glucose) levels are greatly affected by what you eat and drink. Eating healthy foods in the appropriate amounts, at about the same times every day, can help you:  Control your blood glucose.  Lower your risk of heart disease.  Improve your blood pressure.  Reach or maintain a healthy weight. Every person with diabetes is different, and each person has different needs for a meal plan. Your health care provider may recommend that you work with a diet and nutrition specialist (dietitian) to make a meal plan that is best for you. Your  meal plan may vary depending on factors such as:  The calories you need.  The medicines you take.  Your weight.  Your blood glucose, blood pressure, and cholesterol levels.  Your activity level.  Other health conditions you have, such as heart or kidney disease. How do carbohydrates affect me? Carbohydrates, also called carbs, affect your blood glucose level more than any other type of food. Eating carbs naturally raises the amount of glucose in your blood. Carb counting is a method for keeping track of how many carbs you eat. Counting carbs is important to keep your blood glucose at a healthy level, especially if you use insulin or take certain oral diabetes medicines. It is important to know how many carbs you can safely have in each meal. This is different for every person. Your dietitian can help you calculate how many carbs you should have at each meal and for each snack. Foods that contain carbs include:  Bread, cereal, rice, pasta, and crackers.  Potatoes and corn.  Peas, beans, and lentils.  Milk and yogurt.  Fruit and juice.  Desserts, such as cakes, cookies, ice cream, and candy. How does alcohol affect me? Alcohol can cause a sudden decrease in blood glucose (hypoglycemia), especially if you use insulin or take certain oral diabetes medicines. Hypoglycemia can be a life-threatening condition. Symptoms of hypoglycemia (sleepiness, dizziness, and confusion) are similar to symptoms of having too much alcohol. If your health care provider says that alcohol is safe for you, follow these guidelines:  Limit alcohol intake to no more than 1 drink per day for nonpregnant women and 2 drinks per day for men. One drink equals 12 oz of beer, 5 oz of wine, or 1 oz of hard liquor.  Do not drink on an empty stomach.  Keep yourself hydrated with water, diet soda, or unsweetened iced tea.  Keep in mind that regular soda, juice, and other mixers may contain a lot of sugar and must be  counted as carbs. What are tips for following this plan?  Reading food labels  Start by checking the serving size on the "Nutrition Facts" label of packaged foods and drinks. The amount of calories, carbs, fats, and other nutrients listed on the label is based on one serving of the item. Many items contain more than one serving per package.  Check the total grams (g) of carbs in one serving. You can calculate the number of servings of carbs in one serving by dividing the total carbs by 15. For example, if a food has 30 g of total carbs, it would be equal to 2 servings of carbs.  Check the number of grams (g) of saturated and trans fats in one serving. Choose foods that have low or no amount of these fats.  Check the number of milligrams (mg) of salt (sodium) in one serving. Most people should limit total sodium intake to less than 2,300 mg per day.  Always check the nutrition information of foods labeled as "low-fat" or "nonfat". These foods may be higher in added sugar or refined carbs and should be avoided.  Talk  to your dietitian to identify your daily goals for nutrients listed on the label. Shopping  Avoid buying canned, premade, or processed foods. These foods tend to be high in fat, sodium, and added sugar.  Shop around the outside edge of the grocery store. This includes fresh fruits and vegetables, bulk grains, fresh meats, and fresh dairy. Cooking  Use low-heat cooking methods, such as baking, instead of high-heat cooking methods like deep frying.  Cook using healthy oils, such as olive, canola, or sunflower oil.  Avoid cooking with butter, cream, or high-fat meats. Meal planning  Eat meals and snacks regularly, preferably at the same times every day. Avoid going long periods of time without eating.  Eat foods high in fiber, such as fresh fruits, vegetables, beans, and whole grains. Talk to your dietitian about how many servings of carbs you can eat at each meal.  Eat 4-6  ounces (oz) of lean protein each day, such as lean meat, chicken, fish, eggs, or tofu. One oz of lean protein is equal to: ? 1 oz of meat, chicken, or fish. ? 1 egg. ?  cup of tofu.  Eat some foods each day that contain healthy fats, such as avocado, nuts, seeds, and fish. Lifestyle  Check your blood glucose regularly.  Exercise regularly as told by your health care provider. This may include: ? 150 minutes of moderate-intensity or vigorous-intensity exercise each week. This could be brisk walking, biking, or water aerobics. ? Stretching and doing strength exercises, such as yoga or weightlifting, at least 2 times a week.  Take medicines as told by your health care provider.  Do not use any products that contain nicotine or tobacco, such as cigarettes and e-cigarettes. If you need help quitting, ask your health care provider.  Work with a Social worker or diabetes educator to identify strategies to manage stress and any emotional and social challenges. Questions to ask a health care provider  Do I need to meet with a diabetes educator?  Do I need to meet with a dietitian?  What number can I call if I have questions?  When are the best times to check my blood glucose? Where to find more information:  American Diabetes Association: diabetes.org  Academy of Nutrition and Dietetics: www.eatright.[employer] of Diabetes and Digestive and Kidney Diseases (NIH): [employer] Summary  A healthy meal plan will help you control your blood glucose and maintain a healthy lifestyle.  Working with a diet and nutrition specialist (dietitian) can help you make a meal plan that is best for you.  Keep in mind that carbohydrates (carbs) and alcohol have immediate effects on your blood glucose levels. It is important to count carbs and to use alcohol carefully. This information is not intended to replace advice given to you by your health care provider. Make sure you discuss any  questions you have with your health care provider. Document Released: 05/14/2005 Document Revised: 07/30/2017 Document Reviewed: 09/21/2016 Elsevier Patient Education  [employer].    If you have lab work done today you will be contacted with your lab results within the next 2 weeks.  If you have not heard from [place] then please contact us. The fastest way to get your results is to register for My Chart.   IF you received an x-ray today, you will receive an invoice from [facility]. Please contact [facility] at [phone] with questions or concerns regarding your invoice.   IF you received labwork today, you will receive an  invoice from [place] [facility]. Please contact [facility] at [phone] with questions or concerns regarding your invoice.   Our billing staff will not be able to assist you with questions regarding bills from these companies.  You will be contacted with the lab results as soon as they are available. The fastest way to get your results is to activate your My Chart account. Instructions are located on the last page of this paperwork. If you have not heard from [place] regarding the results in 2 weeks, please contact this office.         Agustina Caroli, MD [facility]

## 2019-05-29 NOTE — Patient Instructions (Addendum)
Stop atorvastatin. Diabetes Mellitus and Nutrition, Adult When you have diabetes (diabetes mellitus), it is very important to have healthy eating habits because your blood sugar (glucose) levels are greatly affected by what you eat and drink. Eating healthy foods in the appropriate amounts, at about the same times every day, can help you:  Control your blood glucose.  Lower your risk of heart disease.  Improve your blood pressure.  Reach or maintain a healthy weight. Every person with diabetes is different, and each person has different needs for a meal plan. Your health care provider may recommend that you work with a diet and nutrition specialist (dietitian) to make a meal plan that is best for you. Your meal plan may vary depending on factors such as:  The calories you need.  The medicines you take.  Your weight.  Your blood glucose, blood pressure, and cholesterol levels.  Your activity level.  Other health conditions you have, such as heart or kidney disease. How do carbohydrates affect me? Carbohydrates, also called carbs, affect your blood glucose level more than any other type of food. Eating carbs naturally raises the amount of glucose in your blood. Carb counting is a method for keeping track of how many carbs you eat. Counting carbs is important to keep your blood glucose at a healthy level, especially if you use insulin or take certain oral diabetes medicines. It is important to know how many carbs you can safely have in each meal. This is different for every person. Your dietitian can help you calculate how many carbs you should have at each meal and for each snack. Foods that contain carbs include:  Bread, cereal, rice, pasta, and crackers.  Potatoes and corn.  Peas, beans, and lentils.  Milk and yogurt.  Fruit and juice.  Desserts, such as cakes, cookies, ice cream, and candy. How does alcohol affect me? Alcohol can cause a sudden decrease in blood glucose  (hypoglycemia), especially if you use insulin or take certain oral diabetes medicines. Hypoglycemia can be a life-threatening condition. Symptoms of hypoglycemia (sleepiness, dizziness, and confusion) are similar to symptoms of having too much alcohol. If your health care provider says that alcohol is safe for you, follow these guidelines:  Limit alcohol intake to no more than 1 drink per day for nonpregnant women and 2 drinks per day for men. One drink equals 12 oz of beer, 5 oz of wine, or 1 oz of hard liquor.  Do not drink on an empty stomach.  Keep yourself hydrated with water, diet soda, or unsweetened iced tea.  Keep in mind that regular soda, juice, and other mixers may contain a lot of sugar and must be counted as carbs. What are tips for following this plan?  Reading food labels  Start by checking the serving size on the "Nutrition Facts" label of packaged foods and drinks. The amount of calories, carbs, fats, and other nutrients listed on the label is based on one serving of the item. Many items contain more than one serving per package.  Check the total grams (g) of carbs in one serving. You can calculate the number of servings of carbs in one serving by dividing the total carbs by 15. For example, if a food has 30 g of total carbs, it would be equal to 2 servings of carbs.  Check the number of grams (g) of saturated and trans fats in one serving. Choose foods that have low or no amount of these fats.  Check the  number of milligrams (mg) of salt (sodium) in one serving. Most people should limit total sodium intake to less than 2,300 mg per day.  Always check the nutrition information of foods labeled as "low-fat" or "nonfat". These foods may be higher in added sugar or refined carbs and should be avoided.  Talk to your dietitian to identify your daily goals for nutrients listed on the label. Shopping  Avoid buying canned, premade, or processed foods. These foods tend to be high  in fat, sodium, and added sugar.  Shop around the outside edge of the grocery store. This includes fresh fruits and vegetables, bulk grains, fresh meats, and fresh dairy. Cooking  Use low-heat cooking methods, such as baking, instead of high-heat cooking methods like deep frying.  Cook using healthy oils, such as olive, canola, or sunflower oil.  Avoid cooking with butter, cream, or high-fat meats. Meal planning  Eat meals and snacks regularly, preferably at the same times every day. Avoid going long periods of time without eating.  Eat foods high in fiber, such as fresh fruits, vegetables, beans, and whole grains. Talk to your dietitian about how many servings of carbs you can eat at each meal.  Eat 4-6 ounces (oz) of lean protein each day, such as lean meat, chicken, fish, eggs, or tofu. One oz of lean protein is equal to: ? 1 oz of meat, chicken, or fish. ? 1 egg. ?  cup of tofu.  Eat some foods each day that contain healthy fats, such as avocado, nuts, seeds, and fish. Lifestyle  Check your blood glucose regularly.  Exercise regularly as told by your health care provider. This may include: ? 150 minutes of moderate-intensity or vigorous-intensity exercise each week. This could be brisk walking, biking, or water aerobics. ? Stretching and doing strength exercises, such as yoga or weightlifting, at least 2 times a week.  Take medicines as told by your health care provider.  Do not use any products that contain nicotine or tobacco, such as cigarettes and e-cigarettes. If you need help quitting, ask your health care provider.  Work with a Social worker or diabetes educator to identify strategies to manage stress and any emotional and social challenges. Questions to ask a health care provider  Do I need to meet with a diabetes educator?  Do I need to meet with a dietitian?  What number can I call if I have questions?  When are the best times to check my blood glucose? Where to  find more information:  American Diabetes Association: diabetes.org  Academy of Nutrition and Dietetics: www.eatright.[employer] of Diabetes and Digestive and Kidney Diseases (NIH): [employer] Summary  A healthy meal plan will help you control your blood glucose and maintain a healthy lifestyle.  Working with a diet and nutrition specialist (dietitian) can help you make a meal plan that is best for you.  Keep in mind that carbohydrates (carbs) and alcohol have immediate effects on your blood glucose levels. It is important to count carbs and to use alcohol carefully. This information is not intended to replace advice given to you by your health care provider. Make sure you discuss any questions you have with your health care provider. Document Released: 05/14/2005 Document Revised: 07/30/2017 Document Reviewed: 09/21/2016 Elsevier Patient Education  [employer].    If you have lab work done today you will be contacted with your lab results within the next 2 weeks.  If you have not heard from [place] then please contact  us. The fastest way to get your results is to register for My Chart.   IF you received an x-ray today, you will receive an invoice from [facility]. Please contact [facility] at [phone] with questions or concerns regarding your invoice.   IF you received labwork today, you will receive an invoice from [facility]. Please contact [facility] at [phone] with questions or concerns regarding your invoice.   Our billing staff will not be able to assist you with questions regarding bills from these companies.  You will be contacted with the lab results as soon as they are available. The fastest way to get your results is to activate your My Chart account. Instructions are located on the last page of this paperwork. If you have not heard from [place] regarding the results in 2 weeks, please contact this office.

## 2019-05-29 NOTE — Assessment & Plan Note (Signed)
Much improved diabetes with hemoglobin A1c at 6.9.  Continue metformin and glipizide twice a day. Blood pressure under control.  Continue present medication.  No changes. Follow-up in 6 months.

## 2019-05-29 NOTE — Assessment & Plan Note (Signed)
Muscle aches most likely secondary to atorvastatin.  Blood work done including a CPK level.  Advised to stop atorvastatin for the next 1 to 2 weeks and monitor symptoms.  May consider using a hydrophilic statin such as Crestor 10 mg.

## 2019-05-30 ENCOUNTER — Encounter: Payer: Self-pay | Admitting: Emergency Medicine

## 2019-05-30 LAB — COMPREHENSIVE METABOLIC PANEL
ALT: 20 IU/L (ref 0–44)
AST: 16 IU/L (ref 0–40)
Albumin/Globulin Ratio: 1.6 (ref 1.2–2.2)
Albumin: 4.3 g/dL (ref 3.8–4.8)
Alkaline Phosphatase: 104 IU/L (ref 39–117)
BUN/Creatinine Ratio: 22 (ref 10–24)
BUN: 16 mg/dL (ref 8–27)
Bilirubin Total: 0.2 mg/dL (ref 0.0–1.2)
CO2: 19 mmol/L — ABNORMAL LOW (ref 20–29)
Calcium: 9.4 mg/dL (ref 8.6–10.2)
Chloride: 103 mmol/L (ref 96–106)
Creatinine, Ser: 0.73 mg/dL — ABNORMAL LOW (ref 0.76–1.27)
GFR calc Af Amer: 112 mL/min/{1.73_m2} (ref >59)
GFR calc non Af Amer: 97 mL/min/{1.73_m2} (ref >59)
Globulin, Total: 2.7 g/dL (ref 1.5–4.5)
Glucose: 75 mg/dL (ref 65–99)
Potassium: 4.1 mmol/L (ref 3.5–5.2)
Sodium: 140 mmol/L (ref 134–144)
Total Protein: 7 g/dL (ref 6.0–8.5)

## 2019-05-30 LAB — CBC WITH DIFFERENTIAL/PLATELET
Basophils Absolute: 0.1 10*3/uL (ref 0.0–0.2)
Basos: 1 %
EOS (ABSOLUTE): 0.1 10*3/uL (ref 0.0–0.4)
Eos: 1 %
Hematocrit: 46.6 % (ref 37.5–51.0)
Hemoglobin: 15.8 g/dL (ref 13.0–17.7)
Immature Grans (Abs): 0.1 10*3/uL (ref 0.0–0.1)
Immature Granulocytes: 1 %
Lymphocytes Absolute: 2 10*3/uL (ref 0.7–3.1)
Lymphs: 18 %
MCH: 28 pg (ref 26.6–33.0)
MCHC: 33.9 g/dL (ref 31.5–35.7)
MCV: 83 fL (ref 79–97)
Monocytes Absolute: 0.7 10*3/uL (ref 0.1–0.9)
Monocytes: 6 %
Neutrophils Absolute: 8.2 10*3/uL — ABNORMAL HIGH (ref 1.4–7.0)
Neutrophils: 73 %
Platelets: 285 10*3/uL (ref 150–450)
RBC: 5.65 x10E6/uL (ref 4.14–5.80)
RDW: 13 % (ref 11.6–15.4)
WBC: 11 10*3/uL — ABNORMAL HIGH (ref 3.4–10.8)

## 2019-05-30 LAB — LIPID PANEL
Chol/HDL Ratio: 2.3 ratio (ref 0.0–5.0)
Cholesterol, Total: 93 mg/dL — ABNORMAL LOW (ref 100–199)
HDL: 41 mg/dL (ref >39)
LDL Chol Calc (NIH): 34 mg/dL (ref 0–99)
Triglycerides: 90 mg/dL (ref 0–149)
VLDL Cholesterol Cal: 18 mg/dL (ref 5–40)

## 2019-05-30 LAB — MICROALBUMIN, URINE: Microalbumin, Urine: <3 ug/mL

## 2019-05-31 ENCOUNTER — Encounter: Payer: Self-pay | Admitting: Emergency Medicine

## 2019-05-31 ENCOUNTER — Other Ambulatory Visit: Payer: Self-pay | Admitting: Adult Health Nurse Practitioner

## 2019-05-31 MED ORDER — SIMVASTATIN 20 MG PO TABS: 20.0000 mg | ORAL_TABLET | Freq: Every day | ORAL | 3 refills | Status: DC

## 2019-06-11 ENCOUNTER — Encounter: Payer: Self-pay | Admitting: Emergency Medicine

## 2019-06-22 ENCOUNTER — Encounter: Payer: Self-pay | Admitting: Emergency Medicine

## 2019-06-26 ENCOUNTER — Other Ambulatory Visit: Payer: Self-pay

## 2019-06-26 DIAGNOSIS — E1159 Type 2 diabetes mellitus with other circulatory complications: Secondary | ICD-10-CM

## 2019-06-26 DIAGNOSIS — I152 Hypertension secondary to endocrine disorders: Secondary | ICD-10-CM

## 2019-06-26 MED ORDER — AMLODIPINE BESYLATE 5 MG PO TABS: 5.0000 mg | ORAL_TABLET | Freq: Every day | ORAL | 3 refills | Status: DC

## 2019-07-10 ENCOUNTER — Ambulatory Visit: Payer: Medicare Other | Admitting: Emergency Medicine

## 2019-07-12 ENCOUNTER — Other Ambulatory Visit: Payer: Self-pay

## 2019-07-12 ENCOUNTER — Encounter: Payer: Self-pay | Admitting: Neurology

## 2019-07-12 ENCOUNTER — Ambulatory Visit (INDEPENDENT_AMBULATORY_CARE_PROVIDER_SITE_OTHER): Payer: Medicare Other | Admitting: Neurology

## 2019-07-12 VITALS — BP 139/84 | HR 73 | Temp 97.7°F | Ht 66.0 in | Wt 204.0 lb

## 2019-07-12 DIAGNOSIS — R519 Headache, unspecified: Secondary | ICD-10-CM

## 2019-07-12 DIAGNOSIS — R51 Headache with orthostatic component, not elsewhere classified: Secondary | ICD-10-CM

## 2019-07-12 DIAGNOSIS — G441 Vascular headache, not elsewhere classified: Secondary | ICD-10-CM

## 2019-07-12 DIAGNOSIS — G4452 New daily persistent headache (NDPH): Secondary | ICD-10-CM | POA: Diagnosis not present

## 2019-07-12 NOTE — Patient Instructions (Signed)
MRI of the brain labwork Return in 8 weeks or sooner   General Headache Without Cause A headache is pain or discomfort that is felt around the head or neck area. There are many causes and types of headaches. In some cases, the cause may not be found. Follow these instructions at home: Watch your condition for any changes. Let your doctor know about them. Take these steps to help with your condition: Managing pain      Take over-the-counter and prescription medicines only as told by your doctor.  Lie down in a dark, quiet room when you have a headache.  If told, put ice on your head and neck area: ? Put ice in a plastic bag. ? Place a towel between your skin and the bag. ? Leave the ice on for 20 minutes, 2-3 times per day.  If told, put heat on the affected area. Use the heat source that your doctor recommends, such as a moist heat pack or a heating pad. ? Place a towel between your skin and the heat source. ? Leave the heat on for 20-30 minutes. ? Remove the heat if your skin turns bright red. This is very important if you are unable to feel pain, heat, or cold. You may have a greater risk of getting burned.  Keep lights dim if bright lights bother you or make your headaches worse. Eating and drinking  Eat meals on a regular schedule.  If you drink alcohol: ? Limit how much you use to:  0-1 drink a day for women.  0-2 drinks a day for men. ? Be aware of how much alcohol is in your drink. In the [place] one drink equals one 12 oz bottle of beer (355 mL), one 5 oz glass of wine (148 mL), or one 1 oz glass of hard liquor (44 mL).  Stop drinking caffeine, or reduce how much caffeine you drink. General instructions   Keep a journal to find out if certain things bring on headaches. For example, write down: ? What you eat and drink. ? How much sleep you get. ? Any change to your diet or medicines.  Get a massage or try other ways to relax.  Limit stress.  Sit up  straight. Do not tighten (tense) your muscles.  Do not use any products that contain nicotine or tobacco. This includes cigarettes, e-cigarettes, and chewing tobacco. If you need help quitting, ask your doctor.  Exercise regularly as told by your doctor.  Get enough sleep. This often means 7-9 hours of sleep each night.  Keep all follow-up visits as told by your doctor. This is important. Contact a doctor if:  Your symptoms are not helped by medicine.  You have a headache that feels different than the other headaches.  You feel sick to your stomach (nauseous) or you throw up (vomit).  You have a fever. Get help right away if:  Your headache gets very bad quickly.  Your headache gets worse after a lot of physical activity.  You keep throwing up.  You have a stiff neck.  You have trouble seeing.  You have trouble speaking.  You have pain in the eye or ear.  Your muscles are weak or you lose muscle control.  You lose your balance or have trouble walking.  You feel like you will pass out (faint) or you pass out.  You are mixed up (confused).  You have a seizure. Summary  A headache is pain or discomfort that is  felt around the head or neck area.  There are many causes and types of headaches. In some cases, the cause may not be found.  Keep a journal to help find out what causes your headaches. Watch your condition for any changes. Let your doctor know about them.  Contact a doctor if you have a headache that is different from usual, or if your headache is not helped by medicine.  Get help right away if your headache gets very bad, you throw up, you have trouble seeing, you lose your balance, or you have a seizure. This information is not intended to replace advice given to you by your health care provider. Make sure you discuss any questions you have with your health care provider. Document Released: 05/26/2008 Document Revised: 03/07/2018 Document Reviewed:  03/07/2018 Elsevier Patient Education  2020 [employer].

## 2019-07-12 NOTE — Progress Notes (Signed)
[facility]    Provider:  Dr Jaynee Eagles Requesting Provider: [name], * Primary Care Provider:  [name], MD  CC:  Headaches and neck pain, muscle pain all over  HPI:  Aaron [name] is a 68 y.o. male here as requested by [name], * for persistent headache as well as headaches and myalgias.  I reviewed Dr. Barry Brunner notes.  Patient has muscle pain that started 2 weeks prior to last appointment which was May 29, 2019.  He has a past medical history of diabetes on Metformin and glipizide, hypertension on Norvasc 5 mg, hyperlipidemia on a statin.  Symptoms included upper and lower extremity muscle pain, no injuries, and "stress headaches" 3-4 times weekly this started in January.  No history of migraine headaches.  No other associated symptoms, no abdominal pain, chest pain, fever, nausea, rash, shortness of breath or vomiting, coughing, dizziness, fever.  Examination was normal including physical examination and neurologic exam was nonfocal.  CBC 1 month ago showed elevated white blood cells.  Hemoglobin A1c 6.54-monthago, 6 months ago was 8.7.  CMP unremarkable.  He is here alone. One day a few months ago he had a headache. It was consistent, about March or April. He went to urgent care, neg for covid19, he was started on fioricet and it did not help. No hx of migraines. Excedrin did not help. Feels like he is wearing a hat that is on tight. It comes and goes. He notices it in the morning it gets better when he gets to work. Or with laying in bed. No throbbing. He rarely had headaches in the past in the temples but this is different. Can last for hours. Hasn't progressed or improved. Coffee maybe helps. Unknown triggers. Annoying not excruciating. No vision problems or vision changes. No hearing changes. He gets muscle aches in the arms, no weakness, started prior to the headache, he has some cervical neck pain, no radicular symptoms, he  has low back pain that is long-term and stable. He has some numbness and tingling in his feet but not frequently. He denies snoring or excessive fatigue during the day, he doesn't complain about not feeling refreshed. No associated light or sound sensitivity, no migraines. He stopped the statin in September still symptomatic. No fevers. Some stiffness in the neck but no deficits of range of motion.   Reviewed notes, labs and imaging from outside physicians, which showed:   Reviewed XR cervical spine images and agree: INGS: Vertebral body height and alignment are maintained. Loss of disc space height and endplate spurring are seen at C5-6. The C7-T1 level is not visualized has no swimmer's view is provided.  IMPRESSION: C5-6 degenerative disc disease.  Review of Systems: Patient complains of symptoms per HPI as well as the following symptoms: headache, muscle pain. Pertinent negatives and positives per HPI. All others negative.   Social History   Socioeconomic History  . Marital status: Married    Spouse name: Not on file  . Number of children: 0  . Years of education: Not on file  . Highest education level: High school graduate  Occupational History  . Occupation: SPress photographer Social Needs  . Financial resource strain: Not on file  . Food insecurity    Worry: Not on file    Inability: Not on file  . Transportation needs    Medical: Not on file    Non-medical: Not on file  Tobacco Use  . Smoking status: Former Smoker  Types: Cigarettes    Quit date: 1985    Years since quitting: 35.8  . Smokeless tobacco: Never Used  Substance and Sexual Activity  . Alcohol use: Yes    Alcohol/week: 7.0 standard drinks    Types: 7 Cans of beer per week    Comment: 1 beer with dinner usually daily  . Drug use: No  . Sexual activity: Yes  Lifestyle  . Physical activity    Days per week: Not on file    Minutes per session: Not on file  . Stress: Not on file  Relationships  . Social  Herbalist on phone: Not on file    Gets together: Not on file    Attends religious service: Not on file    Active member of club or organization: Not on file    Attends meetings of clubs or organizations: Not on file    Relationship status: Not on file  . Intimate partner violence    Fear of current or ex partner: Not on file    Emotionally abused: Not on file    Physically abused: Not on file    Forced sexual activity: Not on file  Other Topics Concern  . Not on file  Social History Narrative   Marital status:  Married.       Children:   none      Lives:      Employment:  Press photographer      Tobacco:  none      Alcohol:  About 7 drinks per week      Drugs:   none      Exercise:      Seatbelt:      Education: [employer].      Lives at home with wife   Right handed   Caffeine: 2 cups/day    Family History  Problem Relation Age of Onset  . Cancer Mother        bone marrow cancer  . Cancer Father 10       brain cancer  . Migraines Neg Hx   . Headache Neg Hx     Past Medical History:  Diagnosis Date  . Diabetes mellitus without complication ([place])   . GERD (gastroesophageal reflux disease)   . Hyperlipidemia   . Hypertension     Patient Active Problem List   Diagnosis Date Noted  . NDPH (new daily persistent headache) 07/12/2019  . Hypertension associated with diabetes ([place]) 05/29/2019  . Gastroesophageal reflux disease without esophagitis 03/13/2017  . Dyslipidemia associated with type 2 diabetes mellitus ([place]) 03/13/2017    History reviewed. No pertinent surgical history.  Current Outpatient Medications  Medication Sig Dispense Refill  . amLODipine (NORVASC) 5 MG tablet Take 1 tablet (5 mg total) by mouth daily. 90 tablet 3  . fluocinolone (VANOS) 0.01 % cream Apply topically 2 (two) times daily. 30 g 3  . glipiZIDE (GLUCOTROL) 5 MG tablet TAKE 1 TABLET(5 MG) BY MOUTH DAILY BEFORE BREAKFAST 90 tablet 1  . metFORMIN (GLUCOPHAGE-XR) 500 MG 24 hr tablet  Take 1 tablet (500 mg total) by mouth daily at 8 pm. 180 tablet 0  . UNABLE TO FIND Take 500 mg by mouth daily. Med Name: Cinsulin Cinnamon    . celecoxib (CELEBREX) 100 MG capsule Take 1 capsule (100 mg total) by mouth 2 (two) times daily. (Patient not taking: Reported on 07/12/2019) 30 capsule 0  . rosuvastatin (CRESTOR) 5 MG tablet Take 1 tablet (5 mg total)  by mouth daily. (Patient not taking: Reported on 07/12/2019) 90 tablet 3  . simvastatin (ZOCOR) 20 MG tablet Take 1 tablet (20 mg total) by mouth at bedtime. (Patient not taking: Reported on 07/12/2019) 30 tablet 3   No current facility-administered medications for this visit.     Allergies as of 07/12/2019 - Review Complete 07/12/2019  Allergen Reaction Noted  . Erythromycin Itching 09/23/2013  . Levaquin [levofloxacin in d5w]  03/01/2018    Vitals: BP 139/84 (BP Location: Right Arm, Patient Position: Sitting)   Pulse 73   Temp 97.7 F (36.5 C) Comment: taken at front door  Ht _0  (1.676 m)   Wt 204 lb (92.5 kg)   BMI 32.93 kg/m  Last Weight:  Wt Readings from Last 1 Encounters:  07/12/19 204 lb (92.5 kg)   Last Height:   Ht Readings from Last 1 Encounters:  07/12/19 _1  (1.676 m)     Physical exam: Exam: Gen: NAD, conversant, well nourised, obese, well groomed                     CV: RRR, no MRG. No Carotid Bruits. No peripheral edema, warm, nontender Eyes: Conjunctivae clear without exudates or hemorrhage  Neuro: Detailed Neurologic Exam  Speech:    Speech is normal; fluent and spontaneous with normal comprehension.  Cognition:    The patient is oriented to person, place, and time;     recent and remote memory intact;     language fluent;     normal attention, concentration,     fund of knowledge Cranial Nerves:    The pupils are equal, round, and reactive to light. The fundi are normal and spontaneous venous pulsations are present. Visual fields are full to finger confrontation. Extraocular movements  are intact. Trigeminal sensation is intact and the muscles of mastication are normal. The face is symmetric. The palate elevates in the midline. Hearing intact. Voice is normal. Shoulder shrug is normal. The tongue has normal motion without fasciculations.   Coordination:    Normal finger to nose and heel to shin. Normal rapid alternating movements.   Gait:    Heel-toe and tandem gait are normal.   Motor Observation:    No asymmetry, no atrophy, and no involuntary movements noted. Tone:    Normal muscle tone.    Posture:    Posture is normal. normal erect    Strength:    Strength is V/V in the upper and lower limbs.      Sensation: intact to LT     Reflex Exam:  DTR's:    Deep tendon reflexes in the upper and lower extremities are normal bilaterally.   Toes:    The toes are downgoing bilaterally.   Clonus:    Clonus is absent.    Assessment/Plan: This is a very nice 68 year old gentleman with a past medical history of diabetes, hypertension, hyperlipidemia, obesity who is here for new daily persistent headache that is not migrainous, muscle aches, concerning symptoms include positional quality worse when lying down.  Unclear etiology, today I will check a CK for muscle enzymes, will check an ESR and CRP to see if there is any inflammation such as polymyalgia rheumatica.  Given the positional quality and new onset of his headaches, especially in the setting that he has not had headaches in the past, definitely need an MRI of the brain with and without contrast to look for space-occupying lesions or masses that could be causing intracranial hypertension  making the headache worse when he lays down.  Also interesting is he notices the headaches when he wakes up in the morning, however he does not snore, he does not state that he feels particularly fatigued during the day but his Mallampati is 4 and he is obese, may consider sleep apnea; he will monitor frequency and timing of headaches and  let me know some more and also speak with his wife regarding his sleeping patterns or snoring.  - if all is negative schedule emg/ncs and possibly sleep test as above - see patient back in 8 weeks  Discussed: To prevent or relieve headaches, try the following: Cool Compress. Lie down and place a cool compress on your head.  Avoid headache triggers. If certain foods or odors seem to have triggered your migraines in the past, avoid them. A headache diary might help you identify triggers.  Include physical activity in your daily routine. Try a daily walk or other moderate aerobic exercise.  Manage stress. Find healthy ways to cope with the stressors, such as delegating tasks on your to-do list.  Practice relaxation techniques. Try deep breathing, yoga, massage and visualization.  Eat regularly. Eating regularly scheduled meals and maintaining a healthy diet might help prevent headaches. Also, drink plenty of fluids.  Follow a regular sleep schedule. Sleep deprivation might contribute to headaches Consider biofeedback. With this mind-body technique, you learn to control certain bodily functions - such as muscle tension, heart rate and blood pressure - to prevent headaches or reduce headache pain.    Proceed to emergency room if you experience new or worsening symptoms or symptoms do not resolve, if you have new neurologic symptoms or if headache is severe, or for any concerning symptom.   Provided education and documentation from American headache Society toolbox including articles on: chronic migraine medication overuse headache, chronic migraines, prevention of migraines, behavioral and other nonpharmacologic treatments for headache.    Orders Placed This Encounter  Procedures  . MR BRAIN W WO CONTRAST  . Sedimentation rate  . C-reactive protein  . CK  . Basic Metabolic Panel  . CBC with Differential/Platelets   No orders of the defined types were placed in this encounter.   Cc:  [name], *,  [place], [place], [place]  Sarina Ill, MD  [facility] [address] [place], [place] [address]  Phone [phone] Fax [phone]

## 2019-07-13 LAB — BASIC METABOLIC PANEL
BUN/Creatinine Ratio: 14 (ref 10–24)
BUN: 12 mg/dL (ref 8–27)
CO2: 23 mmol/L (ref 20–29)
Calcium: 8.8 mg/dL (ref 8.6–10.2)
Chloride: 105 mmol/L (ref 96–106)
Creatinine, Ser: 0.85 mg/dL (ref 0.76–1.27)
GFR calc Af Amer: 105 mL/min/{1.73_m2} (ref >59)
GFR calc non Af Amer: 91 mL/min/{1.73_m2} (ref >59)
Glucose: 197 mg/dL — ABNORMAL HIGH (ref 65–99)
Potassium: 4.7 mmol/L (ref 3.5–5.2)
Sodium: 141 mmol/L (ref 134–144)

## 2019-07-13 LAB — CBC WITH DIFFERENTIAL/PLATELET
Basophils Absolute: 0.1 10*3/uL (ref 0.0–0.2)
Basos: 1 %
EOS (ABSOLUTE): 0.1 10*3/uL (ref 0.0–0.4)
Eos: 1 %
Hematocrit: 47.6 % (ref 37.5–51.0)
Hemoglobin: 15.4 g/dL (ref 13.0–17.7)
Immature Grans (Abs): 0 10*3/uL (ref 0.0–0.1)
Immature Granulocytes: 1 %
Lymphocytes Absolute: 1.7 10*3/uL (ref 0.7–3.1)
Lymphs: 28 %
MCH: 27.5 pg (ref 26.6–33.0)
MCHC: 32.4 g/dL (ref 31.5–35.7)
MCV: 85 fL (ref 79–97)
Monocytes Absolute: 0.5 10*3/uL (ref 0.1–0.9)
Monocytes: 9 %
Neutrophils Absolute: 3.8 10*3/uL (ref 1.4–7.0)
Neutrophils: 60 %
Platelets: 291 10*3/uL (ref 150–450)
RBC: 5.61 x10E6/uL (ref 4.14–5.80)
RDW: 13 % (ref 11.6–15.4)
WBC: 6.2 10*3/uL (ref 3.4–10.8)

## 2019-07-13 LAB — CK: Total CK: 109 U/L (ref 41–331)

## 2019-07-13 LAB — C-REACTIVE PROTEIN: CRP: 5 mg/L (ref 0–10)

## 2019-07-13 LAB — SEDIMENTATION RATE: Sed Rate: 13 mm/hr (ref 0–30)

## 2019-08-14 ENCOUNTER — Other Ambulatory Visit: Payer: Self-pay

## 2019-08-14 ENCOUNTER — Ambulatory Visit
Admission: RE | Admit: 2019-08-14 | Discharge: 2019-08-14 | Disposition: A | Discharge status: Home / Self Care | Payer: Medicare Other | Source: Ambulatory Visit | Attending: Neurology | Admitting: Neurology

## 2019-08-14 DIAGNOSIS — R51 Headache with orthostatic component, not elsewhere classified: Secondary | ICD-10-CM

## 2019-08-14 DIAGNOSIS — G4452 New daily persistent headache (NDPH): Secondary | ICD-10-CM

## 2019-08-14 DIAGNOSIS — G441 Vascular headache, not elsewhere classified: Secondary | ICD-10-CM

## 2019-08-14 DIAGNOSIS — R519 Headache, unspecified: Secondary | ICD-10-CM

## 2019-08-14 MED ORDER — GADOBENATE DIMEGLUMINE 529 MG/ML IV SOLN
19.0000 mL | Freq: Once | INTRAVENOUS | Status: AC | PRN
  Administered 2019-08-14: 19 mL via INTRAVENOUS

## 2019-09-11 ENCOUNTER — Encounter: Payer: Self-pay | Admitting: Neurology

## 2019-09-11 ENCOUNTER — Ambulatory Visit (INDEPENDENT_AMBULATORY_CARE_PROVIDER_SITE_OTHER): Payer: Medicare Other | Admitting: Neurology

## 2019-09-11 ENCOUNTER — Other Ambulatory Visit: Payer: Self-pay

## 2019-09-11 VITALS — BP 145/87 | HR 76 | Temp 96.2°F | Ht 66.0 in | Wt 207.0 lb

## 2019-09-11 DIAGNOSIS — R519 Headache, unspecified: Secondary | ICD-10-CM | POA: Insufficient documentation

## 2019-09-11 MED ORDER — CYCLOBENZAPRINE HCL 10 MG PO TABS: 5.0000 mg | ORAL_TABLET | Freq: Every day | ORAL | 3 refills | Status: DC

## 2019-09-11 NOTE — Patient Instructions (Addendum)
Wear guards at bedtime to prevent grinding which may cause morning headaches Take flexeril 1/2-full tablet at bedtime If this does not help, email me and we can try 5 units of botox in each masseter Also, recommend seeing Dr. Brett Fairy sleep specialist if these things fail we we can monitor overnight (all vitals, breathing, O2, motor) to see why you get headache in the morning.  Cyclobenzaprine tablets What is this medicine? CYCLOBENZAPRINE (sye kloe BEN za preen) is a muscle relaxer. It is used to treat muscle pain, spasms, and stiffness. This medicine may be used for other purposes; ask your health care provider or pharmacist if you have questions. COMMON BRAND NAME(S): Fexmid, Flexeril What should I tell my health care provider before I take this medicine? They need to know if you have any of these conditions:  heart disease, irregular heartbeat, or previous heart attack  liver disease  thyroid problem  an unusual or allergic reaction to cyclobenzaprine, tricyclic antidepressants, lactose, other medicines, foods, dyes, or preservatives  pregnant or trying to get pregnant  breast-feeding How should I use this medicine? Take this medicine by mouth with a glass of water. Follow the directions on the prescription label. If this medicine upsets your stomach, take it with food or milk. Take your medicine at regular intervals. Do not take it more often than directed. Talk to your pediatrician regarding the use of this medicine in children. Special care may be needed. Overdosage: If you think you have taken too much of this medicine contact a poison control center or emergency room at once. NOTE: This medicine is only for you. Do not share this medicine with others. What if I miss a dose? If you miss a dose, take it as soon as you can. If it is almost time for your next dose, take only that dose. Do not take double or extra doses. What may interact with this medicine? Do not take this  medicine with any of the following medications:  MAOIs like Carbex, Eldepryl, Marplan, Nardil, and Parnate  narcotic medicines for cough  safinamide This medicine may also interact with the following medications:  alcohol  bupropion  antihistamines for allergy, cough and cold  certain medicines for anxiety or sleep  certain medicines for bladder problems like oxybutynin, tolterodine  certain medicines for depression like amitriptyline, fluoxetine, sertraline  certain medicines for Parkinson's disease like benztropine, trihexyphenidyl  certain medicines for seizures like phenobarbital, primidone  certain medicines for stomach problems like dicyclomine, hyoscyamine  certain medicines for travel sickness like scopolamine  general anesthetics like halothane, isoflurane, methoxyflurane, propofol  ipratropium  local anesthetics like lidocaine, pramoxine, tetracaine  medicines that relax muscles for surgery  narcotic medicines for pain  phenothiazines like chlorpromazine, mesoridazine, prochlorperazine, thioridazine  verapamil This list may not describe all possible interactions. Give your health care provider a list of all the medicines, herbs, non-prescription drugs, or dietary supplements you use. Also tell them if you smoke, drink alcohol, or use illegal drugs. Some items may interact with your medicine. What should I watch for while using this medicine? Tell your doctor or health care professional if your symptoms do not start to get better or if they get worse. You may get drowsy or dizzy. Do not drive, use machinery, or do anything that needs mental alertness until you know how this medicine affects you. Do not stand or sit up quickly, especially if you are an older patient. This reduces the risk of dizzy or fainting spells. Alcohol may  interfere with the effect of this medicine. Avoid alcoholic drinks. If you are taking another medicine that also causes drowsiness, you  may have more side effects. Give your health care provider a list of all medicines you use. Your doctor will tell you how much medicine to take. Do not take more medicine than directed. Call emergency for help if you have problems breathing or unusual sleepiness. Your mouth may get dry. Chewing sugarless gum or sucking hard candy, and drinking plenty of water may help. Contact your doctor if the problem does not go away or is severe. What side effects may I notice from receiving this medicine? Side effects that you should report to your doctor or health care professional as soon as possible:  allergic reactions like skin rash, itching or hives, swelling of the face, lips, or tongue  breathing problems  chest pain  fast, irregular heartbeat  hallucinations  seizures  unusually weak or tired Side effects that usually do not require medical attention (report to your doctor or health care professional if they continue or are bothersome):  headache  nausea, vomiting This list may not describe all possible side effects. Call your doctor for medical advice about side effects. You may report side effects to FDA at 1-800-FDA-1088. Where should I keep my medicine? Keep out of the reach of children. Store at room temperature between 15 and 30 degrees C (59 and 86 degrees F). Keep container tightly closed. Throw away any unused medicine after the expiration date. NOTE: This sheet is a summary. It may not cover all possible information. If you have questions about this medicine, talk to your doctor, pharmacist, or health care provider.  2020 Elsevier/Gold Standard (2018-07-20 12:49:26)

## 2019-09-11 NOTE — Progress Notes (Signed)
[facility]    Provider:  Dr Jaynee Eagles Requesting Provider: [name], * Primary Care Provider:  [name], MD  CC:  Headaches and neck pain, muscle pain all over  Interval history 07/11/2020: He saw the dentist and he is clenching and grinding at night, he is going to get a mouthguard. No changes since he has been seen. He is still waking up with the headache. Will add Flexeril at bedtime for teeth clenching. We discussed a plan.  HPI:  Aaron [name] is a 69 y.o. male here as requested by [name], * for persistent headache as well as headaches and myalgias.  I reviewed Dr. Barry Brunner notes.  Patient has muscle pain that started 2 weeks prior to last appointment which was May 29, 2019.  He has a past medical history of diabetes on Metformin and glipizide, hypertension on Norvasc 5 mg, hyperlipidemia on a statin.  Symptoms included upper and lower extremity muscle pain, no injuries, and "stress headaches" 3-4 times weekly this started in January.  No history of migraine headaches.  No other associated symptoms, no abdominal pain, chest pain, fever, nausea, rash, shortness of breath or vomiting, coughing, dizziness, fever.  Examination was normal including physical examination and neurologic exam was nonfocal.  CBC 1 month ago showed elevated white blood cells.  Hemoglobin A1c 6.78-monthago, 6 months ago was 8.7.  CMP unremarkable.  He is here alone. One day a few months ago he had a headache. It was consistent, about March or April. He went to urgent care, neg for covid19, he was started on fioricet and it did not help. No hx of migraines. Excedrin did not help. Feels like he is wearing a hat that is on tight. It comes and goes. He notices it in the morning it gets better when he gets to work. Or with laying in bed. No throbbing. He rarely had headaches in the past in the temples but this is different. Can last for hours. Hasn't progressed  or improved. Coffee maybe helps. Unknown triggers. Annoying not excruciating. No vision problems or vision changes. No hearing changes. He gets muscle aches in the arms, no weakness, started prior to the headache, he has some cervical neck pain, no radicular symptoms, he has low back pain that is long-term and stable. He has some numbness and tingling in his feet but not frequently. He denies snoring or excessive fatigue during the day, he doesn't complain about not feeling refreshed. No associated light or sound sensitivity, no migraines. He stopped the statin in September still symptomatic. No fevers. Some stiffness in the neck but no deficits of range of motion.   Reviewed notes, labs and imaging from outside physicians, which showed:   Reviewed XR cervical spine images and agree: INGS: Vertebral body height and alignment are maintained. Loss of disc space height and endplate spurring are seen at C5-6. The C7-T1 level is not visualized has no swimmer's view is provided.  IMPRESSION: C5-6 degenerative disc disease.  Review of Systems: Patient complains of symptoms per HPI as well as the following symptoms: headache, muscle pain. Pertinent negatives and positives per HPI. All others negative.   Social History   Socioeconomic History  . Marital status: Married    Spouse name: Not on file  . Number of children: 0  . Years of education: Not on file  . Highest education level: High school graduate  Occupational History  . Occupation: Sales  Tobacco Use  . Smoking  status: Former Smoker    Types: Cigarettes    Quit date: 1985    Years since quitting: 36.0  . Smokeless tobacco: Never Used  Substance and Sexual Activity  . Alcohol use: Yes    Alcohol/week: 7.0 standard drinks    Types: 7 Cans of beer per week    Comment: 1 beer with dinner usually daily  . Drug use: No  . Sexual activity: Yes  Other Topics Concern  . Not on file  Social History Narrative   Marital status:   Married.       Children:   none      Lives:      Employment:  Press photographer      Tobacco:  none      Alcohol:  About 7 drinks per week      Drugs:   none      Exercise:      Seatbelt:      Education: [employer].      Lives at home with wife   Right handed   Caffeine: 2 cups/day   Social Determinants of Health   Financial Resource Strain:   . Difficulty of Paying Living Expenses: Not on file  Food Insecurity:   . Worried About Charity fundraiser in the Last Year: Not on file  . Ran Out of Food in the Last Year: Not on file  Transportation Needs:   . Lack of Transportation (Medical): Not on file  . Lack of Transportation (Non-Medical): Not on file  Physical Activity:   . Days of Exercise per Week: Not on file  . Minutes of Exercise per Session: Not on file  Stress:   . Feeling of Stress : Not on file  Social Connections:   . Frequency of Communication with Friends and Family: Not on file  . Frequency of Social Gatherings with Friends and Family: Not on file  . Attends Religious Services: Not on file  . Active Member of Clubs or Organizations: Not on file  . Attends Archivist Meetings: Not on file  . Marital Status: Not on file  Intimate Partner Violence:   . Fear of Current or Ex-Partner: Not on file  . Emotionally Abused: Not on file  . Physically Abused: Not on file  . Sexually Abused: Not on file    Family History  Problem Relation Age of Onset  . Cancer Mother        bone marrow cancer  . Cancer Father 45       brain cancer  . Migraines Neg Hx   . Headache Neg Hx     Past Medical History:  Diagnosis Date  . Diabetes mellitus without complication ([place])   . GERD (gastroesophageal reflux disease)   . Hyperlipidemia   . Hypertension     Patient Active Problem List   Diagnosis Date Noted  . NDPH (new daily persistent headache) 07/12/2019  . Hypertension associated with diabetes ([place]) 05/29/2019  . Gastroesophageal reflux disease without esophagitis  03/13/2017  . Dyslipidemia associated with type 2 diabetes mellitus ([place]) 03/13/2017    History reviewed. No pertinent surgical history.  Current Outpatient Medications  Medication Sig Dispense Refill  . amLODipine (NORVASC) 5 MG tablet Take 1 tablet (5 mg total) by mouth daily. 90 tablet 3  . celecoxib (CELEBREX) 100 MG capsule Take 1 capsule (100 mg total) by mouth 2 (two) times daily. 30 capsule 0  . fluocinolone (VANOS) 0.01 % cream Apply topically 2 (two) times  daily. 30 g 3  . glipiZIDE (GLUCOTROL) 5 MG tablet TAKE 1 TABLET(5 MG) BY MOUTH DAILY BEFORE BREAKFAST 90 tablet 1  . metFORMIN (GLUCOPHAGE-XR) 500 MG 24 hr tablet Take 1 tablet (500 mg total) by mouth daily at 8 pm. 180 tablet 0  . rosuvastatin (CRESTOR) 5 MG tablet Take 1 tablet (5 mg total) by mouth daily. 90 tablet 3  . simvastatin (ZOCOR) 20 MG tablet Take 1 tablet (20 mg total) by mouth at bedtime. 30 tablet 3  . UNABLE TO FIND Take 500 mg by mouth daily. Med Name: Cinsulin Cinnamon    . cyclobenzaprine (FLEXERIL) 10 MG tablet Take 0.5-1 tablets (5-10 mg total) by mouth at bedtime. 30 tablet 3   No current facility-administered medications for this visit.    Allergies as of 09/11/2019 - Review Complete 09/11/2019  Allergen Reaction Noted  . Erythromycin Itching 09/23/2013  . Levaquin [levofloxacin in d5w]  03/01/2018    Vitals: BP (!) 145/87 (BP Location: Right Arm, Patient Position: Sitting)   Pulse 76   Temp (!) 96.2 F (35.7 C) Comment: taken at front  Ht '5\' 6"'  (1.676 m)   Wt 207 lb (93.9 kg)   BMI 33.41 kg/m  Last Weight:  Wt Readings from Last 1 Encounters:  09/11/19 207 lb (93.9 kg)   Last Height:   Ht Readings from Last 1 Encounters:  09/11/19 '5\' 6"'  (1.676 m)     Physical exam: Exam: Gen: NAD, conversant, well nourised, obese, well groomed                     CV: RRR, no MRG. No Carotid Bruits. No peripheral edema, warm, nontender Eyes: Conjunctivae clear without exudates or  hemorrhage  Neuro: Detailed Neurologic Exam  Speech:    Speech is normal; fluent and spontaneous with normal comprehension.  Cognition:    The patient is oriented to person, place, and time;     recent and remote memory intact;     language fluent;     normal attention, concentration,     fund of knowledge Cranial Nerves:    The pupils are equal, round, and reactive to light. The fundi are normal and spontaneous venous pulsations are present. Visual fields are full to finger confrontation. Extraocular movements are intact. Trigeminal sensation is intact and the muscles of mastication are normal. The face is symmetric. The palate elevates in the midline. Hearing intact. Voice is normal. Shoulder shrug is normal. The tongue has normal motion without fasciculations.   Coordination:    Normal finger to nose and heel to shin. Normal rapid alternating movements.   Gait:    Heel-toe and tandem gait are normal.   Motor Observation:    No asymmetry, no atrophy, and no involuntary movements noted. Tone:    Normal muscle tone.    Posture:    Posture is normal. normal erect    Strength:    Strength is V/V in the upper and lower limbs.      Sensation: intact to LT     Reflex Exam:  DTR's:    Deep tendon reflexes in the upper and lower extremities are normal bilaterally.   Toes:    The toes are downgoing bilaterally.   Clonus:    Clonus is absent.    Assessment/Plan: This is a very nice 69 year old gentleman with a past medical history of diabetes, hypertension, hyperlipidemia, obesity who is here for new daily persistent headache that is not migrainous, muscle aches, concerning  symptoms include positional quality worse when lying down.  Unclear etiology, today I will check a CK for muscle enzymes, will check an ESR and CRP to see if there is any inflammation such as polymyalgia rheumatica.  Given the positional quality and new onset of his headaches, especially in the setting that he  has not had headaches in the past, definitely need an MRI of the brain with and without contrast to look for space-occupying lesions or masses that could be causing intracranial hypertension making the headache worse when he lays down.  Also interesting is he notices the headaches when he wakes up in the morning, however he does not snore, he does not state that he feels particularly fatigued during the day but his Mallampati is 4 and he is obese, may consider sleep apnea; he will monitor frequency and timing of headaches and let me know some more and also speak with his wife regarding his sleeping patterns or snoring.(previous plan was: if all is negative schedule possibly emg/ncs and possibly sleep test as above)  MRI of the brain was normal. CBC/CMP/TSH/CK normal.   - Wear guards at bedtime to prevent grinding which may cause morning headaches - Take flexeril 1/2-full tablet - If this does not help, email me and we can try 5 units of botox in each masseter - Also, recommend seeing Dr. Brett Fairy sleep specialist if these above fail we we can monitor overnight (all vitals, breathing, O2, motor) to see why he gets headache in the morning. Cancel in advance(>1 week notice)  if above helps. - emg/ncs in the future if needed, improved   Discussed: To prevent or relieve headaches, try the following: Cool Compress. Lie down and place a cool compress on your head.  Avoid headache triggers. If certain foods or odors seem to have triggered your migraines in the past, avoid them. A headache diary might help you identify triggers.  Include physical activity in your daily routine. Try a daily walk or other moderate aerobic exercise.  Manage stress. Find healthy ways to cope with the stressors, such as delegating tasks on your to-do list.  Practice relaxation techniques. Try deep breathing, yoga, massage and visualization.  Eat regularly. Eating regularly scheduled meals and maintaining a healthy diet might help  prevent headaches. Also, drink plenty of fluids.  Follow a regular sleep schedule. Sleep deprivation might contribute to headaches Consider biofeedback. With this mind-body technique, you learn to control certain bodily functions -- such as muscle tension, heart rate and blood pressure -- to prevent headaches or reduce headache pain.    Proceed to emergency room if you experience new or worsening symptoms or symptoms do not resolve, if you have new neurologic symptoms or if headache is severe, or for any concerning symptom.   Provided education and documentation from American headache Society toolbox including articles on: chronic migraine medication overuse headache, chronic migraines, prevention of migraines, behavioral and other nonpharmacologic treatments for headache.    Orders Placed This Encounter  Procedures  . Ambulatory referral to Sleep Studies   Meds ordered this encounter  Medications  . cyclobenzaprine (FLEXERIL) 10 MG tablet    Sig: Take 0.5-1 tablets (5-10 mg total) by mouth at bedtime.    Dispense:  30 tablet    Refill:  3    Cc: Sagardia, Ines Bloomer, *,    Sarina Ill, MD  [facility] [address] [place], [place] [address]  A total of 40 minutes was spent face-to-face with this  patient. Over half this time was spent on counseling patient on the  1. Morning headache    diagnosis and different diagnostic and therapeutic options, counseling and coordination of care, risks ans benefits of management, compliance, or risk factor reduction and education.     Phone [phone] Fax [phone]

## 2019-09-25 ENCOUNTER — Encounter: Payer: Self-pay | Admitting: Emergency Medicine

## 2019-09-27 ENCOUNTER — Telehealth: Payer: Self-pay | Admitting: *Deleted

## 2019-09-27 NOTE — Telephone Encounter (Signed)
Schedule AWV.  

## 2019-10-02 ENCOUNTER — Other Ambulatory Visit: Payer: Self-pay | Admitting: *Deleted

## 2019-10-02 DIAGNOSIS — E1165 Type 2 diabetes mellitus with hyperglycemia: Secondary | ICD-10-CM

## 2019-10-02 MED ORDER — METFORMIN HCL ER 500 MG PO TB24: 500.0000 mg | ORAL_TABLET | Freq: Every day | ORAL | 0 refills | Status: DC

## 2019-10-05 ENCOUNTER — Ambulatory Visit (INDEPENDENT_AMBULATORY_CARE_PROVIDER_SITE_OTHER): Payer: Medicare Other

## 2019-10-05 ENCOUNTER — Encounter: Payer: Self-pay | Admitting: Emergency Medicine

## 2019-10-05 ENCOUNTER — Ambulatory Visit (INDEPENDENT_AMBULATORY_CARE_PROVIDER_SITE_OTHER): Payer: Medicare Other | Admitting: Emergency Medicine

## 2019-10-05 ENCOUNTER — Other Ambulatory Visit: Payer: Self-pay

## 2019-10-05 VITALS — BP 140/81 | HR 80 | Temp 98.0°F | Wt 206.0 lb

## 2019-10-05 DIAGNOSIS — M1712 Unilateral primary osteoarthritis, left knee: Secondary | ICD-10-CM | POA: Diagnosis not present

## 2019-10-05 DIAGNOSIS — M25562 Pain in left knee: Secondary | ICD-10-CM

## 2019-10-05 DIAGNOSIS — R600 Localized edema: Secondary | ICD-10-CM | POA: Diagnosis not present

## 2019-10-05 MED ORDER — TRAMADOL HCL 50 MG PO TABS: 50.0000 mg | ORAL_TABLET | Freq: Three times a day (TID) | ORAL | 0 refills | Status: AC | PRN

## 2019-10-05 MED ORDER — MELOXICAM 15 MG PO TABS: 15.0000 mg | ORAL_TABLET | Freq: Every day | ORAL | 1 refills | Status: AC

## 2019-10-05 NOTE — Progress Notes (Signed)
Aaron [name] 69 y.o.   Chief Complaint  Patient presents with  . Knee Pain    left knee pain with no known injury. Been hurting since 10/02/19. Have tried tylenol, knee sleeve and icy hot and nothing is helping    HISTORY OF PRESENT ILLNESS: This is a 69 y.o. male complaining of acute sharp steady pain to his left knee that started 3 days ago.  Denies trauma or any type of injury.  No pain radiation or associated symptoms.  Has been walking more than usual.  Tried Tylenol but not helping much.  No other complaints or medical concerns today.  HPI   Prior to Admission medications   Medication Sig Start Date End Date Taking? Authorizing Provider  amLODipine (NORVASC) 5 MG tablet Take 1 tablet (5 mg total) by mouth daily. 06/26/19  Yes Jasn Xia, Ines Bloomer, MD  cyclobenzaprine (FLEXERIL) 10 MG tablet Take 0.5-1 tablets (5-10 mg total) by mouth at bedtime. 09/11/19  Yes Melvenia Beam, MD  fluocinolone (VANOS) 0.01 % cream Apply topically 2 (two) times daily. 01/03/19  Yes Hawthorne Day, Ines Bloomer, MD  glipiZIDE (GLUCOTROL) 5 MG tablet TAKE 1 TABLET(5 MG) BY MOUTH DAILY BEFORE BREAKFAST 05/29/19  Yes Kasch Borquez, Ines Bloomer, MD  metFORMIN (GLUCOPHAGE-XR) 500 MG 24 hr tablet Take 1 tablet (500 mg total) by mouth daily at 8 pm. 10/02/19  Yes Hayven Croy, Ines Bloomer, MD  UNABLE TO FIND Take 500 mg by mouth daily. Med Name: Cinsulin Cinnamon   Yes [provider]    Allergies  Allergen Reactions  . Erythromycin Itching  . Levaquin [Levofloxacin In D5w]     Light sensitivity     Patient Active Problem List   Diagnosis Date Noted  . Morning headache 09/11/2019  . NDPH (new daily persistent headache) 07/12/2019  . Hypertension associated with diabetes ([place]) 05/29/2019  . Gastroesophageal reflux disease without esophagitis 03/13/2017  . Dyslipidemia associated with type 2 diabetes mellitus ([place]) 03/13/2017    Past Medical History:  Diagnosis Date  . Diabetes mellitus without  complication ([place])   . GERD (gastroesophageal reflux disease)   . Hyperlipidemia   . Hypertension     History reviewed. No pertinent surgical history.  Social History   Socioeconomic History  . Marital status: Married    Spouse name: Not on file  . Number of children: 0  . Years of education: Not on file  . Highest education level: High school graduate  Occupational History  . Occupation: Sales  Tobacco Use  . Smoking status: Former Smoker    Types: Cigarettes    Quit date: 1985    Years since quitting: 36.1  . Smokeless tobacco: Never Used  Substance and Sexual Activity  . Alcohol use: Yes    Alcohol/week: 7.0 standard drinks    Types: 7 Cans of beer per week    Comment: 1 beer with dinner usually daily  . Drug use: No  . Sexual activity: Yes  Other Topics Concern  . Not on file  Social History Narrative   Marital status:  Married.       Children:   none      Lives:      Employment:  Press photographer      Tobacco:  none      Alcohol:  About 7 drinks per week      Drugs:   none      Exercise:      Seatbelt:      Education: [employer].  Lives at home with wife   Right handed   Caffeine: 2 cups/day   Social Determinants of Health   Financial Resource Strain:   . Difficulty of Paying Living Expenses: Not on file  Food Insecurity:   . Worried About Charity fundraiser in the Last Year: Not on file  . Ran Out of Food in the Last Year: Not on file  Transportation Needs:   . Lack of Transportation (Medical): Not on file  . Lack of Transportation (Non-Medical): Not on file  Physical Activity:   . Days of Exercise per Week: Not on file  . Minutes of Exercise per Session: Not on file  Stress:   . Feeling of Stress : Not on file  Social Connections:   . Frequency of Communication with Friends and Family: Not on file  . Frequency of Social Gatherings with Friends and Family: Not on file  . Attends Religious Services: Not on file  . Active Member of Clubs or  Organizations: Not on file  . Attends Archivist Meetings: Not on file  . Marital Status: Not on file  Intimate Partner Violence:   . Fear of Current or Ex-Partner: Not on file  . Emotionally Abused: Not on file  . Physically Abused: Not on file  . Sexually Abused: Not on file    Family History  Problem Relation Age of Onset  . Cancer Mother        bone marrow cancer  . Cancer Father 62       brain cancer  . Migraines Neg Hx   . Headache Neg Hx      Review of Systems  Constitutional: Negative.  Negative for chills and fever.  HENT: Negative.  Negative for congestion and sore throat.   Respiratory: Negative.  Negative for cough and shortness of breath.   Cardiovascular: Negative.  Negative for chest pain and palpitations.  Gastrointestinal: Negative.  Negative for abdominal pain, diarrhea, nausea and vomiting.  Genitourinary: Negative.  Negative for dysuria and hematuria.  Musculoskeletal: Positive for joint pain (Left knee).  Skin: Negative.  Negative for rash.  Neurological: Negative.  Negative for dizziness and headaches.  All other systems reviewed and are negative.  Today's Vitals   10/05/19 1006  BP: 140/81  Pulse: 80  Temp: 98 F (36.7 C)  TempSrc: Temporal  SpO2: 97%  Weight: 206 lb (93.4 kg)   Body mass index is 33.25 kg/m.   Physical Exam Vitals reviewed.  Constitutional:      Appearance: Normal appearance.  HENT:     Head: Normocephalic.  Eyes:     Extraocular Movements: Extraocular movements intact.     Pupils: Pupils are equal, round, and reactive to light.  Cardiovascular:     Rate and Rhythm: Normal rate.  Pulmonary:     Effort: Pulmonary effort is normal.  Musculoskeletal:     Cervical back: Normal range of motion.     Comments: Left knee: No erythema or bruising.  No significant swelling.  Positive medial tenderness with limited range of motion due to pain.  No signs of DVT in left lower extremity.  Skin:    General: Skin is  warm and dry.     Capillary Refill: Capillary refill takes less than 2 seconds.  Neurological:     General: No focal deficit present.     Mental Status: He is alert and oriented to person, place, and time.  Psychiatric:        Mood and  Affect: Mood normal.        Behavior: Behavior normal.    DG Knee Complete 4 Views Left  Result Date: 10/05/2019 CLINICAL DATA:  Left knee pain EXAM: LEFT KNEE - COMPLETE 4+ VIEW COMPARISON:  None. FINDINGS: No fracture or dislocation of the left knee. Mild tricompartmental joint space narrowing and osteophytosis. No knee joint effusion. Soft tissue edema anteriorly. IMPRESSION: 1. No fracture or dislocation of the left knee. Mild tricompartmental arthrosis. 2.  Soft tissue edema anteriorly. Electronically Signed   By: Eddie Candle M.D.   On: 10/05/2019 11:16     ASSESSMENT & PLAN: Aaron [name] was seen today for knee pain.  Diagnoses and all orders for this visit:  Acute pain of left knee -     DG Knee Complete 4 Views Left; Future -     traMADol (ULTRAM) 50 MG tablet; Take 1 tablet (50 mg total) by mouth every 8 (eight) hours as needed for up to 5 days.  Osteoarthritis of left knee, unspecified osteoarthritis type -     meloxicam (MOBIC) 15 MG tablet; Take 1 tablet (15 mg total) by mouth daily for 7 days.    Patient Instructions       If you have lab work done today you will be contacted with your lab results within the next 2 weeks.  If you have not heard from [place] then please contact us. The fastest way to get your results is to register for My Chart.   IF you received an x-ray today, you will receive an invoice from [facility]. Please contact [facility] at [phone] with questions or concerns regarding your invoice.   IF you received labwork today, you will receive an invoice from [facility]. Please contact [facility] at [phone] with questions or concerns regarding your invoice.   Our billing staff will not be able to  assist you with questions regarding bills from these companies.  You will be contacted with the lab results as soon as they are available. The fastest way to get your results is to activate your My Chart account. Instructions are located on the last page of this paperwork. If you have not heard from [place] regarding the results in 2 weeks, please contact this office.      Acute Knee Pain, Adult Many things can cause knee pain. Sometimes, knee pain is sudden (acute) and may be caused by damage, swelling, or irritation of the muscles and tissues that support your knee. The pain often goes away on its own with time and rest. If the pain does not go away, tests may be done to find out what is causing the pain. Follow these instructions at home: Pay attention to any changes in your symptoms. Take these actions to relieve your pain. If you have a knee sleeve or brace:   Wear the sleeve or brace as told by your doctor. Remove it only as told by your doctor.  Loosen the sleeve or brace if your toes: ? Tingle. ? Become numb. ? Turn cold and blue.  Keep the sleeve or brace clean.  If the sleeve or brace is not waterproof: ? Do not let it get wet. ? Cover it with a watertight covering when you take a bath or shower. Activity  Rest your knee.  Do not do things that cause pain.  Avoid activities where both feet leave the ground at the same time (high-impact activities). Examples are running, jumping rope, and doing jumping jacks.  Work with a  physical therapist to make a safe exercise program, as told by your doctor. Managing pain, stiffness, and swelling   If told, put ice on the knee: ? Put ice in a plastic bag. ? Place a towel between your skin and the bag. ? Leave the ice on for 20 minutes, 2-3 times a day.  If told, put pressure (compression) on your injured knee to control swelling, give support, and help with discomfort. Compression may be done with an elastic bandage. General  instructions  Take all medicines only as told by your doctor.  Raise (elevate) your knee while you are sitting or lying down. Make sure your knee is higher than your heart.  Sleep with a pillow under your knee.  Do not use any products that contain nicotine or tobacco. These include cigarettes, e-cigarettes, and chewing tobacco. These products may slow down healing. If you need help quitting, ask your doctor.  If you are overweight, work with your doctor and a food expert (dietitian) to set goals to lose weight. Being overweight can make your knee hurt more.  Keep all follow-up visits as told by your doctor. This is important. Contact a doctor if:  The knee pain does not stop.  The knee pain changes or gets worse.  You have a fever along with knee pain.  Your knee feels warm when you touch it.  Your knee gives out or locks up. Get help right away if:  Your knee swells, and the swelling gets worse.  You cannot move your knee.  You have very bad knee pain. Summary  Many things can cause knee pain. The pain often goes away on its own with time and rest.  Your doctor may do tests to find out the cause of the pain.  Pay attention to any changes in your symptoms. Relieve your pain with rest, medicines, light activity, and use of ice.  Get help right away if you cannot move your knee or your knee pain is very bad. This information is not intended to replace advice given to you by your health care provider. Make sure you discuss any questions you have with your health care provider. Document Revised: 01/27/2018 Document Reviewed: 01/27/2018 Elsevier Patient Education  2020 Elsevier Inc.      Agustina Caroli, MD [facility]

## 2019-10-05 NOTE — Patient Instructions (Addendum)
If you have lab work done today you will be contacted with your lab results within the next 2 weeks.  If you have not heard from [place] then please contact us. The fastest way to get your results is to register for My Chart.   IF you received an x-ray today, you will receive an invoice from [facility]. Please contact [facility] at [phone] with questions or concerns regarding your invoice.   IF you received labwork today, you will receive an invoice from [facility]. Please contact [facility] at [phone] with questions or concerns regarding your invoice.   Our billing staff will not be able to assist you with questions regarding bills from these companies.  You will be contacted with the lab results as soon as they are available. The fastest way to get your results is to activate your My Chart account. Instructions are located on the last page of this paperwork. If you have not heard from [place] regarding the results in 2 weeks, please contact this office.      Acute Knee Pain, Adult Many things can cause knee pain. Sometimes, knee pain is sudden (acute) and may be caused by damage, swelling, or irritation of the muscles and tissues that support your knee. The pain often goes away on its own with time and rest. If the pain does not go away, tests may be done to find out what is causing the pain. Follow these instructions at home: Pay attention to any changes in your symptoms. Take these actions to relieve your pain. If you have a knee sleeve or brace:   Wear the sleeve or brace as told by your doctor. Remove it only as told by your doctor.  Loosen the sleeve or brace if your toes: ? Tingle. ? Become numb. ? Turn cold and blue.  Keep the sleeve or brace clean.  If the sleeve or brace is not waterproof: ? Do not let it get wet. ? Cover it with a watertight covering when you take a bath or shower. Activity  Rest your knee.  Do not do things that cause  pain.  Avoid activities where both feet leave the ground at the same time (high-impact activities). Examples are running, jumping rope, and doing jumping jacks.  Work with a physical therapist to make a safe exercise program, as told by your doctor. Managing pain, stiffness, and swelling   If told, put ice on the knee: ? Put ice in a plastic bag. ? Place a towel between your skin and the bag. ? Leave the ice on for 20 minutes, 2-3 times a day.  If told, put pressure (compression) on your injured knee to control swelling, give support, and help with discomfort. Compression may be done with an elastic bandage. General instructions  Take all medicines only as told by your doctor.  Raise (elevate) your knee while you are sitting or lying down. Make sure your knee is higher than your heart.  Sleep with a pillow under your knee.  Do not use any products that contain nicotine or tobacco. These include cigarettes, e-cigarettes, and chewing tobacco. These products may slow down healing. If you need help quitting, ask your doctor.  If you are overweight, work with your doctor and a food expert (dietitian) to set goals to lose weight. Being overweight can make your knee hurt more.  Keep all follow-up visits as told by your doctor. This is important. Contact a doctor if:  The knee pain does  not stop.  The knee pain changes or gets worse.  You have a fever along with knee pain.  Your knee feels warm when you touch it.  Your knee gives out or locks up. Get help right away if:  Your knee swells, and the swelling gets worse.  You cannot move your knee.  You have very bad knee pain. Summary  Many things can cause knee pain. The pain often goes away on its own with time and rest.  Your doctor may do tests to find out the cause of the pain.  Pay attention to any changes in your symptoms. Relieve your pain with rest, medicines, light activity, and use of ice.  Get help right away if  you cannot move your knee or your knee pain is very bad. This information is not intended to replace advice given to you by your health care provider. Make sure you discuss any questions you have with your health care provider. Document Revised: 01/27/2018 Document Reviewed: 01/27/2018 Elsevier Patient Education  [place].

## 2019-10-24 ENCOUNTER — Telehealth: Payer: Self-pay | Admitting: *Deleted

## 2019-10-24 NOTE — Telephone Encounter (Signed)
Left message with wife .  Patient will call back to schedule

## 2019-10-25 ENCOUNTER — Ambulatory Visit (INDEPENDENT_AMBULATORY_CARE_PROVIDER_SITE_OTHER): Payer: Medicare Other | Admitting: Emergency Medicine

## 2019-10-25 ENCOUNTER — Encounter: Payer: Self-pay | Admitting: *Deleted

## 2019-10-25 VITALS — BP 140/81 | Ht 66.0 in | Wt 206.0 lb

## 2019-10-25 DIAGNOSIS — Z0000 Encounter for general adult medical examination without abnormal findings: Secondary | ICD-10-CM

## 2019-10-25 NOTE — Progress Notes (Signed)
Presents today for [employer] Visit   Date of last exam:  10/05/2019  Interpreter used for this visit? No  I connected with  Aaron [name] on 10/25/19 by a telephone  and verified that I am speaking with the correct person using two identifiers.   I discussed the limitations of evaluation and management by telemedicine. The patient expressed understanding and agreed to proceed.    Patient Care Team: [name], MD as PCP - General (Internal Medicine)   Other items to address today:   Discussed Eye/Dental Discussed Immunizations Follow up scheduled 3-29 @ 9:00 6 th month Sagardia   Other Screening: Last screening for diabetes: 05/29/2019 Last lipid screening: 05/29/2019  ADVANCE DIRECTIVES: Discussed:  yes On File: no Materials Provided: yes  Immunization status:  Immunization History  Administered Date(s) Administered  . Fluad Quad(high Dose 65+) 05/29/2019  . Influenza,inj,Quad PF,6+ Mos 06/28/2014, 04/29/2015, 07/13/2016, 05/31/2017, 06/11/2018  . Pneumococcal Conjugate-13 04/29/2015, 03/25/2018  . Pneumococcal Polysaccharide-23 04/06/2014, 05/29/2019  . Td 03/30/2016  . Tdap 12/21/2005  . Zoster 11/25/2015     There are no preventive care reminders to display for this patient.   Functional Status Survey: Is the patient deaf or have difficulty hearing?: No Does the patient have difficulty seeing, even when wearing glasses/contacts?: No Does the patient have difficulty concentrating, remembering, or making decisions?: No Does the patient have difficulty walking or climbing stairs?: No Does the patient have difficulty dressing or bathing?: No Does the patient have difficulty doing errands alone such as visiting a doctor's office or shopping?: No   6CIT Screen 10/25/2019  What Year? 0 points  What month? 0 points  What time? 0 points  Count back from 20 0 points  Months in reverse 0 points  Repeat phrase 0 points  Total  Score 0        Clinical Support from 10/25/2019 in [facility] at [place]  AUDIT-C Score  6       Home Environment:    Marital status:  Married.    Lives at home with wife No scattered rugs No grab bars  Adequate lighting No trouble climbing stairs recently retired officially Walking every day   Patient Active Problem List   Diagnosis Date Noted  . Morning headache 09/11/2019  . NDPH (new daily persistent headache) 07/12/2019  . Hypertension associated with diabetes ([place]) 05/29/2019  . Gastroesophageal reflux disease without esophagitis 03/13/2017  . Dyslipidemia associated with type 2 diabetes mellitus ([place]) 03/13/2017     Past Medical History:  Diagnosis Date  . Diabetes mellitus without complication ([place])   . GERD (gastroesophageal reflux disease)   . Hyperlipidemia   . Hypertension      History reviewed. No pertinent surgical history.   Family History  Problem Relation Age of Onset  . Cancer Mother        bone marrow cancer  . Cancer Father 49       brain cancer  . Migraines Neg Hx   . Headache Neg Hx      Social History   Socioeconomic History  . Marital status: Married    Spouse name: Not on file  . Number of children: 0  . Years of education: Not on file  . Highest education level: High school graduate  Occupational History  . Occupation: Sales  Tobacco Use  . Smoking status: Former Smoker    Types: Cigarettes    Quit date: 1985    Years since  quitting: 36.1  . Smokeless tobacco: Never Used  Substance and Sexual Activity  . Alcohol use: Yes    Alcohol/week: 7.0 standard drinks    Types: 7 Cans of beer per week    Comment: 1 beer with dinner usually daily  . Drug use: No  . Sexual activity: Yes  Other Topics Concern  . Not on file  Social History Narrative   Marital status:  Married.             Children:   none      Lives:      Employment:  Press photographer      Tobacco:  none      Alcohol:  About 7 drinks per week      Drugs:    none      Exercise:      Seatbelt:      Education: [employer].      Lives at home with wife   Right handed   Caffeine: 2 cups/day   Social Determinants of Health   Financial Resource Strain:   . Difficulty of Paying Living Expenses: Not on file  Food Insecurity:   . Worried About Charity fundraiser in the Last Year: Not on file  . Ran Out of Food in the Last Year: Not on file  Transportation Needs:   . Lack of Transportation (Medical): Not on file  . Lack of Transportation (Non-Medical): Not on file  Physical Activity:   . Days of Exercise per Week: Not on file  . Minutes of Exercise per Session: Not on file  Stress:   . Feeling of Stress : Not on file  Social Connections:   . Frequency of Communication with Friends and Family: Not on file  . Frequency of Social Gatherings with Friends and Family: Not on file  . Attends Religious Services: Not on file  . Active Member of Clubs or Organizations: Not on file  . Attends Archivist Meetings: Not on file  . Marital Status: Not on file  Intimate Partner Violence:   . Fear of Current or Ex-Partner: Not on file  . Emotionally Abused: Not on file  . Physically Abused: Not on file  . Sexually Abused: Not on file     Allergies  Allergen Reactions  . Erythromycin Itching  . Levaquin [Levofloxacin In D5w]     Light sensitivity      Prior to Admission medications   Medication Sig Start Date End Date Taking? Authorizing Provider  amLODipine (NORVASC) 5 MG tablet Take 1 tablet (5 mg total) by mouth daily. 06/26/19  Yes Sagardia, Ines Bloomer, MD  fluocinolone (VANOS) 0.01 % cream Apply topically 2 (two) times daily. 01/03/19  Yes Sagardia, Ines Bloomer, MD  glipiZIDE (GLUCOTROL) 5 MG tablet TAKE 1 TABLET(5 MG) BY MOUTH DAILY BEFORE BREAKFAST 05/29/19  Yes Sagardia, Ines Bloomer, MD  metFORMIN (GLUCOPHAGE-XR) 500 MG 24 hr tablet Take 1 tablet (500 mg total) by mouth daily at 8 pm. 10/02/19  Yes Sagardia, Ines Bloomer, MD    triamcinolone cream (KENALOG) 0.1 % Apply 1 application topically 2 (two) times daily.   Yes [provider]  UNABLE TO FIND Take 500 mg by mouth daily. Med Name: Cinsulin Cinnamon   Yes [provider]  cyclobenzaprine (FLEXERIL) 10 MG tablet Take 0.5-1 tablets (5-10 mg total) by mouth at bedtime. Patient not taking: Reported on 10/25/2019 09/11/19   Melvenia Beam, MD     Depression screen [facility] [place] 2/9 10/25/2019  10/05/2019 05/29/2019 03/16/2019 02/16/2019  Decreased Interest 0 0 0 0 0  Down, Depressed, Hopeless 0 0 0 0 0  PHQ - 2 Score 0 0 0 0 0     Fall Risk  10/25/2019 10/05/2019 05/29/2019 03/16/2019 02/16/2019  Falls in the past year? 0 0 0 0 1  Comment - - - - -  Number falls in past yr: 0 0 - 0 0  Injury with Fall? 0 0 - 0 1  Comment - - - - left pinky finger  Risk Factor Category  - - - - -  Follow up Falls evaluation completed;Education provided Falls evaluation completed Falls evaluation completed Falls evaluation completed Falls evaluation completed      PHYSICAL EXAM: BP 140/81 Comment: taken form a previous visit  Ht 5\' 6"  (1.676 m)   Wt 206 lb (93.4 kg)   BMI 33.25 kg/m    Wt Readings from Last 3 Encounters:  10/25/19 206 lb (93.4 kg)  10/05/19 206 lb (93.4 kg)  09/11/19 207 lb (93.9 kg)       Education/Counseling provided regarding diet and exercise, prevention of chronic diseases, smoking/tobacco cessation, if applicable, and reviewed "Covered Medicare Preventive Services."

## 2019-10-25 NOTE — Patient Instructions (Addendum)
Thank you for taking time to come for your Medicare Wellness Visit. I appreciate your ongoing commitment to your health goals. Please review the following plan we discussed and let me know if I can assist you in the future.  Leroy Kennedy LPN   Preventive Care 59 Years and Older, Male Preventive care refers to lifestyle choices and visits with your health care provider that can promote health and wellness. This includes:  A yearly physical exam. This is also called an annual well check.  Regular dental and eye exams.  Immunizations.  Screening for certain conditions.  Healthy lifestyle choices, such as diet and exercise. What can I expect for my preventive care visit? Physical exam Your health care provider will check:  Height and weight. These may be used to calculate body mass index (BMI), which is a measurement that tells if you are at a healthy weight.  Heart rate and blood pressure.  Your skin for abnormal spots. Counseling Your health care provider may ask you questions about:  Alcohol, tobacco, and drug use.  Emotional well-being.  Home and relationship well-being.  Sexual activity.  Eating habits.  History of falls.  Memory and ability to understand (cognition).  Work and work Statistician. What immunizations do I need?  Influenza (flu) vaccine  This is recommended every year. Tetanus, diphtheria, and pertussis (Tdap) vaccine  You may need a Td booster every 10 years. Varicella (chickenpox) vaccine  You may need this vaccine if you have not already been vaccinated. Zoster (shingles) vaccine  You may need this after age 63. Pneumococcal conjugate (PCV13) vaccine  One dose is recommended after age 10. Pneumococcal polysaccharide (PPSV23) vaccine  One dose is recommended after age 22. Measles, mumps, and rubella (MMR) vaccine  You may need at least one dose of MMR if you were born in 1957 or later. You may also need a second dose. Meningococcal  conjugate (MenACWY) vaccine  You may need this if you have certain conditions. Hepatitis A vaccine  You may need this if you have certain conditions or if you travel or work in places where you may be exposed to hepatitis A. Hepatitis B vaccine  You may need this if you have certain conditions or if you travel or work in places where you may be exposed to hepatitis B. Haemophilus influenzae type b (Hib) vaccine  You may need this if you have certain conditions. You may receive vaccines as individual doses or as more than one vaccine together in one shot (combination vaccines). Talk with your health care provider about the risks and benefits of combination vaccines. What tests do I need? Blood tests  Lipid and cholesterol levels. These may be checked every 5 years, or more frequently depending on your overall health.  Hepatitis C test.  Hepatitis B test. Screening  Lung cancer screening. You may have this screening every year starting at age 61 if you have a 30-pack-year history of smoking and currently smoke or have quit within the past 15 years.  Colorectal cancer screening. All adults should have this screening starting at age 23 and continuing until age 47. Your health care provider may recommend screening at age 1 if you are at increased risk. You will have tests every 1-10 years, depending on your results and the type of screening test.  Prostate cancer screening. Recommendations will vary depending on your family history and other risks.  Diabetes screening. This is done by checking your blood sugar (glucose) after you have not eaten  for a while (fasting). You may have this done every 1-3 years.  Abdominal aortic aneurysm (AAA) screening. You may need this if you are a current or former smoker.  Sexually transmitted disease (STD) testing. Follow these instructions at home: Eating and drinking  Eat a diet that includes fresh fruits and vegetables, whole grains, lean  protein, and low-fat dairy products. Limit your intake of foods with high amounts of sugar, saturated fats, and salt.  Take vitamin and mineral supplements as recommended by your health care provider.  Do not drink alcohol if your health care provider tells you not to drink.  If you drink alcohol: ? Limit how much you have to 0-2 drinks a day. ? Be aware of how much alcohol is in your drink. In the [place] one drink equals one 12 oz bottle of beer (355 mL), one 5 oz glass of wine (148 mL), or one 1 oz glass of hard liquor (44 mL). Lifestyle  Take daily care of your teeth and gums.  Stay active. Exercise for at least 30 minutes on 5 or more days each week.  Do not use any products that contain nicotine or tobacco, such as cigarettes, e-cigarettes, and chewing tobacco. If you need help quitting, ask your health care provider.  If you are sexually active, practice safe sex. Use a condom or other form of protection to prevent STIs (sexually transmitted infections).  Talk with your health care provider about taking a low-dose aspirin or statin. What's next?  Visit your health care provider once a year for a well check visit.  Ask your health care provider how often you should have your eyes and teeth checked.  Stay up to date on all vaccines. This information is not intended to replace advice given to you by your health care provider. Make sure you discuss any questions you have with your health care provider. Document Revised: 08/11/2018 Document Reviewed: 08/11/2018 [employer]vier Patient Education  2020 [employer].

## 2019-11-09 DIAGNOSIS — Z23 Encounter for immunization: Secondary | ICD-10-CM | POA: Diagnosis not present

## 2019-11-14 DIAGNOSIS — H60392 Other infective otitis externa, left ear: Secondary | ICD-10-CM | POA: Diagnosis not present

## 2019-11-27 ENCOUNTER — Other Ambulatory Visit: Payer: Self-pay

## 2019-11-27 ENCOUNTER — Ambulatory Visit (INDEPENDENT_AMBULATORY_CARE_PROVIDER_SITE_OTHER): Payer: Medicare Other | Admitting: Emergency Medicine

## 2019-11-27 ENCOUNTER — Encounter: Payer: Self-pay | Admitting: Emergency Medicine

## 2019-11-27 VITALS — BP 131/72 | HR 84 | Temp 97.6°F | Resp 14 | Ht 66.0 in | Wt 203.0 lb

## 2019-11-27 DIAGNOSIS — R61 Generalized hyperhidrosis: Secondary | ICD-10-CM

## 2019-11-27 DIAGNOSIS — E785 Hyperlipidemia, unspecified: Secondary | ICD-10-CM

## 2019-11-27 DIAGNOSIS — E1159 Type 2 diabetes mellitus with other circulatory complications: Secondary | ICD-10-CM

## 2019-11-27 DIAGNOSIS — E1169 Type 2 diabetes mellitus with other specified complication: Secondary | ICD-10-CM | POA: Diagnosis not present

## 2019-11-27 DIAGNOSIS — I152 Hypertension secondary to endocrine disorders: Secondary | ICD-10-CM

## 2019-11-27 DIAGNOSIS — I10 Essential (primary) hypertension: Secondary | ICD-10-CM | POA: Diagnosis not present

## 2019-11-27 DIAGNOSIS — M79675 Pain in left toe(s): Secondary | ICD-10-CM

## 2019-11-27 LAB — POCT URINALYSIS DIP (MANUAL ENTRY)
Bilirubin, UA: NEGATIVE
Blood, UA: NEGATIVE
Glucose, UA: NEGATIVE mg/dL
Leukocytes, UA: NEGATIVE
Nitrite, UA: NEGATIVE
Spec Grav, UA: >=1.03 — AB (ref 1.010–1.025)
Urobilinogen, UA: 0.2 E.U./dL
pH, UA: 5 (ref 5.0–8.0)

## 2019-11-27 NOTE — Progress Notes (Signed)
Aaron [name] 69 y.o.   Chief Complaint  Patient presents with  . Diabetes    Pt has taken some sugars at home usually around 100-125, has worked on staying away from carbs and sugars  . Hypertension    pt denies lightheaded and dizy, has had some headaches has seen neurology headaches have subsided for the most part.  . Knee Pain    pt states much better doing fairly well.    HISTORY OF PRESENT ILLNESS: This is a 69 y.o. male with history of hypertension and diabetes here for follow-up. 1.  Hypertension: On amlodipine 5 mg daily. 2.  Diabetes: On Metformin and glipizide.  Blood glucose at home between 100-125. Has 2 complaints today: Night sweats for several months, 3 to 4 months.  Normal temperatures.  No fever.  No other associated symptoms.  No recent traveling.  No flulike symptoms.  No diarrhea.  No GU or GI symptoms.  No coughing.  No rashes or lymph node swelling. Left big toe pain for 1 month without injuries.  Possible gout. No other complaints or medical concerns today. Compliant with medications.  HPI   Prior to Admission medications   Medication Sig Start Date End Date Taking? Authorizing Provider  amLODipine (NORVASC) 5 MG tablet Take 1 tablet (5 mg total) by mouth daily. 06/26/19   [name], MD  fluocinolone (VANOS) 0.01 % cream Apply topically 2 (two) times daily. 01/03/19   [name], MD  glipiZIDE (GLUCOTROL) 5 MG tablet TAKE 1 TABLET(5 MG) BY MOUTH DAILY BEFORE BREAKFAST 05/29/19   [name], MD  metFORMIN (GLUCOPHAGE-XR) 500 MG 24 hr tablet Take 1 tablet (500 mg total) by mouth daily at 8 pm. 10/02/19   [name], MD  triamcinolone cream (KENALOG) 0.1 % Apply 1 application topically 2 (two) times daily.    [provider]  UNABLE TO FIND Take 500 mg by mouth daily. Med Name: Cinsulin Cinnamon    [provider]    Allergies  Allergen Reactions  . Erythromycin Itching  . Levaquin [Levofloxacin  In D5w]     Light sensitivity     Patient Active Problem List   Diagnosis Date Noted  . Morning headache 09/11/2019  . NDPH (new daily persistent headache) 07/12/2019  . Hypertension associated with diabetes ([place]) 05/29/2019  . Gastroesophageal reflux disease without esophagitis 03/13/2017  . Dyslipidemia associated with type 2 diabetes mellitus ([place]) 03/13/2017    Past Medical History:  Diagnosis Date  . Diabetes mellitus without complication ([place])   . GERD (gastroesophageal reflux disease)   . Hyperlipidemia   . Hypertension     History reviewed. No pertinent surgical history.  Social History   Socioeconomic History  . Marital status: Married    Spouse name: Not on file  . Number of children: 0  . Years of education: Not on file  . Highest education level: High school graduate  Occupational History  . Occupation: Sales  Tobacco Use  . Smoking status: Former Smoker    Types: Cigarettes    Quit date: 1985    Years since quitting: 36.2  . Smokeless tobacco: Never Used  Substance and Sexual Activity  . Alcohol use: Yes    Alcohol/week: 7.0 standard drinks    Types: 7 Cans of beer per week    Comment: 1 beer with dinner usually daily  . Drug use: No  . Sexual activity: Yes  Other Topics Concern  . Not on file  Social History Narrative   Marital status:  Married.             Children:   none      Lives:      Employment:  Press photographer      Tobacco:  none      Alcohol:  About 7 drinks per week      Drugs:   none      Exercise:      Seatbelt:      Education: [employer].      Lives at home with wife   Right handed   Caffeine: 2 cups/day   Social Determinants of Health   Financial Resource Strain:   . Difficulty of Paying Living Expenses:   Food Insecurity:   . Worried About Charity fundraiser in the Last Year:   . Arboriculturist in the Last Year:   Transportation Needs:   . Film/video editor (Medical):   Marland Kitchen Lack of Transportation (Non-Medical):     Physical Activity:   . Days of Exercise per Week:   . Minutes of Exercise per Session:   Stress:   . Feeling of Stress :   Social Connections:   . Frequency of Communication with Friends and Family:   . Frequency of Social Gatherings with Friends and Family:   . Attends Religious Services:   . Active Member of Clubs or Organizations:   . Attends Archivist Meetings:   Marland Kitchen Marital Status:   Intimate Partner Violence:   . Fear of Current or Ex-Partner:   . Emotionally Abused:   Marland Kitchen Physically Abused:   . Sexually Abused:     Family History  Problem Relation Age of Onset  . Cancer Mother        bone marrow cancer  . Cancer Father 71       brain cancer  . Migraines Neg Hx   . Headache Neg Hx      Review of Systems  Constitutional: Negative.  Negative for chills and fever.       Night sweats  HENT: Negative.  Negative for congestion and sore throat.   Eyes: Negative.   Respiratory: Negative.  Negative for cough.   Cardiovascular: Negative.  Negative for chest pain, palpitations and leg swelling.  Gastrointestinal: Negative.  Negative for abdominal pain, blood in stool, diarrhea, melena, nausea and vomiting.  Genitourinary: Negative.  Negative for dysuria and hematuria.  Musculoskeletal: Positive for joint pain (Left big toe). Negative for myalgias and neck pain.  Skin: Negative.  Negative for rash.  Neurological: Negative for dizziness and headaches.  All other systems reviewed and are negative.  Vitals:   11/27/19 0854  BP: 131/72  Pulse: 84  Resp: 14  Temp: 97.6 F (36.4 C)  SpO2: 95%     Physical Exam Vitals reviewed.  Constitutional:      Appearance: Normal appearance.  HENT:     Head: Normocephalic.     Mouth/Throat:     Mouth: Mucous membranes are moist.     Pharynx: Oropharynx is clear.  Eyes:     Extraocular Movements: Extraocular movements intact.     Conjunctiva/sclera: Conjunctivae normal.     Pupils: Pupils are equal, round, and  reactive to light.  Cardiovascular:     Rate and Rhythm: Normal rate and regular rhythm.     Pulses: Normal pulses.     Heart sounds: Normal heart sounds.  Pulmonary:     Effort: Pulmonary effort  is normal.     Breath sounds: Normal breath sounds.  Abdominal:     General: Bowel sounds are normal. There is no distension.     Palpations: Abdomen is soft.     Tenderness: There is no abdominal tenderness.  Musculoskeletal:        General: Normal range of motion.     Cervical back: Normal range of motion and neck supple.     Comments: Left foot: Mild tenderness and mild erythema to first MTP joint  Lymphadenopathy:     Cervical: No cervical adenopathy.     Right cervical: No superficial or posterior cervical adenopathy.    Left cervical: No superficial or posterior cervical adenopathy.     Upper Body:     Right upper body: No supraclavicular or axillary adenopathy.     Left upper body: No supraclavicular or axillary adenopathy.  Skin:    General: Skin is warm and dry.     Capillary Refill: Capillary refill takes less than 2 seconds.  Neurological:     General: No focal deficit present.     Mental Status: He is alert and oriented to person, place, and time.  Psychiatric:        Mood and Affect: Mood normal.        Behavior: Behavior normal.    Results for orders placed or performed in visit on 11/27/19 (from the past 24 hour(s))  POCT urinalysis dipstick     Status: Abnormal   Collection Time: 11/27/19 10:01 AM  Result Value Ref Range   Color, UA yellow yellow   Clarity, UA clear clear   Glucose, UA negative negative mg/dL   Bilirubin, UA negative negative   Ketones, POC UA trace (5) (A) negative mg/dL   Spec Grav, UA >=1.030 (A) 1.010 - 1.025   Blood, UA negative negative   pH, UA 5.0 5.0 - 8.0   Protein Ur, POC trace (A) negative mg/dL   Urobilinogen, UA 0.2 0.2 or 1.0 E.U./dL   Nitrite, UA Negative Negative   Leukocytes, UA Negative Negative   A total of 30 minutes was  spent with the patient, greater than 50% of which was in counseling/coordination of care regarding chronic medical problems and differential diagnosis of night sweats, review of most recent office visit notes, review of all medications, review of most recent blood work results, need for diagnostic work-up, prognosis and need for follow-up.    ASSESSMENT & PLAN: Dagan was seen today for diabetes, hypertension and knee pain.  Diagnoses and all orders for this visit:  Hypertension associated with diabetes ([place]) -     CMP14+EGFR -     Hemoglobin A1c  Dyslipidemia associated with type 2 diabetes mellitus ([place]) -     Lipid panel  Night sweats -     POCT urinalysis dipstick -     CBC with Differential/Platelet  Toe pain, left -     Uric Acid    Patient Instructions       If you have lab work done today you will be contacted with your lab results within the next 2 weeks.  If you have not heard from [place] then please contact us. The fastest way to get your results is to register for My Chart.   IF you received an x-ray today, you will receive an invoice from [facility]. Please contact [facility] at [phone] with questions or concerns regarding your invoice.   IF you received labwork today, you will receive an invoice  from [facility]. Please contact [facility] at [phone] with questions or concerns regarding your invoice.   Our billing staff will not be able to assist you with questions regarding bills from these companies.  You will be contacted with the lab results as soon as they are available. The fastest way to get your results is to activate your My Chart account. Instructions are located on the last page of this paperwork. If you have not heard from [place] regarding the results in 2 weeks, please contact this office.     Health Maintenance After Age 61 After age 69, you are at a higher risk for certain long-term diseases and infections as well as injuries  from falls. Falls are a major cause of broken bones and head injuries in people who are older than age 49. Getting regular preventive care can help to keep you healthy and well. Preventive care includes getting regular testing and making lifestyle changes as recommended by your health care provider. Talk with your health care provider about:  Which screenings and tests you should have. A screening is a test that checks for a disease when you have no symptoms.  A diet and exercise plan that is right for you. What should I know about screenings and tests to prevent falls? Screening and testing are the best ways to find a health problem early. Early diagnosis and treatment give you the best chance of managing medical conditions that are common after age 30. Certain conditions and lifestyle choices may make you more likely to have a fall. Your health care provider may recommend:  Regular vision checks. Poor vision and conditions such as cataracts can make you more likely to have a fall. If you wear glasses, make sure to get your prescription updated if your vision changes.  Medicine review. Work with your health care provider to regularly review all of the medicines you are taking, including over-the-counter medicines. Ask your health care provider about any side effects that may make you more likely to have a fall. Tell your health care provider if any medicines that you take make you feel dizzy or sleepy.  Osteoporosis screening. Osteoporosis is a condition that causes the bones to get weaker. This can make the bones weak and cause them to break more easily.  Blood pressure screening. Blood pressure changes and medicines to control blood pressure can make you feel dizzy.  Strength and balance checks. Your health care provider may recommend certain tests to check your strength and balance while standing, walking, or changing positions.  Foot health exam. Foot pain and numbness, as well as not wearing  proper footwear, can make you more likely to have a fall.  Depression screening. You may be more likely to have a fall if you have a fear of falling, feel emotionally low, or feel unable to do activities that you used to do.  Alcohol use screening. Using too much alcohol can affect your balance and may make you more likely to have a fall. What actions can I take to lower my risk of falls? General instructions  Talk with your health care provider about your risks for falling. Tell your health care provider if: ? You fall. Be sure to tell your health care provider about all falls, even ones that seem minor. ? You feel dizzy, sleepy, or off-balance.  Take over-the-counter and prescription medicines only as told by your health care provider. These include any supplements.  Eat a healthy diet and maintain a healthy weight.  A healthy diet includes low-fat dairy products, low-fat (lean) meats, and fiber from whole grains, beans, and lots of fruits and vegetables. Home safety  Remove any tripping hazards, such as rugs, cords, and clutter.  Install safety equipment such as grab bars in bathrooms and safety rails on stairs.  Keep rooms and walkways well-lit. Activity   Follow a regular exercise program to stay fit. This will help you maintain your balance. Ask your health care provider what types of exercise are appropriate for you.  If you need a cane or walker, use it as recommended by your health care provider.  Wear supportive shoes that have nonskid soles. Lifestyle  Do not drink alcohol if your health care provider tells you not to drink.  If you drink alcohol, limit how much you have: ? 0-1 drink a day for women. ? 0-2 drinks a day for men.  Be aware of how much alcohol is in your drink. In the [place] one drink equals one typical bottle of beer (12 oz), one-half glass of wine (5 oz), or one shot of hard liquor (1 oz).  Do not use any products that contain nicotine or tobacco,  such as cigarettes and e-cigarettes. If you need help quitting, ask your health care provider. Summary  Having a healthy lifestyle and getting preventive care can help to protect your health and wellness after age 31.  Screening and testing are the best way to find a health problem early and help you avoid having a fall. Early diagnosis and treatment give you the best chance for managing medical conditions that are more common for people who are older than age 55.  Falls are a major cause of broken bones and head injuries in people who are older than age 31. Take precautions to prevent a fall at home.  Work with your health care provider to learn what changes you can make to improve your health and wellness and to prevent falls. This information is not intended to replace advice given to you by your health care provider. Make sure you discuss any questions you have with your health care provider. Document Revised: 12/08/2018 Document Reviewed: 06/30/2017 Elsevier Patient Education  [place].  Hypertension, Adult High blood pressure (hypertension) is when the force of blood pumping through the arteries is too strong. The arteries are the blood vessels that carry blood from the heart throughout the body. Hypertension forces the heart to work harder to pump blood and may cause arteries to become narrow or stiff. Untreated or uncontrolled hypertension can cause a heart attack, heart failure, a stroke, kidney disease, and other problems. A blood pressure reading consists of a higher number over a lower number. Ideally, your blood pressure should be below 120/80. The first ("top") number is called the systolic pressure. It is a measure of the pressure in your arteries as your heart beats. The second ("bottom") number is called the diastolic pressure. It is a measure of the pressure in your arteries as the heart relaxes. What are the causes? The exact cause of this condition is not known. There  are some conditions that result in or are related to high blood pressure. What increases the risk? Some risk factors for high blood pressure are under your control. The following factors may make you more likely to develop this condition:  Smoking.  Having type 2 diabetes mellitus, high cholesterol, or both.  Not getting enough exercise or physical activity.  Being overweight.  Having too much fat,  sugar, calories, or salt (sodium) in your diet.  Drinking too much alcohol. Some risk factors for high blood pressure may be difficult or impossible to change. Some of these factors include:  Having chronic kidney disease.  Having a family history of high blood pressure.  Age. Risk increases with age.  Race. You may be at higher risk if you are African American.  Gender. Men are at higher risk than women before age 2. After age 59, women are at higher risk than men.  Having obstructive sleep apnea.  Stress. What are the signs or symptoms? High blood pressure may not cause symptoms. Very high blood pressure (hypertensive crisis) may cause:  Headache.  Anxiety.  Shortness of breath.  Nosebleed.  Nausea and vomiting.  Vision changes.  Severe chest pain.  Seizures. How is this diagnosed? This condition is diagnosed by measuring your blood pressure while you are seated, with your arm resting on a flat surface, your legs uncrossed, and your feet flat on the floor. The cuff of the blood pressure monitor will be placed directly against the skin of your upper arm at the level of your heart. It should be measured at least twice using the same arm. Certain conditions can cause a difference in blood pressure between your right and left arms. Certain factors can cause blood pressure readings to be lower or higher than normal for a short period of time:  When your blood pressure is higher when you are in a health care provider's office than when you are at home, this is called white  coat hypertension. Most people with this condition do not need medicines.  When your blood pressure is higher at home than when you are in a health care provider's office, this is called masked hypertension. Most people with this condition may need medicines to control blood pressure. If you have a high blood pressure reading during one visit or you have normal blood pressure with other risk factors, you may be asked to:  Return on a different day to have your blood pressure checked again.  Monitor your blood pressure at home for 1 week or longer. If you are diagnosed with hypertension, you may have other blood or imaging tests to help your health care provider understand your overall risk for other conditions. How is this treated? This condition is treated by making healthy lifestyle changes, such as eating healthy foods, exercising more, and reducing your alcohol intake. Your health care provider may prescribe medicine if lifestyle changes are not enough to get your blood pressure under control, and if:  Your systolic blood pressure is above 130.  Your diastolic blood pressure is above 80. Your personal target blood pressure may vary depending on your medical conditions, your age, and other factors. Follow these instructions at home: Eating and drinking   Eat a diet that is high in fiber and potassium, and low in sodium, added sugar, and fat. An example eating plan is called the DASH (Dietary Approaches to Stop Hypertension) diet. To eat this way: ? Eat plenty of fresh fruits and vegetables. Try to fill one half of your plate at each meal with fruits and vegetables. ? Eat whole grains, such as whole-wheat pasta, brown rice, or whole-grain bread. Fill about one fourth of your plate with whole grains. ? Eat or drink low-fat dairy products, such as skim milk or low-fat yogurt. ? Avoid fatty cuts of meat, processed or cured meats, and poultry with skin. Fill about one fourth of  your plate with  lean proteins, such as fish, chicken without skin, beans, eggs, or tofu. ? Avoid pre-made and processed foods. These tend to be higher in sodium, added sugar, and fat.  Reduce your daily sodium intake. Most people with hypertension should eat less than 1,500 mg of sodium a day.  Do not drink alcohol if: ? Your health care provider tells you not to drink. ? You are pregnant, may be pregnant, or are planning to become pregnant.  If you drink alcohol: ? Limit how much you use to:  0-1 drink a day for women.  0-2 drinks a day for men. ? Be aware of how much alcohol is in your drink. In the [place] one drink equals one 12 oz bottle of beer (355 mL), one 5 oz glass of wine (148 mL), or one 1 oz glass of hard liquor (44 mL). Lifestyle   Work with your health care provider to maintain a healthy body weight or to lose weight. Ask what an ideal weight is for you.  Get at least 30 minutes of exercise most days of the week. Activities may include walking, swimming, or biking.  Include exercise to strengthen your muscles (resistance exercise), such as Pilates or lifting weights, as part of your weekly exercise routine. Try to do these types of exercises for 30 minutes at least 3 days a week.  Do not use any products that contain nicotine or tobacco, such as cigarettes, e-cigarettes, and chewing tobacco. If you need help quitting, ask your health care provider.  Monitor your blood pressure at home as told by your health care provider.  Keep all follow-up visits as told by your health care provider. This is important. Medicines  Take over-the-counter and prescription medicines only as told by your health care provider. Follow directions carefully. Blood pressure medicines must be taken as prescribed.  Do not skip doses of blood pressure medicine. Doing this puts you at risk for problems and can make the medicine less effective.  Ask your health care provider about side effects or reactions to  medicines that you should watch for. Contact a health care provider if you:  Think you are having a reaction to a medicine you are taking.  Have headaches that keep coming back (recurring).  Feel dizzy.  Have swelling in your ankles.  Have trouble with your vision. Get help right away if you:  Develop a severe headache or confusion.  Have unusual weakness or numbness.  Feel faint.  Have severe pain in your chest or abdomen.  Vomit repeatedly.  Have trouble breathing. Summary  Hypertension is when the force of blood pumping through your arteries is too strong. If this condition is not controlled, it may put you at risk for serious complications.  Your personal target blood pressure may vary depending on your medical conditions, your age, and other factors. For most people, a normal blood pressure is less than 120/80.  Hypertension is treated with lifestyle changes, medicines, or a combination of both. Lifestyle changes include losing weight, eating a healthy, low-sodium diet, exercising more, and limiting alcohol. This information is not intended to replace advice given to you by your health care provider. Make sure you discuss any questions you have with your health care provider. Document Revised: 04/27/2018 Document Reviewed: 04/27/2018 Elsevier Patient Education  2020 [employer]      Agustina Caroli, MD [facility]

## 2019-11-27 NOTE — Patient Instructions (Addendum)
   If you have lab work done today you will be contacted with your lab results within the next 2 weeks.  If you have not heard from us then please contact us. The fastest way to get your results is to register for My Chart.   IF you received an x-ray today, you will receive an invoice from [facility]. Please contact [facility] at [phone] with questions or concerns regarding your invoice.   IF you received labwork today, you will receive an invoice from [facility]. Please contact [facility] at [phone] with questions or concerns regarding your invoice.   Our billing staff will not be able to assist you with questions regarding bills from these companies.  You will be contacted with the lab results as soon as they are available. The fastest way to get your results is to activate your My Chart account. Instructions are located on the last page of this paperwork. If you have not heard from us regarding the results in 2 weeks, please contact this office.     Health Maintenance After Age 65 After age 65, you are at a higher risk for certain long-term diseases and infections as well as injuries from falls. Falls are a major cause of broken bones and head injuries in people who are older than age 65. Getting regular preventive care can help to keep you healthy and well. Preventive care includes getting regular testing and making lifestyle changes as recommended by your health care provider. Talk with your health care provider about:  Which screenings and tests you should have. A screening is a test that checks for a disease when you have no symptoms.  A diet and exercise plan that is right for you. What should I know about screenings and tests to prevent falls? Screening and testing are the best ways to find a health problem early. Early diagnosis and treatment give you the best chance of managing medical conditions that are common after age 65. Certain conditions and  lifestyle choices may make you more likely to have a fall. Your health care provider may recommend:  Regular vision checks. Poor vision and conditions such as cataracts can make you more likely to have a fall. If you wear glasses, make sure to get your prescription updated if your vision changes.  Medicine review. Work with your health care provider to regularly review all of the medicines you are taking, including over-the-counter medicines. Ask your health care provider about any side effects that may make you more likely to have a fall. Tell your health care provider if any medicines that you take make you feel dizzy or sleepy.  Osteoporosis screening. Osteoporosis is a condition that causes the bones to get weaker. This can make the bones weak and cause them to break more easily.  Blood pressure screening. Blood pressure changes and medicines to control blood pressure can make you feel dizzy.  Strength and balance checks. Your health care provider may recommend certain tests to check your strength and balance while standing, walking, or changing positions.  Foot health exam. Foot pain and numbness, as well as not wearing proper footwear, can make you more likely to have a fall.  Depression screening. You may be more likely to have a fall if you have a fear of falling, feel emotionally low, or feel unable to do activities that you used to do.  Alcohol use screening. Using too much alcohol can affect your balance and may make you more likely to   have a fall. What actions can I take to lower my risk of falls? General instructions  Talk with your health care provider about your risks for falling. Tell your health care provider if: ? You fall. Be sure to tell your health care provider about all falls, even ones that seem minor. ? You feel dizzy, sleepy, or off-balance.  Take over-the-counter and prescription medicines only as told by your health care provider. These include any  supplements.  Eat a healthy diet and maintain a healthy weight. A healthy diet includes low-fat dairy products, low-fat (lean) meats, and fiber from whole grains, beans, and lots of fruits and vegetables. Home safety  Remove any tripping hazards, such as rugs, cords, and clutter.  Install safety equipment such as grab bars in bathrooms and safety rails on stairs.  Keep rooms and walkways well-lit. Activity   Follow a regular exercise program to stay fit. This will help you maintain your balance. Ask your health care provider what types of exercise are appropriate for you.  If you need a cane or walker, use it as recommended by your health care provider.  Wear supportive shoes that have nonskid soles. Lifestyle  Do not drink alcohol if your health care provider tells you not to drink.  If you drink alcohol, limit how much you have: ? 0-1 drink a day for women. ? 0-2 drinks a day for men.  Be aware of how much alcohol is in your drink. In the [place] one drink equals one typical bottle of beer (12 oz), one-half glass of wine (5 oz), or one shot of hard liquor (1 oz).  Do not use any products that contain nicotine or tobacco, such as cigarettes and e-cigarettes. If you need help quitting, ask your health care provider. Summary  Having a healthy lifestyle and getting preventive care can help to protect your health and wellness after age 65.  Screening and testing are the best way to find a health problem early and help you avoid having a fall. Early diagnosis and treatment give you the best chance for managing medical conditions that are more common for people who are older than age 65.  Falls are a major cause of broken bones and head injuries in people who are older than age 65. Take precautions to prevent a fall at home.  Work with your health care provider to learn what changes you can make to improve your health and wellness and to prevent falls. This information is not intended  to replace advice given to you by your health care provider. Make sure you discuss any questions you have with your health care provider. Document Revised: 12/08/2018 Document Reviewed: 06/30/2017 Elsevier Patient Education  2020 Elsevier Inc.  Hypertension, Adult High blood pressure (hypertension) is when the force of blood pumping through the arteries is too strong. The arteries are the blood vessels that carry blood from the heart throughout the body. Hypertension forces the heart to work harder to pump blood and may cause arteries to become narrow or stiff. Untreated or uncontrolled hypertension can cause a heart attack, heart failure, a stroke, kidney disease, and other problems. A blood pressure reading consists of a higher number over a lower number. Ideally, your blood pressure should be below 120/80. The first ("top") number is called the systolic pressure. It is a measure of the pressure in your arteries as your heart beats. The second ("bottom") number is called the diastolic pressure. It is a measure of the pressure in   your arteries as the heart relaxes. What are the causes? The exact cause of this condition is not known. There are some conditions that result in or are related to high blood pressure. What increases the risk? Some risk factors for high blood pressure are under your control. The following factors may make you more likely to develop this condition:  Smoking.  Having type 2 diabetes mellitus, high cholesterol, or both.  Not getting enough exercise or physical activity.  Being overweight.  Having too much fat, sugar, calories, or salt (sodium) in your diet.  Drinking too much alcohol. Some risk factors for high blood pressure may be difficult or impossible to change. Some of these factors include:  Having chronic kidney disease.  Having a family history of high blood pressure.  Age. Risk increases with age.  Race. You may be at higher risk if you are African  American.  Gender. Men are at higher risk than women before age 45. After age 65, women are at higher risk than men.  Having obstructive sleep apnea.  Stress. What are the signs or symptoms? High blood pressure may not cause symptoms. Very high blood pressure (hypertensive crisis) may cause:  Headache.  Anxiety.  Shortness of breath.  Nosebleed.  Nausea and vomiting.  Vision changes.  Severe chest pain.  Seizures. How is this diagnosed? This condition is diagnosed by measuring your blood pressure while you are seated, with your arm resting on a flat surface, your legs uncrossed, and your feet flat on the floor. The cuff of the blood pressure monitor will be placed directly against the skin of your upper arm at the level of your heart. It should be measured at least twice using the same arm. Certain conditions can cause a difference in blood pressure between your right and left arms. Certain factors can cause blood pressure readings to be lower or higher than normal for a short period of time:  When your blood pressure is higher when you are in a health care provider's office than when you are at home, this is called white coat hypertension. Most people with this condition do not need medicines.  When your blood pressure is higher at home than when you are in a health care provider's office, this is called masked hypertension. Most people with this condition may need medicines to control blood pressure. If you have a high blood pressure reading during one visit or you have normal blood pressure with other risk factors, you may be asked to:  Return on a different day to have your blood pressure checked again.  Monitor your blood pressure at home for 1 week or longer. If you are diagnosed with hypertension, you may have other blood or imaging tests to help your health care provider understand your overall risk for other conditions. How is this treated? This condition is treated by  making healthy lifestyle changes, such as eating healthy foods, exercising more, and reducing your alcohol intake. Your health care provider may prescribe medicine if lifestyle changes are not enough to get your blood pressure under control, and if:  Your systolic blood pressure is above 130.  Your diastolic blood pressure is above 80. Your personal target blood pressure may vary depending on your medical conditions, your age, and other factors. Follow these instructions at home: Eating and drinking   Eat a diet that is high in fiber and potassium, and low in sodium, added sugar, and fat. An example eating plan is called the DASH (  Dietary Approaches to Stop Hypertension) diet. To eat this way: ? Eat plenty of fresh fruits and vegetables. Try to fill one half of your plate at each meal with fruits and vegetables. ? Eat whole grains, such as whole-wheat pasta, brown rice, or whole-grain bread. Fill about one fourth of your plate with whole grains. ? Eat or drink low-fat dairy products, such as skim milk or low-fat yogurt. ? Avoid fatty cuts of meat, processed or cured meats, and poultry with skin. Fill about one fourth of your plate with lean proteins, such as fish, chicken without skin, beans, eggs, or tofu. ? Avoid pre-made and processed foods. These tend to be higher in sodium, added sugar, and fat.  Reduce your daily sodium intake. Most people with hypertension should eat less than 1,500 mg of sodium a day.  Do not drink alcohol if: ? Your health care provider tells you not to drink. ? You are pregnant, may be pregnant, or are planning to become pregnant.  If you drink alcohol: ? Limit how much you use to:  0-1 drink a day for women.  0-2 drinks a day for men. ? Be aware of how much alcohol is in your drink. In the [place] one drink equals one 12 oz bottle of beer (355 mL), one 5 oz glass of wine (148 mL), or one 1 oz glass of hard liquor (44 mL). Lifestyle   Work with your health  care provider to maintain a healthy body weight or to lose weight. Ask what an ideal weight is for you.  Get at least 30 minutes of exercise most days of the week. Activities may include walking, swimming, or biking.  Include exercise to strengthen your muscles (resistance exercise), such as Pilates or lifting weights, as part of your weekly exercise routine. Try to do these types of exercises for 30 minutes at least 3 days a week.  Do not use any products that contain nicotine or tobacco, such as cigarettes, e-cigarettes, and chewing tobacco. If you need help quitting, ask your health care provider.  Monitor your blood pressure at home as told by your health care provider.  Keep all follow-up visits as told by your health care provider. This is important. Medicines  Take over-the-counter and prescription medicines only as told by your health care provider. Follow directions carefully. Blood pressure medicines must be taken as prescribed.  Do not skip doses of blood pressure medicine. Doing this puts you at risk for problems and can make the medicine less effective.  Ask your health care provider about side effects or reactions to medicines that you should watch for. Contact a health care provider if you:  Think you are having a reaction to a medicine you are taking.  Have headaches that keep coming back (recurring).  Feel dizzy.  Have swelling in your ankles.  Have trouble with your vision. Get help right away if you:  Develop a severe headache or confusion.  Have unusual weakness or numbness.  Feel faint.  Have severe pain in your chest or abdomen.  Vomit repeatedly.  Have trouble breathing. Summary  Hypertension is when the force of blood pumping through your arteries is too strong. If this condition is not controlled, it may put you at risk for serious complications.  Your personal target blood pressure may vary depending on your medical conditions, your age, and  other factors. For most people, a normal blood pressure is less than 120/80.  Hypertension is treated with lifestyle changes, medicines,   or a combination of both. Lifestyle changes include losing weight, eating a healthy, low-sodium diet, exercising more, and limiting alcohol. This information is not intended to replace advice given to you by your health care provider. Make sure you discuss any questions you have with your health care provider. Document Revised: 04/27/2018 Document Reviewed: 04/27/2018 Elsevier Patient Education  2020 Elsevier Inc.  

## 2019-11-28 ENCOUNTER — Other Ambulatory Visit: Payer: Self-pay | Admitting: Emergency Medicine

## 2019-11-28 ENCOUNTER — Encounter: Payer: Self-pay | Admitting: Emergency Medicine

## 2019-11-28 DIAGNOSIS — I152 Hypertension secondary to endocrine disorders: Secondary | ICD-10-CM

## 2019-11-28 DIAGNOSIS — E1159 Type 2 diabetes mellitus with other circulatory complications: Secondary | ICD-10-CM

## 2019-11-28 DIAGNOSIS — E785 Hyperlipidemia, unspecified: Secondary | ICD-10-CM

## 2019-11-28 DIAGNOSIS — E1169 Type 2 diabetes mellitus with other specified complication: Secondary | ICD-10-CM

## 2019-11-28 LAB — CBC WITH DIFFERENTIAL/PLATELET
Basophils Absolute: 0 10*3/uL (ref 0.0–0.2)
Basos: 1 %
EOS (ABSOLUTE): 0.1 10*3/uL (ref 0.0–0.4)
Eos: 1 %
Hematocrit: 44.7 % (ref 37.5–51.0)
Hemoglobin: 14.4 g/dL (ref 13.0–17.7)
Immature Grans (Abs): 0 10*3/uL (ref 0.0–0.1)
Immature Granulocytes: 1 %
Lymphocytes Absolute: 1.4 10*3/uL (ref 0.7–3.1)
Lymphs: 26 %
MCH: 27.1 pg (ref 26.6–33.0)
MCHC: 32.2 g/dL (ref 31.5–35.7)
MCV: 84 fL (ref 79–97)
Monocytes Absolute: 0.4 10*3/uL (ref 0.1–0.9)
Monocytes: 8 %
Neutrophils Absolute: 3.5 10*3/uL (ref 1.4–7.0)
Neutrophils: 63 %
Platelets: 188 10*3/uL (ref 150–450)
RBC: 5.31 x10E6/uL (ref 4.14–5.80)
RDW: 12.8 % (ref 11.6–15.4)
WBC: 5.4 10*3/uL (ref 3.4–10.8)

## 2019-11-28 LAB — URIC ACID: Uric Acid: 5.2 mg/dL (ref 3.8–8.4)

## 2019-11-28 LAB — CMP14+EGFR
ALT: 30 IU/L (ref 0–44)
AST: 19 IU/L (ref 0–40)
Albumin/Globulin Ratio: 1.5 (ref 1.2–2.2)
Albumin: 4.4 g/dL (ref 3.8–4.8)
Alkaline Phosphatase: 104 IU/L (ref 39–117)
BUN/Creatinine Ratio: 18 (ref 10–24)
BUN: 16 mg/dL (ref 8–27)
Bilirubin Total: 0.5 mg/dL (ref 0.0–1.2)
CO2: 21 mmol/L (ref 20–29)
Calcium: 9.4 mg/dL (ref 8.6–10.2)
Chloride: 103 mmol/L (ref 96–106)
Creatinine, Ser: 0.91 mg/dL (ref 0.76–1.27)
GFR calc Af Amer: 101 mL/min/{1.73_m2} (ref >59)
GFR calc non Af Amer: 88 mL/min/{1.73_m2} (ref >59)
Globulin, Total: 2.9 g/dL (ref 1.5–4.5)
Glucose: 212 mg/dL — ABNORMAL HIGH (ref 65–99)
Potassium: 4.7 mmol/L (ref 3.5–5.2)
Sodium: 138 mmol/L (ref 134–144)
Total Protein: 7.3 g/dL (ref 6.0–8.5)

## 2019-11-28 LAB — LIPID PANEL
Chol/HDL Ratio: 3.3 ratio (ref 0.0–5.0)
Cholesterol, Total: 133 mg/dL (ref 100–199)
HDL: 40 mg/dL (ref >39)
LDL Chol Calc (NIH): 67 mg/dL (ref 0–99)
Triglycerides: 151 mg/dL — ABNORMAL HIGH (ref 0–149)
VLDL Cholesterol Cal: 26 mg/dL (ref 5–40)

## 2019-11-28 LAB — HEMOGLOBIN A1C
Est. average glucose Bld gHb Est-mCnc: 197 mg/dL
Hgb A1c MFr Bld: 8.5 % — ABNORMAL HIGH (ref 4.8–5.6)

## 2019-11-28 MED ORDER — TRULICITY 0.75 MG/0.5ML ~~LOC~~ SOAJ: 0.7500 mg | SUBCUTANEOUS | 10 refills | Status: DC

## 2019-11-28 MED ORDER — ROSUVASTATIN CALCIUM 10 MG PO TABS: 10.0000 mg | ORAL_TABLET | Freq: Every day | ORAL | 3 refills | Status: DC

## 2019-11-28 NOTE — Telephone Encounter (Signed)
Pt states they are already taking Atorvastatin 40 mg once daily unsure why it is not included in his med list. and want to switch off of trulicity as it is not covered by his insurance and is too expensive.  What would you have him take instead?

## 2019-11-29 ENCOUNTER — Other Ambulatory Visit: Payer: Self-pay | Admitting: Emergency Medicine

## 2019-11-29 MED ORDER — FARXIGA 5 MG PO TABS: 5.0000 mg | ORAL_TABLET | Freq: Every day | ORAL | 3 refills | Status: DC

## 2019-11-29 NOTE — Telephone Encounter (Signed)
Call patient please.  Atorvastatin included on patient's medication list.  Trulicity not covered by insurance so we will not use.  New prescription for Farxiga 5 mg daily sent.  It looks like this medication is covered by insurance but if it is not then pharmacy has to tell patient and office what is covered so we can properly prescribe. Thanks.

## 2019-12-04 ENCOUNTER — Encounter: Payer: Self-pay | Admitting: Emergency Medicine

## 2019-12-06 ENCOUNTER — Telehealth: Payer: Self-pay | Admitting: *Deleted

## 2019-12-06 ENCOUNTER — Other Ambulatory Visit: Payer: Self-pay | Admitting: *Deleted

## 2019-12-06 MED ORDER — FARXIGA 5 MG PO TABS: 5.0000 mg | ORAL_TABLET | Freq: Every day | ORAL | 3 refills | Status: AC

## 2019-12-06 NOTE — Telephone Encounter (Signed)
Faxed requested signed documents with Farxiga 5 mg prescription Attn: Beverly Milch Mangum or Manson Passey. The prescription program assist with helping the patient to receive Wilder Glade because of the cost. Confirmation page 5:04 pm.

## 2019-12-12 DIAGNOSIS — Z23 Encounter for immunization: Secondary | ICD-10-CM | POA: Diagnosis not present

## 2020-02-05 ENCOUNTER — Encounter: Payer: Self-pay | Admitting: Emergency Medicine

## 2020-02-05 ENCOUNTER — Telehealth (INDEPENDENT_AMBULATORY_CARE_PROVIDER_SITE_OTHER): Payer: Medicare Other | Admitting: Emergency Medicine

## 2020-02-05 ENCOUNTER — Other Ambulatory Visit: Payer: Self-pay

## 2020-02-05 VITALS — Temp 98.2°F | Ht 66.0 in | Wt 184.0 lb

## 2020-02-05 DIAGNOSIS — J988 Other specified respiratory disorders: Secondary | ICD-10-CM | POA: Diagnosis not present

## 2020-02-05 DIAGNOSIS — R6889 Other general symptoms and signs: Secondary | ICD-10-CM | POA: Diagnosis not present

## 2020-02-05 DIAGNOSIS — B9789 Other viral agents as the cause of diseases classified elsewhere: Secondary | ICD-10-CM

## 2020-02-05 DIAGNOSIS — R21 Rash and other nonspecific skin eruption: Secondary | ICD-10-CM | POA: Diagnosis not present

## 2020-02-05 MED ORDER — FLUOCINOLONE ACETONIDE 0.01 % EX CREA: TOPICAL_CREAM | Freq: Two times a day (BID) | CUTANEOUS | 3 refills | Status: DC

## 2020-02-05 NOTE — Patient Instructions (Signed)
° ° ° °  If you have lab work done today you will be contacted with your lab results within the next 2 weeks.  If you have not heard from us then please contact us. The fastest way to get your results is to register for My Chart. ° ° °IF you received an x-ray today, you will receive an invoice from [facility]. Please contact [facility] at [phone] with questions or concerns regarding your invoice.  ° °IF you received labwork today, you will receive an invoice from [facility]. Please contact [facility] at [phone] with questions or concerns regarding your invoice.  ° °Our billing staff will not be able to assist you with questions regarding bills from these companies. ° °You will be contacted with the lab results as soon as they are available. The fastest way to get your results is to activate your My Chart account. Instructions are located on the last page of this paperwork. If you have not heard from us regarding the results in 2 weeks, please contact this office. °  ° ° ° °

## 2020-02-05 NOTE — Progress Notes (Signed)
Telemedicine Encounter- SOAP NOTE Established Patient MyChart video conference This video telephone encounter was conducted with the patient's (or proxy's) verbal consent via video audio telecommunications: yes/no: Yes Patient was instructed to have this encounter in a suitably private space; and to only have persons present to whom they give permission to participate. In addition, patient identity was confirmed by use of name plus two identifiers (DOB and address).  I discussed the limitations, risks, security and privacy concerns of performing an evaluation and management service by telephone and the availability of in person appointments. I also discussed with the patient that there may be a patient responsible charge related to this service. The patient expressed understanding and agreed to proceed.  I spent a total of TIME; 0 MIN TO 60 MIN: 15 minutes talking with the patient or their proxy.  Chief Complaint  Patient presents with  . cold like sx on friday    theraflu over the weekend.  cold this past weekend.   . Chills  . Night Sweats    feels hot but no temp when he checks. (feels)  . Nasal Congestion    Subjective   Aaron [name] is a 69 y.o. male established patient. Telephone visit today complaining of flulike symptoms that started 1 week ago.  Feeling better today.  Has been taking TheraFlu daily.  Wife just starting to come down with a sore throat.  Denies difficulty breathing or chest pain.  Was feverish, tired with runny nose and congestion.  Fully vaccinated against Covid.  No other complaints or medical concerns today.  HPI   Patient Active Problem List   Diagnosis Date Noted  . Morning headache 09/11/2019  . NDPH (new daily persistent headache) 07/12/2019  . Hypertension associated with diabetes ([place]) 05/29/2019  . Gastroesophageal reflux disease without esophagitis 03/13/2017  . Dyslipidemia associated with type 2 diabetes mellitus ([place]) 03/13/2017    Past  Medical History:  Diagnosis Date  . Diabetes mellitus without complication ([place])   . GERD (gastroesophageal reflux disease)   . Hyperlipidemia   . Hypertension     Current Outpatient Medications  Medication Sig Dispense Refill  . amLODipine (NORVASC) 5 MG tablet Take 1 tablet (5 mg total) by mouth daily. 90 tablet 3  . atorvastatin (LIPITOR) 40 MG tablet Take 40 mg by mouth daily.    . dapagliflozin propanediol (FARXIGA) 5 MG TABS tablet Take 5 mg by mouth daily before breakfast. 90 tablet 3  . fluocinolone (VANOS) 0.01 % cream Apply topically 2 (two) times daily. 30 g 3  . glipiZIDE (GLUCOTROL) 5 MG tablet TAKE 1 TABLET(5 MG) BY MOUTH DAILY BEFORE BREAKFAST 90 tablet 1  . metFORMIN (GLUCOPHAGE-XR) 500 MG 24 hr tablet Take 1 tablet (500 mg total) by mouth daily at 8 pm. 180 tablet 0  . triamcinolone cream (KENALOG) 0.1 % Apply 1 application topically 2 (two) times daily.    Marland Kitchen UNABLE TO FIND Take 500 mg by mouth daily. Med Name: Cinsulin Cinnamon     No current facility-administered medications for this visit.    Allergies  Allergen Reactions  . Erythromycin Itching  . Levaquin [Levofloxacin In D5w]     Light sensitivity     Social History   Socioeconomic History  . Marital status: Married    Spouse name: Not on file  . Number of children: 0  . Years of education: Not on file  . Highest education level: High school graduate  Occupational History  . Occupation: Press photographer  Tobacco Use  . Smoking status: Former Smoker    Types: Cigarettes    Quit date: 1985    Years since quitting: 36.4  . Smokeless tobacco: Never Used  Substance and Sexual Activity  . Alcohol use: Yes    Alcohol/week: 7.0 standard drinks    Types: 7 Cans of beer per week    Comment: 1 beer with dinner usually daily  . Drug use: No  . Sexual activity: Yes  Other Topics Concern  . Not on file  Social History Narrative   Marital status:  Married.             Children:   none      Lives:       Employment:  Press photographer      Tobacco:  none      Alcohol:  About 7 drinks per week      Drugs:   none      Exercise:      Seatbelt:      Education: [employer].      Lives at home with wife   Right handed   Caffeine: 2 cups/day   Social Determinants of Health   Financial Resource Strain:   . Difficulty of Paying Living Expenses:   Food Insecurity:   . Worried About Charity fundraiser in the Last Year:   . Arboriculturist in the Last Year:   Transportation Needs:   . Film/video editor (Medical):   Marland Kitchen Lack of Transportation (Non-Medical):   Physical Activity:   . Days of Exercise per Week:   . Minutes of Exercise per Session:   Stress:   . Feeling of Stress :   Social Connections:   . Frequency of Communication with Friends and Family:   . Frequency of Social Gatherings with Friends and Family:   . Attends Religious Services:   . Active Member of Clubs or Organizations:   . Attends Archivist Meetings:   Marland Kitchen Marital Status:   Intimate Partner Violence:   . Fear of Current or Ex-Partner:   . Emotionally Abused:   Marland Kitchen Physically Abused:   . Sexually Abused:     Review of Systems  Constitutional: Positive for fever and malaise/fatigue.  HENT: Positive for congestion. Negative for ear pain and sore throat.   Respiratory: Positive for cough. Negative for sputum production, shortness of breath and wheezing.   Cardiovascular: Negative.  Negative for chest pain and palpitations.  Gastrointestinal: Negative.  Negative for abdominal pain, diarrhea, nausea and vomiting.  Genitourinary: Negative.  Negative for dysuria.  Musculoskeletal: Negative.  Negative for myalgias and neck pain.  Skin: Negative.  Negative for rash.  Neurological: Positive for headaches. Negative for dizziness.  All other systems reviewed and are negative.   Objective  Alert and oriented x3 in no apparent respiratory distress vitals as reported by the patient: Today's Vitals   02/05/20 1639    Temp: 98.2 F (36.8 C)  TempSrc: Oral  Weight: 184 lb (83.5 kg)  Height: 5\' 6"  (1.676 m)    Karina was seen today for cold like sx on friday, chills, night sweats and nasal congestion.  Diagnoses and all orders for this visit:  Viral respiratory infection Comments: Improving  Flu-like symptoms  Rash and nonspecific skin eruption -     fluocinolone (VANOS) 0.01 % cream; Apply topically 2 (two) times daily.      I discussed the assessment and treatment plan with the patient. The patient  was provided an opportunity to ask questions and all were answered. The patient agreed with the plan and demonstrated an understanding of the instructions.   The patient was advised to call back or seek an in-person evaluation if the symptoms worsen or if the condition fails to improve as anticipated.  I provided 15 minutes of non-face-to-face time during this encounter.  [name], MD  Primary Care at [facility]

## 2020-02-21 ENCOUNTER — Ambulatory Visit: Payer: Medicare Other | Admitting: Registered Nurse

## 2020-02-23 ENCOUNTER — Ambulatory Visit (INDEPENDENT_AMBULATORY_CARE_PROVIDER_SITE_OTHER): Payer: Medicare Other

## 2020-02-23 ENCOUNTER — Other Ambulatory Visit: Payer: Self-pay

## 2020-02-23 ENCOUNTER — Encounter: Payer: Self-pay | Admitting: Family Medicine

## 2020-02-23 ENCOUNTER — Ambulatory Visit (INDEPENDENT_AMBULATORY_CARE_PROVIDER_SITE_OTHER): Payer: Medicare Other | Admitting: Family Medicine

## 2020-02-23 VITALS — BP 125/78 | HR 68 | Temp 97.7°F | Ht 66.0 in | Wt 181.0 lb

## 2020-02-23 DIAGNOSIS — R61 Generalized hyperhidrosis: Secondary | ICD-10-CM | POA: Diagnosis not present

## 2020-02-23 DIAGNOSIS — R0789 Other chest pain: Secondary | ICD-10-CM | POA: Diagnosis not present

## 2020-02-23 DIAGNOSIS — R002 Palpitations: Secondary | ICD-10-CM

## 2020-02-23 NOTE — Patient Instructions (Addendum)
See information below on chest wall pain.  Can try Tylenol over-the-counter 3 times per day as needed.  X-ray was reassuring.  If the area is not improving in the next few weeks, follow-up with Dr. Mitchel Honour to decide on other testing or work-up.  Follow-up sooner if worse.  I will check thyroid test and other blood work for the heart palpitations.  See information below.  I would like you to meet with cardiology will place a referral.  EKG is similar to the previous one, but would like cardiology to review that as well.  Check blood sugar if any sweats to make sure it is into too low. Follow up with Dr. Mitchel Honour to discuss further.   Thank you for coming in today.   Return to the clinic or go to the nearest emergency room if any of your symptoms worsen or new symptoms occur.   Palpitations Palpitations are feelings that your heartbeat is irregular or is faster than normal. It may feel like your heart is fluttering or skipping a beat. Palpitations are usually not a serious problem. They may be caused by many things, including smoking, caffeine, alcohol, stress, and certain medicines or drugs. Most causes of palpitations are not serious. However, some palpitations can be a sign of a serious problem. You may need further tests to rule out serious medical problems. Follow these instructions at home:     Pay attention to any changes in your condition. Take these actions to help manage your symptoms: Eating and drinking  Avoid foods and drinks that may cause palpitations. These may include: ? Caffeinated coffee, tea, soft drinks, diet pills, and energy drinks. ? Chocolate. ? Alcohol. Lifestyle  Take steps to reduce your stress and anxiety. Things that can help you relax include: ? Yoga. ? Mind-body activities, such as deep breathing, meditation, or using words and images to create positive thoughts (guided imagery). ? Physical activity, such as swimming, jogging, or walking. Tell your health  care provider if your palpitations increase with activity. If you have chest pain or shortness of breath with activity, do not continue the activity until you are seen by your health care provider. ? Biofeedback. This is a method that helps you learn to use your mind to control things in your body, such as your heartbeat.  Do not use drugs, including cocaine or ecstasy. Do not use marijuana.  Get plenty of rest and sleep. Keep a regular bed time. General instructions  Take over-the-counter and prescription medicines only as told by your health care provider.  Do not use any products that contain nicotine or tobacco, such as cigarettes and e-cigarettes. If you need help quitting, ask your health care provider.  Keep all follow-up visits as told by your health care provider. This is important. These may include visits for further testing if palpitations do not go away or get worse. Contact a health care provider if you:  Continue to have a fast or irregular heartbeat after 24 hours.  Notice that your palpitations occur more often. Get help right away if you:  Have chest pain or shortness of breath.  Have a severe headache.  Feel dizzy or you faint. Summary  Palpitations are feelings that your heartbeat is irregular or is faster than normal. It may feel like your heart is fluttering or skipping a beat.  Palpitations may be caused by many things, including smoking, caffeine, alcohol, stress, certain medicines, and drugs.  Although most causes of palpitations are not serious, some  causes can be a sign of a serious medical problem.  Get help right away if you faint or have chest pain, shortness of breath, a severe headache, or dizziness. This information is not intended to replace advice given to you by your health care provider. Make sure you discuss any questions you have with your health care provider. Document Revised: 09/29/2017 Document Reviewed: 09/29/2017 Elsevier Patient  Education  [employer].   Chest Wall Pain Chest wall pain is pain in or around the bones and muscles of your chest. Sometimes, an injury causes this pain. Excessive coughing or overuse of arm and chest muscles may also cause chest wall pain. Sometimes, the cause may not be known. This pain may take several weeks or longer to get better. Follow these instructions at home: Managing pain, stiffness, and swelling   If directed, put ice on the painful area: ? Put ice in a plastic bag. ? Place a towel between your skin and the bag. ? Leave the ice on for 20 minutes, 2-3 times per day. Activity  Rest as told by your health care provider.  Avoid activities that cause pain. These include any activities that use your chest muscles or your abdominal and side muscles to lift heavy items. Ask your health care provider what activities are safe for you. General instructions   Take over-the-counter and prescription medicines only as told by your health care provider.  Do not use any products that contain nicotine or tobacco, such as cigarettes, e-cigarettes, and chewing tobacco. These can delay healing after injury. If you need help quitting, ask your health care provider.  Keep all follow-up visits as told by your health care provider. This is important. Contact a health care provider if:  You have a fever.  Your chest pain becomes worse.  You have new symptoms. Get help right away if:  You have nausea or vomiting.  You feel sweaty or light-headed.  You have a cough with mucus from your lungs (sputum) or you cough up blood.  You develop shortness of breath. These symptoms may represent a serious problem that is an emergency. Do not wait to see if the symptoms will go away. Get medical help right away. Call your local emergency services (911 in the [place] Do not drive yourself to the hospital. Summary  Chest wall pain is pain in or around the bones and muscles of your  chest.  Depending on the cause, it may be treated with ice, rest, medicines, and avoiding activities that cause pain.  Contact a health care provider if you have a fever, worsening chest pain, or new symptoms.  Get help right away if you feel light-headed or you develop shortness of breath. These symptoms may be an emergency. This information is not intended to replace advice given to you by your health care provider. Make sure you discuss any questions you have with your health care provider. Document Revised: 02/17/2018 Document Reviewed: 02/17/2018 Elsevier Patient Education  [employer].    If you have lab work done today you will be contacted with your lab results within the next 2 weeks.  If you have not heard from [place] then please contact us. The fastest way to get your results is to register for My Chart.   IF you received an x-ray today, you will receive an invoice from [facility]. Please contact [facility] at [phone] with questions or concerns regarding your invoice.   IF you received labwork today, you will  receive an invoice from [employer]. Please contact [facility] at [phone] with questions or concerns regarding your invoice.   Our billing staff will not be able to assist you with questions regarding bills from these companies.  You will be contacted with the lab results as soon as they are available. The fastest way to get your results is to activate your My Chart account. Instructions are located on the last page of this paperwork. If you have not heard from [place] regarding the results in 2 weeks, please contact this office.

## 2020-02-23 NOTE — Progress Notes (Signed)
Subjective:  Patient ID: Aaron [name], male    DOB: March 27, 1951  Age: 69 y.o. MRN: [id number]  CC:  Chief Complaint  Patient presents with  . Back Pain    approx 4 weeks, worse in am , lower R mid back    HPI DELRICO MINEHART presents for   Back pain: Lower R side, past 1 month. Chest wall. No cough, no dyspnea.  Aggravating side pain. Notices with walking. No change with activities in general.  No known injury or new activities.  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  Feels different than in past with back pain at times.  Night sweats for past 6-7 months, not checking glucose at thte time. . No fever. No unexplained wt loss- intentional with diet. Hx of DM, new med farxiga 60 days ago.  No new urinary symptoms/dysuria/hematuria.  No rash.  No bowel or bladder incontinence, no saddle anesthesia, no lower extremity weakness.  No R leg radiation.  Tx: none. Not bad enough to take pain reliever.  Tobacco - in 20's only, no recent use.   XR in 11/07/18  IMPRESSION: Mild multilevel osteoarthritic changes of the lumbosacral spine.  Heart Palpitations: Occasionally notes heart racing or beating faster than usual. Notes at different times. With walking, sometimes at rest.  Few times per week. Lasts 10-21min.  No associated CP/diaphoresis/dyspnea.  2 cups coffee in am. No changes.  No recent alcohol.  No tobacco.  Lab Results  Component Value Date   TSH 2.220 02/23/2020    History Patient Active Problem List   Diagnosis Date Noted  . Morning headache 09/11/2019  . NDPH (new daily persistent headache) 07/12/2019  . Hypertension associated with diabetes ([place]) 05/29/2019  . Gastroesophageal reflux disease without esophagitis 03/13/2017  . Dyslipidemia associated with type 2 diabetes mellitus ([place]) 03/13/2017   Past Medical History:  Diagnosis Date  . Diabetes mellitus without complication ([place])   . GERD (gastroesophageal reflux disease)   . Hyperlipidemia   .  Hypertension    No past surgical history on file. Allergies  Allergen Reactions  . Erythromycin Itching  . Levaquin [Levofloxacin In D5w]     Light sensitivity    Prior to Admission medications   Medication Sig Start Date End Date Taking? Authorizing Provider  amLODipine (NORVASC) 5 MG tablet Take 1 tablet (5 mg total) by mouth daily. 06/26/19  Yes Sagardia, Ines Bloomer, MD  atorvastatin (LIPITOR) 40 MG tablet Take 40 mg by mouth daily.   Yes [provider]  dapagliflozin propanediol (FARXIGA) 5 MG TABS tablet Take 5 mg by mouth daily before breakfast. 12/06/19 03/05/20 Yes Rutherford Guys, MD  fluocinolone (VANOS) 0.01 % cream Apply topically 2 (two) times daily. 02/05/20  Yes Sagardia, Ines Bloomer, MD  glipiZIDE (GLUCOTROL) 5 MG tablet TAKE 1 TABLET(5 MG) BY MOUTH DAILY BEFORE BREAKFAST 05/29/19  Yes Sagardia, Ines Bloomer, MD  metFORMIN (GLUCOPHAGE-XR) 500 MG 24 hr tablet Take 1 tablet (500 mg total) by mouth daily at 8 pm. 10/02/19  Yes Sagardia, Ines Bloomer, MD   Social History   Socioeconomic History  . Marital status: Married    Spouse name: Not on file  . Number of children: 0  . Years of education: Not on file  . Highest education level: High school graduate  Occupational History  . Occupation: Sales  Tobacco Use  . Smoking status: Former Smoker    Types: Cigarettes    Quit date: 1985    Years since quitting: 36.5  .  Smokeless tobacco: Never Used  Vaping Use  . Vaping Use: Never used  Substance and Sexual Activity  . Alcohol use: Yes    Alcohol/week: 7.0 standard drinks    Types: 7 Cans of beer per week    Comment: 1 beer with dinner usually daily  . Drug use: No  . Sexual activity: Yes  Other Topics Concern  . Not on file  Social History Narrative   Marital status:  Married.             Children:   none      Lives:      Employment:  Press photographer      Tobacco:  none      Alcohol:  About 7 drinks per week      Drugs:   none      Exercise:      Seatbelt:       Education: [employer].      Lives at home with wife   Right handed   Caffeine: 2 cups/day   Social Determinants of Health   Financial Resource Strain:   . Difficulty of Paying Living Expenses:   Food Insecurity:   . Worried About Charity fundraiser in the Last Year:   . Arboriculturist in the Last Year:   Transportation Needs:   . Film/video editor (Medical):   Marland Kitchen Lack of Transportation (Non-Medical):   Physical Activity:   . Days of Exercise per Week:   . Minutes of Exercise per Session:   Stress:   . Feeling of Stress :   Social Connections:   . Frequency of Communication with Friends and Family:   . Frequency of Social Gatherings with Friends and Family:   . Attends Religious Services:   . Active Member of Clubs or Organizations:   . Attends Archivist Meetings:   Marland Kitchen Marital Status:   Intimate Partner Violence:   . Fear of Current or Ex-Partner:   . Emotionally Abused:   Marland Kitchen Physically Abused:   . Sexually Abused:     Review of Systems Per HPI.   Objective:   Vitals:   02/23/20 0835 02/23/20 0847  BP: (!) 143/74 125/78  Pulse: 68   Temp: 97.7 F (36.5 C)   SpO2: 98%   Weight: 181 lb (82.1 kg)   Height: 5\' 6"  (1.676 m)      Physical Exam Vitals reviewed.  Constitutional:      Appearance: He is well-developed.  HENT:     Head: Normocephalic and atraumatic.  Eyes:     Pupils: Pupils are equal, round, and reactive to light.  Neck:     Vascular: No carotid bruit or JVD.  Cardiovascular:     Rate and Rhythm: Normal rate and regular rhythm.     Heart sounds: Normal heart sounds. No murmur heard.   Pulmonary:     Effort: Pulmonary effort is normal.     Breath sounds: Normal breath sounds. No rales.  Musculoskeletal:       Back:  Skin:    General: Skin is warm and dry.     Comments: Few scattered cherry angiomas on back.  Neurological:     Mental Status: He is alert and oriented to person, place, and time.    EKG sinus rhythm,  possible change in V3 with R wave progression compared to 10/01/2018.  No acute findings.  DG Ribs Unilateral W/Chest Right  Result Date: 02/23/2020 CLINICAL DATA:  Right posterior  chest wall pain. EXAM: RIGHT RIBS AND CHEST - 3+ VIEW COMPARISON:  Chest x-ray 05/14/2018 FINDINGS: Frontal view of the chest shows normal size of the cardiopericardial silhouette. The lungs are clear without focal pneumonia, edema, pneumothorax or pleural effusion. The visualized bony structures of the thorax are intact. Oblique views of the right ribs show no evidence for rib fracture. No worrisome lytic or sclerotic rib abnormality by x-ray. IMPRESSION: Negative. Electronically Signed   By: Misty Stanley M.D.   On: 02/23/2020 09:45     Assessment & Plan:  KYRIN GRATZ is a 69 y.o. male . Night sweats - Plan: CBC, Basic metabolic panel, Sedimentation Rate  Right-sided chest wall pain - Plan: DG Ribs Unilateral W/Chest Right  Palpitations - Plan: CBC, Basic metabolic panel, TSH, EKG 85-UDJS, Ambulatory referral to Cardiology  Suspected chest wall pain.  Reassuring imaging, EKG as above.  Tylenol over-the-counter up to 3 times per day for now, handout given on chest wall pain with RTC precautions.  Follow-up with PCP in the next few weeks if not improving, sooner if worse.  For palpitations, check TSH, refer to cardiology.  No concerning findings on EKG.  We will also check sed rate for night sweats, recommended checking glucose if symptoms occur to rule out hypoglycemia, and follow-up with PCP to discuss further.  No orders of the defined types were placed in this encounter.  Patient Instructions    See information below on chest wall pain.  Can try Tylenol over-the-counter 3 times per day as needed.  X-ray was reassuring.  If the area is not improving in the next few weeks, follow-up with Dr. Mitchel Honour to decide on other testing or work-up.  Follow-up sooner if worse.  I will check thyroid test and other  blood work for the heart palpitations.  See information below.  I would like you to meet with cardiology will place a referral.  EKG is similar to the previous one, but would like cardiology to review that as well.  Check blood sugar if any sweats to make sure it is into too low. Follow up with Dr. Mitchel Honour to discuss further.   Thank you for coming in today.   Return to the clinic or go to the nearest emergency room if any of your symptoms worsen or new symptoms occur.   Palpitations Palpitations are feelings that your heartbeat is irregular or is faster than normal. It may feel like your heart is fluttering or skipping a beat. Palpitations are usually not a serious problem. They may be caused by many things, including smoking, caffeine, alcohol, stress, and certain medicines or drugs. Most causes of palpitations are not serious. However, some palpitations can be a sign of a serious problem. You may need further tests to rule out serious medical problems. Follow these instructions at home:     Pay attention to any changes in your condition. Take these actions to help manage your symptoms: Eating and drinking  Avoid foods and drinks that may cause palpitations. These may include: ? Caffeinated coffee, tea, soft drinks, diet pills, and energy drinks. ? Chocolate. ? Alcohol. Lifestyle  Take steps to reduce your stress and anxiety. Things that can help you relax include: ? Yoga. ? Mind-body activities, such as deep breathing, meditation, or using words and images to create positive thoughts (guided imagery). ? Physical activity, such as swimming, jogging, or walking. Tell your health care provider if your palpitations increase with activity. If you have chest pain or shortness  of breath with activity, do not continue the activity until you are seen by your health care provider. ? Biofeedback. This is a method that helps you learn to use your mind to control things in your body, such as your  heartbeat.  Do not use drugs, including cocaine or ecstasy. Do not use marijuana.  Get plenty of rest and sleep. Keep a regular bed time. General instructions  Take over-the-counter and prescription medicines only as told by your health care provider.  Do not use any products that contain nicotine or tobacco, such as cigarettes and e-cigarettes. If you need help quitting, ask your health care provider.  Keep all follow-up visits as told by your health care provider. This is important. These may include visits for further testing if palpitations do not go away or get worse. Contact a health care provider if you:  Continue to have a fast or irregular heartbeat after 24 hours.  Notice that your palpitations occur more often. Get help right away if you:  Have chest pain or shortness of breath.  Have a severe headache.  Feel dizzy or you faint. Summary  Palpitations are feelings that your heartbeat is irregular or is faster than normal. It may feel like your heart is fluttering or skipping a beat.  Palpitations may be caused by many things, including smoking, caffeine, alcohol, stress, certain medicines, and drugs.  Although most causes of palpitations are not serious, some causes can be a sign of a serious medical problem.  Get help right away if you faint or have chest pain, shortness of breath, a severe headache, or dizziness. This information is not intended to replace advice given to you by your health care provider. Make sure you discuss any questions you have with your health care provider. Document Revised: 09/29/2017 Document Reviewed: 09/29/2017 Elsevier Patient Education  [employer].   Chest Wall Pain Chest wall pain is pain in or around the bones and muscles of your chest. Sometimes, an injury causes this pain. Excessive coughing or overuse of arm and chest muscles may also cause chest wall pain. Sometimes, the cause may not be known. This pain may take several  weeks or longer to get better. Follow these instructions at home: Managing pain, stiffness, and swelling   If directed, put ice on the painful area: ? Put ice in a plastic bag. ? Place a towel between your skin and the bag. ? Leave the ice on for 20 minutes, 2-3 times per day. Activity  Rest as told by your health care provider.  Avoid activities that cause pain. These include any activities that use your chest muscles or your abdominal and side muscles to lift heavy items. Ask your health care provider what activities are safe for you. General instructions   Take over-the-counter and prescription medicines only as told by your health care provider.  Do not use any products that contain nicotine or tobacco, such as cigarettes, e-cigarettes, and chewing tobacco. These can delay healing after injury. If you need help quitting, ask your health care provider.  Keep all follow-up visits as told by your health care provider. This is important. Contact a health care provider if:  You have a fever.  Your chest pain becomes worse.  You have new symptoms. Get help right away if:  You have nausea or vomiting.  You feel sweaty or light-headed.  You have a cough with mucus from your lungs (sputum) or you cough up blood.  You develop shortness of  breath. These symptoms may represent a serious problem that is an emergency. Do not wait to see if the symptoms will go away. Get medical help right away. Call your local emergency services (911 in the [place] Do not drive yourself to the hospital. Summary  Chest wall pain is pain in or around the bones and muscles of your chest.  Depending on the cause, it may be treated with ice, rest, medicines, and avoiding activities that cause pain.  Contact a health care provider if you have a fever, worsening chest pain, or new symptoms.  Get help right away if you feel light-headed or you develop shortness of breath. These symptoms may be an  emergency. This information is not intended to replace advice given to you by your health care provider. Make sure you discuss any questions you have with your health care provider. Document Revised: 02/17/2018 Document Reviewed: 02/17/2018 Elsevier Patient Education  [employer].    If you have lab work done today you will be contacted with your lab results within the next 2 weeks.  If you have not heard from [place] then please contact us. The fastest way to get your results is to register for My Chart.   IF you received an x-ray today, you will receive an invoice from [facility]. Please contact [facility] at [phone] with questions or concerns regarding your invoice.   IF you received labwork today, you will receive an invoice from [facility]. Please contact [facility] at [phone] with questions or concerns regarding your invoice.   Our billing staff will not be able to assist you with questions regarding bills from these companies.  You will be contacted with the lab results as soon as they are available. The fastest way to get your results is to activate your My Chart account. Instructions are located on the last page of this paperwork. If you have not heard from [place] regarding the results in 2 weeks, please contact this office.       Signed, Merri Ray, MD [facility]

## 2020-02-24 LAB — BASIC METABOLIC PANEL
BUN/Creatinine Ratio: 16 (ref 10–24)
BUN: 14 mg/dL (ref 8–27)
CO2: 21 mmol/L (ref 20–29)
Calcium: 9.3 mg/dL (ref 8.6–10.2)
Chloride: 102 mmol/L (ref 96–106)
Creatinine, Ser: 0.86 mg/dL (ref 0.76–1.27)
GFR calc Af Amer: 104 mL/min/{1.73_m2} (ref >59)
GFR calc non Af Amer: 90 mL/min/{1.73_m2} (ref >59)
Glucose: 89 mg/dL (ref 65–99)
Potassium: 4.2 mmol/L (ref 3.5–5.2)
Sodium: 139 mmol/L (ref 134–144)

## 2020-02-24 LAB — CBC
Hematocrit: 50.6 % (ref 37.5–51.0)
Hemoglobin: 15.9 g/dL (ref 13.0–17.7)
MCH: 26.3 pg — ABNORMAL LOW (ref 26.6–33.0)
MCHC: 31.4 g/dL — ABNORMAL LOW (ref 31.5–35.7)
MCV: 84 fL (ref 79–97)
Platelets: 292 10*3/uL (ref 150–450)
RBC: 6.05 x10E6/uL — ABNORMAL HIGH (ref 4.14–5.80)
RDW: 12.8 % (ref 11.6–15.4)
WBC: 6.3 10*3/uL (ref 3.4–10.8)

## 2020-02-24 LAB — TSH: TSH: 2.22 u[IU]/mL (ref 0.450–4.500)

## 2020-02-24 LAB — SEDIMENTATION RATE: Sed Rate: 15 mm/hr (ref 0–30)

## 2020-02-25 ENCOUNTER — Encounter: Payer: Self-pay | Admitting: Family Medicine

## 2020-02-29 NOTE — Progress Notes (Signed)
Cardiology Office Note   Date:  03/01/2020   ID:  KARSYN ROCHIN, DOB [birth year], MRN [id number]  PCP:  [name], MD  Cardiologist:   No primary care provider on file. Referring:  [name], MD  Chief Complaint  Patient presents with  . Palpitations      History of Present Illness: Aaron [name] is a 69 y.o. male who is referred by [name], MD for evaluation of palpitations.  The patient has no cardiovascular risk history.  He does have cardiovascular risk factors.  However, he does not have chest pressure, neck or arm discomfort.  He does not have shortness of breath, PND or orthopnea.  He is recently retired.  He has been walking daily for exercise.  He is lost 20 pounds.  He has been noticing that when he is walking he might get tachypalpitations.  This lasts for a few minutes at a time.  It is daily.  He feels like his heart is racing.  It last for several minutes.  He might have to stop when he is doing.  He gets a little lightheaded.  He is not had any syncope.  There is some vague left chest discomfort that is not a predominant symptom.  He has no associated nausea vomiting or diaphoresis.  It is not reproducible every time he exercises and is also had it at rest.  Past Medical History:  Diagnosis Date  . Diabetes mellitus without complication ([place])   . GERD (gastroesophageal reflux disease)   . Hyperlipidemia   . Hypertension     History reviewed. No pertinent surgical history.   Current Outpatient Medications  Medication Sig Dispense Refill  . amLODipine (NORVASC) 5 MG tablet Take 1 tablet (5 mg total) by mouth daily. 90 tablet 3  . atorvastatin (LIPITOR) 40 MG tablet Take 40 mg by mouth daily.    . dapagliflozin propanediol (FARXIGA) 5 MG TABS tablet Take 5 mg by mouth daily before breakfast. 90 tablet 3  . fluocinolone (VANOS) 0.01 % cream Apply topically 2 (two) times daily. 30 g 3  . glipiZIDE (GLUCOTROL) 5 MG tablet TAKE  1 TABLET(5 MG) BY MOUTH DAILY BEFORE BREAKFAST 90 tablet 1  . metFORMIN (GLUCOPHAGE-XR) 500 MG 24 hr tablet Take 1 tablet (500 mg total) by mouth daily at 8 pm. 180 tablet 0   No current facility-administered medications for this visit.    Allergies:   Erythromycin and Levaquin [levofloxacin in d5w]    Social History:  The patient  reports that he quit smoking about 36 years ago. His smoking use included cigarettes. He has never used smokeless tobacco. He reports current alcohol use of about 7.0 standard drinks of alcohol per week. He reports that he does not use drugs.   Family History:  The patient's family history includes Cancer in his mother; Cancer (age of onset: 44) in his father.    ROS:  Please see the history of present illness.   Otherwise, review of systems are positive for none.   All other systems are reviewed and negative.    PHYSICAL EXAM: VS:  BP 127/77   Pulse 78   Ht 5\' 6"  (1.676 m)   Wt 178 lb 9.6 oz (81 kg)   SpO2 97%   BMI 28.83 kg/m  , BMI Body mass index is 28.83 kg/m. GENERAL:  Well appearing HEENT:  Pupils equal round and reactive, fundi not visualized, oral mucosa unremarkable NECK:  No jugular  venous distention, waveform within normal limits, carotid upstroke brisk and symmetric, no bruits, no thyromegaly LYMPHATICS:  No cervical, inguinal adenopathy LUNGS:  Clear to auscultation bilaterally BACK:  No CVA tenderness CHEST:  Unremarkable HEART:  PMI not displaced or sustained,S1 and S2 within normal limits, no S3, no S4, no clicks, no rubs, no murmurs ABD:  Flat, positive bowel sounds normal in frequency in pitch, no bruits, no rebound, no guarding, no midline pulsatile mass, no hepatomegaly, no splenomegaly EXT:  2 plus pulses throughout, no edema, no cyanosis no clubbing SKIN:  No rashes no nodules NEURO:  Cranial nerves II through XII grossly intact, motor grossly intact throughout PSYCH:  Cognitively intact, oriented to person place and  time    EKG:  EKG is not ordered today. The ekg ordered 02/23/2020 demonstrates sinus rhythm, rate 63, axis within normal limits, intervals within normal limits, no acute ST-T wave changes.   Recent Labs: 11/27/2019: ALT 30 02/23/2020: BUN 14; Creatinine, Ser 0.86; Hemoglobin 15.9; Platelets 292; Potassium 4.2; Sodium 139; TSH 2.220    Lipid Panel    Component Value Date/Time   CHOL 133 11/27/2019 1048   TRIG 151 (H) 11/27/2019 1048   HDL 40 11/27/2019 1048   CHOLHDL 3.3 11/27/2019 1048   CHOLHDL 3.9 04/29/2015 1339   VLDL 27 04/29/2015 1339   LDLCALC 67 11/27/2019 1048      Wt Readings from Last 3 Encounters:  03/01/20 178 lb 9.6 oz (81 kg)  02/23/20 181 lb (82.1 kg)  02/05/20 184 lb (83.5 kg)      Other studies Reviewed: Additional studies/ records that were reviewed today include: Labs. Review of the above records demonstrates:  Please see elsewhere in the note.     ASSESSMENT AND PLAN:  PALPITATIONS: Patient is having palpitations and thinks we can catch him if he wears a 3-day monitor.  I do see that his electrolytes and thyroid were normal.  He is not anemic.  We talked about the differential.  Further therapy will be based on these results.  COVID EDUCATION: He has had his vaccination.  Current medicines are reviewed at length with the patient today.  The patient does not have concerns regarding medicines.  The following changes have been made:  no change  Labs/ tests ordered today include:   Orders Placed This Encounter  Procedures  . LONG TERM MONITOR (3-14 DAYS)     Disposition:   FU with me as needed and as based on the results of the above testing or further symptoms.   Signed, Minus Breeding, MD  03/01/2020 5:30 PM    [place]

## 2020-03-01 ENCOUNTER — Encounter: Payer: Self-pay | Admitting: Cardiology

## 2020-03-01 ENCOUNTER — Ambulatory Visit (INDEPENDENT_AMBULATORY_CARE_PROVIDER_SITE_OTHER): Payer: Medicare Other | Admitting: Cardiology

## 2020-03-01 ENCOUNTER — Other Ambulatory Visit: Payer: Self-pay

## 2020-03-01 VITALS — BP 127/77 | HR 78 | Ht 66.0 in | Wt 178.6 lb

## 2020-03-01 DIAGNOSIS — R002 Palpitations: Secondary | ICD-10-CM

## 2020-03-01 NOTE — Patient Instructions (Signed)
Medication Instructions:  Your physician recommends that you continue on your current medications as directed. Please refer to the Current Medication list given to you today.  Lab Work: NONE  Testing/Procedures: 3 DAY ZIO MONITOR    Follow-Up: AS NEEDED   Other Instructions  ZIO XT- Long Term Monitor Instructions   Your physician has requested you wear your ZIO patch monitor___3____days.   This is a single patch monitor.  [employer] supplies one patch monitor per enrollment.  Additional stickers are not available.   Please do not apply patch if you will be having a Nuclear Stress Test, Echocardiogram, Cardiac CT, MRI, or Chest Xray during the time frame you would be wearing the monitor. The patch cannot be worn during these tests.  You cannot remove and re-apply the ZIO XT patch monitor.   Your ZIO patch monitor will be sent USPS Priority mail from [employer] directly to your home address. The monitor may also be mailed to a PO BOX if home delivery is not available.   It may take 3-5 days to receive your monitor after you have been enrolled.   Once you have received you monitor, please review enclosed instructions.  Your monitor has already been registered assigning a specific monitor serial # to you.   Applying the monitor   Shave hair from upper left chest.   Hold abrader disc by orange tab.  Rub abrader in 40 strokes over left upper chest as indicated in your monitor instructions.   Clean area with 4 enclosed alcohol pads .  Use all pads to assure are is cleaned thoroughly.  Let dry.   Apply patch as indicated in monitor instructions.  Patch will be place under collarbone on left side of chest with arrow pointing upward.   Rub patch adhesive wings for 2 minutes.Remove white label marked "1".  Remove white label marked "2".  Rub patch adhesive wings for 2 additional minutes.   While looking in a mirror, press and release button in center of patch.  A small green light  will flash 3-4 times .  This will be your only indicator the monitor has been turned on.     Do not shower for the first 24 hours.  You may shower after the first 24 hours.   Press button if you feel a symptom. You will hear a small click.  Record Date, Time and Symptom in the Patient Log Book.   When you are ready to remove patch, follow instructions on last 2 pages of Patient Log Book.  Stick patch monitor onto last page of Patient Log Book.   Place Patient Log Book in Blue box.  Use locking tab on box and tape box closed securely.  The Orange and White box has prepaid postage on it.  Please place in mailbox as soon as possible.  Your physician should have your test results approximately 7 days after the monitor has been mailed back to [employer].   Call [employer] Customer Care at [phone] if you have questions regarding your ZIO XT patch monitor.  Call them immediately if you see an orange light blinking on your monitor.   If your monitor falls off in less than 4 days contact our Monitor department at [phone].  If your monitor becomes loose or falls off after 4 days call [employer] at [phone] for suggestions on securing your monitor.   

## 2020-03-05 ENCOUNTER — Telehealth: Payer: Self-pay | Admitting: Radiology

## 2020-03-05 ENCOUNTER — Other Ambulatory Visit: Payer: Self-pay | Admitting: Emergency Medicine

## 2020-03-05 DIAGNOSIS — E1165 Type 2 diabetes mellitus with hyperglycemia: Secondary | ICD-10-CM

## 2020-03-05 NOTE — Telephone Encounter (Signed)
Enrolled patient for a 3 day Zio monitor to be mailed to patients home.  

## 2020-03-08 ENCOUNTER — Ambulatory Visit (INDEPENDENT_AMBULATORY_CARE_PROVIDER_SITE_OTHER): Payer: Medicare Other

## 2020-03-08 DIAGNOSIS — R002 Palpitations: Secondary | ICD-10-CM

## 2020-03-17 ENCOUNTER — Other Ambulatory Visit: Payer: Self-pay | Admitting: Emergency Medicine

## 2020-03-17 DIAGNOSIS — E1165 Type 2 diabetes mellitus with hyperglycemia: Secondary | ICD-10-CM

## 2020-03-17 NOTE — Telephone Encounter (Signed)
Requested medication (s) are due for refill today: yes  Requested medication (s) are on the active medication list: yes  Last refill:  10/02/19  Future visit scheduled: yes  Notes to clinic:  overdue for lab work   Requested Prescriptions  Pending Prescriptions Disp Refills   metFORMIN (GLUCOPHAGE-XR) 500 MG 24 hr tablet [Pharmacy Med Name: METFORMIN ER 500MG 24HR TABS] 180 tablet 0    Sig: TAKE 1 TABLET BY MOUTH DAILY AT 8 PM      Endocrinology:  Diabetes - Biguanides Failed - 03/17/2020 12:59 PM      Failed - HBA1C is between 0 and 7.9 and within 180 days    Hgb A1c MFr Bld  Date Value Ref Range Status  11/27/2019 8.5 (H) 4.8 - 5.6 % Final    Comment:             Prediabetes: 5.7 - 6.4          Diabetes: >6.4          Glycemic control for adults with diabetes: <7.0           Passed - Cr in normal range and within 360 days    Creat  Date Value Ref Range Status  04/29/2015 0.71 0.70 - 1.25 mg/dL Final   Creatinine, Ser  Date Value Ref Range Status  02/23/2020 0.86 0.76 - 1.27 mg/dL Final   Creatinine, Urine  Date Value Ref Range Status  04/06/2014 181.1 mg/dL Final          Passed - eGFR in normal range and within 360 days    GFR, Est African American  Date Value Ref Range Status  04/29/2015 >89 >=60 mL/min Final   GFR calc Af Amer  Date Value Ref Range Status  02/23/2020 104 >59 mL/min/1.73 Final    Comment:    **[facility] currently reports eGFR in compliance with the current**   recommendations of the [employer]. [facility] will   update reporting as new guidelines are published from the NKF-ASN   Task force.    GFR, Est Non African American  Date Value Ref Range Status  04/29/2015 >89 >=60 mL/min Final    Comment:      The estimated GFR is a calculation valid for adults (>=46 years old) that uses the CKD-EPI algorithm to adjust for age and sex. It is   not to be used for children, pregnant women, hospitalized patients,    patients on  dialysis, or with rapidly changing kidney function. According to the NKDEP, eGFR >89 is normal, 60-89 shows mild impairment, 30-59 shows moderate impairment, 15-29 shows severe impairment and <15 is ESRD.      GFR calc non Af Amer  Date Value Ref Range Status  02/23/2020 90 >59 mL/min/1.73 Final          Passed - Valid encounter within last 6 months    Recent Outpatient Visits           3 weeks ago Night sweats   [facility] at [facility], Ranell Patrick, MD   1 month ago Viral respiratory infection   Primary Care at [facility], [place], MD   3 months ago Hypertension associated with diabetes [facility])   Primary Care at [facility], Ines Bloomer, MD   4 months ago Medicare annual wellness visit, subsequent   Primary Care at [facility], [place], MD   5 months ago Acute pain of left knee   Primary Care at [facility], [place]  Jacqulyn Bath, MD       Future Appointments             In 2 months [place], Ines Bloomer, MD Primary Care at [place], [facility]

## 2020-03-22 DIAGNOSIS — R002 Palpitations: Secondary | ICD-10-CM | POA: Diagnosis not present

## 2020-04-02 ENCOUNTER — Telehealth: Payer: Self-pay | Admitting: Cardiology

## 2020-04-02 NOTE — Telephone Encounter (Signed)
Called patient, gave results.  ?Patient verbalized understanding.  ? ? ?

## 2020-04-02 NOTE — Telephone Encounter (Signed)
Patient calling for monitor results. 

## 2020-04-05 ENCOUNTER — Telehealth: Payer: Self-pay | Admitting: Cardiology

## 2020-04-05 NOTE — Telephone Encounter (Signed)
Patient is returning a call in regards to his monitor results.

## 2020-04-05 NOTE — Telephone Encounter (Signed)
Called patient,  he received monitor results- on Monday, but had a question. Answered question.  Patient had no other questions at this time.

## 2020-04-09 ENCOUNTER — Ambulatory Visit (INDEPENDENT_AMBULATORY_CARE_PROVIDER_SITE_OTHER): Payer: Medicare Other | Admitting: Emergency Medicine

## 2020-04-09 ENCOUNTER — Encounter: Payer: Self-pay | Admitting: Emergency Medicine

## 2020-04-09 ENCOUNTER — Other Ambulatory Visit: Payer: Self-pay

## 2020-04-09 VITALS — BP 138/73 | HR 86 | Temp 98.0°F | Ht 66.0 in | Wt 172.4 lb

## 2020-04-09 DIAGNOSIS — E785 Hyperlipidemia, unspecified: Secondary | ICD-10-CM | POA: Diagnosis not present

## 2020-04-09 DIAGNOSIS — E1169 Type 2 diabetes mellitus with other specified complication: Secondary | ICD-10-CM | POA: Diagnosis not present

## 2020-04-09 DIAGNOSIS — I10 Essential (primary) hypertension: Secondary | ICD-10-CM | POA: Diagnosis not present

## 2020-04-09 DIAGNOSIS — I152 Hypertension secondary to endocrine disorders: Secondary | ICD-10-CM

## 2020-04-09 DIAGNOSIS — L989 Disorder of the skin and subcutaneous tissue, unspecified: Secondary | ICD-10-CM | POA: Diagnosis not present

## 2020-04-09 DIAGNOSIS — R21 Rash and other nonspecific skin eruption: Secondary | ICD-10-CM | POA: Diagnosis not present

## 2020-04-09 DIAGNOSIS — E1159 Type 2 diabetes mellitus with other circulatory complications: Secondary | ICD-10-CM

## 2020-04-09 LAB — POCT URINALYSIS DIP (MANUAL ENTRY)
Bilirubin, UA: NEGATIVE
Blood, UA: NEGATIVE
Glucose, UA: >=1000 mg/dL — AB
Leukocytes, UA: NEGATIVE
Nitrite, UA: NEGATIVE
Protein Ur, POC: NEGATIVE mg/dL
Spec Grav, UA: 1.02 (ref 1.010–1.025)
Urobilinogen, UA: 0.2 E.U./dL
pH, UA: 5 (ref 5.0–8.0)

## 2020-04-09 LAB — POCT GLYCOSYLATED HEMOGLOBIN (HGB A1C): Hemoglobin A1C: 5.5 % (ref 4.0–5.6)

## 2020-04-09 LAB — GLUCOSE, POCT (MANUAL RESULT ENTRY): POC Glucose: 111 mg/dl — AB (ref 70–99)

## 2020-04-09 MED ORDER — CLOTRIMAZOLE-BETAMETHASONE 1-0.05 % EX CREA: 1.0000 "application " | TOPICAL_CREAM | Freq: Two times a day (BID) | CUTANEOUS | 1 refills | Status: DC

## 2020-04-09 NOTE — Progress Notes (Signed)
Aaron [name] 69 y.o.   Chief Complaint  Patient presents with  . genital rash    Pt stated that he has had a rash on his genital are for the past 4days that are red and blistery.    HISTORY OF PRESENT ILLNESS: This is a 69 y.o. male diabetic complaining of red rash to glans of his penis that started about 4 days ago.  No other significant symptoms.  HPI   Prior to Admission medications   Medication Sig Start Date End Date Taking? Authorizing Provider  amLODipine (NORVASC) 5 MG tablet Take 1 tablet (5 mg total) by mouth daily. 06/26/19  Yes Saurav Crumble, Ines Bloomer, MD  atorvastatin (LIPITOR) 40 MG tablet Take 40 mg by mouth daily.   Yes [provider]  dapagliflozin propanediol (FARXIGA) 5 MG TABS tablet Take by mouth daily.   Yes [provider]  fluocinolone (VANOS) 0.01 % cream Apply topically 2 (two) times daily. 02/05/20  Yes Weaver Tweed, Ines Bloomer, MD  glipiZIDE (GLUCOTROL) 5 MG tablet TAKE 1 TABLET(5 MG) BY MOUTH DAILY BEFORE BREAKFAST 03/05/20  Yes [name], MD  metFORMIN (GLUCOPHAGE-XR) 500 MG 24 hr tablet TAKE 1 TABLET BY MOUTH DAILY AT 8 PM 03/18/20  Yes Mohamadou Maciver, Ines Bloomer, MD    Allergies  Allergen Reactions  . Erythromycin Itching  . Levaquin [Levofloxacin In D5w]     Light sensitivity     Patient Active Problem List   Diagnosis Date Noted  . Morning headache 09/11/2019  . NDPH (new daily persistent headache) 07/12/2019  . Hypertension associated with diabetes ([place]) 05/29/2019  . Gastroesophageal reflux disease without esophagitis 03/13/2017  . Dyslipidemia associated with type 2 diabetes mellitus ([place]) 03/13/2017    Past Medical History:  Diagnosis Date  . Diabetes mellitus without complication ([place])   . GERD (gastroesophageal reflux disease)   . Hyperlipidemia   . Hypertension     History reviewed. No pertinent surgical history.  Social History   Socioeconomic History  . Marital status: Married    Spouse name: Not  on file  . Number of children: 0  . Years of education: Not on file  . Highest education level: High school graduate  Occupational History  . Occupation: Sales  Tobacco Use  . Smoking status: Former Smoker    Types: Cigarettes    Quit date: 1985    Years since quitting: 36.6  . Smokeless tobacco: Never Used  Vaping Use  . Vaping Use: Never used  Substance and Sexual Activity  . Alcohol use: Yes    Alcohol/week: 7.0 standard drinks    Types: 7 Cans of beer per week    Comment: 1 beer with dinner usually daily  . Drug use: No  . Sexual activity: Yes  Other Topics Concern  . Not on file  Social History Narrative   Marital status:  Married.             Children:   none      Lives:      Employment:  Press photographer      Tobacco:  none      Alcohol:  About 7 drinks per week      Drugs:   none      Exercise:      Seatbelt:      Education: [employer].      Lives at home with wife   Right handed   Caffeine: 2 cups/day   Social Determinants of Radio broadcast assistant  Strain:   . Difficulty of Paying Living Expenses:   Food Insecurity:   . Worried About Charity fundraiser in the Last Year:   . Arboriculturist in the Last Year:   Transportation Needs:   . Film/video editor (Medical):   Marland Kitchen Lack of Transportation (Non-Medical):   Physical Activity:   . Days of Exercise per Week:   . Minutes of Exercise per Session:   Stress:   . Feeling of Stress :   Social Connections:   . Frequency of Communication with Friends and Family:   . Frequency of Social Gatherings with Friends and Family:   . Attends Religious Services:   . Active Member of Clubs or Organizations:   . Attends Archivist Meetings:   Marland Kitchen Marital Status:   Intimate Partner Violence:   . Fear of Current or Ex-Partner:   . Emotionally Abused:   Marland Kitchen Physically Abused:   . Sexually Abused:     Family History  Problem Relation Age of Onset  . Cancer Mother        bone marrow cancer  . Cancer  Father 14       brain cancer  . Migraines Neg Hx   . Headache Neg Hx      Review of Systems  Constitutional: Negative.  Negative for chills and fever.  HENT: Negative.  Negative for congestion and sore throat.   Respiratory: Negative.  Negative for cough and shortness of breath.   Cardiovascular: Negative.  Negative for chest pain and palpitations.  Gastrointestinal: Negative.  Negative for abdominal pain, diarrhea, nausea and vomiting.  Genitourinary: Negative.  Negative for dysuria and hematuria.  Musculoskeletal: Negative.  Negative for back pain, myalgias and neck pain.  Neurological: Negative for dizziness and headaches.  All other systems reviewed and are negative.  Today's Vitals   04/09/20 1552  BP: 138/73  Pulse: 86  Temp: 98 F (36.7 C)  TempSrc: Temporal  SpO2: 94%  Weight: 172 lb 6.4 oz (78.2 kg)  Height: 5\' 6"  (1.676 m)   Body mass index is 27.83 kg/m.   Physical Exam Vitals reviewed.  Constitutional:      Appearance: Normal appearance.  HENT:     Head: Normocephalic.  Eyes:     Extraocular Movements: Extraocular movements intact.     Pupils: Pupils are equal, round, and reactive to light.  Cardiovascular:     Rate and Rhythm: Normal rate and regular rhythm.     Pulses: Normal pulses.     Heart sounds: Normal heart sounds.  Pulmonary:     Effort: Pulmonary effort is normal.     Breath sounds: Normal breath sounds.  Abdominal:     Hernia: There is no hernia in the left inguinal area or right inguinal area.  Genitourinary:    Penis: Uncircumcised.      Testes: Normal.     Epididymis:     Right: Normal.     Left: Normal.     Comments: Mild erythematous rash to glans penis Musculoskeletal:     Cervical back: Normal range of motion and neck supple.  Lymphadenopathy:     Lower Body: No right inguinal adenopathy. No left inguinal adenopathy.  Skin:    General: Skin is warm and dry.     Capillary Refill: Capillary refill takes less than 2 seconds.       Findings: Lesion (Multiple lesions to his back) present.  Neurological:     General: No focal deficit  present.     Mental Status: He is alert and oriented to person, place, and time.  Psychiatric:        Mood and Affect: Mood normal.        Behavior: Behavior normal.    Results for orders placed or performed in visit on 04/09/20 (from the past 24 hour(s))  POCT glucose (manual entry)     Status: Abnormal   Collection Time: 04/09/20  4:03 PM  Result Value Ref Range   POC Glucose 111 (A) 70 - 99 mg/dl  POCT urinalysis dipstick     Status: Abnormal   Collection Time: 04/09/20  4:18 PM  Result Value Ref Range   Color, UA yellow yellow   Clarity, UA clear clear   Glucose, UA >=1,000 (A) negative mg/dL   Bilirubin, UA negative negative   Ketones, POC UA small (15) (A) negative mg/dL   Spec Grav, UA 1.020 1.010 - 1.025   Blood, UA negative negative   pH, UA 5.0 5.0 - 8.0   Protein Ur, POC negative negative mg/dL   Urobilinogen, UA 0.2 0.2 or 1.0 E.U./dL   Nitrite, UA Negative Negative   Leukocytes, UA Negative Negative  POCT glycosylated hemoglobin (Hb A1C)     Status: None   Collection Time: 04/09/20  4:23 PM  Result Value Ref Range   Hemoglobin A1C 5.5 4.0 - 5.6 %   HbA1c POC (<> result, manual entry)     HbA1c, POC (prediabetic range)     HbA1c, POC (controlled diabetic range)     A total of 30 minutes was spent with the patient, greater than 50% of which was in counseling/coordination of care regarding diabetes and cardiovascular risks associated with this condition, treatment of balanitis, review of all medications, review of most recent blood work results including today's hemoglobin A1c, diet and nutrition, review of most recent office visit notes, prognosis and need for follow-up.   ASSESSMENT & PLAN: Hypertension associated with diabetes ([place]) Much improved diabetes with hemoglobin A1c of 5.5.  Continue present medications.  No changes.  Most likely balanitis rash.   Will treat with Lotrisone twice a day for 7 days.  Diet and nutrition discussed. Well-controlled hypertension.  Continue present medications.  No changes. Follow-up in 3 to 6 months.  [place] was seen today for genital rash.  Diagnoses and all orders for this visit:  Rash of genital area -     POCT urinalysis dipstick -     clotrimazole-betamethasone (LOTRISONE) cream; Apply 1 application topically 2 (two) times daily.  Hypertension associated with diabetes ([place])  Dyslipidemia associated with type 2 diabetes mellitus ([place]) -     POCT urinalysis dipstick -     POCT glucose (manual entry) -     POCT glycosylated hemoglobin (Hb A1C)  Skin lesion -     Ambulatory referral to Dermatology    Patient Instructions       If you have lab work done today you will be contacted with your lab results within the next 2 weeks.  If you have not heard from [place] then please contact us. The fastest way to get your results is to register for My Chart.   IF you received an x-ray today, you will receive an invoice from [facility]. Please contact [facility] at [phone] with questions or concerns regarding your invoice.   IF you received labwork today, you will receive an invoice from [facility]. Please contact [facility] at [phone] with questions or concerns regarding your  invoice.   Our billing staff will not be able to assist you with questions regarding bills from these companies.  You will be contacted with the lab results as soon as they are available. The fastest way to get your results is to activate your My Chart account. Instructions are located on the last page of this paperwork. If you have not heard from [place] regarding the results in 2 weeks, please contact this office.     Diabetes Mellitus and Nutrition, Adult When you have diabetes (diabetes mellitus), it is very important to have healthy eating habits because your blood sugar (glucose) levels are greatly  affected by what you eat and drink. Eating healthy foods in the appropriate amounts, at about the same times every day, can help you:  Control your blood glucose.  Lower your risk of heart disease.  Improve your blood pressure.  Reach or maintain a healthy weight. Every person with diabetes is different, and each person has different needs for a meal plan. Your health care provider may recommend that you work with a diet and nutrition specialist (dietitian) to make a meal plan that is best for you. Your meal plan may vary depending on factors such as:  The calories you need.  The medicines you take.  Your weight.  Your blood glucose, blood pressure, and cholesterol levels.  Your activity level.  Other health conditions you have, such as heart or kidney disease. How do carbohydrates affect me? Carbohydrates, also called carbs, affect your blood glucose level more than any other type of food. Eating carbs naturally raises the amount of glucose in your blood. Carb counting is a method for keeping track of how many carbs you eat. Counting carbs is important to keep your blood glucose at a healthy level, especially if you use insulin or take certain oral diabetes medicines. It is important to know how many carbs you can safely have in each meal. This is different for every person. Your dietitian can help you calculate how many carbs you should have at each meal and for each snack. Foods that contain carbs include:  Bread, cereal, rice, pasta, and crackers.  Potatoes and corn.  Peas, beans, and lentils.  Milk and yogurt.  Fruit and juice.  Desserts, such as cakes, cookies, ice cream, and candy. How does alcohol affect me? Alcohol can cause a sudden decrease in blood glucose (hypoglycemia), especially if you use insulin or take certain oral diabetes medicines. Hypoglycemia can be a life-threatening condition. Symptoms of hypoglycemia (sleepiness, dizziness, and confusion) are similar  to symptoms of having too much alcohol. If your health care provider says that alcohol is safe for you, follow these guidelines:  Limit alcohol intake to no more than 1 drink per day for nonpregnant women and 2 drinks per day for men. One drink equals 12 oz of beer, 5 oz of wine, or 1 oz of hard liquor.  Do not drink on an empty stomach.  Keep yourself hydrated with water, diet soda, or unsweetened iced tea.  Keep in mind that regular soda, juice, and other mixers may contain a lot of sugar and must be counted as carbs. What are tips for following this plan?  Reading food labels  Start by checking the serving size on the "Nutrition Facts" label of packaged foods and drinks. The amount of calories, carbs, fats, and other nutrients listed on the label is based on one serving of the item. Many items contain more than one serving per package.  Check the total grams (g) of carbs in one serving. You can calculate the number of servings of carbs in one serving by dividing the total carbs by 15. For example, if a food has 30 g of total carbs, it would be equal to 2 servings of carbs.  Check the number of grams (g) of saturated and trans fats in one serving. Choose foods that have low or no amount of these fats.  Check the number of milligrams (mg) of salt (sodium) in one serving. Most people should limit total sodium intake to less than 2,300 mg per day.  Always check the nutrition information of foods labeled as "low-fat" or "nonfat". These foods may be higher in added sugar or refined carbs and should be avoided.  Talk to your dietitian to identify your daily goals for nutrients listed on the label. Shopping  Avoid buying canned, premade, or processed foods. These foods tend to be high in fat, sodium, and added sugar.  Shop around the outside edge of the grocery store. This includes fresh fruits and vegetables, bulk grains, fresh meats, and fresh dairy. Cooking  Use low-heat cooking  methods, such as baking, instead of high-heat cooking methods like deep frying.  Cook using healthy oils, such as olive, canola, or sunflower oil.  Avoid cooking with butter, cream, or high-fat meats. Meal planning  Eat meals and snacks regularly, preferably at the same times every day. Avoid going long periods of time without eating.  Eat foods high in fiber, such as fresh fruits, vegetables, beans, and whole grains. Talk to your dietitian about how many servings of carbs you can eat at each meal.  Eat 4-6 ounces (oz) of lean protein each day, such as lean meat, chicken, fish, eggs, or tofu. One oz of lean protein is equal to: ? 1 oz of meat, chicken, or fish. ? 1 egg. ?  cup of tofu.  Eat some foods each day that contain healthy fats, such as avocado, nuts, seeds, and fish. Lifestyle  Check your blood glucose regularly.  Exercise regularly as told by your health care provider. This may include: ? 150 minutes of moderate-intensity or vigorous-intensity exercise each week. This could be brisk walking, biking, or water aerobics. ? Stretching and doing strength exercises, such as yoga or weightlifting, at least 2 times a week.  Take medicines as told by your health care provider.  Do not use any products that contain nicotine or tobacco, such as cigarettes and e-cigarettes. If you need help quitting, ask your health care provider.  Work with a Social worker or diabetes educator to identify strategies to manage stress and any emotional and social challenges. Questions to ask a health care provider  Do I need to meet with a diabetes educator?  Do I need to meet with a dietitian?  What number can I call if I have questions?  When are the best times to check my blood glucose? Where to find more information:  American Diabetes Association: diabetes.org  Academy of Nutrition and Dietetics: www.eatright.[employer] of Diabetes and Digestive and Kidney Diseases (NIH):  [employer] Summary  A healthy meal plan will help you control your blood glucose and maintain a healthy lifestyle.  Working with a diet and nutrition specialist (dietitian) can help you make a meal plan that is best for you.  Keep in mind that carbohydrates (carbs) and alcohol have immediate effects on your blood glucose levels. It is important to count carbs and to use alcohol carefully. This  information is not intended to replace advice given to you by your health care provider. Make sure you discuss any questions you have with your health care provider. Document Revised: 07/30/2017 Document Reviewed: 09/21/2016 Elsevier Patient Education  2020 Elsevier Inc.      Agustina Caroli, MD [facility]

## 2020-04-09 NOTE — Patient Instructions (Addendum)
   If you have lab work done today you will be contacted with your lab results within the next 2 weeks.  If you have not heard from us then please contact us. The fastest way to get your results is to register for My Chart.   IF you received an x-ray today, you will receive an invoice from [facility]. Please contact [facility] at [phone] with questions or concerns regarding your invoice.   IF you received labwork today, you will receive an invoice from [facility]. Please contact [facility] at [phone] with questions or concerns regarding your invoice.   Our billing staff will not be able to assist you with questions regarding bills from these companies.  You will be contacted with the lab results as soon as they are available. The fastest way to get your results is to activate your My Chart account. Instructions are located on the last page of this paperwork. If you have not heard from us regarding the results in 2 weeks, please contact this office.      Diabetes Mellitus and Nutrition, Adult When you have diabetes (diabetes mellitus), it is very important to have healthy eating habits because your blood sugar (glucose) levels are greatly affected by what you eat and drink. Eating healthy foods in the appropriate amounts, at about the same times every day, can help you:  Control your blood glucose.  Lower your risk of heart disease.  Improve your blood pressure.  Reach or maintain a healthy weight. Every person with diabetes is different, and each person has different needs for a meal plan. Your health care provider may recommend that you work with a diet and nutrition specialist (dietitian) to make a meal plan that is best for you. Your meal plan may vary depending on factors such as:  The calories you need.  The medicines you take.  Your weight.  Your blood glucose, blood pressure, and cholesterol levels.  Your activity level.  Other health  conditions you have, such as heart or kidney disease. How do carbohydrates affect me? Carbohydrates, also called carbs, affect your blood glucose level more than any other type of food. Eating carbs naturally raises the amount of glucose in your blood. Carb counting is a method for keeping track of how many carbs you eat. Counting carbs is important to keep your blood glucose at a healthy level, especially if you use insulin or take certain oral diabetes medicines. It is important to know how many carbs you can safely have in each meal. This is different for every person. Your dietitian can help you calculate how many carbs you should have at each meal and for each snack. Foods that contain carbs include:  Bread, cereal, rice, pasta, and crackers.  Potatoes and corn.  Peas, beans, and lentils.  Milk and yogurt.  Fruit and juice.  Desserts, such as cakes, cookies, ice cream, and candy. How does alcohol affect me? Alcohol can cause a sudden decrease in blood glucose (hypoglycemia), especially if you use insulin or take certain oral diabetes medicines. Hypoglycemia can be a life-threatening condition. Symptoms of hypoglycemia (sleepiness, dizziness, and confusion) are similar to symptoms of having too much alcohol. If your health care provider says that alcohol is safe for you, follow these guidelines:  Limit alcohol intake to no more than 1 drink per day for nonpregnant women and 2 drinks per day for men. One drink equals 12 oz of beer, 5 oz of wine, or 1 oz of hard   liquor.  Do not drink on an empty stomach.  Keep yourself hydrated with water, diet soda, or unsweetened iced tea.  Keep in mind that regular soda, juice, and other mixers may contain a lot of sugar and must be counted as carbs. What are tips for following this plan?  Reading food labels  Start by checking the serving size on the "Nutrition Facts" label of packaged foods and drinks. The amount of calories, carbs, fats, and  other nutrients listed on the label is based on one serving of the item. Many items contain more than one serving per package.  Check the total grams (g) of carbs in one serving. You can calculate the number of servings of carbs in one serving by dividing the total carbs by 15. For example, if a food has 30 g of total carbs, it would be equal to 2 servings of carbs.  Check the number of grams (g) of saturated and trans fats in one serving. Choose foods that have low or no amount of these fats.  Check the number of milligrams (mg) of salt (sodium) in one serving. Most people should limit total sodium intake to less than 2,300 mg per day.  Always check the nutrition information of foods labeled as "low-fat" or "nonfat". These foods may be higher in added sugar or refined carbs and should be avoided.  Talk to your dietitian to identify your daily goals for nutrients listed on the label. Shopping  Avoid buying canned, premade, or processed foods. These foods tend to be high in fat, sodium, and added sugar.  Shop around the outside edge of the grocery store. This includes fresh fruits and vegetables, bulk grains, fresh meats, and fresh dairy. Cooking  Use low-heat cooking methods, such as baking, instead of high-heat cooking methods like deep frying.  Cook using healthy oils, such as olive, canola, or sunflower oil.  Avoid cooking with butter, cream, or high-fat meats. Meal planning  Eat meals and snacks regularly, preferably at the same times every day. Avoid going long periods of time without eating.  Eat foods high in fiber, such as fresh fruits, vegetables, beans, and whole grains. Talk to your dietitian about how many servings of carbs you can eat at each meal.  Eat 4-6 ounces (oz) of lean protein each day, such as lean meat, chicken, fish, eggs, or tofu. One oz of lean protein is equal to: ? 1 oz of meat, chicken, or fish. ? 1 egg. ?  cup of tofu.  Eat some foods each day that  contain healthy fats, such as avocado, nuts, seeds, and fish. Lifestyle  Check your blood glucose regularly.  Exercise regularly as told by your health care provider. This may include: ? 150 minutes of moderate-intensity or vigorous-intensity exercise each week. This could be brisk walking, biking, or water aerobics. ? Stretching and doing strength exercises, such as yoga or weightlifting, at least 2 times a week.  Take medicines as told by your health care provider.  Do not use any products that contain nicotine or tobacco, such as cigarettes and e-cigarettes. If you need help quitting, ask your health care provider.  Work with a counselor or diabetes educator to identify strategies to manage stress and any emotional and social challenges. Questions to ask a health care provider  Do I need to meet with a diabetes educator?  Do I need to meet with a dietitian?  What number can I call if I have questions?  When are the best   times to check my blood glucose? Where to find more information:  American Diabetes Association: diabetes.org  Academy of Nutrition and Dietetics: www.eatright.org  National Institute of Diabetes and Digestive and Kidney Diseases (NIH): www.niddk.nih.gov Summary  A healthy meal plan will help you control your blood glucose and maintain a healthy lifestyle.  Working with a diet and nutrition specialist (dietitian) can help you make a meal plan that is best for you.  Keep in mind that carbohydrates (carbs) and alcohol have immediate effects on your blood glucose levels. It is important to count carbs and to use alcohol carefully. This information is not intended to replace advice given to you by your health care provider. Make sure you discuss any questions you have with your health care provider. Document Revised: 07/30/2017 Document Reviewed: 09/21/2016 Elsevier Patient Education  2020 Elsevier Inc.  

## 2020-04-09 NOTE — Assessment & Plan Note (Signed)
Much improved diabetes with hemoglobin A1c of 5.5.  Continue present medications.  No changes.  Most likely balanitis rash.  Will treat with Lotrisone twice a day for 7 days.  Diet and nutrition discussed. Well-controlled hypertension.  Continue present medications.  No changes. Follow-up in 3 to 6 months.

## 2020-04-15 ENCOUNTER — Encounter: Payer: Self-pay | Admitting: Emergency Medicine

## 2020-04-19 ENCOUNTER — Encounter: Payer: Self-pay | Admitting: Emergency Medicine

## 2020-04-19 NOTE — Telephone Encounter (Signed)
Pt asking for advice if he should be taking any vitamins as he has been feeling very tired and run down but notes this is a new issue, would an OV or virtual for evaluation be more appropriate? Please advise

## 2020-04-22 NOTE — Telephone Encounter (Signed)
Feeling rundown can be a number of things including a viral illness.  Vitamins are okay to take but if he does not improve in the next 2 to 3 weeks we should see him in the office again.  Thanks.

## 2020-04-30 ENCOUNTER — Other Ambulatory Visit: Payer: Self-pay

## 2020-04-30 ENCOUNTER — Encounter: Payer: Self-pay | Admitting: Emergency Medicine

## 2020-04-30 ENCOUNTER — Ambulatory Visit (INDEPENDENT_AMBULATORY_CARE_PROVIDER_SITE_OTHER): Payer: Medicare Other | Admitting: Emergency Medicine

## 2020-04-30 VITALS — BP 129/77 | HR 71 | Temp 98.1°F | Resp 16 | Ht 67.0 in | Wt 170.0 lb

## 2020-04-30 DIAGNOSIS — E785 Hyperlipidemia, unspecified: Secondary | ICD-10-CM

## 2020-04-30 DIAGNOSIS — I10 Essential (primary) hypertension: Secondary | ICD-10-CM

## 2020-04-30 DIAGNOSIS — R531 Weakness: Secondary | ICD-10-CM

## 2020-04-30 DIAGNOSIS — I152 Hypertension secondary to endocrine disorders: Secondary | ICD-10-CM

## 2020-04-30 DIAGNOSIS — E1159 Type 2 diabetes mellitus with other circulatory complications: Secondary | ICD-10-CM | POA: Diagnosis not present

## 2020-04-30 DIAGNOSIS — R21 Rash and other nonspecific skin eruption: Secondary | ICD-10-CM

## 2020-04-30 DIAGNOSIS — E1169 Type 2 diabetes mellitus with other specified complication: Secondary | ICD-10-CM | POA: Diagnosis not present

## 2020-04-30 NOTE — Progress Notes (Addendum)
Aaron [name] 69 y.o.   Chief Complaint  Patient presents with  . Rash    per patient in the groin no itching for over 2 weeks  . Fatigue    per patient 2-3 weeks not sure if it is the diet    HISTORY OF PRESENT ILLNESS: This is a 69 y.o. male complaining of persistent intermittent rash to glans penis area for the past several weeks. Also complaining of feeling tired and fatigue for the past 2 to 3 weeks with night sweats. Presently taking 500 mg of Metformin daily and glipizide 5 mg in the morning.  Also taking 5 mg of Farxiga. No other significant associated symptoms.  HPI   Prior to Admission medications   Medication Sig Start Date End Date Taking? Authorizing Provider  amLODipine (NORVASC) 5 MG tablet Take 1 tablet (5 mg total) by mouth daily. 06/26/19  Yes Tsuyako Jolley, Ines Bloomer, MD  atorvastatin (LIPITOR) 40 MG tablet Take 40 mg by mouth daily.   Yes [provider]  clotrimazole-betamethasone (LOTRISONE) cream Apply 1 application topically 2 (two) times daily. 04/09/20  Yes Andre Swander, Ines Bloomer, MD  dapagliflozin propanediol (FARXIGA) 5 MG TABS tablet Take by mouth daily.   Yes [provider]  fluocinolone (VANOS) 0.01 % cream Apply topically 2 (two) times daily. 02/05/20  Yes Ciji Boston, Ines Bloomer, MD  glipiZIDE (GLUCOTROL) 5 MG tablet TAKE 1 TABLET(5 MG) BY MOUTH DAILY BEFORE BREAKFAST 03/05/20  Yes Anjolie Majer, Ines Bloomer, MD  metFORMIN (GLUCOPHAGE-XR) 500 MG 24 hr tablet TAKE 1 TABLET BY MOUTH DAILY AT 8 PM 03/18/20  Yes Abbigale Mcelhaney, Ines Bloomer, MD  Multiple Vitamin (MULTI-VITAMIN PO) Take by mouth daily.   Yes [provider]    Allergies  Allergen Reactions  . Erythromycin Itching  . Levaquin [Levofloxacin In D5w]     Light sensitivity     Patient Active Problem List   Diagnosis Date Noted  . Hypertension associated with diabetes ([place]) 05/29/2019  . Gastroesophageal reflux disease without esophagitis 03/13/2017  . Dyslipidemia associated  with type 2 diabetes mellitus ([place]) 03/13/2017    Past Medical History:  Diagnosis Date  . Diabetes mellitus without complication ([place])   . GERD (gastroesophageal reflux disease)   . Hyperlipidemia   . Hypertension     History reviewed. No pertinent surgical history.  Social History   Socioeconomic History  . Marital status: Married    Spouse name: Not on file  . Number of children: 0  . Years of education: Not on file  . Highest education level: High school graduate  Occupational History  . Occupation: Sales  Tobacco Use  . Smoking status: Former Smoker    Types: Cigarettes    Quit date: 1985    Years since quitting: 36.6  . Smokeless tobacco: Never Used  Vaping Use  . Vaping Use: Never used  Substance and Sexual Activity  . Alcohol use: Yes    Alcohol/week: 7.0 standard drinks    Types: 7 Cans of beer per week    Comment: 1 beer with dinner usually daily  . Drug use: No  . Sexual activity: Yes  Other Topics Concern  . Not on file  Social History Narrative   Marital status:  Married.             Children:   none      Lives:      Employment:  sales      Tobacco:  none      Alcohol:  About 7 drinks per week      Drugs:   none      Exercise:      Seatbelt:      Education: [employer].      Lives at home with wife   Right handed   Caffeine: 2 cups/day   Social Determinants of Health   Financial Resource Strain:   . Difficulty of Paying Living Expenses: Not on file  Food Insecurity:   . Worried About Charity fundraiser in the Last Year: Not on file  . Ran Out of Food in the Last Year: Not on file  Transportation Needs:   . Lack of Transportation (Medical): Not on file  . Lack of Transportation (Non-Medical): Not on file  Physical Activity:   . Days of Exercise per Week: Not on file  . Minutes of Exercise per Session: Not on file  Stress:   . Feeling of Stress : Not on file  Social Connections:   . Frequency of Communication with Friends and  Family: Not on file  . Frequency of Social Gatherings with Friends and Family: Not on file  . Attends Religious Services: Not on file  . Active Member of Clubs or Organizations: Not on file  . Attends Archivist Meetings: Not on file  . Marital Status: Not on file  Intimate Partner Violence:   . Fear of Current or Ex-Partner: Not on file  . Emotionally Abused: Not on file  . Physically Abused: Not on file  . Sexually Abused: Not on file    Family History  Problem Relation Age of Onset  . Cancer Mother        bone marrow cancer  . Cancer Father 49       brain cancer  . Migraines Neg Hx   . Headache Neg Hx      Review of Systems  Constitutional: Positive for malaise/fatigue. Negative for chills, fever and weight loss.  HENT: Negative.  Negative for congestion and sore throat.   Respiratory: Negative.  Negative for cough and shortness of breath.   Cardiovascular: Negative.  Negative for chest pain and palpitations.  Gastrointestinal: Negative.  Negative for abdominal pain, blood in stool, diarrhea, melena, nausea and vomiting.  Genitourinary: Negative.  Negative for dysuria and hematuria.  Musculoskeletal: Negative.  Negative for back pain, joint pain and myalgias.  Skin: Negative.   Neurological: Negative.  Negative for dizziness and headaches.  All other systems reviewed and are negative.   Vitals:   04/30/20 1453  BP: 129/77  Pulse: 71  Resp: 16  Temp: 98.1 F (36.7 C)  SpO2: 96%   Wt Readings from Last 3 Encounters:  04/30/20 170 lb (77.1 kg)  04/09/20 172 lb 6.4 oz (78.2 kg)  03/01/20 178 lb 9.6 oz (81 kg)    Physical Exam Vitals reviewed.  Constitutional:      Appearance: Normal appearance.  HENT:     Head: Normocephalic.  Eyes:     Extraocular Movements: Extraocular movements intact.     Pupils: Pupils are equal, round, and reactive to light.  Cardiovascular:     Rate and Rhythm: Normal rate and regular rhythm.     Pulses: Normal pulses.      Heart sounds: Normal heart sounds.  Pulmonary:     Effort: Pulmonary effort is normal.     Breath sounds: Normal breath sounds.  Abdominal:     Hernia: There is no hernia in the left inguinal area or right  inguinal area.  Genitourinary:    Penis: Normal and uncircumcised.      Testes: Normal.     Epididymis:     Right: Normal.     Left: Normal.  Musculoskeletal:        General: Normal range of motion.     Cervical back: Normal range of motion.  Lymphadenopathy:     Lower Body: No right inguinal adenopathy. No left inguinal adenopathy.  Skin:    General: Skin is warm and dry.     Capillary Refill: Capillary refill takes less than 2 seconds.  Neurological:     General: No focal deficit present.     Mental Status: He is alert and oriented to person, place, and time.  Psychiatric:        Mood and Affect: Mood normal.        Behavior: Behavior normal.      ASSESSMENT & PLAN: Hypertension associated with diabetes ([place]) Well-controlled hypertension. Patient with recurrent suspected mycotic infection of genitalia.  We will stop [place]. Continue Metformin and glipizide. Lab Results  Component Value Date   HGBA1C 5.5 04/09/2020      Aaron [name] was seen today for rash and fatigue.  Diagnoses and all orders for this visit:  General weakness -     TestT+TestF+SHBG -     TSH -     CBC with Differential/Platelet -     Comprehensive metabolic panel  Rash of genital area  Dyslipidemia associated with type 2 diabetes mellitus [facility])    Patient Instructions    Stop [place].  Continue Metformin and glipizide.   If you have lab work done today you will be contacted with your lab results within the next 2 weeks.  If you have not heard from [place] then please contact us. The fastest way to get your results is to register for My Chart.   IF you received an x-ray today, you will receive an invoice from [facility]. Please contact [facility] at [phone] with  questions or concerns regarding your invoice.   IF you received labwork today, you will receive an invoice from [facility]. Please contact [facility] at [phone] with questions or concerns regarding your invoice.   Our billing staff will not be able to assist you with questions regarding bills from these companies.  You will be contacted with the lab results as soon as they are available. The fastest way to get your results is to activate your My Chart account. Instructions are located on the last page of this paperwork. If you have not heard from [place] regarding the results in 2 weeks, please contact this office.     Fatigue If you have fatigue, you feel tired all the time and have a lack of energy or a lack of motivation. Fatigue may make it difficult to start or complete tasks because of exhaustion. In general, occasional or mild fatigue is often a normal response to activity or life. However, long-lasting (chronic) or extreme fatigue may be a symptom of a medical condition. Follow these instructions at home: General instructions  Watch your fatigue for any changes.  Go to bed and get up at the same time every day.  Avoid fatigue by pacing yourself during the day and getting enough sleep at night.  Maintain a healthy weight. Medicines  Take over-the-counter and prescription medicines only as told by your health care provider.  Take a multivitamin, if told by your health care provider.  Do not use herbal or dietary  supplements unless they are approved by your health care provider. Activity   Exercise regularly, as told by your health care provider.  Use or practice techniques to help you relax, such as yoga, tai chi, meditation, or massage therapy. Eating and drinking   Avoid heavy meals in the evening.  Eat a well-balanced diet, which includes lean proteins, whole grains, plenty of fruits and vegetables, and low-fat dairy products.  Avoid consuming too much  caffeine.  Avoid the use of alcohol.  Drink enough fluid to keep your urine pale yellow. Lifestyle  Change situations that cause you stress. Try to keep your work and personal schedule in balance.  Do not use any products that contain nicotine or tobacco, such as cigarettes and e-cigarettes. If you need help quitting, ask your health care provider.  Do not use drugs. Contact a health care provider if:  Your fatigue does not get better.  You have a fever.  You suddenly lose or gain weight.  You have headaches.  You have trouble falling asleep or sleeping through the night.  You feel angry, guilty, anxious, or sad.  You are unable to have a bowel movement (constipation).  Your skin is dry.  You have swelling in your legs or another part of your body. Get help right away if:  You feel confused.  Your vision is blurry.  You feel faint or you pass out.  You have a severe headache.  You have severe pain in your abdomen, your back, or the area between your waist and hips (pelvis).  You have chest pain, shortness of breath, or an irregular or fast heartbeat.  You are unable to urinate, or you urinate less than normal.  You have abnormal bleeding, such as bleeding from the rectum, vagina, nose, lungs, or nipples.  You vomit blood.  You have thoughts about hurting yourself or others. If you ever feel like you may hurt yourself or others, or have thoughts about taking your own life, get help right away. You can go to your nearest emergency department or call:  Your local emergency services (911 in the [place]  A suicide crisis helpline, such as the [place] [facility] at 386-850-2583. This is open 24 hours a day. Summary  If you have fatigue, you feel tired all the time and have a lack of energy or a lack of motivation.  Fatigue may make it difficult to start or complete tasks because of exhaustion.  Long-lasting (chronic) or extreme fatigue may  be a symptom of a medical condition.  Exercise regularly, as told by your health care provider.  Change situations that cause you stress. Try to keep your work and personal schedule in balance. This information is not intended to replace advice given to you by your health care provider. Make sure you discuss any questions you have with your health care provider. Document Revised: 03/08/2019 Document Reviewed: 05/12/2017 Elsevier Patient Education  2020 Elsevier Inc.       Agustina Caroli, MD [facility]

## 2020-04-30 NOTE — Patient Instructions (Addendum)
Stop [place].  Continue Metformin and glipizide.   If you have lab work done today you will be contacted with your lab results within the next 2 weeks.  If you have not heard from [place] then please contact us. The fastest way to get your results is to register for My Chart.   IF you received an x-ray today, you will receive an invoice from [facility]. Please contact [facility] at [phone] with questions or concerns regarding your invoice.   IF you received labwork today, you will receive an invoice from [facility]. Please contact [facility] at [phone] with questions or concerns regarding your invoice.   Our billing staff will not be able to assist you with questions regarding bills from these companies.  You will be contacted with the lab results as soon as they are available. The fastest way to get your results is to activate your My Chart account. Instructions are located on the last page of this paperwork. If you have not heard from [place] regarding the results in 2 weeks, please contact this office.     Fatigue If you have fatigue, you feel tired all the time and have a lack of energy or a lack of motivation. Fatigue may make it difficult to start or complete tasks because of exhaustion. In general, occasional or mild fatigue is often a normal response to activity or life. However, long-lasting (chronic) or extreme fatigue may be a symptom of a medical condition. Follow these instructions at home: General instructions  Watch your fatigue for any changes.  Go to bed and get up at the same time every day.  Avoid fatigue by pacing yourself during the day and getting enough sleep at night.  Maintain a healthy weight. Medicines  Take over-the-counter and prescription medicines only as told by your health care provider.  Take a multivitamin, if told by your health care provider.  Do not use herbal or dietary supplements unless they are approved by your health care  provider. Activity   Exercise regularly, as told by your health care provider.  Use or practice techniques to help you relax, such as yoga, tai chi, meditation, or massage therapy. Eating and drinking   Avoid heavy meals in the evening.  Eat a well-balanced diet, which includes lean proteins, whole grains, plenty of fruits and vegetables, and low-fat dairy products.  Avoid consuming too much caffeine.  Avoid the use of alcohol.  Drink enough fluid to keep your urine pale yellow. Lifestyle  Change situations that cause you stress. Try to keep your work and personal schedule in balance.  Do not use any products that contain nicotine or tobacco, such as cigarettes and e-cigarettes. If you need help quitting, ask your health care provider.  Do not use drugs. Contact a health care provider if:  Your fatigue does not get better.  You have a fever.  You suddenly lose or gain weight.  You have headaches.  You have trouble falling asleep or sleeping through the night.  You feel angry, guilty, anxious, or sad.  You are unable to have a bowel movement (constipation).  Your skin is dry.  You have swelling in your legs or another part of your body. Get help right away if:  You feel confused.  Your vision is blurry.  You feel faint or you pass out.  You have a severe headache.  You have severe pain in your abdomen, your back, or the area between your waist and hips (pelvis).  You  have chest pain, shortness of breath, or an irregular or fast heartbeat.  You are unable to urinate, or you urinate less than normal.  You have abnormal bleeding, such as bleeding from the rectum, vagina, nose, lungs, or nipples.  You vomit blood.  You have thoughts about hurting yourself or others. If you ever feel like you may hurt yourself or others, or have thoughts about taking your own life, get help right away. You can go to your nearest emergency department or call:  Your local  emergency services (911 in the [place]  A suicide crisis helpline, such as the [facility] at (807)277-3576. This is open 24 hours a day. Summary  If you have fatigue, you feel tired all the time and have a lack of energy or a lack of motivation.  Fatigue may make it difficult to start or complete tasks because of exhaustion.  Long-lasting (chronic) or extreme fatigue may be a symptom of a medical condition.  Exercise regularly, as told by your health care provider.  Change situations that cause you stress. Try to keep your work and personal schedule in balance. This information is not intended to replace advice given to you by your health care provider. Make sure you discuss any questions you have with your health care provider. Document Revised: 03/08/2019 Document Reviewed: 05/12/2017 Elsevier Patient Education  2020 [employer].

## 2020-05-01 ENCOUNTER — Encounter: Payer: Self-pay | Admitting: Emergency Medicine

## 2020-05-01 NOTE — Assessment & Plan Note (Signed)
Well-controlled hypertension. Patient with recurrent suspected mycotic infection of genitalia.  We will stop [place]. Continue Metformin and glipizide. Lab Results  Component Value Date   HGBA1C 5.5 04/09/2020

## 2020-05-02 NOTE — Telephone Encounter (Signed)
This pt had Prevnar 13 03/25/2018 and Pneumovax 23 05/29/2019  Is he in need of a 3rd dose or should I let him know his course is complete?

## 2020-05-02 NOTE — Telephone Encounter (Signed)
Course is complete.  Thanks.

## 2020-05-03 ENCOUNTER — Encounter: Payer: Self-pay | Admitting: Emergency Medicine

## 2020-05-03 LAB — COMPREHENSIVE METABOLIC PANEL
ALT: 14 IU/L (ref 0–44)
AST: 19 IU/L (ref 0–40)
Albumin/Globulin Ratio: 1.8 (ref 1.2–2.2)
Albumin: 4.6 g/dL (ref 3.8–4.8)
Alkaline Phosphatase: 86 IU/L (ref 48–121)
BUN/Creatinine Ratio: 20 (ref 10–24)
BUN: 15 mg/dL (ref 8–27)
Bilirubin Total: 0.4 mg/dL (ref 0.0–1.2)
CO2: 24 mmol/L (ref 20–29)
Calcium: 9.6 mg/dL (ref 8.6–10.2)
Chloride: 101 mmol/L (ref 96–106)
Creatinine, Ser: 0.74 mg/dL — ABNORMAL LOW (ref 0.76–1.27)
GFR calc Af Amer: 110 mL/min/{1.73_m2} (ref >59)
GFR calc non Af Amer: 95 mL/min/{1.73_m2} (ref >59)
Globulin, Total: 2.5 g/dL (ref 1.5–4.5)
Glucose: 82 mg/dL (ref 65–99)
Potassium: 4.2 mmol/L (ref 3.5–5.2)
Sodium: 140 mmol/L (ref 134–144)
Total Protein: 7.1 g/dL (ref 6.0–8.5)

## 2020-05-03 LAB — CBC WITH DIFFERENTIAL/PLATELET
Basophils Absolute: 0.1 10*3/uL (ref 0.0–0.2)
Basos: 1 %
EOS (ABSOLUTE): 0 10*3/uL (ref 0.0–0.4)
Eos: 0 %
Hematocrit: 51.6 % — ABNORMAL HIGH (ref 37.5–51.0)
Hemoglobin: 17.4 g/dL (ref 13.0–17.7)
Immature Grans (Abs): 0 10*3/uL (ref 0.0–0.1)
Immature Granulocytes: 1 %
Lymphocytes Absolute: 2.1 10*3/uL (ref 0.7–3.1)
Lymphs: 27 %
MCH: 28.5 pg (ref 26.6–33.0)
MCHC: 33.7 g/dL (ref 31.5–35.7)
MCV: 85 fL (ref 79–97)
Monocytes Absolute: 0.5 10*3/uL (ref 0.1–0.9)
Monocytes: 7 %
Neutrophils Absolute: 5.2 10*3/uL (ref 1.4–7.0)
Neutrophils: 64 %
Platelets: 281 10*3/uL (ref 150–450)
RBC: 6.1 x10E6/uL — ABNORMAL HIGH (ref 4.14–5.80)
RDW: 16.2 % — ABNORMAL HIGH (ref 11.6–15.4)
WBC: 7.9 10*3/uL (ref 3.4–10.8)

## 2020-05-03 LAB — TESTT+TESTF+SHBG
Sex Hormone Binding: 28.8 nmol/L (ref 19.3–76.4)
Testosterone, Free: 13.4 pg/mL (ref 6.6–18.1)
Testosterone, Total, LC/MS: 331.8 ng/dL (ref 264.0–916.0)

## 2020-05-03 LAB — TSH: TSH: 2.75 u[IU]/mL (ref 0.450–4.500)

## 2020-05-04 ENCOUNTER — Other Ambulatory Visit: Payer: Self-pay | Admitting: Emergency Medicine

## 2020-05-04 DIAGNOSIS — D45 Polycythemia vera: Secondary | ICD-10-CM

## 2020-05-07 ENCOUNTER — Telehealth: Payer: Self-pay | Admitting: Hematology and Oncology

## 2020-05-07 NOTE — Telephone Encounter (Signed)
Received an urgent hem referral from Dr. Mitchel Honour for polycthemia vera. [name] has been cld and scheduled to see Dr. Alvy Bimler on 9/8 at 2pm. Pt aware toa rrive 30 minutes early.

## 2020-05-08 ENCOUNTER — Other Ambulatory Visit: Payer: Self-pay

## 2020-05-08 ENCOUNTER — Encounter: Payer: Self-pay | Admitting: Hematology and Oncology

## 2020-05-08 ENCOUNTER — Inpatient Hospital Stay: Payer: Medicare Other | Attending: Hematology and Oncology | Admitting: Hematology and Oncology

## 2020-05-08 DIAGNOSIS — D751 Secondary polycythemia: Secondary | ICD-10-CM | POA: Diagnosis not present

## 2020-05-08 DIAGNOSIS — L814 Other melanin hyperpigmentation: Secondary | ICD-10-CM | POA: Diagnosis not present

## 2020-05-08 DIAGNOSIS — K219 Gastro-esophageal reflux disease without esophagitis: Secondary | ICD-10-CM | POA: Diagnosis not present

## 2020-05-08 DIAGNOSIS — I10 Essential (primary) hypertension: Secondary | ICD-10-CM | POA: Diagnosis not present

## 2020-05-08 DIAGNOSIS — Z7984 Long term (current) use of oral hypoglycemic drugs: Secondary | ICD-10-CM | POA: Insufficient documentation

## 2020-05-08 DIAGNOSIS — E785 Hyperlipidemia, unspecified: Secondary | ICD-10-CM | POA: Insufficient documentation

## 2020-05-08 DIAGNOSIS — R61 Generalized hyperhidrosis: Secondary | ICD-10-CM | POA: Diagnosis not present

## 2020-05-08 DIAGNOSIS — Z79899 Other long term (current) drug therapy: Secondary | ICD-10-CM | POA: Diagnosis not present

## 2020-05-08 DIAGNOSIS — E119 Type 2 diabetes mellitus without complications: Secondary | ICD-10-CM | POA: Insufficient documentation

## 2020-05-08 DIAGNOSIS — Z87891 Personal history of nicotine dependence: Secondary | ICD-10-CM | POA: Diagnosis not present

## 2020-05-08 DIAGNOSIS — D1801 Hemangioma of skin and subcutaneous tissue: Secondary | ICD-10-CM | POA: Diagnosis not present

## 2020-05-08 DIAGNOSIS — D229 Melanocytic nevi, unspecified: Secondary | ICD-10-CM | POA: Diagnosis not present

## 2020-05-08 DIAGNOSIS — R634 Abnormal weight loss: Secondary | ICD-10-CM | POA: Diagnosis not present

## 2020-05-08 DIAGNOSIS — B356 Tinea cruris: Secondary | ICD-10-CM | POA: Diagnosis not present

## 2020-05-08 DIAGNOSIS — L400 Psoriasis vulgaris: Secondary | ICD-10-CM | POA: Diagnosis not present

## 2020-05-08 DIAGNOSIS — L819 Disorder of pigmentation, unspecified: Secondary | ICD-10-CM | POA: Diagnosis not present

## 2020-05-08 DIAGNOSIS — L821 Other seborrheic keratosis: Secondary | ICD-10-CM | POA: Diagnosis not present

## 2020-05-08 NOTE — Assessment & Plan Note (Signed)
The most likely cause of his erythrocytosis is likely secondary to slight dehydration I encouraged him to increase oral fluid intake before his next visit I will also check reticulocyte count and serum erythropoietin level Another possibility would be undiagnosed obstructive sleep apnea I will see him back in 3 weeks for further follow-up

## 2020-05-08 NOTE — Progress Notes (Signed)
[facility] NOTE  Patient Care Team: [name], MD as PCP - General (Internal Medicine)  CHIEF COMPLAINTS/PURPOSE OF CONSULTATION:  Erythrocytosis  HISTORY OF PRESENTING ILLNESS:  Aaron [name] 69 y.o. male is here because of elevated hemoglobin.  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  He never suffer from diagnosis of blood clot.  The patient denies weight loss or skin itching. The patient is not a smoker He admits he typically go to his physician office for fasting blood work and did not drink enough liquids recently He is concerned about malignancy because he is being referred to the cancer center. He has occasional night sweats  MEDICAL HISTORY:  Past Medical History:  Diagnosis Date  . Diabetes mellitus without complication ([place])   . GERD (gastroesophageal reflux disease)   . Hyperlipidemia   . Hypertension   . Psoriasis     SURGICAL HISTORY: History reviewed. No pertinent surgical history.  SOCIAL HISTORY: Social History   Socioeconomic History  . Marital status: Married     Spouse name: Not on file  . Number of children: 0  . Years of education: Not on file  . Highest education level: High school graduate  Occupational History  . Occupation: Press photographer, retired   Tobacco Use  . Smoking status: Former Smoker    Types: Cigarettes    Quit date: 1985    Years since quitting: 36.7  . Smokeless tobacco: Never Used  Vaping Use  . Vaping Use: Never used  Substance and Sexual Activity  . Alcohol use: Yes    Alcohol/week: 7.0 standard drinks    Types: 7 Cans of beer per week    Comment: 1 beer with dinner usually daily  . Drug use: No  . Sexual activity: Yes  Other Topics Concern  . Not on file  Social History Narrative   Marital status:  Married.             Children:   none      Lives:      Employment:  Press photographer      Tobacco:  none      Alcohol:  About 7 drinks per week      Drugs:   none      Exercise:      Seatbelt:      Education: [employer].      Lives at home with wife   Right handed   Caffeine: 2 cups/day   Social Determinants of Health   Financial Resource Strain:   . Difficulty of Paying Living Expenses: Not on file  Food Insecurity:   . Worried About Running  Out of Food in the Last Year: Not on file  . Ran Out of Food in the Last Year: Not on file  Transportation Needs:   . Lack of Transportation (Medical): Not on file  . Lack of Transportation (Non-Medical): Not on file  Physical Activity:   . Days of Exercise per Week: Not on file  . Minutes of Exercise per Session: Not on file  Stress:   . Feeling of Stress : Not on file  Social Connections:   . Frequency of Communication with Friends and Family: Not on file  . Frequency of Social Gatherings with Friends and Family: Not on file  . Attends Religious Services: Not on file  . Active Member of Clubs or Organizations: Not on file  . Attends Archivist Meetings: Not on file  . Marital Status: Not on file  Intimate Partner Violence:   . Fear of Current or Ex-Partner: Not  on file  . Emotionally Abused: Not on file  . Physically Abused: Not on file  . Sexually Abused: Not on file    FAMILY HISTORY: Family History  Problem Relation Age of Onset  . Cancer Mother        breast ca spread to bone  . Cancer Father 81       brain cancer  . Migraines Neg Hx   . Headache Neg Hx     ALLERGIES:  is allergic to erythromycin and levaquin [levofloxacin in d5w].  MEDICATIONS:  Current Outpatient Medications  Medication Sig Dispense Refill  . melatonin 5 MG TABS Take 5 mg by mouth.    Marland Kitchen amLODipine (NORVASC) 5 MG tablet Take 1 tablet (5 mg total) by mouth daily. 90 tablet 3  . atorvastatin (LIPITOR) 40 MG tablet Take 40 mg by mouth daily.    . clotrimazole-betamethasone (LOTRISONE) cream Apply 1 application topically 2 (two) times daily. 30 g 1  . fluocinolone (VANOS) 0.01 % cream Apply topically 2 (two) times daily. 30 g 3  . glipiZIDE (GLUCOTROL) 5 MG tablet TAKE 1 TABLET(5 MG) BY MOUTH DAILY BEFORE BREAKFAST 90 tablet 1  . metFORMIN (GLUCOPHAGE-XR) 500 MG 24 hr tablet TAKE 1 TABLET BY MOUTH DAILY AT 8 PM 180 tablet 0  . Multiple Vitamin (MULTI-VITAMIN PO) Take by mouth daily.     No current facility-administered medications for this visit.    REVIEW OF SYSTEMS:   Constitutional: Denies fevers, chills  Eyes: Denies blurriness of vision, double vision or watery eyes Ears, nose, mouth, throat, and face: Denies mucositis or sore throat Respiratory: Denies cough, dyspnea or wheezes Cardiovascular: Denies palpitation, chest discomfort or lower extremity swelling Gastrointestinal:  Denies nausea, heartburn or change in bowel habits Lymphatics: Denies new lymphadenopathy or easy bruising Neurological:Denies numbness, tingling or new weaknesses Behavioral/Psych: Mood is stable, no new changes  All other systems were reviewed with the patient and are negative.  PHYSICAL EXAMINATION: ECOG PERFORMANCE STATUS: 0 - Asymptomatic  Vitals:   05/08/20 1403  BP:  133/63  Pulse: 75  Resp: 18  Temp: 97.9 F (36.6 C)  SpO2: 97%   Filed Weights   05/08/20 1403  Weight: 168 lb 12.8 oz (76.6 kg)    GENERAL:alert, no distress and comfortable SKIN: Noted psoriatic plaque EYES: normal, conjunctiva are pink and non-injected, sclera clear OROPHARYNX:no exudate, no erythema and lips, buccal mucosa, and tongue normal  NECK: supple, thyroid normal size, non-tender, without nodularity LYMPH:  no palpable lymphadenopathy in the cervical, axillary or inguinal LUNGS: clear to auscultation and  percussion with normal breathing effort HEART: regular rate & rhythm and no murmurs and no lower extremity edema ABDOMEN:abdomen soft, non-tender and normal bowel sounds Musculoskeletal:no cyanosis of digits and no clubbing  PSYCH: alert & oriented x 3 with fluent speech NEURO: no focal motor/sensory deficits  LABORATORY DATA:  I have reviewed the data as listed Recent Results (from the past 2160 hour(s))  CBC     Status: Abnormal   Collection Time: 02/23/20 10:19 AM  Result Value Ref Range   WBC 6.3 3.4 - 10.8 x10E3/uL   RBC 6.05 (H) 4.14 - 5.80 x10E6/uL   Hemoglobin 15.9 13.0 - 17.7 g/dL   Hematocrit 50.6 37.5 - 51.0 %   MCV 84 79 - 97 fL   MCH 26.3 (L) 26.6 - 33.0 pg   MCHC 31.4 (L) 31 - 35 g/dL   RDW 12.8 11.6 - 15.4 %   Platelets 292 150 - 450 R42H0/WC  Basic metabolic panel     Status: None   Collection Time: 02/23/20 10:19 AM  Result Value Ref Range   Glucose 89 65 - 99 mg/dL   BUN 14 8 - 27 mg/dL   Creatinine, Ser 0.86 0.76 - 1.27 mg/dL   GFR calc non Af Amer 90 >59 mL/min/1.73   GFR calc Af Amer 104 >59 mL/min/1.73    Comment: **[facility] currently reports eGFR in compliance with the current**   recommendations of the [employer]. [facility] will   update reporting as new guidelines are published from the NK[employer]-ASN   Task force.    BUN/Creatinine Ratio 16 10 - 24   Sodium 139 134 - 144 mmol/L   Potassium 4.2 3.5 - 5.2 mmol/L    Chloride 102 96 - 106 mmol/L   CO2 21 20 - 29 mmol/L   Calcium 9.3 8.6 - 10.2 mg/dL  TSH     Status: None   Collection Time: 02/23/20 10:19 AM  Result Value Ref Range   TSH 2.220 0.450 - 4.500 uIU/mL  Sedimentation Rate     Status: None   Collection Time: 02/23/20 10:19 AM  Result Value Ref Range   Sed Rate 15 0 - 30 mm/hr  POCT glucose (manual entry)     Status: Abnormal   Collection Time: 04/09/20  4:03 PM  Result Value Ref Range   POC Glucose 111 (A) 70 - 99 mg/dl  POCT urinalysis dipstick     Status: Abnormal   Collection Time: 04/09/20  4:18 PM  Result Value Ref Range   Color, UA yellow yellow   Clarity, UA clear clear   Glucose, UA >=1,000 (A) negative mg/dL   Bilirubin, UA negative negative   Ketones, POC UA small (15) (A) negative mg/dL   Spec Grav, UA 1.020 1.010 - 1.025   Blood, UA negative negative   pH, UA 5.0 5.0 - 8.0   Protein Ur, POC negative negative mg/dL   Urobilinogen, UA 0.2 0.2 or 1.0 E.U./dL   Nitrite, UA Negative Negative   Leukocytes, UA Negative Negative  POCT glycosylated hemoglobin (Hb A1C)     Status: None   Collection Time: 04/09/20  4:23 PM  Result Value Ref Range   Hemoglobin A1C 5.5 4.0 - 5.6 %   HbA1c POC (<> result, manual entry)     HbA1c, POC (prediabetic range)     HbA1c, POC (controlled diabetic range)    TestT+TestF+SHBG     Status: None   Collection Time: 04/30/20  3:46 PM  Result Value Ref Range  Testosterone, Total, LC/MS 331.8 264.0 - 916.0 ng/dL    Comment: This [facility] LC/MS-MS method is currently certified by the [employer] Program ([employer]). Adult male reference interval is based on a population of healthy nonobese males (BMI <30) between 8 and 16 years old. [place], [place] [phone]. PMID: [id number].    Testosterone, Free 13.4 6.6 - 18.1 pg/mL   Sex Hormone Binding 28.8 19.3 - 76.4 nmol/L  TSH     Status: None   Collection Time: 04/30/20  3:46 PM  Result Value Ref Range   TSH 2.750 0.450  - 4.500 uIU/mL  CBC with Differential/Platelet     Status: Abnormal   Collection Time: 04/30/20  3:46 PM  Result Value Ref Range   WBC 7.9 3.4 - 10.8 x10E3/uL   RBC 6.10 (H) 4.14 - 5.80 x10E6/uL   Hemoglobin 17.4 13.0 - 17.7 g/dL   Hematocrit 51.6 (H) 37.5 - 51.0 %   MCV 85 79 - 97 fL   MCH 28.5 26.6 - 33.0 pg   MCHC 33.7 31 - 35 g/dL   RDW 16.2 (H) 11.6 - 15.4 %   Platelets 281 150 - 450 x10E3/uL   Neutrophils 64 Not Estab. %   Lymphs 27 Not Estab. %   Monocytes 7 Not Estab. %   Eos 0 Not Estab. %   Basos 1 Not Estab. %   Neutrophils Absolute 5.2 1 - 7 x10E3/uL   Lymphocytes Absolute 2.1 0 - 3 x10E3/uL   Monocytes Absolute 0.5 0 - 0 x10E3/uL   EOS (ABSOLUTE) 0.0 0.0 - 0.4 x10E3/uL   Basophils Absolute 0.1 0 - 0 x10E3/uL   Immature Granulocytes 1 Not Estab. %   Immature Grans (Abs) 0.0 0.0 - 0.1 x10E3/uL  Comprehensive metabolic panel     Status: Abnormal   Collection Time: 04/30/20  3:46 PM  Result Value Ref Range   Glucose 82 65 - 99 mg/dL   BUN 15 8 - 27 mg/dL   Creatinine, Ser 0.74 (L) 0.76 - 1.27 mg/dL   GFR calc non Af Amer 95 >59 mL/min/1.73   GFR calc Af Amer 110 >59 mL/min/1.73    Comment: **[facility] currently reports eGFR in compliance with the current**   recommendations of the [employer]. [facility] will   update reporting as new guidelines are published from the NKF-ASN   Task force.    BUN/Creatinine Ratio 20 10 - 24   Sodium 140 134 - 144 mmol/L   Potassium 4.2 3.5 - 5.2 mmol/L   Chloride 101 96 - 106 mmol/L   CO2 24 20 - 29 mmol/L   Calcium 9.6 8.6 - 10.2 mg/dL   Total Protein 7.1 6.0 - 8.5 g/dL   Albumin 4.6 3.8 - 4.8 g/dL   Globulin, Total 2.5 1.5 - 4.5 g/dL   Albumin/Globulin Ratio 1.8 1.2 - 2.2   Bilirubin Total 0.4 0.0 - 1.2 mg/dL   Alkaline Phosphatase 86 48 - 121 IU/L    Comment: **Effective May 13, 2020 Alkaline Phosphatase**   reference interval will be changing to:              Age                Male          Male            0 -  5 days         23 - 127       34 -  127           6 - 10 days         29 - 242       29 - 242          11 - 20 days        109 - 357      109 - 357          21 - 30 days         94 - 494       94 - 494           1 -  2 months      149 - 539      149 - 539           3 -  6 months      131 - 452      131 - 452           7 - 11 months      117 - 401      117 - 401   12 months -  6 years       158 - 369      158 - 369           7 - 12 years       150 - 409      150 - 409               13 years       25 - 435       66 - 227               14 years       10 - 375       50 - 161               15 years        53 - 279       7 - 134               16 years        78 - 207       60 - 121               17 years        80 - 161       50 - 113          61 - 20 years        64 - 125       15 - 106              >20 years         73 - 121       44 - 121    AST 19 0 - 40 IU/L   ALT 14 0 - 44 IU/L    RADIOGRAPHIC STUDIES: I have reviewed his prior CT imaging and MRI brain I have personally reviewed the radiological images as listed and agreed with the findings in the report.  ASSESSMENT & PLAN Erythrocytosis The most likely cause of his erythrocytosis is likely secondary to slight dehydration I encouraged him to increase oral fluid intake before his next visit I will also check reticulocyte count and serum erythropoietin level Another possibility would be undiagnosed obstructive sleep apnea I will see him back in 3 weeks for further follow-up   Weight loss, non-intentional  He has significant intentional weight loss through aggressive dietary modification which is nonsustainable in the long-term We have extensive discussion about different dietary choices, lifestyle modification and resources for him to look into I do not believe this represent malignancy His examination today is benign  Night sweats He has intermittent night sweats and is concerned about this I am wondering whether  he is getting hypoglycemic from significant weight loss and dietary changes I will reevaluate in his next visit His recent TSH and testosterone level were normal  Orders Placed This Encounter  Procedures  . CBC with Differential/Platelet    Standing Status:   Standing    Number of Occurrences:   22    Standing Expiration Date:   05/08/2021  . Reticulocytes ([facility])    Standing Status:   Future    Standing Expiration Date:   05/08/2021  . Erythropoietin    Standing Status:   Future    Standing Expiration Date:   05/08/2021     The total time spent in the appointment was 55 minutes encounter with patients including review of chart and various tests results, discussions about plan of care and coordination of care plan   All questions were answered. The patient knows to call the clinic with any problems, questions or concerns. No barriers to learning was detected.  Heath Lark, MD 9/8/20213:15 PM

## 2020-05-08 NOTE — Assessment & Plan Note (Signed)
He has significant intentional weight loss through aggressive dietary modification which is nonsustainable in the long-term We have extensive discussion about different dietary choices, lifestyle modification and resources for him to look into I do not believe this represent malignancy His examination today is benign

## 2020-05-08 NOTE — Assessment & Plan Note (Addendum)
He has intermittent night sweats and is concerned about this I am wondering whether he is getting hypoglycemic from significant weight loss and dietary changes I will reevaluate in his next visit His recent TSH and testosterone level were normal

## 2020-05-27 ENCOUNTER — Ambulatory Visit: Payer: Medicare Other | Admitting: Emergency Medicine

## 2020-05-27 DIAGNOSIS — E119 Type 2 diabetes mellitus without complications: Secondary | ICD-10-CM | POA: Diagnosis not present

## 2020-05-27 LAB — HM DIABETES EYE EXAM

## 2020-05-28 ENCOUNTER — Encounter: Payer: Self-pay | Admitting: Emergency Medicine

## 2020-05-29 ENCOUNTER — Encounter: Payer: Self-pay | Admitting: Hematology and Oncology

## 2020-05-29 ENCOUNTER — Inpatient Hospital Stay (HOSPITAL_BASED_OUTPATIENT_CLINIC_OR_DEPARTMENT_OTHER): Payer: Medicare Other | Admitting: Hematology and Oncology

## 2020-05-29 ENCOUNTER — Inpatient Hospital Stay: Payer: Medicare Other

## 2020-05-29 ENCOUNTER — Other Ambulatory Visit: Payer: Self-pay

## 2020-05-29 DIAGNOSIS — D751 Secondary polycythemia: Secondary | ICD-10-CM | POA: Diagnosis not present

## 2020-05-29 DIAGNOSIS — I10 Essential (primary) hypertension: Secondary | ICD-10-CM | POA: Diagnosis not present

## 2020-05-29 DIAGNOSIS — R634 Abnormal weight loss: Secondary | ICD-10-CM | POA: Diagnosis not present

## 2020-05-29 DIAGNOSIS — R61 Generalized hyperhidrosis: Secondary | ICD-10-CM | POA: Diagnosis not present

## 2020-05-29 DIAGNOSIS — E119 Type 2 diabetes mellitus without complications: Secondary | ICD-10-CM | POA: Diagnosis not present

## 2020-05-29 DIAGNOSIS — E785 Hyperlipidemia, unspecified: Secondary | ICD-10-CM | POA: Diagnosis not present

## 2020-05-29 LAB — CBC WITH DIFFERENTIAL/PLATELET
Abs Immature Granulocytes: 0.01 10*3/uL (ref 0.00–0.07)
Basophils Absolute: 0 10*3/uL (ref 0.0–0.1)
Basophils Relative: 1 %
Eosinophils Absolute: 0 10*3/uL (ref 0.0–0.5)
Eosinophils Relative: 1 %
HCT: 48.8 % (ref 39.0–52.0)
Hemoglobin: 15.7 g/dL (ref 13.0–17.0)
Immature Granulocytes: 0 %
Lymphocytes Relative: 29 %
Lymphs Abs: 1.9 10*3/uL (ref 0.7–4.0)
MCH: 27 pg (ref 26.0–34.0)
MCHC: 32.2 g/dL (ref 30.0–36.0)
MCV: 84 fL (ref 80.0–100.0)
Monocytes Absolute: 0.6 10*3/uL (ref 0.1–1.0)
Monocytes Relative: 9 %
Neutro Abs: 4 10*3/uL (ref 1.7–7.7)
Neutrophils Relative %: 60 %
Platelets: 284 10*3/uL (ref 150–400)
RBC: 5.81 MIL/uL (ref 4.22–5.81)
RDW: 15 % (ref 11.5–15.5)
WBC: 6.6 10*3/uL (ref 4.0–10.5)
nRBC: 0 % (ref 0.0–0.2)

## 2020-05-29 NOTE — Progress Notes (Signed)
[facility] OFFICE PROGRESS NOTE  Sagardia, Ines Bloomer, MD  ASSESSMENT & PLAN:  Erythrocytosis This has resolved His recent erythrocytosis could be due to dehydration Serum erythropoietin level is pending I will call him with test results I suspect the result would be normal.  If so, he does not need further follow-up, investigation appointment to come back If EPO level is high, it could mean secondary erythrocytosis such as from undiagnosed obstructive sleep apnea However, if EPO level is suppressed, it could mean myeloproliferative disorder although this is unlikely given his normal CBC today   No orders of the defined types were placed in this encounter.   The total time spent in the appointment was 15 minutes encounter with patients including review of chart and various tests results, discussions about plan of care and coordination of care plan   All questions were answered. The patient knows to call the clinic with any problems, questions or concerns. No barriers to learning was detected.    Heath Lark, MD 9/29/20213:16 PM  INTERVAL HISTORY: JENNY OMDAHL 69 y.o. 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  He never suffer from diagnosis of blood clot.  The patient denies weight loss or skin itching. The patient is not a smoker He admits he typically go to his physician office for fasting blood work and did not drink enough liquids recently He is concerned about malignancy because he is being referred to the cancer center. He has occasional night sweats His CBC level on 05/29/2010 came back normal  I have reviewed the past medical history, past surgical history, social history and family history with the patient and they are unchanged from previous note.  ALLERGIES:  is allergic to erythromycin and levaquin [levofloxacin in d5w].  MEDICATIONS:  Current Outpatient Medications  Medication Sig Dispense Refill  . amLODipine (NORVASC) 5 MG tablet Take 1 tablet (5 mg total) by mouth daily. 90 tablet 3  . fluocinolone (VANOS) 0.01 % cream Apply topically 2 (two) times daily. 30 g 3  . glipiZIDE (GLUCOTROL) 5 MG tablet TAKE 1 TABLET(5 MG) BY MOUTH DAILY BEFORE BREAKFAST 90 tablet 1  . melatonin 5 MG TABS Take 5 mg by mouth.    . metFORMIN (GLUCOPHAGE-XR) 500 MG 24 hr tablet TAKE 1 TABLET BY MOUTH DAILY AT 8 PM 180 tablet 0  . Multiple Vitamin (MULTI-VITAMIN PO) Take by mouth daily.     No current facility-administered medications for this visit.     REVIEW OF SYSTEMS:   Eyes: Denies blurriness of vision Ears, nose, mouth, throat,  and face: Denies mucositis or sore throat Respiratory: Denies cough, dyspnea or wheezes Cardiovascular: Denies palpitation, chest discomfort or lower extremity swelling Gastrointestinal:  Denies nausea, heartburn or change in bowel habits Skin: Denies abnormal skin rashes Lymphatics: Denies new lymphadenopathy or easy bruising Neurological:Denies numbness, tingling or new  weaknesses Behavioral/Psych: Mood is stable, no new changes  All other systems were reviewed with the patient and are negative.  PHYSICAL EXAMINATION: ECOG PERFORMANCE STATUS: 1 - Symptomatic but completely ambulatory  Vitals:   05/29/20 1101  BP: 134/66  Pulse: 68  Resp: 18  Temp: 97.7 F (36.5 C)  SpO2: 100%   Filed Weights   05/29/20 1101  Weight: 169 lb 9.6 oz (76.9 kg)    GENERAL:alert, no distress and comfortable NEURO: alert & oriented x 3 with fluent speech, no focal motor/sensory deficits  LABORATORY DATA:  I have reviewed the data as listed     Component Value Date/Time   NA 140 04/30/2020 1546   K 4.2 04/30/2020 1546   CL 101 04/30/2020 1546   CO2 24 04/30/2020 1546   GLUCOSE 82 04/30/2020 1546   GLUCOSE 147 (H) 04/29/2015 1339   BUN 15 04/30/2020 1546   CREATININE 0.74 (L) 04/30/2020 1546   CREATININE 0.71 04/29/2015 1339   CALCIUM 9.6 04/30/2020 1546   PROT 7.1 04/30/2020 1546   ALBUMIN 4.6 04/30/2020 1546   AST 19 04/30/2020 1546   ALT 14 04/30/2020 1546   ALKPHOS 86 04/30/2020 1546   BILITOT 0.4 04/30/2020 1546   GFRNONAA 95 04/30/2020 1546   GFRNONAA >89 04/29/2015 1339   GFRAA 110 04/30/2020 1546   GFRAA >89 04/29/2015 1339    No results found for: SPEP, UPEP  Lab Results  Component Value Date   WBC 6.6 05/29/2020   NEUTROABS 4.0 05/29/2020   HGB 15.7 05/29/2020   HCT 48.8 05/29/2020   MCV 84.0 05/29/2020   PLT 284 05/29/2020      Chemistry      Component Value Date/Time   NA 140 04/30/2020 1546   K 4.2 04/30/2020 1546   CL 101 04/30/2020 1546   CO2 24 04/30/2020 1546   BUN 15 04/30/2020 1546   CREATININE 0.74 (L) 04/30/2020 1546   CREATININE 0.71 04/29/2015 1339      Component Value Date/Time   CALCIUM 9.6 04/30/2020 1546   ALKPHOS 86 04/30/2020 1546   AST 19 04/30/2020 1546   ALT 14 04/30/2020 1546   BILITOT 0.4 04/30/2020 1546

## 2020-05-29 NOTE — Assessment & Plan Note (Signed)
This has resolved His recent erythrocytosis could be due to dehydration Serum erythropoietin level is pending I will call him with test results I suspect the result would be normal.  If so, he does not need further follow-up, investigation appointment to come back If EPO level is high, it could mean secondary erythrocytosis such as from undiagnosed obstructive sleep apnea However, if EPO level is suppressed, it could mean myeloproliferative disorder although this is unlikely given his normal CBC today

## 2020-05-30 ENCOUNTER — Encounter: Payer: Self-pay | Admitting: *Deleted

## 2020-05-30 ENCOUNTER — Encounter: Payer: Self-pay | Admitting: Hematology and Oncology

## 2020-05-30 LAB — ERYTHROPOIETIN: Erythropoietin: 5.1 m[IU]/mL (ref 2.6–18.5)

## 2020-05-31 ENCOUNTER — Telehealth: Payer: Self-pay | Admitting: Hematology and Oncology

## 2020-05-31 NOTE — Telephone Encounter (Signed)
I reviewed results of serum erythropoietin level It came back normal as expected The most likely cause of his recent erythrocytosis is likely lab error from dehydration The patient inquired the causes of his night sweats/hot flashes I recommend he consult with his primary care doctor for further evaluation and work-up From the hematology standpoint, he does not need future return visit no further investigation

## 2020-06-06 ENCOUNTER — Ambulatory Visit (INDEPENDENT_AMBULATORY_CARE_PROVIDER_SITE_OTHER): Payer: Medicare Other | Admitting: Emergency Medicine

## 2020-06-06 ENCOUNTER — Other Ambulatory Visit: Payer: Self-pay

## 2020-06-06 DIAGNOSIS — Z23 Encounter for immunization: Secondary | ICD-10-CM | POA: Diagnosis not present

## 2020-06-06 NOTE — Patient Instructions (Signed)
° ° ° °  If you have lab work done today you will be contacted with your lab results within the next 2 weeks.  If you have not heard from us then please contact us. The fastest way to get your results is to register for My Chart. ° ° °IF you received an x-ray today, you will receive an invoice from [facility]. Please contact [facility] at [phone] with questions or concerns regarding your invoice.  ° °IF you received labwork today, you will receive an invoice from [facility]. Please contact [facility] at [phone] with questions or concerns regarding your invoice.  ° °Our billing staff will not be able to assist you with questions regarding bills from these companies. ° °You will be contacted with the lab results as soon as they are available. The fastest way to get your results is to activate your My Chart account. Instructions are located on the last page of this paperwork. If you have not heard from us regarding the results in 2 weeks, please contact this office. °  ° ° ° °

## 2020-06-06 NOTE — Progress Notes (Signed)
Nurse visit

## 2020-06-10 ENCOUNTER — Encounter: Payer: Self-pay | Admitting: Emergency Medicine

## 2020-06-12 ENCOUNTER — Encounter: Payer: Self-pay | Admitting: Emergency Medicine

## 2020-06-12 ENCOUNTER — Other Ambulatory Visit: Payer: Self-pay

## 2020-06-12 ENCOUNTER — Ambulatory Visit (INDEPENDENT_AMBULATORY_CARE_PROVIDER_SITE_OTHER): Payer: Medicare Other | Admitting: Emergency Medicine

## 2020-06-12 VITALS — BP 124/78 | HR 68 | Temp 97.6°F | Resp 16 | Ht 66.0 in | Wt 166.0 lb

## 2020-06-12 DIAGNOSIS — I2584 Coronary atherosclerosis due to calcified coronary lesion: Secondary | ICD-10-CM | POA: Diagnosis not present

## 2020-06-12 DIAGNOSIS — E1159 Type 2 diabetes mellitus with other circulatory complications: Secondary | ICD-10-CM

## 2020-06-12 DIAGNOSIS — E785 Hyperlipidemia, unspecified: Secondary | ICD-10-CM | POA: Diagnosis not present

## 2020-06-12 DIAGNOSIS — R202 Paresthesia of skin: Secondary | ICD-10-CM | POA: Insufficient documentation

## 2020-06-12 DIAGNOSIS — Z87898 Personal history of other specified conditions: Secondary | ICD-10-CM | POA: Insufficient documentation

## 2020-06-12 DIAGNOSIS — I152 Hypertension secondary to endocrine disorders: Secondary | ICD-10-CM | POA: Diagnosis not present

## 2020-06-12 DIAGNOSIS — E1169 Type 2 diabetes mellitus with other specified complication: Secondary | ICD-10-CM

## 2020-06-12 DIAGNOSIS — I2510 Atherosclerotic heart disease of native coronary artery without angina pectoris: Secondary | ICD-10-CM

## 2020-06-12 MED ORDER — ROSUVASTATIN CALCIUM 10 MG PO TABS: 10.0000 mg | ORAL_TABLET | Freq: Every day | ORAL | 3 refills | Status: DC

## 2020-06-12 NOTE — Assessment & Plan Note (Signed)
Noted on chest CT scan done June 2019.  Patient is diabetic.  Gets palpitations on exertion but no real chest pain.  Could be atypical presentation of angina, common in diabetics.  Will refer back to cardiology for evaluation of possible coronary artery disease.  In the meantime we will start statin therapy with rosuvastatin 10 mg daily. Follow-up after cardiology evaluation.

## 2020-06-12 NOTE — Assessment & Plan Note (Signed)
Excellent peripheral circulation.  Could be related to early diabetic neuropathy although his distal sensation is very good.  Slowly getting better.  Advised to monitor symptoms at home and report back to me.  Will check vitamin B12 level.

## 2020-06-12 NOTE — Progress Notes (Signed)
Aaron [name] 68 y.o.   Chief Complaint  Patient presents with   Foot Problem    both feet since Friday over the weekend are cold and tingling    HISTORY OF PRESENT ILLNESS: This is a 69 y.o. male complaining of cold feet with tingling that started 5 days ago.  Progressively getting better. No other associated symptoms. Has history of diabetes, hypertension, and dyslipidemia. Lab Results  Component Value Date   HGBA1C 5.5 04/09/2020  The 10-year ASCVD risk score Mikey Bussing DC Brooke Bonito., et al., 2013) is: 25.6%   Values used to calculate the score:     Age: 35 years     Sex: Male     Is Non-Hispanic African American: No     Diabetic: Yes     Tobacco smoker: No     Systolic Blood Pressure: 102 mmHg     Is BP treated: Yes     HDL Cholesterol: 40 mg/dL     Total Cholesterol: 133 mg/dL CT of chest done in June 2019 showed coronary artery calcifications.  Patient has a history of palpitations mostly on exertion.  No dyspnea or chest pain on exertion.  Presently not on statin therapy.  HPI   Prior to Admission medications   Medication Sig Start Date End Date Taking? Authorizing Provider  amLODipine (NORVASC) 5 MG tablet Take 1 tablet (5 mg total) by mouth daily. 06/26/19  Yes Annaleise Burger, Ines Bloomer, MD  fluocinolone (VANOS) 0.01 % cream Apply topically 2 (two) times daily. 02/05/20  Yes Kemyra August, Ines Bloomer, MD  glipiZIDE (GLUCOTROL) 5 MG tablet TAKE 1 TABLET(5 MG) BY MOUTH DAILY BEFORE BREAKFAST 03/05/20  Yes Mercy Malena, Ines Bloomer, MD  melatonin 5 MG TABS Take 5 mg by mouth.   Yes [provider]  metFORMIN (GLUCOPHAGE-XR) 500 MG 24 hr tablet TAKE 1 TABLET BY MOUTH DAILY AT 8 PM 03/18/20  Yes Tattiana Fakhouri, Ines Bloomer, MD  Multiple Vitamin (MULTI-VITAMIN PO) Take by mouth daily.   Yes [provider]    Allergies  Allergen Reactions   Erythromycin Itching   Levaquin [Levofloxacin In D5w]     Light sensitivity     Patient Active Problem List   Diagnosis Date Noted    Erythrocytosis 05/08/2020   Weight loss, non-intentional 05/08/2020   Night sweats 05/08/2020   Hypertension associated with diabetes ([place]) 05/29/2019   Gastroesophageal reflux disease without esophagitis 03/13/2017   Dyslipidemia associated with type 2 diabetes mellitus ([place]) 03/13/2017    Past Medical History:  Diagnosis Date   Diabetes mellitus without complication (HCC)    GERD (gastroesophageal reflux disease)    Hyperlipidemia    Hypertension    Psoriasis     History reviewed. No pertinent surgical history.  Social History   Socioeconomic History   Marital status: Married    Spouse name: Not on file   Number of children: 0   Years of education: Not on file   Highest education level: High school graduate  Occupational History   Occupation: Press photographer, retired   Tobacco Use   Smoking status: Former Smoker    Types: Cigarettes    Quit date: 1985    Years since quitting: 36.8   Smokeless tobacco: Never Used  Scientific laboratory technician Use: Never used  Substance and Sexual Activity   Alcohol use: Yes    Alcohol/week: 7.0 standard drinks    Types: 7 Cans of beer per week    Comment: 1 beer with dinner usually daily   Drug  use: No   Sexual activity: Yes  Other Topics Concern   Not on file  Social History Narrative   Marital status:  Married.             Children:   none      Lives:      Employment:  Press photographer      Tobacco:  none      Alcohol:  About 7 drinks per week      Drugs:   none      Exercise:      Seatbelt:      Education: [employer].      Lives at home with wife   Right handed   Caffeine: 2 cups/day   Social Determinants of Health   Financial Resource Strain:    Difficulty of Paying Living Expenses: Not on file  Food Insecurity:    Worried About [place] in the Last Year: Not on file   Ran Out of Food in the Last Year: Not on file  Transportation Needs:    Lack of Transportation (Medical): Not on file   Lack of  Transportation (Non-Medical): Not on file  Physical Activity:    Days of Exercise per Week: Not on file   Minutes of Exercise per Session: Not on file  Stress:    Feeling of Stress : Not on file  Social Connections:    Frequency of Communication with Friends and Family: Not on file   Frequency of Social Gatherings with Friends and Family: Not on file   Attends Religious Services: Not on file   Active Member of Clubs or Organizations: Not on file   Attends Archivist Meetings: Not on file   Marital Status: Not on file  Intimate Partner Violence:    Fear of Current or Ex-Partner: Not on file   Emotionally Abused: Not on file   Physically Abused: Not on file   Sexually Abused: Not on file    Family History  Problem Relation Age of Onset   Cancer Mother        breast ca spread to bone   Cancer Father 11       brain cancer   Migraines Neg Hx    Headache Neg Hx      Review of Systems  Constitutional: Negative.  Negative for chills and fever.  HENT: Negative.  Negative for congestion and sore throat.   Respiratory: Negative.  Negative for cough and shortness of breath.   Cardiovascular: Positive for palpitations. Negative for chest pain and leg swelling.  Gastrointestinal: Negative.  Negative for abdominal pain, diarrhea, nausea and vomiting.  Genitourinary: Negative.  Negative for dysuria and hematuria.  Musculoskeletal: Negative.  Negative for myalgias and neck pain.  Skin: Negative.  Negative for rash.  Neurological: Negative.  Negative for dizziness and headaches.  All other systems reviewed and are negative.  Today's Vitals   06/12/20 1020  BP: 124/78  Pulse: 68  Resp: 16  Temp: 97.6 F (36.4 C)  TempSrc: Temporal  SpO2: 98%  Weight: 166 lb (75.3 kg)  Height: 5\' 6"  (1.676 m)   Body mass index is 26.79 kg/m.   Physical Exam Vitals reviewed.  Constitutional:      Appearance: Normal appearance.  HENT:     Head: Normocephalic.    Eyes:     Extraocular Movements: Extraocular movements intact.     Pupils: Pupils are equal, round, and reactive to light.  Cardiovascular:  Rate and Rhythm: Normal rate and regular rhythm.     Pulses: Normal pulses.     Heart sounds: Normal heart sounds.  Pulmonary:     Effort: Pulmonary effort is normal.     Breath sounds: Normal breath sounds.  Musculoskeletal:     Cervical back: Normal range of motion and neck supple.     Comments: Feet: Warm to touch.  No erythema or ecchymosis.  No skin breakdowns.  No swelling or tenderness.  Excellent peripheral pulses.  Excellent capillary refill.  Good sensation.  Skin:    General: Skin is warm and dry.     Capillary Refill: Capillary refill takes less than 2 seconds.  Neurological:     General: No focal deficit present.     Mental Status: He is alert and oriented to person, place, and time.  Psychiatric:        Mood and Affect: Mood normal.        Behavior: Behavior normal.    A total of 45 minutes was spent with the patient, greater than 50% of which was in counseling/coordination of care regarding differential diagnosis of tingling on his feet, possibility of coronary artery disease, need for statin therapy, need for cardiology evaluation and diagnostic work-up for CAD, review of most recent office visit notes, review of most recent blood work results, review of chest CT from June 2019, cardiovascular risks associated with diabetes, hypertension and dyslipidemia, prognosis, documentation, and need for follow-up.   ASSESSMENT & PLAN: Coronary artery calcification Noted on chest CT scan done June 2019.  Patient is diabetic.  Gets palpitations on exertion but no real chest pain.  Could be atypical presentation of angina, common in diabetics.  Will refer back to cardiology for evaluation of possible coronary artery disease.  In the meantime we will start statin therapy with rosuvastatin 10 mg daily. Follow-up after cardiology  evaluation.  Tingling of both feet Excellent peripheral circulation.  Could be related to early diabetic neuropathy although his distal sensation is very good.  Slowly getting better.  Advised to monitor symptoms at home and report back to me.  Will check vitamin B12 level.  Macauley was seen today for foot problem.  Diagnoses and all orders for this visit:  Tingling of both feet -     Vitamin B12 -     Comprehensive metabolic panel  Dyslipidemia associated with type 2 diabetes mellitus (HCC) -     rosuvastatin (CRESTOR) 10 MG tablet; Take 1 tablet (10 mg total) by mouth daily.  Hypertension associated with diabetes ([place]) -     HM Diabetes Foot Exam -     Microalbumin, urine -     Hemoglobin A1c  Coronary artery calcification -     Ambulatory referral to Cardiology  History of palpitations    Patient Instructions       If you have lab work done today you will be contacted with your lab results within the next 2 weeks.  If you have not heard from [place] then please contact us. The fastest way to get your results is to register for My Chart.   IF you received an x-ray today, you will receive an invoice from [facility]. Please contact [facility] at [phone] with questions or concerns regarding your invoice.   IF you received labwork today, you will receive an invoice from [place]. Please contact [facility] at [phone] with questions or concerns regarding your invoice.   Our billing staff will not be able to  assist you with questions regarding bills from these companies.  You will be contacted with the lab results as soon as they are available. The fastest way to get your results is to activate your My Chart account. Instructions are located on the last page of this paperwork. If you have not heard from [place] regarding the results in 2 weeks, please contact this office.     Health Maintenance After Age 15 After age 35, you are at a higher risk for  certain long-term diseases and infections as well as injuries from falls. Falls are a major cause of broken bones and head injuries in people who are older than age 64. Getting regular preventive care can help to keep you healthy and well. Preventive care includes getting regular testing and making lifestyle changes as recommended by your health care provider. Talk with your health care provider about:  Which screenings and tests you should have. A screening is a test that checks for a disease when you have no symptoms.  A diet and exercise plan that is right for you. What should I know about screenings and tests to prevent falls? Screening and testing are the best ways to find a health problem early. Early diagnosis and treatment give you the best chance of managing medical conditions that are common after age 20. Certain conditions and lifestyle choices may make you more likely to have a fall. Your health care provider may recommend:  Regular vision checks. Poor vision and conditions such as cataracts can make you more likely to have a fall. If you wear glasses, make sure to get your prescription updated if your vision changes.  Medicine review. Work with your health care provider to regularly review all of the medicines you are taking, including over-the-counter medicines. Ask your health care provider about any side effects that may make you more likely to have a fall. Tell your health care provider if any medicines that you take make you feel dizzy or sleepy.  Osteoporosis screening. Osteoporosis is a condition that causes the bones to get weaker. This can make the bones weak and cause them to break more easily.  Blood pressure screening. Blood pressure changes and medicines to control blood pressure can make you feel dizzy.  Strength and balance checks. Your health care provider may recommend certain tests to check your strength and balance while standing, walking, or changing  positions.  Foot health exam. Foot pain and numbness, as well as not wearing proper footwear, can make you more likely to have a fall.  Depression screening. You may be more likely to have a fall if you have a fear of falling, feel emotionally low, or feel unable to do activities that you used to do.  Alcohol use screening. Using too much alcohol can affect your balance and may make you more likely to have a fall. What actions can I take to lower my risk of falls? General instructions  Talk with your health care provider about your risks for falling. Tell your health care provider if: ? You fall. Be sure to tell your health care provider about all falls, even ones that seem minor. ? You feel dizzy, sleepy, or off-balance.  Take over-the-counter and prescription medicines only as told by your health care provider. These include any supplements.  Eat a healthy diet and maintain a healthy weight. A healthy diet includes low-fat dairy products, low-fat (lean) meats, and fiber from whole grains, beans, and lots of fruits and vegetables. Home safety  Remove any tripping hazards, such as rugs, cords, and clutter.  Install safety equipment such as grab bars in bathrooms and safety rails on stairs.  Keep rooms and walkways well-lit. Activity   Follow a regular exercise program to stay fit. This will help you maintain your balance. Ask your health care provider what types of exercise are appropriate for you.  If you need a cane or walker, use it as recommended by your health care provider.  Wear supportive shoes that have nonskid soles. Lifestyle  Do not drink alcohol if your health care provider tells you not to drink.  If you drink alcohol, limit how much you have: ? 0-1 drink a day for women. ? 0-2 drinks a day for men.  Be aware of how much alcohol is in your drink. In the [place] one drink equals one typical bottle of beer (12 oz), one-half glass of wine (5 oz), or one shot of hard  liquor (1 oz).  Do not use any products that contain nicotine or tobacco, such as cigarettes and e-cigarettes. If you need help quitting, ask your health care provider. Summary  Having a healthy lifestyle and getting preventive care can help to protect your health and wellness after age 9.  Screening and testing are the best way to find a health problem early and help you avoid having a fall. Early diagnosis and treatment give you the best chance for managing medical conditions that are more common for people who are older than age 57.  Falls are a major cause of broken bones and head injuries in people who are older than age 73. Take precautions to prevent a fall at home.  Work with your health care provider to learn what changes you can make to improve your health and wellness and to prevent falls. This information is not intended to replace advice given to you by your health care provider. Make sure you discuss any questions you have with your health care provider. Document Revised: 12/08/2018 Document Reviewed: 06/30/2017 Elsevier Patient Education  2020 Elsevier Inc.      Agustina Caroli, MD [facility]

## 2020-06-12 NOTE — Patient Instructions (Addendum)
   If you have lab work done today you will be contacted with your lab results within the next 2 weeks.  If you have not heard from us then please contact us. The fastest way to get your results is to register for My Chart.   IF you received an x-ray today, you will receive an invoice from [facility]. Please contact [facility] at [phone] with questions or concerns regarding your invoice.   IF you received labwork today, you will receive an invoice from [facility]. Please contact [facility] at [phone] with questions or concerns regarding your invoice.   Our billing staff will not be able to assist you with questions regarding bills from these companies.  You will be contacted with the lab results as soon as they are available. The fastest way to get your results is to activate your My Chart account. Instructions are located on the last page of this paperwork. If you have not heard from us regarding the results in 2 weeks, please contact this office.     Health Maintenance After Age 65 After age 65, you are at a higher risk for certain long-term diseases and infections as well as injuries from falls. Falls are a major cause of broken bones and head injuries in people who are older than age 65. Getting regular preventive care can help to keep you healthy and well. Preventive care includes getting regular testing and making lifestyle changes as recommended by your health care provider. Talk with your health care provider about:  Which screenings and tests you should have. A screening is a test that checks for a disease when you have no symptoms.  A diet and exercise plan that is right for you. What should I know about screenings and tests to prevent falls? Screening and testing are the best ways to find a health problem early. Early diagnosis and treatment give you the best chance of managing medical conditions that are common after age 65. Certain conditions and  lifestyle choices may make you more likely to have a fall. Your health care provider may recommend:  Regular vision checks. Poor vision and conditions such as cataracts can make you more likely to have a fall. If you wear glasses, make sure to get your prescription updated if your vision changes.  Medicine review. Work with your health care provider to regularly review all of the medicines you are taking, including over-the-counter medicines. Ask your health care provider about any side effects that may make you more likely to have a fall. Tell your health care provider if any medicines that you take make you feel dizzy or sleepy.  Osteoporosis screening. Osteoporosis is a condition that causes the bones to get weaker. This can make the bones weak and cause them to break more easily.  Blood pressure screening. Blood pressure changes and medicines to control blood pressure can make you feel dizzy.  Strength and balance checks. Your health care provider may recommend certain tests to check your strength and balance while standing, walking, or changing positions.  Foot health exam. Foot pain and numbness, as well as not wearing proper footwear, can make you more likely to have a fall.  Depression screening. You may be more likely to have a fall if you have a fear of falling, feel emotionally low, or feel unable to do activities that you used to do.  Alcohol use screening. Using too much alcohol can affect your balance and may make you more likely to   have a fall. What actions can I take to lower my risk of falls? General instructions  Talk with your health care provider about your risks for falling. Tell your health care provider if: ? You fall. Be sure to tell your health care provider about all falls, even ones that seem minor. ? You feel dizzy, sleepy, or off-balance.  Take over-the-counter and prescription medicines only as told by your health care provider. These include any  supplements.  Eat a healthy diet and maintain a healthy weight. A healthy diet includes low-fat dairy products, low-fat (lean) meats, and fiber from whole grains, beans, and lots of fruits and vegetables. Home safety  Remove any tripping hazards, such as rugs, cords, and clutter.  Install safety equipment such as grab bars in bathrooms and safety rails on stairs.  Keep rooms and walkways well-lit. Activity   Follow a regular exercise program to stay fit. This will help you maintain your balance. Ask your health care provider what types of exercise are appropriate for you.  If you need a cane or walker, use it as recommended by your health care provider.  Wear supportive shoes that have nonskid soles. Lifestyle  Do not drink alcohol if your health care provider tells you not to drink.  If you drink alcohol, limit how much you have: ? 0-1 drink a day for women. ? 0-2 drinks a day for men.  Be aware of how much alcohol is in your drink. In the [place] one drink equals one typical bottle of beer (12 oz), one-half glass of wine (5 oz), or one shot of hard liquor (1 oz).  Do not use any products that contain nicotine or tobacco, such as cigarettes and e-cigarettes. If you need help quitting, ask your health care provider. Summary  Having a healthy lifestyle and getting preventive care can help to protect your health and wellness after age 65.  Screening and testing are the best way to find a health problem early and help you avoid having a fall. Early diagnosis and treatment give you the best chance for managing medical conditions that are more common for people who are older than age 65.  Falls are a major cause of broken bones and head injuries in people who are older than age 65. Take precautions to prevent a fall at home.  Work with your health care provider to learn what changes you can make to improve your health and wellness and to prevent falls. This information is not intended  to replace advice given to you by your health care provider. Make sure you discuss any questions you have with your health care provider. Document Revised: 12/08/2018 Document Reviewed: 06/30/2017 Elsevier Patient Education  2020 Elsevier Inc.  

## 2020-06-13 LAB — COMPREHENSIVE METABOLIC PANEL
ALT: 12 IU/L (ref 0–44)
AST: 14 IU/L (ref 0–40)
Albumin/Globulin Ratio: 2.2 (ref 1.2–2.2)
Albumin: 4.3 g/dL (ref 3.8–4.8)
Alkaline Phosphatase: 93 IU/L (ref 44–121)
BUN/Creatinine Ratio: 19 (ref 10–24)
BUN: 14 mg/dL (ref 8–27)
Bilirubin Total: 0.4 mg/dL (ref 0.0–1.2)
CO2: 27 mmol/L (ref 20–29)
Calcium: 9.2 mg/dL (ref 8.6–10.2)
Chloride: 103 mmol/L (ref 96–106)
Creatinine, Ser: 0.75 mg/dL — ABNORMAL LOW (ref 0.76–1.27)
GFR calc Af Amer: 110 mL/min/{1.73_m2} (ref >59)
GFR calc non Af Amer: 95 mL/min/{1.73_m2} (ref >59)
Globulin, Total: 2 g/dL (ref 1.5–4.5)
Glucose: 103 mg/dL — ABNORMAL HIGH (ref 65–99)
Potassium: 4.9 mmol/L (ref 3.5–5.2)
Sodium: 142 mmol/L (ref 134–144)
Total Protein: 6.3 g/dL (ref 6.0–8.5)

## 2020-06-13 LAB — MICROALBUMIN, URINE: Microalbumin, Urine: <3 ug/mL

## 2020-06-13 LAB — HEMOGLOBIN A1C
Est. average glucose Bld gHb Est-mCnc: 114 mg/dL
Hgb A1c MFr Bld: 5.6 % (ref 4.8–5.6)

## 2020-06-13 LAB — VITAMIN B12: Vitamin B-12: 331 pg/mL (ref 232–1245)

## 2020-06-15 ENCOUNTER — Encounter: Payer: Self-pay | Admitting: Emergency Medicine

## 2020-06-17 NOTE — Telephone Encounter (Signed)
Pt has questions about form and proper dose. Please advise

## 2020-06-17 NOTE — Telephone Encounter (Signed)
591mcg daily

## 2020-07-04 NOTE — Progress Notes (Signed)
Cardiology Office Note   Date:  07/05/2020   ID:  Aaron [name], DOB 06-09-1951, MRN [id number]  PCP:  [name], MD  Cardiologist:   No primary care provider on file.  Chief Complaint  Patient presents with  . Palpitations  . Cold Feet      History of Present Illness: Aaron [name] is a 69 y.o. male who is referred by [name], MD for evaluation of palpitations.  He says he still occasionally feels these with his heart rate going fast.  He is not having any presyncope or syncope.  He describes the rapid heart rates as he did previously where he might have several minutes of his heart beating fast but we did not pick this up on the monitor.  He has other complaints including cold feet.  He might interpret this as discomfort.  He also brings up the fact that he had a CT done in 2019.  This was of his chest and he was found coronary calcium.  He denies any chest pressure, neck or arm discomfort.  He said no new shortness of breath, PND or orthopnea.  He has had no weight gain or edema.   Past Medical History:  Diagnosis Date  . Diabetes mellitus without complication ([place])   . GERD (gastroesophageal reflux disease)   . Hyperlipidemia   . Hypertension   . Psoriasis     History reviewed. No pertinent surgical history.   Current Outpatient Medications  Medication Sig Dispense Refill  . amLODipine (NORVASC) 2.5 MG tablet Take 1 tablet (2.5 mg total) by mouth daily. 90 tablet 3  . fluocinolone (VANOS) 0.01 % cream Apply topically 2 (two) times daily. 30 g 3  . glipiZIDE (GLUCOTROL) 5 MG tablet TAKE 1 TABLET(5 MG) BY MOUTH DAILY BEFORE BREAKFAST 90 tablet 1  . melatonin 5 MG TABS Take 5 mg by mouth.    . metFORMIN (GLUCOPHAGE-XR) 500 MG 24 hr tablet TAKE 1 TABLET BY MOUTH DAILY AT 8 PM 180 tablet 0  . Multiple Vitamin (MULTI-VITAMIN PO) Take by mouth daily.    . rosuvastatin (CRESTOR) 10 MG tablet Take 1 tablet (10 mg total) by mouth daily. 90 tablet  3   No current facility-administered medications for this visit.    Allergies:   Erythromycin and Levaquin [levofloxacin in d5w]    ROS:  Please see the history of present illness.   Otherwise, review of systems are positive for none.   All other systems are reviewed and negative.    PHYSICAL EXAM: VS:  BP 112/70   Pulse 62   Ht 5\' 6"  (1.676 m)   Wt 165 lb 3.2 oz (74.9 kg)   SpO2 99%   BMI 26.66 kg/m  , BMI Body mass index is 26.66 kg/m. GENERAL:  Well appearing NECK:  No jugular venous distention, waveform within normal limits, carotid upstroke brisk and symmetric, no bruits, no thyromegaly LUNGS:  Clear to auscultation bilaterally CHEST:  Unremarkable HEART:  PMI not displaced or sustained,S1 and S2 within normal limits, no S3, no S4, no clicks, no rubs, no murmurs ABD:  Flat, positive bowel sounds normal in frequency in pitch, no bruits, no rebound, no guarding, no midline pulsatile mass, no hepatomegaly, no splenomegaly EXT:  2 plus pulses upper, mildly diminished dorsalis pedis and posterior tibialis bilateral lower, no edema, no cyanosis no clubbing   EKG:  EKG is  ordered today. The ekg ordered today demonstrates sinus rhythm, rate 62, axis  within normal limits, intervals within normal limits, no acute ST-T wave changes.   Recent Labs: 04/30/2020: TSH 2.750 05/29/2020: Hemoglobin 15.7; Platelets 284 06/12/2020: ALT 12; BUN 14; Creatinine, Ser 0.75; Potassium 4.9; Sodium 142    Lipid Panel    Component Value Date/Time   CHOL 133 11/27/2019 1048   TRIG 151 (H) 11/27/2019 1048   HDL 40 11/27/2019 1048   CHOLHDL 3.3 11/27/2019 1048   CHOLHDL 3.9 04/29/2015 1339   VLDL 27 04/29/2015 1339   LDLCALC 67 11/27/2019 1048      Wt Readings from Last 3 Encounters:  07/05/20 165 lb 3.2 oz (74.9 kg)  06/12/20 166 lb (75.3 kg)  05/29/20 169 lb 9.6 oz (76.9 kg)      Other studies Reviewed: Additional studies/ records that were reviewed today include: CT 2019 event  monitor. Review of the above records demonstrates:  Please see elsewhere in the note.     ASSESSMENT AND PLAN:  PALPITATIONS:   He wore a 3 day monitor without any arrhythmias noted.  At this point we will not change his therapy or follow-up with other studies but he would let me know if he is continuing to have problems with this I could change him to Cardizem instead of Norvasc.    HTN: Blood pressure is running low.  He is lost quite a bit of weight.  I think the reward for this should be coming down on some of his medicines so I will reduce his amlodipine to 2.5 mg daily.  FOOT PAIN: This is really manifesting his cold feet.  He has some reduced pulses.  I will order ABIs.  CORONARY CALCIUM: Given this I will screen with a POET (Plain Old Exercise Treadmill)   Current medicines are reviewed at length with the patient today.  The patient does not have concerns regarding medicines.  The following changes have been made:  no change  Labs/ tests ordered today include:   Orders Placed This Encounter  Procedures  . Exercise Tolerance Test  . EKG 12-Lead  . VAS [place] ABI WITH/WO TBI     Disposition:   FU with me as needed based on the results of the above.   Signed, Minus Breeding, MD  07/05/2020 10:12 AM    [place]

## 2020-07-05 ENCOUNTER — Ambulatory Visit (INDEPENDENT_AMBULATORY_CARE_PROVIDER_SITE_OTHER): Payer: Medicare Other | Admitting: Cardiology

## 2020-07-05 ENCOUNTER — Encounter: Payer: Self-pay | Admitting: Cardiology

## 2020-07-05 ENCOUNTER — Other Ambulatory Visit: Payer: Self-pay

## 2020-07-05 VITALS — BP 112/70 | HR 62 | Ht 66.0 in | Wt 165.2 lb

## 2020-07-05 DIAGNOSIS — I2510 Atherosclerotic heart disease of native coronary artery without angina pectoris: Secondary | ICD-10-CM | POA: Diagnosis not present

## 2020-07-05 DIAGNOSIS — I2584 Coronary atherosclerosis due to calcified coronary lesion: Secondary | ICD-10-CM | POA: Diagnosis not present

## 2020-07-05 DIAGNOSIS — R202 Paresthesia of skin: Secondary | ICD-10-CM

## 2020-07-05 DIAGNOSIS — R002 Palpitations: Secondary | ICD-10-CM | POA: Diagnosis not present

## 2020-07-05 DIAGNOSIS — E1159 Type 2 diabetes mellitus with other circulatory complications: Secondary | ICD-10-CM

## 2020-07-05 DIAGNOSIS — I152 Hypertension secondary to endocrine disorders: Secondary | ICD-10-CM

## 2020-07-05 MED ORDER — AMLODIPINE BESYLATE 2.5 MG PO TABS: 2.5000 mg | ORAL_TABLET | Freq: Every day | ORAL | 3 refills | Status: DC

## 2020-07-05 NOTE — Patient Instructions (Signed)
Medication Instructions:  DECREASE your amlodipine to 2.5 mg per day. A new prescription has been sent to your pharmacy for that strength.  *If you need a refill on your cardiac medications before your next appointment, please call your pharmacy*   Lab Work: None If you have labs (blood work) drawn today and your tests are completely normal, you will receive your results only by:  [place] (if you have MyChart) OR  A paper copy in the mail If you have any lab test that is abnormal or we need to change your treatment, we will call you to review the results.   Testing/Procedures: Your physician has requested that you have an exercise tolerance test. For further information please visit [email or web address]. Please also follow instruction sheet, as given. This will take place at [place], [address].  Do not drink or eat foods with caffeine for 24 hours before the test. (Chocolate, coffee, tea, or energy drinks)  If you use an inhaler, bring it with you to the test.  Do not smoke for 4 hours before the test.  Wear comfortable shoes and clothing.  You will need to have the coronavirus test completed prior to your procedure. This is a Drive Up Visit at the [address] [facility]. Please tell them that you are there for pre-procedure testing. Someone will direct you to the appropriate testing line. Stay in your car and someone will be with you shortly. Please make sure to have all other labs completed before this test because you will need to stay quarantined until your procedure. Please take your insurance card to this test.   Your physician has requested that you have an ankle brachial index (ABI). During this test an ultrasound and blood pressure cuff are used to evaluate the arteries that supply the arms and legs with blood. Allow thirty minutes for this exam. There are no restrictions or special instructions.     Follow-Up: At [facility], you and your  health needs are our priority.  As part of our continuing mission to provide you with exceptional heart care, we have created designated Provider Care Teams.  These Care Teams include your primary Cardiologist (physician) and Advanced Practice Providers (APPs -  Physician Assistants and Nurse Practitioners) who all work together to provide you with the care you need, when you need it.  We recommend signing up for the patient portal called "MyChart".  Sign up information is provided on this After Visit Summary.  MyChart is used to connect with patients for Virtual Visits (Telemedicine).  Patients are able to view lab/test results, encounter notes, upcoming appointments, etc.  Non-urgent messages can be sent to your provider as well.   To learn more about what you can do with MyChart, go to [email or web address].    Your next appointment:   Follow up with Dr. Minus Breeding, MD on an as-needed basis   Other Instructions None

## 2020-07-08 ENCOUNTER — Other Ambulatory Visit (HOSPITAL_COMMUNITY): Payer: Medicare Other

## 2020-07-09 ENCOUNTER — Other Ambulatory Visit (HOSPITAL_COMMUNITY)
Admission: RE | Admit: 2020-07-09 | Discharge: 2020-07-09 | Disposition: A | Discharge status: Home / Self Care | Payer: Medicare Other | Source: Ambulatory Visit | Attending: Cardiology | Admitting: Cardiology

## 2020-07-09 DIAGNOSIS — Z20822 Contact with and (suspected) exposure to covid-19: Secondary | ICD-10-CM | POA: Diagnosis not present

## 2020-07-09 DIAGNOSIS — Z01812 Encounter for preprocedural laboratory examination: Secondary | ICD-10-CM | POA: Diagnosis not present

## 2020-07-09 LAB — SARS CORONAVIRUS 2 (TAT 6-24 HRS): SARS Coronavirus 2: NEGATIVE

## 2020-07-10 ENCOUNTER — Telehealth (HOSPITAL_COMMUNITY): Payer: Self-pay | Admitting: *Deleted

## 2020-07-10 NOTE — Telephone Encounter (Signed)
Close encounter 

## 2020-07-11 ENCOUNTER — Ambulatory Visit (HOSPITAL_COMMUNITY)
Admission: RE | Admit: 2020-07-11 | Discharge: 2020-07-11 | Disposition: A | Discharge status: Home / Self Care | Payer: Medicare Other | Source: Ambulatory Visit | Attending: Cardiology | Admitting: Cardiology

## 2020-07-11 DIAGNOSIS — I2584 Coronary atherosclerosis due to calcified coronary lesion: Secondary | ICD-10-CM | POA: Diagnosis not present

## 2020-07-11 DIAGNOSIS — R002 Palpitations: Secondary | ICD-10-CM

## 2020-07-11 DIAGNOSIS — I2510 Atherosclerotic heart disease of native coronary artery without angina pectoris: Secondary | ICD-10-CM

## 2020-07-11 LAB — EXERCISE TOLERANCE TEST
Estimated workload: 8 METS
Exercise duration (min): 6 min
Exercise duration (sec): 41 s
MPHR: 153 {beats}/min
Peak HR: 160 {beats}/min
Percent HR: 104 %
Rest HR: 70 {beats}/min

## 2020-07-15 ENCOUNTER — Other Ambulatory Visit: Payer: Self-pay | Admitting: Cardiology

## 2020-07-15 DIAGNOSIS — R0989 Other specified symptoms and signs involving the circulatory and respiratory systems: Secondary | ICD-10-CM

## 2020-07-15 DIAGNOSIS — R202 Paresthesia of skin: Secondary | ICD-10-CM

## 2020-07-17 ENCOUNTER — Telehealth: Payer: Self-pay | Admitting: *Deleted

## 2020-07-17 NOTE — Telephone Encounter (Signed)
Reviewed ETT results with patient   From  [name] To  Minus Breeding, MD Sent  07/15/2020 10:35 AM  Dr. Percival Spanish, Please explain what the results mean. I read the results but I don't really understand what it all means. Thank you, Beckie Busing

## 2020-07-22 ENCOUNTER — Encounter: Payer: Self-pay | Admitting: Emergency Medicine

## 2020-07-23 ENCOUNTER — Encounter: Payer: Self-pay | Admitting: Emergency Medicine

## 2020-07-23 ENCOUNTER — Other Ambulatory Visit: Payer: Self-pay

## 2020-07-23 ENCOUNTER — Ambulatory Visit (INDEPENDENT_AMBULATORY_CARE_PROVIDER_SITE_OTHER): Payer: Medicare Other | Admitting: Emergency Medicine

## 2020-07-23 VITALS — BP 147/78 | HR 59 | Temp 97.5°F | Resp 16 | Ht 66.0 in | Wt 163.0 lb

## 2020-07-23 DIAGNOSIS — I2584 Coronary atherosclerosis due to calcified coronary lesion: Secondary | ICD-10-CM

## 2020-07-23 DIAGNOSIS — I152 Hypertension secondary to endocrine disorders: Secondary | ICD-10-CM | POA: Diagnosis not present

## 2020-07-23 DIAGNOSIS — E1169 Type 2 diabetes mellitus with other specified complication: Secondary | ICD-10-CM

## 2020-07-23 DIAGNOSIS — E785 Hyperlipidemia, unspecified: Secondary | ICD-10-CM | POA: Diagnosis not present

## 2020-07-23 DIAGNOSIS — R21 Rash and other nonspecific skin eruption: Secondary | ICD-10-CM

## 2020-07-23 DIAGNOSIS — I2510 Atherosclerotic heart disease of native coronary artery without angina pectoris: Secondary | ICD-10-CM | POA: Diagnosis not present

## 2020-07-23 DIAGNOSIS — N481 Balanitis: Secondary | ICD-10-CM | POA: Diagnosis not present

## 2020-07-23 DIAGNOSIS — E1159 Type 2 diabetes mellitus with other circulatory complications: Secondary | ICD-10-CM

## 2020-07-23 LAB — POCT URINALYSIS DIP (MANUAL ENTRY)
Bilirubin, UA: NEGATIVE
Blood, UA: NEGATIVE
Glucose, UA: NEGATIVE mg/dL
Leukocytes, UA: NEGATIVE
Nitrite, UA: NEGATIVE
Protein Ur, POC: NEGATIVE mg/dL
Spec Grav, UA: 1.02 (ref 1.010–1.025)
Urobilinogen, UA: 1 E.U./dL
pH, UA: 7.5 (ref 5.0–8.0)

## 2020-07-23 MED ORDER — FLUCONAZOLE 150 MG PO TABS: 150.0000 mg | ORAL_TABLET | Freq: Once | ORAL | 0 refills | Status: AC

## 2020-07-23 NOTE — Patient Instructions (Addendum)
Start using Lotrisone cream twice a day to affected area for 7 days.  Balanitis  Balanitis is swelling and irritation (inflammation) of the head of the penis (glans penis). The condition may also cause inflammation of the skin around the glans penis (foreskin) in men who have not been circumcised. It may develop because of an infection or another medical condition. Balanitis occurs most often among men who have not had their foreskin removed (uncircumcised men). Balanitis sometimes causes scarring of the penis or foreskin, which can require surgery. Untreated balanitis can increase the risk of penile cancer. What are the causes? Common causes of this condition include:  Poor personal hygiene, especially in uncircumcised men. Not cleaning the glans penis and foreskin well can result in buildup of bacteria, viruses, and yeast, which can lead to infection and inflammation.  Irritation and lack of air flow due to fluid (smegma) that can build up on the glans penis. Other causes include:  Chemical irritation from products such as soaps or shower gels (especially those that have fragrance), condoms, personal lubricants, petroleum jelly, spermicides, or fabric softeners.  Skin conditions, such as eczema, dermatitis, and psoriasis.  Allergies to medicines, such as tetracycline and sulfa drugs.  Certain medical conditions, including liver cirrhosis, congestive heart failure, diabetes, and kidney disease.  Infections, such as candidiasis, HPV (human papillomavirus), herpes simplex, gonorrhea, and syphilis.  Severe obesity. What increases the risk? The following factors may make you more likely to develop this condition:  Having diabetes. This is the most common risk factor.  Having a tight foreskin that is difficult to pull back (retract) past the glans.  Having sexual intercourse without using a condom. What are the signs or symptoms? Symptoms of this condition include:  Discharge from  under the foreskin.  A bad smell.  Pain or difficulty retracting the foreskin.  Tenderness, redness, and swelling of the glans.  A rash or sores on the glans or foreskin.  Itchiness.  Inability to get an erection due to pain.  Difficulty urinating.  Scarring of the penis or foreskin, in some cases. How is this diagnosed? This condition may be diagnosed based on:  A physical exam.  Testing a swab of discharge to check for bacterial or fungal infection.  Blood tests: ? To check for viruses that can cause balanitis. ? To check your blood sugar (glucose) level. High blood glucose could be a sign of diabetes, which can cause balanitis. How is this treated? Treatment for balanitis depends on the cause. Treatment may include:  Improving personal hygiene. Your health care provider may recommend sitting in a bath of warm water that is deep enough to cover your hips and buttocks (sitz bath).  Medicines such as: ? Creams or ointments to reduce swelling (steroids) or to treat an infection. ? Antibiotic medicine. ? Antifungal medicine.  Surgery to remove or cut the foreskin (circumcision). This may be done if you have scarring on the foreskin that makes it difficult to retract.  Controlling other medical problems that may be causing your condition or making it worse. Follow these instructions at home:  Do not have sex until the condition clears up, or until your health care provider approves.  Keep your penis clean and dry. Take sitz baths as recommended by your health care provider.  Avoid products that irritate your skin or make symptoms worse, such as soaps and shower gels that have fragrance.  Take over-the-counter and prescription medicines only as told by your health care provider. ? If  you were prescribed an antibiotic medicine or a cream or ointment, use it as told by your health care provider. Do not stop using your medicine, cream, or ointment even if you start to feel  better. ? Do not drive or use heavy machinery while taking prescription pain medicine. Contact a health care provider if:  Your symptoms get worse or do not improve with home care.  You develop chills or a fever.  You have trouble urinating.  You cannot retract your foreskin. Get help right away if:  You develop severe pain.  You are unable to urinate. Summary  Balanitis is inflammation of the head of the penis (glans penis) caused by irritation or infection.  Balanitis causes pain, redness, and swelling of the glans penis.  This condition is most common among uncircumcised men who do not keep their glans penis clean and in men who have diabetes.  Treatment may include creams or ointments.  Good hygiene is important for prevention. This includes pulling back the foreskin when washing your penis. This information is not intended to replace advice given to you by your health care provider. Make sure you discuss any questions you have with your health care provider. Document Revised: 07/30/2017 Document Reviewed: 07/06/2016 Elsevier Patient Education  [employer].     If you have lab work done today you will be contacted with your lab results within the next 2 weeks.  If you have not heard from [place] then please contact us. The fastest way to get your results is to register for My Chart.   IF you received an x-ray today, you will receive an invoice from [facility]. Please contact [facility] at [phone] with questions or concerns regarding your invoice.   IF you received labwork today, you will receive an invoice from [facility]. Please contact [facility] at [phone] with questions or concerns regarding your invoice.   Our billing staff will not be able to assist you with questions regarding bills from these companies.  You will be contacted with the lab results as soon as they are available. The fastest way to get your results is to activate your My  Chart account. Instructions are located on the last page of this paperwork. If you have not heard from [place] regarding the results in 2 weeks, please contact this office.

## 2020-07-23 NOTE — Progress Notes (Signed)
Aaron [name] 69 y.o.   Chief Complaint  Patient presents with  . Rash    per patient on the penis and he noticed it on Friday  . Diabetes    HISTORY OF PRESENT ILLNESS: This is a 69 y.o. male complaining of rash to glans penis that started 2 to 3 days ago. Well-controlled diabetic. No other associated symptoms. No other complaints or medical concerns today. Lab Results  Component Value Date   HGBA1C 5.6 06/12/2020    HPI   Prior to Admission medications   Medication Sig Start Date End Date Taking? Authorizing Provider  amLODipine (NORVASC) 2.5 MG tablet Take 1 tablet (2.5 mg total) by mouth daily. 07/05/20   Minus Breeding, MD  fluocinolone (VANOS) 0.01 % cream Apply topically 2 (two) times daily. 02/05/20   [name], MD  glipiZIDE (GLUCOTROL) 5 MG tablet TAKE 1 TABLET(5 MG) BY MOUTH DAILY BEFORE BREAKFAST 03/05/20   Lucina Betty, Ines Bloomer, MD  melatonin 5 MG TABS Take 5 mg by mouth.    [provider]  metFORMIN (GLUCOPHAGE-XR) 500 MG 24 hr tablet TAKE 1 TABLET BY MOUTH DAILY AT 8 PM 03/18/20   Shamina Etheridge, Ines Bloomer, MD  Multiple Vitamin (MULTI-VITAMIN PO) Take by mouth daily.    [provider]  rosuvastatin (CRESTOR) 10 MG tablet Take 1 tablet (10 mg total) by mouth daily. 06/12/20   [name], MD    Allergies  Allergen Reactions  . Erythromycin Itching  . Levaquin [Levofloxacin In D5w]     Light sensitivity     Patient Active Problem List   Diagnosis Date Noted  . Coronary artery calcification 06/12/2020  . History of palpitations 06/12/2020  . Tingling of both feet 06/12/2020  . Erythrocytosis 05/08/2020  . Weight loss, non-intentional 05/08/2020  . Night sweats 05/08/2020  . Hypertension associated with diabetes ([place]) 05/29/2019  . Gastroesophageal reflux disease without esophagitis 03/13/2017  . Dyslipidemia associated with type 2 diabetes mellitus ([place]) 03/13/2017    Past Medical History:  Diagnosis Date  .  Diabetes mellitus without complication ([place])   . GERD (gastroesophageal reflux disease)   . Hyperlipidemia   . Hypertension   . Psoriasis     No past surgical history on file.  Social History   Socioeconomic History  . Marital status: Married    Spouse name: Not on file  . Number of children: 0  . Years of education: Not on file  . Highest education level: High school graduate  Occupational History  . Occupation: Press photographer, retired   Tobacco Use  . Smoking status: Former Smoker    Types: Cigarettes    Quit date: 1985    Years since quitting: 36.9  . Smokeless tobacco: Never Used  Vaping Use  . Vaping Use: Never used  Substance and Sexual Activity  . Alcohol use: Yes    Alcohol/week: 7.0 standard drinks    Types: 7 Cans of beer per week    Comment: 1 beer with dinner usually daily  . Drug use: No  . Sexual activity: Yes  Other Topics Concern  . Not on file  Social History Narrative   Marital status:  Married.             Children:   none      Lives:      Employment:  sales      Tobacco:  none      Alcohol:  About 7 drinks per week  Drugs:   none      Exercise:      Seatbelt:      Education: [employer].      Lives at home with wife   Right handed   Caffeine: 2 cups/day   Social Determinants of Health   Financial Resource Strain:   . Difficulty of Paying Living Expenses: Not on file  Food Insecurity:   . Worried About Charity fundraiser in the Last Year: Not on file  . Ran Out of Food in the Last Year: Not on file  Transportation Needs:   . Lack of Transportation (Medical): Not on file  . Lack of Transportation (Non-Medical): Not on file  Physical Activity:   . Days of Exercise per Week: Not on file  . Minutes of Exercise per Session: Not on file  Stress:   . Feeling of Stress : Not on file  Social Connections:   . Frequency of Communication with Friends and Family: Not on file  . Frequency of Social Gatherings with Friends and Family: Not on file   . Attends Religious Services: Not on file  . Active Member of Clubs or Organizations: Not on file  . Attends Archivist Meetings: Not on file  . Marital Status: Not on file  Intimate Partner Violence:   . Fear of Current or Ex-Partner: Not on file  . Emotionally Abused: Not on file  . Physically Abused: Not on file  . Sexually Abused: Not on file    Family History  Problem Relation Age of Onset  . Cancer Mother        breast ca spread to bone  . Cancer Father 76       brain cancer  . Migraines Neg Hx   . Headache Neg Hx      Review of Systems  Constitutional: Negative.  Negative for chills and fever.  HENT: Negative.  Negative for congestion.   Respiratory: Negative.  Negative for cough and shortness of breath.   Cardiovascular: Negative.  Negative for chest pain and palpitations.  Gastrointestinal: Negative.  Negative for abdominal pain, diarrhea, nausea and vomiting.  Genitourinary: Negative.  Negative for dysuria and hematuria.  Skin: Positive for rash.  Neurological: Negative.  Negative for dizziness and headaches.  All other systems reviewed and are negative.  Today's Vitals   07/23/20 1024  BP: (!) 147/78  Pulse: (!) 59  Resp: 16  Temp: (!) 97.5 F (36.4 C)  TempSrc: Temporal  SpO2: 99%  Weight: 163 lb (73.9 kg)  Height: 5\' 6"  (1.676 m)   Body mass index is 26.31 kg/m.   Physical Exam Vitals reviewed.  Constitutional:      Appearance: Normal appearance.  HENT:     Head: Normocephalic.  Eyes:     Extraocular Movements: Extraocular movements intact.     Pupils: Pupils are equal, round, and reactive to light.  Cardiovascular:     Rate and Rhythm: Normal rate.  Pulmonary:     Effort: Pulmonary effort is normal.  Genitourinary:    Penis: Uncircumcised. Erythema (Erythematous rash to glans penis) present.      Testes: Normal.  Musculoskeletal:        General: Normal range of motion.  Lymphadenopathy:     Lower Body: No right inguinal  adenopathy. No left inguinal adenopathy.  Skin:    General: Skin is warm and dry.  Neurological:     General: No focal deficit present.     Mental Status: He  is alert and oriented to person, place, and time.  Psychiatric:        Mood and Affect: Mood normal.        Behavior: Behavior normal.      ASSESSMENT & PLAN: Kohner was seen today for rash and diabetes.  Diagnoses and all orders for this visit:  Balanitis -     fluconazole (DIFLUCAN) 150 MG tablet; Take 1 tablet (150 mg total) by mouth once for 1 dose.  Hypertension associated with diabetes ([place])  Dyslipidemia associated with type 2 diabetes mellitus ([place]) -     POCT urinalysis dipstick  Coronary artery calcification  Rash of genital area -     POCT urinalysis dipstick    Patient Instructions   Start using Lotrisone cream twice a day to affected area for 7 days.  Balanitis  Balanitis is swelling and irritation (inflammation) of the head of the penis (glans penis). The condition may also cause inflammation of the skin around the glans penis (foreskin) in men who have not been circumcised. It may develop because of an infection or another medical condition. Balanitis occurs most often among men who have not had their foreskin removed (uncircumcised men). Balanitis sometimes causes scarring of the penis or foreskin, which can require surgery. Untreated balanitis can increase the risk of penile cancer. What are the causes? Common causes of this condition include:  Poor personal hygiene, especially in uncircumcised men. Not cleaning the glans penis and foreskin well can result in buildup of bacteria, viruses, and yeast, which can lead to infection and inflammation.  Irritation and lack of air flow due to fluid (smegma) that can build up on the glans penis. Other causes include:  Chemical irritation from products such as soaps or shower gels (especially those that have fragrance), condoms, personal lubricants,  petroleum jelly, spermicides, or fabric softeners.  Skin conditions, such as eczema, dermatitis, and psoriasis.  Allergies to medicines, such as tetracycline and sulfa drugs.  Certain medical conditions, including liver cirrhosis, congestive heart failure, diabetes, and kidney disease.  Infections, such as candidiasis, HPV (human papillomavirus), herpes simplex, gonorrhea, and syphilis.  Severe obesity. What increases the risk? The following factors may make you more likely to develop this condition:  Having diabetes. This is the most common risk factor.  Having a tight foreskin that is difficult to pull back (retract) past the glans.  Having sexual intercourse without using a condom. What are the signs or symptoms? Symptoms of this condition include:  Discharge from under the foreskin.  A bad smell.  Pain or difficulty retracting the foreskin.  Tenderness, redness, and swelling of the glans.  A rash or sores on the glans or foreskin.  Itchiness.  Inability to get an erection due to pain.  Difficulty urinating.  Scarring of the penis or foreskin, in some cases. How is this diagnosed? This condition may be diagnosed based on:  A physical exam.  Testing a swab of discharge to check for bacterial or fungal infection.  Blood tests: ? To check for viruses that can cause balanitis. ? To check your blood sugar (glucose) level. High blood glucose could be a sign of diabetes, which can cause balanitis. How is this treated? Treatment for balanitis depends on the cause. Treatment may include:  Improving personal hygiene. Your health care provider may recommend sitting in a bath of warm water that is deep enough to cover your hips and buttocks (sitz bath).  Medicines such as: ? Creams or ointments to reduce swelling (  steroids) or to treat an infection. ? Antibiotic medicine. ? Antifungal medicine.  Surgery to remove or cut the foreskin (circumcision). This may be done  if you have scarring on the foreskin that makes it difficult to retract.  Controlling other medical problems that may be causing your condition or making it worse. Follow these instructions at home:  Do not have sex until the condition clears up, or until your health care provider approves.  Keep your penis clean and dry. Take sitz baths as recommended by your health care provider.  Avoid products that irritate your skin or make symptoms worse, such as soaps and shower gels that have fragrance.  Take over-the-counter and prescription medicines only as told by your health care provider. ? If you were prescribed an antibiotic medicine or a cream or ointment, use it as told by your health care provider. Do not stop using your medicine, cream, or ointment even if you start to feel better. ? Do not drive or use heavy machinery while taking prescription pain medicine. Contact a health care provider if:  Your symptoms get worse or do not improve with home care.  You develop chills or a fever.  You have trouble urinating.  You cannot retract your foreskin. Get help right away if:  You develop severe pain.  You are unable to urinate. Summary  Balanitis is inflammation of the head of the penis (glans penis) caused by irritation or infection.  Balanitis causes pain, redness, and swelling of the glans penis.  This condition is most common among uncircumcised men who do not keep their glans penis clean and in men who have diabetes.  Treatment may include creams or ointments.  Good hygiene is important for prevention. This includes pulling back the foreskin when washing your penis. This information is not intended to replace advice given to you by your health care provider. Make sure you discuss any questions you have with your health care provider. Document Revised: 07/30/2017 Document Reviewed: 07/06/2016 Elsevier Patient Education  [employer].     If you have lab work done  today you will be contacted with your lab results within the next 2 weeks.  If you have not heard from [place] then please contact us. The fastest way to get your results is to register for My Chart.   IF you received an x-ray today, you will receive an invoice from [facility]. Please contact [facility] at [phone] with questions or concerns regarding your invoice.   IF you received labwork today, you will receive an invoice from [facility]. Please contact [facility] at [phone] with questions or concerns regarding your invoice.   Our billing staff will not be able to assist you with questions regarding bills from these companies.  You will be contacted with the lab results as soon as they are available. The fastest way to get your results is to activate your My Chart account. Instructions are located on the last page of this paperwork. If you have not heard from [place] regarding the results in 2 weeks, please contact this office.         Agustina Caroli, MD [facility]

## 2020-07-29 ENCOUNTER — Ambulatory Visit: Payer: Medicare Other | Admitting: Emergency Medicine

## 2020-07-31 ENCOUNTER — Ambulatory Visit (HOSPITAL_COMMUNITY)
Admission: RE | Admit: 2020-07-31 | Discharge: 2020-07-31 | Disposition: A | Discharge status: Home / Self Care | Payer: Medicare Other | Source: Ambulatory Visit | Attending: Cardiology | Admitting: Cardiology

## 2020-07-31 ENCOUNTER — Other Ambulatory Visit: Payer: Self-pay

## 2020-07-31 DIAGNOSIS — R0989 Other specified symptoms and signs involving the circulatory and respiratory systems: Secondary | ICD-10-CM | POA: Insufficient documentation

## 2020-07-31 DIAGNOSIS — R202 Paresthesia of skin: Secondary | ICD-10-CM | POA: Diagnosis not present

## 2020-08-20 ENCOUNTER — Encounter: Payer: Self-pay | Admitting: Emergency Medicine

## 2020-08-21 ENCOUNTER — Encounter: Payer: Self-pay | Admitting: Emergency Medicine

## 2020-08-21 ENCOUNTER — Other Ambulatory Visit: Payer: Self-pay

## 2020-08-21 ENCOUNTER — Telehealth (INDEPENDENT_AMBULATORY_CARE_PROVIDER_SITE_OTHER): Payer: Medicare Other | Admitting: Emergency Medicine

## 2020-08-21 VITALS — Ht 66.0 in | Wt 156.4 lb

## 2020-08-21 DIAGNOSIS — E785 Hyperlipidemia, unspecified: Secondary | ICD-10-CM

## 2020-08-21 DIAGNOSIS — I2510 Atherosclerotic heart disease of native coronary artery without angina pectoris: Secondary | ICD-10-CM

## 2020-08-21 DIAGNOSIS — R202 Paresthesia of skin: Secondary | ICD-10-CM

## 2020-08-21 DIAGNOSIS — I152 Hypertension secondary to endocrine disorders: Secondary | ICD-10-CM

## 2020-08-21 DIAGNOSIS — G8929 Other chronic pain: Secondary | ICD-10-CM | POA: Diagnosis not present

## 2020-08-21 DIAGNOSIS — E1169 Type 2 diabetes mellitus with other specified complication: Secondary | ICD-10-CM | POA: Diagnosis not present

## 2020-08-21 DIAGNOSIS — R519 Headache, unspecified: Secondary | ICD-10-CM

## 2020-08-21 DIAGNOSIS — I2584 Coronary atherosclerosis due to calcified coronary lesion: Secondary | ICD-10-CM

## 2020-08-21 DIAGNOSIS — E1159 Type 2 diabetes mellitus with other circulatory complications: Secondary | ICD-10-CM

## 2020-08-21 NOTE — Telephone Encounter (Signed)
Patient returned Cynthia's call for triage before this afternoon's appointment. Please advise at  [phone]

## 2020-08-21 NOTE — Progress Notes (Signed)
Telemedicine Encounter- SOAP NOTE Established Patient MyChart video conference Patient: Home  Provider: Office     This video telephone encounter was conducted with the patient's (or proxy's) verbal consent via video audio telecommunications: yes/no: Yes Patient was instructed to have this encounter in a suitably private space; and to only have persons present to whom they give permission to participate. In addition, patient identity was confirmed by use of name plus two identifiers (DOB and address).  I discussed the limitations, risks, security and privacy concerns of performing an evaluation and management service by telephone and the availability of in person appointments. I also discussed with the patient that there may be a patient responsible charge related to this service. The patient expressed understanding and agreed to proceed.  I spent a total of TIME; 0 MIN TO 60 MIN: 20 minutes talking with the patient or their proxy.  Chief Complaint  Patient presents with  . Headache    Per patient ongoing for long time and saw Dr Milta Deiters everything is normal  . Fatigue    Per patient he feels tired with lack of energy, taking Vitamin B 12 500 mcg Dr Mitchel Honour told to take  . Tingling    Per patient referral to Dr Lovenia Shuck and everything is normal, but patient cannot understand cold hands and feet. Dr Lovenia Shuck wanted him to follow up with Dr Mitchel Honour   Lab Results  Component Value Date   HGBA1C 5.6 06/12/2020    Subjective   Aaron [name] is a 69 y.o. male established patient. Telephone visit today complaining of persistent coldness of both feet and hands for the past 2 to 3 months. History of well-controlled diabetes on glipizide and Metformin.  Last hemoglobin A1c 5.6. History of hypertension on amlodipine 2.5 mg daily.  Recent cardiology evaluation within normal limits.  Normal exercise tolerance test. Normal evaluation for peripheral vascular disease. Has history of chronic  headaches.  Saw neurologist earlier this year.  Benign examination. Unremarkable most recent blood work results. No new symptoms since last evaluation last October.  Just persistent ones. Cardiologist recommended rheumatology evaluation suspecting possible Raynaud's phenomena. Eating better.  Better nutrition. Wt Readings from Last 3 Encounters:  08/21/20 156 lb 6.4 oz (70.9 kg)  07/23/20 163 lb (73.9 kg)  07/05/20 165 lb 3.2 oz (74.9 kg)  No history to suggest ciguatera disease.   HPI   Patient Active Problem List   Diagnosis Date Noted  . Coronary artery calcification 06/12/2020  . History of palpitations 06/12/2020  . Tingling of both feet 06/12/2020  . Erythrocytosis 05/08/2020  . Weight loss, non-intentional 05/08/2020  . Night sweats 05/08/2020  . Hypertension associated with diabetes ([place]) 05/29/2019  . Gastroesophageal reflux disease without esophagitis 03/13/2017  . Dyslipidemia associated with type 2 diabetes mellitus ([place]) 03/13/2017    Past Medical History:  Diagnosis Date  . Diabetes mellitus without complication ([place])   . GERD (gastroesophageal reflux disease)   . Hyperlipidemia   . Hypertension   . Psoriasis     Current Outpatient Medications  Medication Sig Dispense Refill  . amLODipine (NORVASC) 2.5 MG tablet Take 1 tablet (2.5 mg total) by mouth daily. 90 tablet 3  . fluocinolone (VANOS) 0.01 % cream Apply topically 2 (two) times daily. 30 g 3  . glipiZIDE (GLUCOTROL) 5 MG tablet TAKE 1 TABLET(5 MG) BY MOUTH DAILY BEFORE BREAKFAST 90 tablet 1  . melatonin 5 MG TABS Take 5 mg by mouth.    . metFORMIN (GLUCOPHAGE-XR)  500 MG 24 hr tablet TAKE 1 TABLET BY MOUTH DAILY AT 8 PM 180 tablet 0  . Multiple Vitamin (MULTI-VITAMIN PO) Take by mouth daily.    . rosuvastatin (CRESTOR) 10 MG tablet Take 1 tablet (10 mg total) by mouth daily. 90 tablet 3  . vitamin B-12 (CYANOCOBALAMIN) 500 MCG tablet Take 500 mcg by mouth daily.     No current facility-administered  medications for this visit.    Allergies  Allergen Reactions  . Erythromycin Itching  . Levaquin [Levofloxacin In D5w]     Light sensitivity     Social History   Socioeconomic History  . Marital status: Married    Spouse name: Not on file  . Number of children: 0  . Years of education: Not on file  . Highest education level: High school graduate  Occupational History  . Occupation: Press photographer, retired   Tobacco Use  . Smoking status: Former Smoker    Types: Cigarettes    Quit date: 1985    Years since quitting: 36.9  . Smokeless tobacco: Never Used  Vaping Use  . Vaping Use: Never used  Substance and Sexual Activity  . Alcohol use: Yes    Alcohol/week: 7.0 standard drinks    Types: 7 Cans of beer per week    Comment: 1 beer with dinner usually daily  . Drug use: No  . Sexual activity: Yes  Other Topics Concern  . Not on file  Social History Narrative   Marital status:  Married.             Children:   none      Lives:      Employment:  Press photographer      Tobacco:  none      Alcohol:  About 7 drinks per week      Drugs:   none      Exercise:      Seatbelt:      Education: [employer].      Lives at home with wife   Right handed   Caffeine: 2 cups/day   Social Determinants of Health   Financial Resource Strain: Not on file  Food Insecurity: Not on file  Transportation Needs: Not on file  Physical Activity: Not on file  Stress: Not on file  Social Connections: Not on file  Intimate Partner Violence: Not on file    Review of Systems  Constitutional: Negative.  Negative for chills and fever.  HENT: Negative.  Negative for congestion and sore throat.   Respiratory: Negative.  Negative for cough and shortness of breath.   Cardiovascular: Negative.  Negative for chest pain and palpitations.  Gastrointestinal: Negative.  Negative for abdominal pain, blood in stool, diarrhea, nausea and vomiting.  Skin: Negative.  Negative for rash.  Neurological: Positive for  tingling, sensory change and headaches (Chronic headaches). Negative for speech change, focal weakness and weakness.  All other systems reviewed and are negative.   Objective  Alert and oriented x3 in no apparent respiratory distress. Vitals as reported by the patient: Today's Vitals   08/21/20 1616  Weight: 156 lb 6.4 oz (70.9 kg)  Height: 5\' 6"  (1.676 m)    There are no diagnoses linked to this encounter. Jester was seen today for headache, fatigue and tingling.  Diagnoses and all orders for this visit:  Paresthesia Comments: Suspected Raynaud's  Orders: -     Ambulatory referral to Rheumatology  Hypertension associated with diabetes ([place])  Dyslipidemia associated with type 2  diabetes mellitus ([facility])  Tingling of both feet  Chronic nonintractable headache, unspecified headache type  Clinically stable.  Recommend rheumatology evaluation. Continue monitoring symptoms.  Advised to contact the office if no better or worse during the next several weeks.   I discussed the assessment and treatment plan with the patient. The patient was provided an opportunity to ask questions and all were answered. The patient agreed with the plan and demonstrated an understanding of the instructions.   The patient was advised to call back or seek an in-person evaluation if the symptoms worsen or if the condition fails to improve as anticipated.  I provided 20 minutes of non-face-to-face time during this encounter.  Georgina Quint, MD  [facility]

## 2020-08-29 ENCOUNTER — Encounter: Payer: Self-pay | Admitting: Emergency Medicine

## 2020-09-01 ENCOUNTER — Encounter: Payer: Self-pay | Admitting: Emergency Medicine

## 2020-09-03 ENCOUNTER — Other Ambulatory Visit: Payer: Self-pay

## 2020-09-03 ENCOUNTER — Encounter: Payer: Self-pay | Admitting: Registered Nurse

## 2020-09-03 ENCOUNTER — Ambulatory Visit (INDEPENDENT_AMBULATORY_CARE_PROVIDER_SITE_OTHER): Payer: Medicare Other | Admitting: Registered Nurse

## 2020-09-03 VITALS — BP 130/77 | HR 69 | Temp 98.2°F | Resp 18 | Ht 66.0 in | Wt 169.8 lb

## 2020-09-03 DIAGNOSIS — N481 Balanitis: Secondary | ICD-10-CM | POA: Diagnosis not present

## 2020-09-03 MED ORDER — NYSTATIN 100000 UNIT/GM EX POWD: 1.0000 "application " | Freq: Three times a day (TID) | CUTANEOUS | 0 refills | Status: DC

## 2020-09-03 MED ORDER — FLUCONAZOLE 150 MG PO TABS: 150.0000 mg | ORAL_TABLET | Freq: Once | ORAL | 0 refills | Status: AC

## 2020-09-03 NOTE — Progress Notes (Signed)
Acute Office Visit  Subjective:    Patient ID: Aaron [name], male    DOB: 11-11-1950, 70 y.o.   MRN: [id number]  Chief Complaint  Patient presents with  . Follow-up    Patient states he is here for an follow up on rash in the groin area that keeps coming back.    HPI Patient is in today for ongoing rash  Balanitis of suspected candidal origin Started after starting farxiga Stopped, used topical steroid last summer Helped temporarily Was given fluconazole about 1 month ago Helped a lot, but rash returned No new symptoms Red, roundish lesions at base of glans No ulceration No chance of sti  Past Medical History:  Diagnosis Date  . Diabetes mellitus without complication (HCC)   . GERD (gastroesophageal reflux disease)   . Hyperlipidemia   . Hypertension   . Psoriasis     No past surgical history on file.  Family History  Problem Relation Age of Onset  . Cancer Mother        breast ca spread to bone  . Cancer Father 29       brain cancer  . Migraines Neg Hx   . Headache Neg Hx     Social History   Socioeconomic History  . Marital status: Married    Spouse name: Not on file  . Number of children: 0  . Years of education: Not on file  . Highest education level: High school graduate  Occupational History  . Occupation: Airline pilot, retired   Tobacco Use  . Smoking status: Former Smoker    Types: Cigarettes    Quit date: 1985    Years since quitting: 37.0  . Smokeless tobacco: Never Used  Vaping Use  . Vaping Use: Never used  Substance and Sexual Activity  . Alcohol use: Yes    Alcohol/week: 7.0 standard drinks    Types: 7 Cans of beer per week    Comment: 1 beer with dinner usually daily  . Drug use: No  . Sexual activity: Yes  Other Topics Concern  . Not on file  Social History Narrative   Marital status:  Married.             Children:   none      Lives:      Employment:  Airline pilot      Tobacco:  none      Alcohol:  About 7 drinks per week       Drugs:   none      Exercise:      Seatbelt:      Education: [employer].      Lives at home with wife   Right handed   Caffeine: 2 cups/day   Social Determinants of Health   Financial Resource Strain: Not on file  Food Insecurity: Not on file  Transportation Needs: Not on file  Physical Activity: Not on file  Stress: Not on file  Social Connections: Not on file  Intimate Partner Violence: Not on file    Outpatient Medications Prior to Visit  Medication Sig Dispense Refill  . amLODipine (NORVASC) 2.5 MG tablet Take 1 tablet (2.5 mg total) by mouth daily. 90 tablet 3  . fluocinolone (VANOS) 0.01 % cream Apply topically 2 (two) times daily. 30 g 3  . glipiZIDE (GLUCOTROL) 5 MG tablet TAKE 1 TABLET(5 MG) BY MOUTH DAILY BEFORE BREAKFAST 90 tablet 1  . melatonin 5 MG TABS Take 5 mg by mouth.    Marland Kitchen  metFORMIN (GLUCOPHAGE-XR) 500 MG 24 hr tablet TAKE 1 TABLET BY MOUTH DAILY AT 8 PM 180 tablet 0  . Multiple Vitamin (MULTI-VITAMIN PO) Take by mouth daily.    . rosuvastatin (CRESTOR) 10 MG tablet Take 1 tablet (10 mg total) by mouth daily. 90 tablet 3  . vitamin B-12 (CYANOCOBALAMIN) 500 MCG tablet Take 500 mcg by mouth daily.     No facility-administered medications prior to visit.    Allergies  Allergen Reactions  . Erythromycin Itching  . Levaquin [Levofloxacin In D5w]     Light sensitivity     Review of Systems Per hpi      Objective:    Physical Exam Vitals and nursing note reviewed.  Constitutional:      Appearance: Normal appearance.  Cardiovascular:     Rate and Rhythm: Normal rate and regular rhythm.     Pulses: Normal pulses.     Heart sounds: Normal heart sounds. No murmur heard. No friction rub. No gallop.   Pulmonary:     Effort: Pulmonary effort is normal. No respiratory distress.     Breath sounds: Normal breath sounds. No stridor. No wheezing, rhonchi or rales.  Skin:    General: Skin is warm and dry.     Coloration: Skin is not jaundiced or pale.      Findings: Rash (red round spots at base of glans ) present. No bruising or erythema.  Neurological:     General: No focal deficit present.     Mental Status: He is alert. Mental status is at baseline.  Psychiatric:        Mood and Affect: Mood normal.        Behavior: Behavior normal.        Thought Content: Thought content normal.        Judgment: Judgment normal.     BP 130/77   Pulse 69   Temp 98.2 F (36.8 C) (Temporal)   Resp 18   Ht 5\' 6"  (1.676 m)   Wt 169 lb 12.8 oz (77 kg)   SpO2 98%   BMI 27.41 kg/m  Wt Readings from Last 3 Encounters:  09/03/20 169 lb 12.8 oz (77 kg)  08/21/20 156 lb 6.4 oz (70.9 kg)  07/23/20 163 lb (73.9 kg)    There are no preventive care reminders to display for this patient.  There are no preventive care reminders to display for this patient.   Lab Results  Component Value Date   TSH 2.750 04/30/2020   Lab Results  Component Value Date   WBC 6.6 05/29/2020   HGB 15.7 05/29/2020   HCT 48.8 05/29/2020   MCV 84.0 05/29/2020   PLT 284 05/29/2020   Lab Results  Component Value Date   NA 142 06/12/2020   K 4.9 06/12/2020   CO2 27 06/12/2020   GLUCOSE 103 (H) 06/12/2020   BUN 14 06/12/2020   CREATININE 0.75 (L) 06/12/2020   BILITOT 0.4 06/12/2020   ALKPHOS 93 06/12/2020   AST 14 06/12/2020   ALT 12 06/12/2020   PROT 6.3 06/12/2020   ALBUMIN 4.3 06/12/2020   CALCIUM 9.2 06/12/2020   Lab Results  Component Value Date   CHOL 133 11/27/2019   Lab Results  Component Value Date   HDL 40 11/27/2019   Lab Results  Component Value Date   LDLCALC 67 11/27/2019   Lab Results  Component Value Date   TRIG 151 (H) 11/27/2019   Lab Results  Component Value Date  CHOLHDL 3.3 11/27/2019   Lab Results  Component Value Date   HGBA1C 5.6 06/12/2020       Assessment & Plan:   Problem List Items Addressed This Visit   None   Visit Diagnoses    Balanitis    -  Primary   Relevant Medications   fluconazole (DIFLUCAN)  150 MG tablet   nystatin (MYCOSTATIN/NYSTOP) powder       Meds ordered this encounter  Medications  . fluconazole (DIFLUCAN) 150 MG tablet    Sig: Take 1 tablet (150 mg total) by mouth once for 1 dose. Repeat in 3 days.    Dispense:  2 tablet    Refill:  0    Order Specific Question:   Supervising Provider    Answer:   Carlota Raspberry, JEFFREY R [[id number]]  . nystatin (MYCOSTATIN/NYSTOP) powder    Sig: Apply 1 application topically 3 (three) times daily.    Dispense:  15 g    Refill:  0    Order Specific Question:   Supervising Provider    Answer:   Carlota Raspberry, JEFFREY R [[id number]]   PLAN  Suspect recurrence  Will give two dose of fluconazole and topical  Return prn  Patient encouraged to call clinic with any questions, comments, or concerns.   Maximiano Coss, NP

## 2020-09-03 NOTE — Patient Instructions (Signed)
° ° ° °  If you have lab work done today you will be contacted with your lab results within the next 2 weeks.  If you have not heard from us then please contact us. The fastest way to get your results is to register for My Chart. ° ° °IF you received an x-ray today, you will receive an invoice from [facility]. Please contact  Radiology at [phone] with questions or concerns regarding your invoice.  ° °IF you received labwork today, you will receive an invoice from [facility]. Please contact [facility] at [phone] with questions or concerns regarding your invoice.  ° °Our billing staff will not be able to assist you with questions regarding bills from these companies. ° °You will be contacted with the lab results as soon as they are available. The fastest way to get your results is to activate your My Chart account. Instructions are located on the last page of this paperwork. If you have not heard from us regarding the results in 2 weeks, please contact this office. °  ° ° ° °

## 2020-09-15 ENCOUNTER — Encounter: Payer: Self-pay | Admitting: Emergency Medicine

## 2020-09-16 ENCOUNTER — Other Ambulatory Visit: Payer: Self-pay | Admitting: Emergency Medicine

## 2020-09-16 MED ORDER — CLOTRIMAZOLE-BETAMETHASONE 1-0.05 % EX CREA: 1.0000 "application " | TOPICAL_CREAM | Freq: Two times a day (BID) | CUTANEOUS | 1 refills | Status: DC

## 2020-09-16 MED ORDER — FLUCONAZOLE 150 MG PO TABS: 150.0000 mg | ORAL_TABLET | Freq: Once | ORAL | 0 refills | Status: AC

## 2020-09-16 NOTE — Telephone Encounter (Signed)
Take fluconazole 150 mg 1 more time and switch powder to Lotrisone cream.  I will send the new prescriptions to his pharmacy of record.  Thanks.

## 2020-09-16 NOTE — Telephone Encounter (Signed)
If pt rash is not improving since Rx 09/03/2020 what should be done next? Please advise

## 2020-09-17 ENCOUNTER — Telehealth: Payer: Self-pay | Admitting: Emergency Medicine

## 2020-09-17 NOTE — Telephone Encounter (Signed)
Received a fax from after hours regarding the pt message exchange from encounter 09/15/20. Pt would like a nurse to call him regarding that. Please advise.

## 2020-09-17 NOTE — Telephone Encounter (Signed)
Pt called back this was a confusion on generic vs brand name spoke with him he is aware and informed

## 2020-09-30 NOTE — Progress Notes (Signed)
Office Visit Note  Patient: Aaron [name]             Date of Birth: 1950-12-22           MRN: [id number]             PCP: [name], MD Referring: [name], * Visit Date: 10/01/2020   Subjective:  Other (Bilateral hand and foot coldness, denies discoloration. Patient reports small patch of psoriasis on left elbow. )   History of Present Illness: Aaron [name] is a 70 y.o. male with history of CAD, T2DM, GERD, HTN here for evaluation of peripheral paresthesias with suspected raynaud's phenomenon. He has fairly well controlled diabetes especially recently.  Symptoms started around July of last year he does not recall any preceding events or medical changes.  He describes feeling very cold in his fingers and toes not associated with particular numbness and no visible discoloration or changes.  This improves with rewarming but he found his having to do this frequently even when not in particular cold temperatures.  During the colder weather this winter he has noticed increase in the symptoms.  Denies any ulcers pitting or other local skin changes.  He underwent testing for peripheral arterial disease that did not show any significant defect. He does also have psoriasis currently with small amount of scalp disease activity and plaques over the left elbow extensor surface.  This is treated with the topical steroid he denies any widespread skin disease or that this has recently flared up. He has also been experiencing headaches since this time not particularly associated with the cold digits timing.    Activities of Daily Living:  Patient reports morning stiffness for 0 minutes.   Patient Denies nocturnal pain.  Difficulty dressing/grooming: Denies Difficulty climbing stairs: Denies Difficulty getting out of chair: Denies Difficulty using hands for taps, buttons, cutlery, and/or writing: Denies  Review of Systems  Constitutional: Positive for fatigue.  HENT:  Positive for mouth dryness. Negative for mouth sores and nose dryness.   Eyes: Positive for dryness. Negative for pain and itching.  Respiratory: Negative for shortness of breath and difficulty breathing.   Cardiovascular: Negative for chest pain and palpitations.  Gastrointestinal: Negative for blood in stool, constipation and diarrhea.  Endocrine: Negative for increased urination.  Genitourinary: Negative for difficulty urinating.  Musculoskeletal: Negative for arthralgias, joint pain, joint swelling, myalgias, morning stiffness, muscle tenderness and myalgias.  Skin: Positive for rash. Negative for color change.  Allergic/Immunologic: Negative for susceptible to infections.  Neurological: Positive for dizziness and headaches. Negative for numbness, memory loss and weakness.  Hematological: Negative for bruising/bleeding tendency.  Psychiatric/Behavioral: Positive for sleep disturbance. Negative for confusion.    PMFS History:  Patient Active Problem List   Diagnosis Date Noted  . Cold finger without peripheral vascular disease 10/01/2020  . Other psoriasis 10/01/2020  . Coronary artery calcification 06/12/2020  . History of palpitations 06/12/2020  . Tingling of both feet 06/12/2020  . Erythrocytosis 05/08/2020  . Weight loss, non-intentional 05/08/2020  . Night sweats 05/08/2020  . Hypertension associated with diabetes ([place]) 05/29/2019  . Gastroesophageal reflux disease without esophagitis 03/13/2017  . Dyslipidemia associated with type 2 diabetes mellitus ([place]) 03/13/2017    Past Medical History:  Diagnosis Date  . Diabetes mellitus without complication ([place])   . GERD (gastroesophageal reflux disease)   . Hyperlipidemia   . Hypertension   . Psoriasis     Family History  Problem Relation Age of Onset  .  Cancer Mother        breast ca spread to bone  . Cancer Father 41       brain cancer  . Migraines Neg Hx   . Headache Neg Hx    History reviewed. No pertinent  surgical history. Social History   Social History Narrative   Marital status:  Married.             Children:   none      Lives:      Employment:  Press photographer      Tobacco:  none      Alcohol:  About 7 drinks per week      Drugs:   none      Exercise:      Seatbelt:      Education: [employer].      Lives at home with wife   Right handed   Caffeine: 2 cups/day   Immunization History  Administered Date(s) Administered  . Fluad Quad(high Dose 65+) 05/29/2019, 06/06/2020  . Influenza,inj,Quad PF,6+ Mos 06/28/2014, 04/29/2015, 07/13/2016, 05/31/2017, 06/11/2018  . Moderna Sars-Covid-2 Vaccination 11/09/2019, 12/12/2019  . Pneumococcal Conjugate-13 04/29/2015, 03/25/2018  . Pneumococcal Polysaccharide-23 04/06/2014, 05/29/2019  . Td 03/30/2016  . Tdap 12/21/2005  . Zoster 11/25/2015     Objective: Vital Signs: BP (!) 144/80 (BP Location: Right Arm, Patient Position: Sitting, Cuff Size: Normal)   Pulse 73   Resp 15   Ht 5' 4.75" (1.645 m)   Wt 160 lb (72.6 kg)   BMI 26.83 kg/m    Physical Exam HENT:     Right Ear: External ear normal.     Left Ear: External ear normal.     Mouth/Throat:     Mouth: Mucous membranes are moist.     Pharynx: Oropharynx is clear.  Eyes:     Conjunctiva/sclera: Conjunctivae normal.  Cardiovascular:     Rate and Rhythm: Normal rate and regular rhythm.  Pulmonary:     Effort: Pulmonary effort is normal.     Breath sounds: Normal breath sounds.  Skin:    General: Skin is warm and dry.     Comments: Faint erythematous patch over occiput A few small erythematous plaques on left elbow extensor surface Rough mildly hyperkeratotic skin over hands bilaterally Few petechiae over anterior and medial shins bilaterally no appreciable edema  Neurological:     General: No focal deficit present.     Mental Status: He is alert.  Psychiatric:        Mood and Affect: Mood normal.     Musculoskeletal Exam:  Shoulder, elbow, wrist, fingers full range  of motion no tenderness or swelling Knees, ankles, MTPs full range of motion no tenderness or swelling   Investigation: No additional findings.  Imaging: No results found.  Recent Labs: Lab Results  Component Value Date   WBC 6.6 05/29/2020   HGB 15.7 05/29/2020   PLT 284 05/29/2020   NA 142 06/12/2020   K 4.9 06/12/2020   CL 103 06/12/2020   CO2 27 06/12/2020   GLUCOSE 103 (H) 06/12/2020   BUN 14 06/12/2020   CREATININE 0.75 (L) 06/12/2020   BILITOT 0.4 06/12/2020   ALKPHOS 93 06/12/2020   AST 14 06/12/2020   ALT 12 06/12/2020   PROT 6.3 06/12/2020   ALBUMIN 4.3 06/12/2020   CALCIUM 9.2 06/12/2020   GFRAA 110 06/12/2020    Speciality Comments: No specialty comments available.  Procedures:  No procedures performed Allergies: Erythromycin and Levaquin [levofloxacin in d5w]  Assessment / Plan:     Visit Diagnoses: Cold finger without peripheral vascular disease - Plan: ANA, Anti-scleroderma antibody, RNP Antibody, Sjogrens syndrome-A extractable nuclear antibody, Centromere Antibodies, Jo-1 antibody-IgG  Cold fingertips but does not describe typical Raynaud's phenomenon.  Denies discoloration or sharply demarcated areas of finger change.  He does describe this peripheral sensation without associated neuropathy most of the time and hands and has intact feeling and strength.  Nailfold capillaries were difficult to visualize due to hyperkeratotic and disrupted skin on the hands they did seem possibly reduced in number.  Checking autoantibody serology including ANA, SCL 70, centromere, SSA, Jo 1 for secondary causes of raynaud's.  Currently on very low-dose amlodipine 2.5 mg daily. He is also curious about toxic causes such as heavy metal exposures the review of his occupational history was not strongly suggestive for this.  I recommended could consider additional testing for this if no inflammatory cause identified or may defer that to PCP office.  Tingling of both feet    No changes seen on physical exam such as lesions or digital fat pad pitting.  With normal recent peripheral vascular study.  Scattered petechiae on shins could be some edema present but not currently and not recommending treatment for this unless noticeable.  Other psoriasis  Low total body surface area involvement at this time seems to be reasonably controlled on topical therapies.  No clinical findings suggesting psoriatic arthritis.  Toe nail changes seem more consistent with onychomycosis vs psoriasis.  Orders: Orders Placed This Encounter  Procedures  . ANA  . Anti-scleroderma antibody  . RNP Antibody  . Sjogrens syndrome-A extractable nuclear antibody  . Centromere Antibodies  . Jo-1 antibody-IgG   No orders of the defined types were placed in this encounter.   Follow-Up Instructions: No follow-ups on file.   Collier Salina, MD  Note - This record has been created using [employer].  Chart creation errors have been sought, but may not always  have been located. Such creation errors do not reflect on  the standard of medical care.

## 2020-10-01 ENCOUNTER — Ambulatory Visit (INDEPENDENT_AMBULATORY_CARE_PROVIDER_SITE_OTHER): Payer: Medicare Other | Admitting: Internal Medicine

## 2020-10-01 ENCOUNTER — Other Ambulatory Visit: Payer: Self-pay

## 2020-10-01 ENCOUNTER — Encounter: Payer: Self-pay | Admitting: Internal Medicine

## 2020-10-01 VITALS — BP 144/80 | HR 73 | Resp 15 | Ht 64.75 in | Wt 160.0 lb

## 2020-10-01 DIAGNOSIS — R209 Unspecified disturbances of skin sensation: Secondary | ICD-10-CM

## 2020-10-01 DIAGNOSIS — R202 Paresthesia of skin: Secondary | ICD-10-CM | POA: Diagnosis not present

## 2020-10-01 DIAGNOSIS — L408 Other psoriasis: Secondary | ICD-10-CM | POA: Insufficient documentation

## 2020-10-01 NOTE — Patient Instructions (Signed)
   Antinuclear Antibody Test Why am I having this test? This is a test that is used to help diagnose systemic lupus erythematosus (SLE) and other autoimmune diseases. An autoimmune disease is a disease in which the body's own defense (immune)system attacks its organs. What is being tested? This test checks for antinuclear antibodies (ANA) in the blood. The presence of ANA is associated with several autoimmune diseases. It is seen in almost all patients with lupus. What kind of sample is taken? A blood sample is required for this test. It is usually collected by inserting a needle into a blood vessel.   How are the results reported? Your test results will be reported as either positive or negative. A false-positive result can occur. A false positive is incorrect because it means that a condition is present when it is not. What do the results mean? A positive test result may mean that you have:  Lupus.  Other autoimmune diseases, such as rheumatoid arthritis, scleroderma, or Sjgren syndrome. Conditions that may cause a false-positive result include:  Liver dysfunction.  Myasthenia gravis.  Infectious mononucleosis. Talk with your health care provider about what your results mean. Questions to ask your health care provider Ask your health care provider, or the department that is doing the test:  When will my results be ready?  How will I get my results?  What are my treatment options?  What other tests do I need?  What are my next steps? Summary  This is a test that is used to help diagnose systemic lupus erythematosus (SLE) and other autoimmune diseases. An autoimmune disease is a disease in which the body's own defense (immune)system attacks the body.  This test checks for antinuclear antibodies (ANA) in the blood. The presence of ANA is associated with several autoimmune diseases. It is seen in almost all patients with lupus.  Your test results will be reported as either  positive or negative. Talk with your health care provider about what your results mean.

## 2020-10-03 ENCOUNTER — Telehealth: Payer: Self-pay | Admitting: Internal Medicine

## 2020-10-03 NOTE — Telephone Encounter (Signed)
I spoke with [name] his results so far are all negative. ANA titer and pattern remain pending. At this time I expect we are unlikely to have positive results indicating an inflammatory condition as cause of his peripheral discoloration and paresthesias. Will contact him again after last result returns.

## 2020-10-03 NOTE — Telephone Encounter (Signed)
Patient was calling in reference to lab results. Patient was able to see some results on My Chart, and was calling to see if he could speak to Dr. About labs.

## 2020-10-04 LAB — CENTROMERE ANTIBODIES: Centromere Ab Screen: <1 AI

## 2020-10-04 LAB — JO-1 ANTIBODY-IGG: Jo-1 Autoabs: <1 AI

## 2020-10-04 LAB — ANA: Anti Nuclear Antibody (ANA): NEGATIVE

## 2020-10-04 LAB — RNP ANTIBODY: Ribonucleic Protein(ENA) Antibody, IgG: <1 AI

## 2020-10-04 LAB — ANTI-SCLERODERMA ANTIBODY: Scleroderma (Scl-70) (ENA) Antibody, IgG: <1 AI

## 2020-10-04 LAB — SJOGRENS SYNDROME-A EXTRACTABLE NUCLEAR ANTIBODY: SSA (Ro) (ENA) Antibody, IgG: <1 AI

## 2020-10-04 NOTE — Progress Notes (Signed)
Lab results are all negative for evidence of a systemic inflammatory disease as underlying cause of his peripheral circulation or sensation problem. This does not exclude toxic exposure related symptoms but these would not need specialty medication treatment with our clinic so recommend he follow up with his PCP or vascular doctor regarding if they think testing such as lead, arsenic, etc needed.

## 2020-10-15 ENCOUNTER — Encounter: Payer: Self-pay | Admitting: Family Medicine

## 2020-10-15 ENCOUNTER — Ambulatory Visit (INDEPENDENT_AMBULATORY_CARE_PROVIDER_SITE_OTHER): Payer: Medicare Other | Admitting: Family Medicine

## 2020-10-15 ENCOUNTER — Other Ambulatory Visit: Payer: Self-pay

## 2020-10-15 VITALS — BP 136/82 | HR 76 | Temp 98.1°F | Ht 64.75 in | Wt 159.0 lb

## 2020-10-15 DIAGNOSIS — E1165 Type 2 diabetes mellitus with hyperglycemia: Secondary | ICD-10-CM

## 2020-10-15 NOTE — Progress Notes (Signed)
 2/15/202212:22 PM  Aaron [name] 07/22/1951, 69 y.o., male [id number]  Chief Complaint  Patient presents with  . bumped head     Sunday night - iced , still tender, slight h/a - chronic also - took 2 excedrin pm for 2 days     HPI:   Patient is a 69 y.o. male with past medical history significant for DM who presents today for bumped head.  Was in the attic Hit head on the rafter Noticed a bump, still slightly tender Continued to work after bumped head Noticed a headache took 2 excedrin PM that evening Slept well that night, same regimen Monday night This morning still slightly sore and slight headache Headache has been ongoing for quite some time Has seen neurologist for this   Depression screen PHQ 2/9 10/15/2020 09/03/2020 08/21/2020  Decreased Interest 0 0 0  Down, Depressed, Hopeless 0 0 0  PHQ - 2 Score 0 0 0  Altered sleeping 0 - 1  Tired, decreased energy 0 - 2  Change in appetite 0 - 0  Feeling bad or failure about yourself  0 - 0  Trouble concentrating 0 - 0  Moving slowly or fidgety/restless 0 - 0  Suicidal thoughts 0 - 0  PHQ-9 Score 0 - 3  Difficult doing work/chores - - -  Some recent data might be hidden    Fall Risk  10/15/2020 09/03/2020 08/21/2020 07/23/2020 06/12/2020  Falls in the past year? 0 0 0 0 0  Comment - - - - -  Number falls in past yr: 0 0 - - -  Injury with Fall? 0 0 - - -  Comment - - - - -  Risk Factor Category  - - - - -  Follow up Falls evaluation completed Falls evaluation completed Falls evaluation completed Falls evaluation completed Falls evaluation completed     Allergies  Allergen Reactions  . Erythromycin Itching  . Levaquin [Levofloxacin In D5w]     Light sensitivity     Prior to Admission medications   Medication Sig Start Date End Date Taking? Authorizing Provider  amLODipine (NORVASC) 2.5 MG tablet Take 1 tablet (2.5 mg total) by mouth daily. 07/05/20   Hochrein, James, MD  clotrimazole-betamethasone  (LOTRISONE) cream Apply 1 application topically 2 (two) times daily. 09/16/20   Sagardia, Miguel Jose, MD  fluocinolone (VANOS) 0.01 % cream Apply topically 2 (two) times daily. 02/05/20   Sagardia, Miguel Jose, MD  glipiZIDE (GLUCOTROL) 5 MG tablet TAKE 1 TABLET(5 MG) BY MOUTH DAILY BEFORE BREAKFAST 03/05/20   Sagardia, Miguel Jose, MD  melatonin 5 MG TABS Take 5 mg by mouth. Patient not taking: Reported on 10/01/2020    [provider]  metFORMIN (GLUCOPHAGE-XR) 500 MG 24 hr tablet TAKE 1 TABLET BY MOUTH DAILY AT 8 PM 03/18/20   Sagardia, Miguel Jose, MD  Multiple Vitamin (MULTI-VITAMIN PO) Take by mouth daily.    [provider]  nystatin (MYCOSTATIN/NYSTOP) powder Apply 1 application topically 3 (three) times daily. Patient not taking: Reported on 10/01/2020 09/03/20   Morrow, Richard, NP  rosuvastatin (CRESTOR) 10 MG tablet Take 1 tablet (10 mg total) by mouth daily. 06/12/20   Sagardia, Miguel Jose, MD  vitamin B-12 (CYANOCOBALAMIN) 500 MCG tablet Take 500 mcg by mouth daily.    [provider]    Past Medical History:  Diagnosis Date  . Diabetes mellitus without complication (HCC)   . GERD (gastroesophageal reflux disease)   . Hyperlipidemia   . Hypertension   .   Psoriasis     No past surgical history on file.  Social History   Tobacco Use  . Smoking status: Former Smoker    Types: Cigarettes    Quit date: 1985    Years since quitting: 37.1  . Smokeless tobacco: Never Used  Substance Use Topics  . Alcohol use: Yes    Alcohol/week: 7.0 standard drinks    Types: 7 Cans of beer per week    Comment: 1 beer with dinner usually daily    Family History  Problem Relation Age of Onset  . Cancer Mother        breast ca spread to bone  . Cancer Father 90       brain cancer  . Migraines Neg Hx   . Headache Neg Hx     Review of Systems  Constitutional: Negative for chills, fever and malaise/fatigue.  Eyes: Negative for blurred vision, double vision and  photophobia.  Respiratory: Negative for cough, shortness of breath and wheezing.   Cardiovascular: Negative for chest pain, palpitations and leg swelling.  Gastrointestinal: Negative for abdominal pain, blood in stool, constipation, diarrhea, heartburn, nausea and vomiting.  Genitourinary: Negative for dysuria, frequency and hematuria.  Musculoskeletal: Negative for back pain, falls and joint pain.  Skin: Negative for rash.  Neurological: Positive for headaches. Negative for dizziness, loss of consciousness and weakness.  Psychiatric/Behavioral: Negative for hallucinations and memory loss. The patient does not have insomnia.      OBJECTIVE:  Today's Vitals   10/15/20 1136  BP: 136/82  Pulse: 76  Temp: 98.1 F (36.7 C)  SpO2: 97%  Weight: 159 lb (72.1 kg)  Height: 5' 4.75" (1.645 m)   Body mass index is 26.66 kg/m.   Physical Exam Vitals reviewed.  Constitutional:      Appearance: Normal appearance.  HENT:     Head: Normocephalic and atraumatic.  Eyes:     General:        Right eye: No discharge.        Left eye: No discharge.     Conjunctiva/sclera: Conjunctivae normal.     Pupils: Pupils are equal, round, and reactive to light.  Cardiovascular:     Rate and Rhythm: Normal rate and regular rhythm.     Pulses: Normal pulses.     Heart sounds: Normal heart sounds. No murmur heard. No friction rub. No gallop.   Pulmonary:     Effort: Pulmonary effort is normal. No respiratory distress.     Breath sounds: Normal breath sounds. No stridor. No wheezing or rales.  Abdominal:     General: Bowel sounds are normal.     Palpations: Abdomen is soft.     Tenderness: There is no abdominal tenderness.  Musculoskeletal:     Right lower leg: No edema.     Left lower leg: No edema.  Skin:    General: Skin is warm and dry.  Neurological:     General: No focal deficit present.     Mental Status: He is alert and oriented to person, place, and time. Mental status is at baseline.      Sensory: No sensory deficit.     Motor: No weakness.     Coordination: Coordination normal.     Gait: Gait normal.  Psychiatric:        Mood and Affect: Mood normal.        Behavior: Behavior normal.        Thought Content: Thought content normal.       No results found for this or any previous visit (from the past 24 hour(s)).  No results found.   ASSESSMENT and PLAN  Problem List Items Addressed This Visit   None   Visit Diagnoses    Type 2 diabetes mellitus with hyperglycemia, without long-term current use of insulin ([place])    -  Primary   Relevant Orders   Hemoglobin A1c   Lipid Panel   CMP14+EGFR      Plan . Declines head CT at this time, r/se/b discussed . RTC/ED precautions provided . Continue OTC treatments for pain, questions answered on progression . Will follow up with lab results.   Return in about 3 months (around 01/12/2021).    Huston Foley Ivelisse Culverhouse, FNP-BC [facility] [place], [place] [address] Ph.  [phone] Fax [phone]

## 2020-10-15 NOTE — Patient Instructions (Addendum)
General Headache Without Cause A headache is pain or discomfort that is felt around the head or neck area. There are many causes and types of headaches. In some cases, the cause may not be found. Follow these instructions at home: Watch your condition for any changes. Let your doctor know about them. Take these steps to help with your condition: Managing pain  Take over-the-counter and prescription medicines only as told by your doctor.  Lie down in a dark, quiet room when you have a headache.  If told, put ice on your head and neck area: ? Put ice in a plastic bag. ? Place a towel between your skin and the bag. ? Leave the ice on for 20 minutes, 2-3 times per day.  If told, put heat on the affected area. Use the heat source that your doctor recommends, such as a moist heat pack or a heating pad. ? Place a towel between your skin and the heat source. ? Leave the heat on for 20-30 minutes. ? Remove the heat if your skin turns bright red. This is very important if you are unable to feel pain, heat, or cold. You may have a greater risk of getting burned.  Keep lights dim if bright lights bother you or make your headaches worse.      Eating and drinking  Eat meals on a regular schedule.  If you drink alcohol: ? Limit how much you use to:  0-1 drink a day for women.  0-2 drinks a day for men. ? Be aware of how much alcohol is in your drink. In the [place] one drink equals one 12 oz bottle of beer (355 mL), one 5 oz glass of wine (148 mL), or one 1 oz glass of hard liquor (44 mL).  Stop drinking caffeine, or reduce how much caffeine you drink. General instructions  Keep a journal to find out if certain things bring on headaches. For example, write down: ? What you eat and drink. ? How much sleep you get. ? Any change to your diet or medicines.  Get a massage or try other ways to relax.  Limit stress.  Sit up straight. Do not tighten (tense) your muscles.  Do not use  any products that contain nicotine or tobacco. This includes cigarettes, e-cigarettes, and chewing tobacco. If you need help quitting, ask your doctor.  Exercise regularly as told by your doctor.  Get enough sleep. This often means 7-9 hours of sleep each night.  Keep all follow-up visits as told by your doctor. This is important.   Contact a doctor if:  Your symptoms are not helped by medicine.  You have a headache that feels different than the other headaches.  You feel sick to your stomach (nauseous) or you throw up (vomit).  You have a fever. Get help right away if:  Your headache gets very bad quickly.  Your headache gets worse after a lot of physical activity.  You keep throwing up.  You have a stiff neck.  You have trouble seeing.  You have trouble speaking.  You have pain in the eye or ear.  Your muscles are weak or you lose muscle control.  You lose your balance or have trouble walking.  You feel like you will pass out (faint) or you pass out.  You are mixed up (confused).  You have a seizure. Summary  A headache is pain or discomfort that is felt around the head or neck area.  There  are many causes and types of headaches. In some cases, the cause may not be found.  Keep a journal to help find out what causes your headaches. Watch your condition for any changes. Let your doctor know about them.  Contact a doctor if you have a headache that is different from usual, or if your headache is not helped by medicine.  Get help right away if your headache gets very bad, you throw up, you have trouble seeing, you lose your balance, or you have a seizure. This information is not intended to replace advice given to you by your health care provider. Make sure you discuss any questions you have with your health care provider. Document Revised: 03/07/2018 Document Reviewed: 03/07/2018 Elsevier Patient Education  2021 [employer].   If you have lab work done today  you will be contacted with your lab results within the next 2 weeks.  If you have not heard from [place] then please contact us. The fastest way to get your results is to register for My Chart.   IF you received an x-ray today, you will receive an invoice from [facility]. Please contact [facility] at [phone] with questions or concerns regarding your invoice.   IF you received labwork today, you will receive an invoice from [facility]. Please contact [facility] at [phone] with questions or concerns regarding your invoice.   Our billing staff will not be able to assist you with questions regarding bills from these companies.  You will be contacted with the lab results as soon as they are available. The fastest way to get your results is to activate your My Chart account. Instructions are located on the last page of this paperwork. If you have not heard from [place] regarding the results in 2 weeks, please contact this office.

## 2020-10-16 LAB — LIPID PANEL
Chol/HDL Ratio: 2.8 ratio (ref 0.0–5.0)
Cholesterol, Total: 135 mg/dL (ref 100–199)
HDL: 48 mg/dL (ref >39)
LDL Chol Calc (NIH): 71 mg/dL (ref 0–99)
Triglycerides: 84 mg/dL (ref 0–149)
VLDL Cholesterol Cal: 16 mg/dL (ref 5–40)

## 2020-10-16 LAB — HEMOGLOBIN A1C
Est. average glucose Bld gHb Est-mCnc: 117 mg/dL
Hgb A1c MFr Bld: 5.7 % — ABNORMAL HIGH (ref 4.8–5.6)

## 2020-10-16 LAB — CMP14+EGFR
ALT: 17 IU/L (ref 0–44)
AST: 21 IU/L (ref 0–40)
Albumin/Globulin Ratio: 1.6 (ref 1.2–2.2)
Albumin: 4.2 g/dL (ref 3.8–4.8)
Alkaline Phosphatase: 77 IU/L (ref 44–121)
BUN/Creatinine Ratio: 19 (ref 10–24)
BUN: 13 mg/dL (ref 8–27)
Bilirubin Total: 0.4 mg/dL (ref 0.0–1.2)
CO2: 22 mmol/L (ref 20–29)
Calcium: 9.5 mg/dL (ref 8.6–10.2)
Chloride: 102 mmol/L (ref 96–106)
Creatinine, Ser: 0.67 mg/dL — ABNORMAL LOW (ref 0.76–1.27)
GFR calc Af Amer: 115 mL/min/{1.73_m2} (ref >59)
GFR calc non Af Amer: 99 mL/min/{1.73_m2} (ref >59)
Globulin, Total: 2.6 g/dL (ref 1.5–4.5)
Glucose: 135 mg/dL — ABNORMAL HIGH (ref 65–99)
Potassium: 4.1 mmol/L (ref 3.5–5.2)
Sodium: 141 mmol/L (ref 134–144)
Total Protein: 6.8 g/dL (ref 6.0–8.5)

## 2020-10-22 ENCOUNTER — Other Ambulatory Visit: Payer: Self-pay

## 2020-10-22 ENCOUNTER — Ambulatory Visit (INDEPENDENT_AMBULATORY_CARE_PROVIDER_SITE_OTHER): Payer: Medicare Other | Admitting: Registered Nurse

## 2020-10-22 ENCOUNTER — Encounter: Payer: Self-pay | Admitting: Registered Nurse

## 2020-10-22 VITALS — BP 136/86 | HR 78 | Temp 98.0°F | Resp 18 | Ht 64.0 in | Wt 160.8 lb

## 2020-10-22 DIAGNOSIS — M545 Low back pain, unspecified: Secondary | ICD-10-CM

## 2020-10-22 DIAGNOSIS — G44319 Acute post-traumatic headache, not intractable: Secondary | ICD-10-CM

## 2020-10-22 DIAGNOSIS — W19XXXD Unspecified fall, subsequent encounter: Secondary | ICD-10-CM | POA: Diagnosis not present

## 2020-10-22 MED ORDER — MELOXICAM 7.5 MG PO TABS: 7.5000 mg | ORAL_TABLET | Freq: Every day | ORAL | 0 refills | Status: DC

## 2020-10-22 NOTE — Progress Notes (Signed)
Acute Office Visit  Subjective:    Patient ID: Aaron [name], male    DOB: 1950-09-11, 70 y.o.   MRN: [id number]  Chief Complaint  Patient presents with  . Fall    Patient states he fell down some stairs in his basement. Per patient he has a visit here on 10/15/2020 because he had bumped his head and still have a knot. After that date he has been having some Headaches and dizzy spells. He has some bruises today.    HPI Patient is in today for follow up for fall. Last week notes that he was working on Sunday (9 days before visit) when he hit his head on the rafters in the attic. Some acute pain but no concerning symptoms. Was seen in office by Huston Foley Just NP who advised watchful waiting.  Pt does note that lump on head is still present. Chronic headaches ongoing, quality the same but intensity worsened since hitting his head. Does note that he had one episode of dizziness that caused him to fall backwards on stairs as he was heading down, landing hard on his heel and tailbone. Aching pain and some bruising but full ROM, no crepitus. Unclear what caused dizziness. Has happened in the past but workup has been limited, no meds tried for this.  Otherwise notes vesicular rash on glans of penis has returned. Had given him clotrimazole-betamethasone in the past which had good effect. Wondering if he should resume use.   Past Medical History:  Diagnosis Date  . Diabetes mellitus without complication ([place])   . GERD (gastroesophageal reflux disease)   . Hyperlipidemia   . Hypertension   . Psoriasis     No past surgical history on file.  Family History  Problem Relation Age of Onset  . Cancer Mother        breast ca spread to bone  . Cancer Father 37       brain cancer  . Migraines Neg Hx   . Headache Neg Hx     Social History   Socioeconomic History  . Marital status: Married    Spouse name: Not on file  . Number of children: 0  . Years of education: Not on file  . Highest  education level: High school graduate  Occupational History  . Occupation: Press photographer, retired   Tobacco Use  . Smoking status: Former Smoker    Types: Cigarettes    Quit date: 1985    Years since quitting: 37.1  . Smokeless tobacco: Never Used  Vaping Use  . Vaping Use: Never used  Substance and Sexual Activity  . Alcohol use: Yes    Alcohol/week: 7.0 standard drinks    Types: 7 Cans of beer per week    Comment: 1 beer with dinner usually daily  . Drug use: No  . Sexual activity: Yes  Other Topics Concern  . Not on file  Social History Narrative   Marital status:  Married.             Children:   none      Lives:      Employment:  Press photographer      Tobacco:  none      Alcohol:  About 7 drinks per week      Drugs:   none      Exercise:      Seatbelt:      Education: [employer].      Lives at home with wife   Right  handed   Caffeine: 2 cups/day   Social Determinants of Health   Financial Resource Strain: Not on file  Food Insecurity: Not on file  Transportation Needs: Not on file  Physical Activity: Not on file  Stress: Not on file  Social Connections: Not on file  Intimate Partner Violence: Not on file    Outpatient Medications Prior to Visit  Medication Sig Dispense Refill  . amLODipine (NORVASC) 2.5 MG tablet Take 1 tablet (2.5 mg total) by mouth daily. 90 tablet 3  . clotrimazole-betamethasone (LOTRISONE) cream Apply 1 application topically 2 (two) times daily. 30 g 1  . fluocinolone (VANOS) 0.01 % cream Apply topically 2 (two) times daily. 30 g 3  . glipiZIDE (GLUCOTROL) 5 MG tablet TAKE 1 TABLET(5 MG) BY MOUTH DAILY BEFORE BREAKFAST 90 tablet 1  . melatonin 5 MG TABS Take 5 mg by mouth.    . metFORMIN (GLUCOPHAGE-XR) 500 MG 24 hr tablet TAKE 1 TABLET BY MOUTH DAILY AT 8 PM 180 tablet 0  . Multiple Vitamin (MULTI-VITAMIN PO) Take by mouth daily.    Marland Kitchen nystatin (MYCOSTATIN/NYSTOP) powder Apply 1 application topically 3 (three) times daily. 15 g 0  . rosuvastatin  (CRESTOR) 10 MG tablet Take 1 tablet (10 mg total) by mouth daily. 90 tablet 3  . vitamin B-12 (CYANOCOBALAMIN) 500 MCG tablet Take 500 mcg by mouth daily.     No facility-administered medications prior to visit.    Allergies  Allergen Reactions  . Erythromycin Itching  . Levaquin [Levofloxacin In D5w]     Light sensitivity     Review of Systems  Constitutional: Negative.   HENT: Negative.   Eyes: Negative.   Respiratory: Negative.   Cardiovascular: Negative.   Gastrointestinal: Negative.   Genitourinary: Negative.   Musculoskeletal: Positive for back pain and myalgias.  Skin: Negative.   Neurological: Positive for dizziness and headaches.  Psychiatric/Behavioral: Negative.   All other systems reviewed and are negative.      Objective:    Physical Exam Vitals and nursing note reviewed.  Constitutional:      Appearance: Normal appearance.  Cardiovascular:     Rate and Rhythm: Normal rate and regular rhythm.     Pulses: Normal pulses.     Heart sounds: Normal heart sounds. No murmur heard. No friction rub. No gallop.   Pulmonary:     Effort: Pulmonary effort is normal. No respiratory distress.     Breath sounds: Normal breath sounds. No stridor. No wheezing, rhonchi or rales.  Neurological:     General: No focal deficit present.     Mental Status: He is alert and oriented to person, place, and time. Mental status is at baseline.     Cranial Nerves: No cranial nerve deficit.     Motor: No weakness.     Gait: Gait normal.  Psychiatric:        Mood and Affect: Mood normal.        Behavior: Behavior normal.        Thought Content: Thought content normal.        Judgment: Judgment normal.     BP 136/86   Pulse 78   Temp 98 F (36.7 C) (Temporal)   Resp 18   Ht 5\' 4"  (1.626 m)   Wt 160 lb 12.8 oz (72.9 kg)   SpO2 98%   BMI 27.60 kg/m  Wt Readings from Last 3 Encounters:  10/22/20 160 lb 12.8 oz (72.9 kg)  10/15/20 159 lb (72.1 kg)  10/01/20 160 lb (72.6  kg)    There are no preventive care reminders to display for this patient.  There are no preventive care reminders to display for this patient.   Lab Results  Component Value Date   TSH 2.750 04/30/2020   Lab Results  Component Value Date   WBC 6.6 05/29/2020   HGB 15.7 05/29/2020   HCT 48.8 05/29/2020   MCV 84.0 05/29/2020   PLT 284 05/29/2020   Lab Results  Component Value Date   NA 141 10/15/2020   K 4.1 10/15/2020   CO2 22 10/15/2020   GLUCOSE 135 (H) 10/15/2020   BUN 13 10/15/2020   CREATININE 0.67 (L) 10/15/2020   BILITOT 0.4 10/15/2020   ALKPHOS 77 10/15/2020   AST 21 10/15/2020   ALT 17 10/15/2020   PROT 6.8 10/15/2020   ALBUMIN 4.2 10/15/2020   CALCIUM 9.5 10/15/2020   Lab Results  Component Value Date   CHOL 135 10/15/2020   Lab Results  Component Value Date   HDL 48 10/15/2020   Lab Results  Component Value Date   LDLCALC 71 10/15/2020   Lab Results  Component Value Date   TRIG 84 10/15/2020   Lab Results  Component Value Date   CHOLHDL 2.8 10/15/2020   Lab Results  Component Value Date   HGBA1C 5.7 (H) 10/15/2020       Assessment & Plan:   Problem List Items Addressed This Visit   None   Visit Diagnoses    Acute midline low back pain without sciatica    -  Primary   Relevant Medications   meloxicam (MOBIC) 7.5 MG tablet       Meds ordered this encounter  Medications  . meloxicam (MOBIC) 7.5 MG tablet    Sig: Take 1 tablet (7.5 mg total) by mouth daily.    Dispense:  30 tablet    Refill:  0    Order Specific Question:   Supervising Provider    Answer:   Carlota Raspberry, JEFFREY R [[id number]]   PLAN  nonfocal neuro exam.   Pt does report that dizziness has been ongoing, unclear if this is worse than before or a coincidence, but will order CT head to rule out hematoma / mass effect / other acute abnormality.   meloxicam for pain - take once daily as needed  Will consider referral to Neuro based on CT findings.  Patient  encouraged to call clinic with any questions, comments, or concerns.   Maximiano Coss, NP

## 2020-10-22 NOTE — Patient Instructions (Signed)

## 2020-10-23 ENCOUNTER — Ambulatory Visit (HOSPITAL_BASED_OUTPATIENT_CLINIC_OR_DEPARTMENT_OTHER)
Admission: RE | Admit: 2020-10-23 | Discharge: 2020-10-23 | Disposition: A | Discharge status: Home / Self Care | Payer: Medicare Other | Source: Ambulatory Visit | Attending: Registered Nurse | Admitting: Registered Nurse

## 2020-10-23 ENCOUNTER — Encounter: Payer: Self-pay | Admitting: Registered Nurse

## 2020-10-23 DIAGNOSIS — G44319 Acute post-traumatic headache, not intractable: Secondary | ICD-10-CM | POA: Diagnosis not present

## 2020-10-23 DIAGNOSIS — R42 Dizziness and giddiness: Secondary | ICD-10-CM | POA: Diagnosis not present

## 2020-10-23 DIAGNOSIS — W19XXXD Unspecified fall, subsequent encounter: Secondary | ICD-10-CM | POA: Insufficient documentation

## 2020-10-23 DIAGNOSIS — S0990XA Unspecified injury of head, initial encounter: Secondary | ICD-10-CM | POA: Diagnosis not present

## 2020-10-30 ENCOUNTER — Ambulatory Visit (INDEPENDENT_AMBULATORY_CARE_PROVIDER_SITE_OTHER): Payer: Medicare Other | Admitting: Family Medicine

## 2020-10-30 ENCOUNTER — Encounter: Payer: Self-pay | Admitting: Family Medicine

## 2020-10-30 VITALS — BP 130/82 | HR 84 | Ht 66.0 in | Wt 159.0 lb

## 2020-10-30 DIAGNOSIS — G44229 Chronic tension-type headache, not intractable: Secondary | ICD-10-CM

## 2020-10-30 MED ORDER — PROPRANOLOL HCL 20 MG PO TABS: 20.0000 mg | ORAL_TABLET | Freq: Two times a day (BID) | ORAL | 6 refills | Status: DC

## 2020-10-30 NOTE — Progress Notes (Addendum)
Chief Complaint  Patient presents with  . Follow-up    RM 2 alone PT states headaches have increased and consistent. Have them daily     HISTORY OF PRESENT ILLNESS: 10/30/20 ALL:  Aaron [name] is a 70 y.o. male here today for follow up for headaches. He was seen by Dr Jaynee Eagles  07/2019 and 09/2019. MRI was normal. Headaches were thought to be from clenching. He had used mouth guard and flexeril which helped. Sleep study also recommended but was not pursued.  He had a CT with PCP 10/2020 following an accident at home where he hit his head. CT was normal. He reports that headaches continue. Headache is constant. Pain is usually on the top of his head. No migraine symptoms. Relieved when he pushes on temples. His wife says he does not snore. He has lost 40 pounds. He takes meloxicam daily for back pain. He has taken Excedrin PM in the past but not since starting meloxicam. He has not been on prevention medications. He is followed by PCP for intermittent dizziness. Previous cardiology workup normal.    HISTORY (copied from Dr Cathren Laine previous note)  Interval history 07/11/2020: He saw the dentist and he is clenching and grinding at night, he is going to get a mouthguard. No changes since he has been seen. He is still waking up with the headache. Will add Flexeril at bedtime for teeth clenching. We discussed a plan.  HPI:  Aaron [name] is a 70 y.o. male here as requested by [name], * for persistent headache as well as headaches and myalgias.  I reviewed Dr. Barry Brunner notes.  Patient has muscle pain that started 2 weeks prior to last appointment which was May 29, 2019.  He has a past medical history of diabetes on Metformin and glipizide, hypertension on Norvasc 5 mg, hyperlipidemia on a statin.  Symptoms included upper and lower extremity muscle pain, no injuries, and "stress headaches" 3-4 times weekly this started in January.  No history of migraine headaches.  No other  associated symptoms, no abdominal pain, chest pain, fever, nausea, rash, shortness of breath or vomiting, coughing, dizziness, fever.  Examination was normal including physical examination and neurologic exam was nonfocal.  CBC 1 month ago showed elevated white blood cells.  Hemoglobin A1c 6.5-month ago, 6 months ago was 8.7.  CMP unremarkable.  He is here alone. One day a few months ago he had a headache. It was consistent, about March or April. He went to urgent care, neg for covid19, he was started on fioricet and it did not help. No hx of migraines. Excedrin did not help. Feels like he is wearing a hat that is on tight. It comes and goes. He notices it in the morning it gets better when he gets to work. Or with laying in bed. No throbbing. He rarely had headaches in the past in the temples but this is different. Can last for hours. Hasn't progressed or improved. Coffee maybe helps. Unknown triggers. Annoying not excruciating. No vision problems or vision changes. No hearing changes. He gets muscle aches in the arms, no weakness, started prior to the headache, he has some cervical neck pain, no radicular symptoms, he has low back pain that is long-term and stable. He has some numbness and tingling in his feet but not frequently. He denies snoring or excessive fatigue during the day, he doesn't complain about not feeling refreshed. No associated light or sound sensitivity, no migraines. He stopped  the statin in September still symptomatic. No fevers. Some stiffness in the neck but no deficits of range of motion.   Reviewed notes, labs and imaging from outside physicians, which showed:   Reviewed XR cervical spine images and agree: INGS: Vertebral body height and alignment are maintained. Loss of disc space height and endplate spurring are seen at C5-6. The C7-T1 level is not visualized has no swimmer's view is provided.  IMPRESSION: C5-6 degenerative disc disease.   REVIEW OF SYSTEMS: Out of a  complete 14 system review of symptoms, the patient complains only of the following symptoms, headaches, dizziness and all other reviewed systems are negative.    ALLERGIES: Allergies  Allergen Reactions  . Erythromycin Itching  . Levaquin [Levofloxacin In D5w]     Light sensitivity      HOME MEDICATIONS: Outpatient Medications Prior to Visit  Medication Sig Dispense Refill  . amLODipine (NORVASC) 2.5 MG tablet Take 1 tablet (2.5 mg total) by mouth daily. 90 tablet 3  . clotrimazole-betamethasone (LOTRISONE) cream Apply 1 application topically 2 (two) times daily. 30 g 1  . fluocinolone (VANOS) 0.01 % cream Apply topically 2 (two) times daily. 30 g 3  . glipiZIDE (GLUCOTROL) 5 MG tablet TAKE 1 TABLET(5 MG) BY MOUTH DAILY BEFORE BREAKFAST 90 tablet 1  . melatonin 5 MG TABS Take 5 mg by mouth.    . meloxicam (MOBIC) 7.5 MG tablet Take 1 tablet (7.5 mg total) by mouth daily. 30 tablet 0  . metFORMIN (GLUCOPHAGE-XR) 500 MG 24 hr tablet TAKE 1 TABLET BY MOUTH DAILY AT 8 PM 180 tablet 0  . Multiple Vitamin (MULTI-VITAMIN PO) Take by mouth daily.    . rosuvastatin (CRESTOR) 10 MG tablet Take 1 tablet (10 mg total) by mouth daily. 90 tablet 3  . vitamin B-12 (CYANOCOBALAMIN) 500 MCG tablet Take 500 mcg by mouth daily.    Marland Kitchen nystatin (MYCOSTATIN/NYSTOP) powder Apply 1 application topically 3 (three) times daily. 15 g 0   No facility-administered medications prior to visit.     PAST MEDICAL HISTORY: Past Medical History:  Diagnosis Date  . Diabetes mellitus without complication ([place])   . GERD (gastroesophageal reflux disease)   . Hyperlipidemia   . Hypertension   . Psoriasis      PAST SURGICAL HISTORY: History reviewed. No pertinent surgical history.   FAMILY HISTORY: Family History  Problem Relation Age of Onset  . Cancer Mother        breast ca spread to bone  . Cancer Father 66       brain cancer  . Migraines Neg Hx   . Headache Neg Hx      SOCIAL HISTORY: Social  History   Socioeconomic History  . Marital status: Married    Spouse name: Not on file  . Number of children: 0  . Years of education: Not on file  . Highest education level: High school graduate  Occupational History  . Occupation: Press photographer, retired   Tobacco Use  . Smoking status: Former Smoker    Types: Cigarettes    Quit date: 1985    Years since quitting: 37.1  . Smokeless tobacco: Never Used  Vaping Use  . Vaping Use: Never used  Substance and Sexual Activity  . Alcohol use: Yes    Alcohol/week: 7.0 standard drinks    Types: 7 Cans of beer per week    Comment: 1 beer with dinner usually daily  . Drug use: No  . Sexual activity: Yes  Other  Topics Concern  . Not on file  Social History Narrative   Marital status:  Married.             Children:   none      Lives:      Employment:  Press photographer      Tobacco:  none      Alcohol:  About 7 drinks per week      Drugs:   none      Exercise:      Seatbelt:      Education: [employer].      Lives at home with wife   Right handed   Caffeine: 2 cups/day   Social Determinants of Health   Financial Resource Strain: Not on file  Food Insecurity: Not on file  Transportation Needs: Not on file  Physical Activity: Not on file  Stress: Not on file  Social Connections: Not on file  Intimate Partner Violence: Not on file      PHYSICAL EXAM  Vitals:   10/30/20 1118  BP: 130/82  Pulse: 84  Weight: 159 lb (72.1 kg)  Height: 5\' 6"  (1.676 m)   Body mass index is 25.66 kg/m.   Generalized: Well developed, in no acute distress  Cardiology: normal rate and rhythm, no murmur auscultated  Respiratory: clear to auscultation bilaterally    Neurological examination  Mentation: Alert oriented to time, place, history taking. Follows all commands speech and language fluent Cranial nerve II-XII: Pupils were equal round reactive to light. Extraocular movements were full, visual field were full on confrontational test. Facial  sensation and strength were normal. Head turning and shoulder shrug  were normal and symmetric. Motor: The motor testing reveals 5 over 5 strength of all 4 extremities. Good symmetric motor tone is noted throughout.  Sensory: Sensory testing is intact to soft touch on all 4 extremities. No evidence of extinction is noted.  Coordination: Cerebellar testing reveals good finger-nose-finger and heel-to-shin bilaterally.  Gait and station: Gait is normal.  Reflexes: Deep tendon reflexes are symmetric and normal bilaterally.     DIAGNOSTIC DATA (LABS, IMAGING, TESTING) - I reviewed patient records, labs, notes, testing and imaging myself where available.  Lab Results  Component Value Date   WBC 6.6 05/29/2020   HGB 15.7 05/29/2020   HCT 48.8 05/29/2020   MCV 84.0 05/29/2020   PLT 284 05/29/2020      Component Value Date/Time   NA 141 10/15/2020 1325   K 4.1 10/15/2020 1325   CL 102 10/15/2020 1325   CO2 22 10/15/2020 1325   GLUCOSE 135 (H) 10/15/2020 1325   GLUCOSE 147 (H) 04/29/2015 1339   BUN 13 10/15/2020 1325   CREATININE 0.67 (L) 10/15/2020 1325   CREATININE 0.71 04/29/2015 1339   CALCIUM 9.5 10/15/2020 1325   PROT 6.8 10/15/2020 1325   ALBUMIN 4.2 10/15/2020 1325   AST 21 10/15/2020 1325   ALT 17 10/15/2020 1325   ALKPHOS 77 10/15/2020 1325   BILITOT 0.4 10/15/2020 1325   GFRNONAA 99 10/15/2020 1325   GFRNONAA >89 04/29/2015 1339   GFRAA 115 10/15/2020 1325   GFRAA >89 04/29/2015 1339   Lab Results  Component Value Date   CHOL 135 10/15/2020   HDL 48 10/15/2020   LDLCALC 71 10/15/2020   TRIG 84 10/15/2020   CHOLHDL 2.8 10/15/2020   Lab Results  Component Value Date   HGBA1C 5.7 (H) 10/15/2020   Lab Results  Component Value Date   VITAMINB12 331 06/12/2020  Lab Results  Component Value Date   TSH 2.750 04/30/2020    No flowsheet data found.   No flowsheet data found.   ASSESSMENT AND PLAN  70 y.o. year old male  has a past medical history of  Diabetes mellitus without complication ([place]), GERD (gastroesophageal reflux disease), Hyperlipidemia, Hypertension, and Psoriasis. here with   Chronic tension-type headache, not intractable  R[place] continues to have chronic tension style headaches. We will start propranolol 20mg  at bedtime for 2 weeks. If well tolerated we will increase dose to 20mg  twice daily. He may use Excedrin as needed for abortive therapy. Healthy lifestyle habits encouraged. He will continue close follow up with PCP for dizziness. He will follow up with me in 3-6 months.   No orders of the defined types were placed in this encounter.    Meds ordered this encounter  Medications  . propranolol (INDERAL) 20 MG tablet    Sig: Take 1 tablet (20 mg total) by mouth 2 (two) times daily.    Dispense:  60 tablet    Refill:  6    Order Specific Question:   Supervising Provider    Answer:   Melvenia Beam [id number]      I spent 20 minutes of face-to-face and non-face-to-face time with patient.  This included previsit chart review, lab review, study review, order entry, electronic health record documentation, patient education.    Debbora Presto, MSN, FNP-C 10/30/2020, 12:50 PM  [facility] [address] [place], [place] [address] [phone]  Made any corrections needed, and agree with history, physical, neuro exam,assessment and plan as stated.     Sarina Ill, MD [facility]

## 2020-10-30 NOTE — Patient Instructions (Addendum)
Below is our plan:  We will try a low daily dose of propranolol. Start with 20mg  daily at bedtime. If you do well, we can increase dose to 20mg  twice daily after two weeks.    Please make sure you are staying well hydrated. I recommend 50-60 ounces daily. Well balanced diet and regular exercise encouraged. Consistent sleep schedule with 6-8 hours recommended.   Please continue follow up with care team as directed.   Follow up with me in 3-6 months   You may receive a survey regarding today's visit. I encourage you to leave honest feed back as I do use this information to improve patient care. Thank you for seeing me today!   Propranolol Tablets What is this medicine? PROPRANOLOL (proe PRAN oh lole) is a beta blocker. It decreases the amount of work your heart has to do and helps your heart beat regularly. It treats high blood pressure and/or prevent chest pain (also called angina). It is also used after a heart attack to prevent a second one. This medicine may be used for other purposes; ask your health care provider or pharmacist if you have questions. COMMON BRAND NAME(S): Inderal What should I tell my health care provider before I take this medicine? They need to know if you have any of these conditions:  circulation problems or blood vessel disease  diabetes  history of heart attack or heart disease, vasospastic angina  kidney disease  liver disease  lung or breathing disease, like asthma or emphysema  pheochromocytoma  slow heart rate  thyroid disease  an unusual or allergic reaction to propranolol, other beta-blockers, medicines, foods, dyes, or preservatives  pregnant or trying to get pregnant  breast-feeding How should I use this medicine? Take this drug by mouth. Take it as directed on the prescription label at the same time every day. Keep taking it unless your health care provider tells you to stop. Talk to your health care provider about the use of this drug  in children. Special care may be needed. Overdosage: If you think you have taken too much of this medicine contact a poison control center or emergency room at once. NOTE: This medicine is only for you. Do not share this medicine with others. What if I miss a dose? If you miss a dose, take it as soon as you can. If it is almost time for your next dose, take only that dose. Do not take double or extra doses. What may interact with this medicine? Do not take this medicine with any of the following medications:  feverfew  phenothiazines like chlorpromazine, mesoridazine, prochlorperazine, thioridazine This medicine may also interact with the following medications:  aluminum hydroxide gel  antipyrine  antiviral medicines for HIV or AIDS  barbiturates like phenobarbital  certain medicines for blood pressure, heart disease, irregular heart beat  cimetidine  ciprofloxacin  diazepam  fluconazole  haloperidol  isoniazid  medicines for cholesterol like cholestyramine or colestipol  medicines for mental depression  medicines for migraine headache like almotriptan, eletriptan, frovatriptan, naratriptan, rizatriptan, sumatriptan, zolmitriptan  NSAIDs, medicines for pain and inflammation, like ibuprofen or naproxen  phenytoin  rifampin  teniposide  theophylline  thyroid medicines  tolbutamide  warfarin  zileuton This list may not describe all possible interactions. Give your health care provider a list of all the medicines, herbs, non-prescription drugs, or dietary supplements you use. Also tell them if you smoke, drink alcohol, or use illegal drugs. Some items may interact with your medicine. What should  I watch for while using this medicine? Visit your doctor or health care professional for regular check ups. Check your blood pressure and pulse rate regularly. Ask your health care professional what your blood pressure and pulse rate should be, and when you should  contact them. You may get drowsy or dizzy. Do not drive, use machinery, or do anything that needs mental alertness until you know how this drug affects you. Do not stand or sit up quickly, especially if you are an older patient. This reduces the risk of dizzy or fainting spells. Alcohol can make you more drowsy and dizzy. Avoid alcoholic drinks. This medicine may increase blood sugar. Ask your healthcare provider if changes in diet or medicines are needed if you have diabetes. Do not treat yourself for coughs, colds, or pain while you are taking this medicine without asking your doctor or health care professional for advice. Some ingredients may increase your blood pressure. What side effects may I notice from receiving this medicine? Side effects that you should report to your doctor or health care professional as soon as possible:  allergic reactions like skin rash, itching or hives, swelling of the face, lips, or tongue  breathing problems  cold hands or feet  difficulty sleeping, nightmares  dry peeling skin  hallucinations  muscle cramps or weakness  signs and symptoms of high blood sugar such as being more thirsty or hungry or having to urinate more than normal. You may also feel very tired or have blurry vision.  slow heart rate  swelling of the legs and ankles  vomiting Side effects that usually do not require medical attention (report to your doctor or health care professional if they continue or are bothersome):  change in sex drive or performance  diarrhea  dry sore eyes  hair loss  nausea  weak or tired This list may not describe all possible side effects. Call your doctor for medical advice about side effects. You may report side effects to FDA at 1-800-FDA-1088. Where should I keep my medicine? Keep out of the reach of children and pets. Store at room temperature between 20 and 25 degrees C (68 and 77 degrees F). Protect from light. Throw away any unused drug  after the expiration date. NOTE: This sheet is a summary. It may not cover all possible information. If you have questions about this medicine, talk to your doctor, pharmacist, or health care provider.  2021 Elsevier/Gold Standard (2019-03-24 19:25:51)    Tension Headache, Adult A tension headache is a feeling of pain, pressure, or aching in the head. It is often felt over the front and sides of the head. Tension headaches can last from 30 minutes to several days. What are the causes? The cause of this condition is not known. Sometimes, tension headaches are brought on by stress, worry (anxiety), or depression. Other things that may set them off include:  Alcohol.  Too much caffeine or caffeine withdrawal.  Colds, flu, or sinus infections.  Dental problems. This can include clenching your teeth.  Being tired.  Holding your head and neck in the same position for a long time, such as while using a computer.  Smoking.  Arthritis in the neck. What are the signs or symptoms?  Feeling pressure around the head.  A dull ache in the head.  Pain over the front and sides of the head.  Feeling sore or tender in the muscles of the head, neck, and shoulders. How is this treated? This condition may  be treated with lifestyle changes and with medicines that help relieve symptoms. Follow these instructions at home: Managing pain  Take over-the-counter and prescription medicines only as told by your doctor.  When you have a headache, lie down in a dark, quiet room.  If told, put ice on your head and neck. To do this: ? Put ice in a plastic bag. ? Place a towel between your skin and the bag. ? Leave the ice on for 20 minutes, 2-3 times a day. ? Take off the ice if your skin turns bright red. This is very important. If you cannot feel pain, heat, or cold, you have a greater risk of damage to the area.  If told, put heat on the back of your neck. Do this as often as told by your doctor.  Use the heat source that your doctor recommends, such as a moist heat pack or a heating pad. ? Place a towel between your skin and the heat source. ? Leave the heat on for 20-30 minutes. ? Take off the heat if your skin turns bright red. This is very important. If you cannot feel pain, heat, or cold, you have a greater risk of getting burned. Eating and drinking  Eat meals on a regular schedule.  If you drink alcohol: ? Limit how much you have to:  0-1 drink a day for women who are not pregnant.  0-2 drinks a day for men. ? Know how much alcohol is in your drink. In the [place] one drink equals one 12 oz bottle of beer (355 mL), one 5 oz glass of wine (148 mL), or one 1 oz glass of hard liquor (44 mL).  Drink enough fluid to keep your pee (urine) pale yellow.  Do not use a lot of caffeine, or stop using caffeine. Lifestyle  Get 7-9 hours of sleep each night. Or get the amount of sleep that your doctor tells you to.  At bedtime, keep computers, phones, and tablets out of your room.  Find ways to lessen your stress. This may include: ? Exercise. ? Deep breathing. ? Yoga. ? Listening to music. ? Thinking positive thoughts.  Sit up straight. Try to relax your muscles.  Do not smoke or use any products that contain nicotine or tobacco. If you need help quitting, ask your doctor. General instructions  Avoid things that can bring on headaches. Keep a headache journal to see what may bring on headaches. For example, write down: ? What you eat and drink. ? How much sleep you get. ? Any change to your diet or medicines.  Keep all follow-up visits.   Contact a doctor if:  Your headache does not get better.  Your headache comes back.  You have a headache, and sounds, light, or smells bother you.  You feel like you may vomit, or you vomit.  Your stomach hurts. Get help right away if:  You all of a sudden get a very bad headache with any of these things: ? A stiff  neck. ? Feeling like you may vomit. ? Vomiting. ? Feeling mixed up (confused). ? Feeling weak in one part or one side of your body. ? Having trouble seeing or speaking, or both. ? Feeling short of breath. ? A rash. ? Feeling very sleepy. ? Pain in your eye or ear. ? Trouble walking or balancing. ? Feeling like you will faint, or you faint. Summary  A tension headache is pain, pressure, or aching in your head.  Tension headaches can last from 30 minutes to several days.  Lifestyle changes and medicines may help relieve pain. This information is not intended to replace advice given to you by your health care provider. Make sure you discuss any questions you have with your health care provider. Document Revised: 05/16/2020 Document Reviewed: 05/16/2020 Elsevier Patient Education  2021 [employer].

## 2020-10-31 ENCOUNTER — Encounter: Payer: Self-pay | Admitting: Emergency Medicine

## 2020-10-31 ENCOUNTER — Encounter: Payer: Self-pay | Admitting: Family Medicine

## 2020-11-06 DIAGNOSIS — H43812 Vitreous degeneration, left eye: Secondary | ICD-10-CM | POA: Diagnosis not present

## 2020-11-12 ENCOUNTER — Other Ambulatory Visit: Payer: Self-pay

## 2020-11-12 ENCOUNTER — Ambulatory Visit (INDEPENDENT_AMBULATORY_CARE_PROVIDER_SITE_OTHER): Payer: Medicare Other | Admitting: Emergency Medicine

## 2020-11-12 ENCOUNTER — Encounter: Payer: Self-pay | Admitting: Emergency Medicine

## 2020-11-12 VITALS — BP 125/71 | HR 71 | Temp 97.6°F | Resp 16 | Ht 66.0 in | Wt 158.0 lb

## 2020-11-12 DIAGNOSIS — R5383 Other fatigue: Secondary | ICD-10-CM

## 2020-11-12 DIAGNOSIS — E1159 Type 2 diabetes mellitus with other circulatory complications: Secondary | ICD-10-CM | POA: Diagnosis not present

## 2020-11-12 DIAGNOSIS — G4700 Insomnia, unspecified: Secondary | ICD-10-CM

## 2020-11-12 DIAGNOSIS — I152 Hypertension secondary to endocrine disorders: Secondary | ICD-10-CM | POA: Diagnosis not present

## 2020-11-12 DIAGNOSIS — E785 Hyperlipidemia, unspecified: Secondary | ICD-10-CM | POA: Diagnosis not present

## 2020-11-12 DIAGNOSIS — R21 Rash and other nonspecific skin eruption: Secondary | ICD-10-CM | POA: Diagnosis not present

## 2020-11-12 DIAGNOSIS — E1169 Type 2 diabetes mellitus with other specified complication: Secondary | ICD-10-CM | POA: Diagnosis not present

## 2020-11-12 MED ORDER — ZOLPIDEM TARTRATE 5 MG PO TABS: 5.0000 mg | ORAL_TABLET | Freq: Every evening | ORAL | 2 refills | Status: DC | PRN

## 2020-11-12 MED ORDER — TRIAMCINOLONE ACETONIDE 0.1 % EX CREA: 1.0000 "application " | TOPICAL_CREAM | Freq: Two times a day (BID) | CUTANEOUS | 3 refills | Status: DC

## 2020-11-12 NOTE — Patient Instructions (Addendum)
If you have lab work done today you will be contacted with your lab results within the next 2 weeks.  If you have not heard from [place] then please contact us. The fastest way to get your results is to register for My Chart.   IF you received an x-ray today, you will receive an invoice from [facility]. Please contact [facility] at [phone] with questions or concerns regarding your invoice.   IF you received labwork today, you will receive an invoice from [place]. Please contact [facility] at [phone] with questions or concerns regarding your invoice.   Our billing staff will not be able to assist you with questions regarding bills from these companies.  You will be contacted with the lab results as soon as they are available. The fastest way to get your results is to activate your My Chart account. Instructions are located on the last page of this paperwork. If you have not heard from [place] regarding the results in 2 weeks, please contact this office.     Insomnia Insomnia is a sleep disorder that makes it difficult to fall asleep or stay asleep. Insomnia can cause fatigue, low energy, difficulty concentrating, mood swings, and poor performance at work or school. There are three different ways to classify insomnia:  Difficulty falling asleep.  Difficulty staying asleep.  Waking up too early in the morning. Any type of insomnia can be long-term (chronic) or short-term (acute). Both are common. Short-term insomnia usually lasts for three months or less. Chronic insomnia occurs at least three times a week for longer than three months. What are the causes? Insomnia may be caused by another condition, situation, or substance, such as:  Anxiety.  Certain medicines.  Gastroesophageal reflux disease (GERD) or other gastrointestinal conditions.  Asthma or other breathing conditions.  Restless legs syndrome, sleep apnea, or other sleep disorders.  Chronic  pain.  Menopause.  Stroke.  Abuse of alcohol, tobacco, or illegal drugs.  Mental health conditions, such as depression.  Caffeine.  Neurological disorders, such as Alzheimer's disease.  An overactive thyroid (hyperthyroidism). Sometimes, the cause of insomnia may not be known. What increases the risk? Risk factors for insomnia include:  Gender. Women are affected more often than men.  Age. Insomnia is more common as you get older.  Stress.  Lack of exercise.  Irregular work schedule or working night shifts.  Traveling between different time zones.  Certain medical and mental health conditions. What are the signs or symptoms? If you have insomnia, the main symptom is having trouble falling asleep or having trouble staying asleep. This may lead to other symptoms, such as:  Feeling fatigued or having low energy.  Feeling nervous about going to sleep.  Not feeling rested in the morning.  Having trouble concentrating.  Feeling irritable, anxious, or depressed. How is this diagnosed? This condition may be diagnosed based on:  Your symptoms and medical history. Your health care provider may ask about: ? Your sleep habits. ? Any medical conditions you have. ? Your mental health.  A physical exam. How is this treated? Treatment for insomnia depends on the cause. Treatment may focus on treating an underlying condition that is causing insomnia. Treatment may also include:  Medicines to help you sleep.  Counseling or therapy.  Lifestyle adjustments to help you sleep better. Follow these instructions at home: Eating and drinking  Limit or avoid alcohol, caffeinated beverages, and cigarettes, especially close to bedtime. These can disrupt your sleep.  Do  not eat a large meal or eat spicy foods right before bedtime. This can lead to digestive discomfort that can make it hard for you to sleep.   Sleep habits  Keep a sleep diary to help you and your health care  provider figure out what could be causing your insomnia. Write down: ? When you sleep. ? When you wake up during the night. ? How well you sleep. ? How rested you feel the next day. ? Any side effects of medicines you are taking. ? What you eat and drink.  Make your bedroom a dark, comfortable place where it is easy to fall asleep. ? Put up shades or blackout curtains to block light from outside. ? Use a white noise machine to block noise. ? Keep the temperature cool.  Limit screen use before bedtime. This includes: ? Watching TV. ? Using your smartphone, tablet, or computer.  Stick to a routine that includes going to bed and waking up at the same times every day and night. This can help you fall asleep faster. Consider making a quiet activity, such as reading, part of your nighttime routine.  Try to avoid taking naps during the day so that you sleep better at night.  Get out of bed if you are still awake after 15 minutes of trying to sleep. Keep the lights down, but try reading or doing a quiet activity. When you feel sleepy, go back to bed.   General instructions  Take over-the-counter and prescription medicines only as told by your health care provider.  Exercise regularly, as told by your health care provider. Avoid exercise starting several hours before bedtime.  Use relaxation techniques to manage stress. Ask your health care provider to suggest some techniques that may work well for you. These may include: ? Breathing exercises. ? Routines to release muscle tension. ? Visualizing peaceful scenes.  Make sure that you drive carefully. Avoid driving if you feel very sleepy.  Keep all follow-up visits as told by your health care provider. This is important. Contact a health care provider if:  You are tired throughout the day.  You have trouble in your daily routine due to sleepiness.  You continue to have sleep problems, or your sleep problems get worse. Get help right  away if:  You have serious thoughts about hurting yourself or someone else. If you ever feel like you may hurt yourself or others, or have thoughts about taking your own life, get help right away. You can go to your nearest emergency department or call:  Your local emergency services (911 in the [place]  A suicide crisis helpline, such as the [place] [facility] at (828) 287-5993. This is open 24 hours a day. Summary  Insomnia is a sleep disorder that makes it difficult to fall asleep or stay asleep.  Insomnia can be long-term (chronic) or short-term (acute).  Treatment for insomnia depends on the cause. Treatment may focus on treating an underlying condition that is causing insomnia.  Keep a sleep diary to help you and your health care provider figure out what could be causing your insomnia. This information is not intended to replace advice given to you by your health care provider. Make sure you discuss any questions you have with your health care provider. Document Revised: 06/27/2020 Document Reviewed: 06/27/2020 Elsevier Patient Education  2021 [employer].

## 2020-11-12 NOTE — Progress Notes (Signed)
Aaron [name] 70 y.o.   Chief Complaint  Patient presents with  . Fatigue    Per patient he continues to feel fatigue. Also, he saw the headache specialist and this a follow up  . Medication Refill    Patient wants a restart Triamcinolone Acetonide Cream 0.1%     HISTORY OF PRESENT ILLNESS: This is a 70 y.o. male complaining of feeling tired all the time for the past 2 months. Has had multiple evaluations and work-up without significant findings. Not sleeping well.  Has trouble falling asleep and when he does wakes up after 1 to 2 hours. Wakes up tired and stays tired all day.  Denies snoring or episodes of apnea as per wife. Has history of diabetes on glipizide and Metformin. Blood pressure readings have been normal so he has not been taking amlodipine 2.5 mg. Has history of dyslipidemia on rosuvastatin 10 mg daily. Recently started on propranolol 20 mg at bedtime for chronic headaches.  Seem to be helping a little bit. Reports increased worries and stresses at home. Non-smoker.  Drinks 1 beer 3-4 times a week. No other complaints or medical concerns today. Rheumatology evaluation on 10/01/2020 as follows: Lab results are all negative for evidence of a systemic inflammatory disease as underlying cause of his peripheral circulation or sensation problem. This does not exclude toxic exposure related symptoms but these would not need specialty medication treatment with our clinic so recommend he follow up with his PCP or vascular doctor regarding if they think testing such as lead, arsenic, etc needed. Neurology office visit on 10/30/2020 as follows: ASSESSMENT AND PLAN  70 y.o. year old male  has a past medical history of Diabetes mellitus without complication (Aaron [name]), GERD (gastroesophageal reflux disease), Hyperlipidemia, Hypertension, and Psoriasis. here with   Chronic tension-type headache, not intractable  [place] continues to have chronic tension style headaches. We will start  propranolol 20mg  at bedtime for 2 weeks. If well tolerated we will increase dose to 20mg  twice daily. He may use Excedrin as needed for abortive therapy. Healthy lifestyle habits encouraged. He will continue close follow up with PCP for dizziness. He will follow up with me in 3-6 months.   No orders of the defined types were placed in this encounter. HPI   Prior to Admission medications   Medication Sig Start Date End Date Taking? Authorizing Provider  amLODipine (NORVASC) 2.5 MG tablet Take 1 tablet (2.5 mg total) by mouth daily. 07/05/20  Yes Minus Breeding, MD  clotrimazole-betamethasone (LOTRISONE) cream Apply 1 application topically 2 (two) times daily. 09/16/20  Yes Icey Tello, Ines Bloomer, MD  fluocinolone (VANOS) 0.01 % cream Apply topically 2 (two) times daily. 02/05/20  Yes Consuello Lassalle, Ines Bloomer, MD  glipiZIDE (GLUCOTROL) 5 MG tablet TAKE 1 TABLET(5 MG) BY MOUTH DAILY BEFORE BREAKFAST 03/05/20  Yes Frannie Shedrick, Ines Bloomer, MD  melatonin 5 MG TABS Take 5 mg by mouth.   Yes [provider]  metFORMIN (GLUCOPHAGE-XR) 500 MG 24 hr tablet TAKE 1 TABLET BY MOUTH DAILY AT 8 PM 03/18/20  Yes Tarquin Welcher, Ines Bloomer, MD  Multiple Vitamin (MULTI-VITAMIN PO) Take by mouth daily.   Yes [provider]  propranolol (INDERAL) 20 MG tablet Take 1 tablet (20 mg total) by mouth 2 (two) times daily. 10/30/20  Yes Lomax, Amy, NP  rosuvastatin (CRESTOR) 10 MG tablet Take 1 tablet (10 mg total) by mouth daily. 06/12/20  Yes Darrion Wyszynski, Ines Bloomer, MD  vitamin B-12 (CYANOCOBALAMIN) 500 MCG tablet Take 500 mcg by mouth  daily.   Yes [provider]  meloxicam (MOBIC) 7.5 MG tablet Take 1 tablet (7.5 mg total) by mouth daily. Patient not taking: Reported on 11/12/2020 10/22/20   Maximiano Coss, NP    Allergies  Allergen Reactions  . Erythromycin Itching  . Levaquin [Levofloxacin In D5w]     Light sensitivity     Patient Active Problem List   Diagnosis Date Noted  . Cold finger without  peripheral vascular disease 10/01/2020  . Other psoriasis 10/01/2020  . Coronary artery calcification 06/12/2020  . History of palpitations 06/12/2020  . Tingling of both feet 06/12/2020  . Erythrocytosis 05/08/2020  . Weight loss, non-intentional 05/08/2020  . Night sweats 05/08/2020  . Hypertension associated with diabetes ([place]) 05/29/2019  . Gastroesophageal reflux disease without esophagitis 03/13/2017  . Dyslipidemia associated with type 2 diabetes mellitus ([place]) 03/13/2017    Past Medical History:  Diagnosis Date  . Diabetes mellitus without complication ([place])   . GERD (gastroesophageal reflux disease)   . Hyperlipidemia   . Hypertension   . Psoriasis     No past surgical history on file.  Social History   Socioeconomic History  . Marital status: Married    Spouse name: Not on file  . Number of children: 0  . Years of education: Not on file  . Highest education level: High school graduate  Occupational History  . Occupation: Press photographer, retired   Tobacco Use  . Smoking status: Former Smoker    Types: Cigarettes    Quit date: 1985    Years since quitting: 37.2  . Smokeless tobacco: Never Used  Vaping Use  . Vaping Use: Never used  Substance and Sexual Activity  . Alcohol use: Yes    Alcohol/week: 7.0 standard drinks    Types: 7 Cans of beer per week    Comment: 1 beer with dinner usually daily  . Drug use: No  . Sexual activity: Yes  Other Topics Concern  . Not on file  Social History Narrative   Marital status:  Married.             Children:   none      Lives:      Employment:  Press photographer      Tobacco:  none      Alcohol:  About 7 drinks per week      Drugs:   none      Exercise:      Seatbelt:      Education: [employer].      Lives at home with wife   Right handed   Caffeine: 2 cups/day   Social Determinants of Health   Financial Resource Strain: Not on file  Food Insecurity: Not on file  Transportation Needs: Not on file  Physical Activity:  Not on file  Stress: Not on file  Social Connections: Not on file  Intimate Partner Violence: Not on file    Family History  Problem Relation Age of Onset  . Cancer Mother        breast ca spread to bone  . Cancer Father 11       brain cancer  . Migraines Neg Hx   . Headache Neg Hx      Review of Systems  Constitutional: Positive for malaise/fatigue. Negative for chills and fever.  HENT: Negative.  Negative for congestion and sore throat.   Respiratory: Negative.  Negative for cough and shortness of breath.   Cardiovascular: Negative.  Negative for chest pain and  palpitations.  Gastrointestinal: Negative.  Negative for abdominal pain, diarrhea, melena, nausea and vomiting.  Genitourinary: Negative.  Negative for dysuria and hematuria.  Skin: Positive for rash (Left elbow area).  Neurological: Positive for weakness.  Psychiatric/Behavioral: The patient has insomnia.   All other systems reviewed and are negative.   Today's Vitals   11/12/20 1124  BP: 125/71  Pulse: 71  Resp: 16  Temp: 97.6 F (36.4 C)  TempSrc: Temporal  SpO2: 98%  Weight: 158 lb (71.7 kg)  Height: 5\' 6"  (1.676 m)   Body mass index is 25.5 kg/m. Wt Readings from Last 3 Encounters:  11/12/20 158 lb (71.7 kg)  10/30/20 159 lb (72.1 kg)  10/22/20 160 lb 12.8 oz (72.9 kg)    Physical Exam Vitals reviewed.  Constitutional:      Appearance: Normal appearance.  HENT:     Head: Normocephalic.  Eyes:     Extraocular Movements: Extraocular movements intact.     Pupils: Pupils are equal, round, and reactive to light.  Cardiovascular:     Rate and Rhythm: Normal rate and regular rhythm.  Pulmonary:     Effort: Pulmonary effort is normal.     Breath sounds: Normal breath sounds.  Musculoskeletal:        General: Normal range of motion.     Cervical back: Normal range of motion.  Skin:    General: Skin is warm and dry.     Capillary Refill: Capillary refill takes less than 2 seconds.   Neurological:     General: No focal deficit present.     Mental Status: He is alert and oriented to person, place, and time.  Psychiatric:        Mood and Affect: Mood normal.        Behavior: Behavior normal.    A total of 30 minutes was spent with the patient, greater than 50% of which was in counseling/coordination of care regarding differential diagnosis of general weakness/tiredness, review of most recent office visit notes, review of most recent neurologist's office visit notes, review of most recent rheumatologist's office visit notes, review of most recent blood work results, need to restore regular sleeping hours and patterns, need to reduce amount of stress, review of chronic medical problems including diabetes, hypertension, dyslipidemia, education on nutrition, prognosis, documentation and need for follow-up.   ASSESSMENT & PLAN: Varian was seen today for fatigue and medication refill.  Diagnoses and all orders for this visit:  Insomnia, unspecified type -     zolpidem (AMBIEN) 5 MG tablet; Take 1 tablet (5 mg total) by mouth at bedtime as needed for sleep.  Rash and nonspecific skin eruption -     triamcinolone (KENALOG) 0.1 %; Apply 1 application topically 2 (two) times daily.  Tiredness  Hypertension associated with diabetes ([place])  Dyslipidemia associated with type 2 diabetes mellitus [facility]    Patient Instructions       If you have lab work done today you will be contacted with your lab results within the next 2 weeks.  If you have not heard from [place] then please contact us. The fastest way to get your results is to register for My Chart.   IF you received an x-ray today, you will receive an invoice from [facility] Radiology. Please contact [facility] at [phone] with questions or concerns regarding your invoice.   IF you received labwork today, you will receive an invoice from [place]. Please contact [facility] at [phone] with questions or  concerns regarding  your invoice.   Our billing staff will not be able to assist you with questions regarding bills from these companies.  You will be contacted with the lab results as soon as they are available. The fastest way to get your results is to activate your My Chart account. Instructions are located on the last page of this paperwork. If you have not heard from [place] regarding the results in 2 weeks, please contact this office.     Insomnia Insomnia is a sleep disorder that makes it difficult to fall asleep or stay asleep. Insomnia can cause fatigue, low energy, difficulty concentrating, mood swings, and poor performance at work or school. There are three different ways to classify insomnia:  Difficulty falling asleep.  Difficulty staying asleep.  Waking up too early in the morning. Any type of insomnia can be long-term (chronic) or short-term (acute). Both are common. Short-term insomnia usually lasts for three months or less. Chronic insomnia occurs at least three times a week for longer than three months. What are the causes? Insomnia may be caused by another condition, situation, or substance, such as:  Anxiety.  Certain medicines.  Gastroesophageal reflux disease (GERD) or other gastrointestinal conditions.  Asthma or other breathing conditions.  Restless legs syndrome, sleep apnea, or other sleep disorders.  Chronic pain.  Menopause.  Stroke.  Abuse of alcohol, tobacco, or illegal drugs.  Mental health conditions, such as depression.  Caffeine.  Neurological disorders, such as Alzheimer's disease.  An overactive thyroid (hyperthyroidism). Sometimes, the cause of insomnia may not be known. What increases the risk? Risk factors for insomnia include:  Gender. Women are affected more often than men.  Age. Insomnia is more common as you get older.  Stress.  Lack of exercise.  Irregular work schedule or working night shifts.  Traveling between  different time zones.  Certain medical and mental health conditions. What are the signs or symptoms? If you have insomnia, the main symptom is having trouble falling asleep or having trouble staying asleep. This may lead to other symptoms, such as:  Feeling fatigued or having low energy.  Feeling nervous about going to sleep.  Not feeling rested in the morning.  Having trouble concentrating.  Feeling irritable, anxious, or depressed. How is this diagnosed? This condition may be diagnosed based on:  Your symptoms and medical history. Your health care provider may ask about: ? Your sleep habits. ? Any medical conditions you have. ? Your mental health.  A physical exam. How is this treated? Treatment for insomnia depends on the cause. Treatment may focus on treating an underlying condition that is causing insomnia. Treatment may also include:  Medicines to help you sleep.  Counseling or therapy.  Lifestyle adjustments to help you sleep better. Follow these instructions at home: Eating and drinking  Limit or avoid alcohol, caffeinated beverages, and cigarettes, especially close to bedtime. These can disrupt your sleep.  Do not eat a large meal or eat spicy foods right before bedtime. This can lead to digestive discomfort that can make it hard for you to sleep.   Sleep habits  Keep a sleep diary to help you and your health care provider figure out what could be causing your insomnia. Write down: ? When you sleep. ? When you wake up during the night. ? How well you sleep. ? How rested you feel the next day. ? Any side effects of medicines you are taking. ? What you eat and drink.  Make your bedroom a dark, comfortable  place where it is easy to fall asleep. ? Put up shades or blackout curtains to block light from outside. ? Use a white noise machine to block noise. ? Keep the temperature cool.  Limit screen use before bedtime. This includes: ? Watching TV. ? Using your  smartphone, tablet, or computer.  Stick to a routine that includes going to bed and waking up at the same times every day and night. This can help you fall asleep faster. Consider making a quiet activity, such as reading, part of your nighttime routine.  Try to avoid taking naps during the day so that you sleep better at night.  Get out of bed if you are still awake after 15 minutes of trying to sleep. Keep the lights down, but try reading or doing a quiet activity. When you feel sleepy, go back to bed.   General instructions  Take over-the-counter and prescription medicines only as told by your health care provider.  Exercise regularly, as told by your health care provider. Avoid exercise starting several hours before bedtime.  Use relaxation techniques to manage stress. Ask your health care provider to suggest some techniques that may work well for you. These may include: ? Breathing exercises. ? Routines to release muscle tension. ? Visualizing peaceful scenes.  Make sure that you drive carefully. Avoid driving if you feel very sleepy.  Keep all follow-up visits as told by your health care provider. This is important. Contact a health care provider if:  You are tired throughout the day.  You have trouble in your daily routine due to sleepiness.  You continue to have sleep problems, or your sleep problems get worse. Get help right away if:  You have serious thoughts about hurting yourself or someone else. If you ever feel like you may hurt yourself or others, or have thoughts about taking your own life, get help right away. You can go to your nearest emergency department or call:  Your local emergency services (911 in the [place]  A suicide crisis helpline, such as the [place] at 2721718213. This is open 24 hours a day. Summary  Insomnia is a sleep disorder that makes it difficult to fall asleep or stay asleep.  Insomnia can be long-term  (chronic) or short-term (acute).  Treatment for insomnia depends on the cause. Treatment may focus on treating an underlying condition that is causing insomnia.  Keep a sleep diary to help you and your health care provider figure out what could be causing your insomnia. This information is not intended to replace advice given to you by your health care provider. Make sure you discuss any questions you have with your health care provider. Document Revised: 06/27/2020 Document Reviewed: 06/27/2020 Elsevier Patient Education  2021 [employer]      Agustina Caroli, MD [facility]

## 2020-11-26 ENCOUNTER — Other Ambulatory Visit: Payer: Self-pay | Admitting: Emergency Medicine

## 2020-11-26 DIAGNOSIS — E1165 Type 2 diabetes mellitus with hyperglycemia: Secondary | ICD-10-CM

## 2020-12-06 ENCOUNTER — Encounter: Payer: Self-pay | Admitting: Emergency Medicine

## 2020-12-11 ENCOUNTER — Encounter: Payer: Self-pay | Admitting: Emergency Medicine

## 2020-12-11 ENCOUNTER — Other Ambulatory Visit: Payer: Self-pay | Admitting: Emergency Medicine

## 2020-12-11 DIAGNOSIS — G4700 Insomnia, unspecified: Secondary | ICD-10-CM

## 2020-12-11 MED ORDER — ZOLPIDEM TARTRATE 5 MG PO TABS: 5.0000 mg | ORAL_TABLET | Freq: Every evening | ORAL | 2 refills | Status: DC | PRN

## 2020-12-11 NOTE — Telephone Encounter (Signed)
Ambien prescription refilled.  Not familiar with L-theanine.  Thanks.

## 2020-12-12 NOTE — Telephone Encounter (Signed)
Please Advise

## 2020-12-16 ENCOUNTER — Encounter: Payer: Self-pay | Admitting: Family Medicine

## 2020-12-17 ENCOUNTER — Ambulatory Visit (INDEPENDENT_AMBULATORY_CARE_PROVIDER_SITE_OTHER): Payer: Medicare Other | Admitting: Emergency Medicine

## 2020-12-17 ENCOUNTER — Other Ambulatory Visit: Payer: Self-pay

## 2020-12-17 ENCOUNTER — Encounter: Payer: Self-pay | Admitting: Emergency Medicine

## 2020-12-17 VITALS — BP 120/80 | HR 69 | Temp 98.1°F | Ht 66.0 in | Wt 153.4 lb

## 2020-12-17 DIAGNOSIS — I152 Hypertension secondary to endocrine disorders: Secondary | ICD-10-CM | POA: Diagnosis not present

## 2020-12-17 DIAGNOSIS — R590 Localized enlarged lymph nodes: Secondary | ICD-10-CM | POA: Diagnosis not present

## 2020-12-17 DIAGNOSIS — R42 Dizziness and giddiness: Secondary | ICD-10-CM

## 2020-12-17 DIAGNOSIS — E1159 Type 2 diabetes mellitus with other circulatory complications: Secondary | ICD-10-CM | POA: Diagnosis not present

## 2020-12-17 DIAGNOSIS — B349 Viral infection, unspecified: Secondary | ICD-10-CM

## 2020-12-17 DIAGNOSIS — E785 Hyperlipidemia, unspecified: Secondary | ICD-10-CM | POA: Diagnosis not present

## 2020-12-17 DIAGNOSIS — E1169 Type 2 diabetes mellitus with other specified complication: Secondary | ICD-10-CM | POA: Diagnosis not present

## 2020-12-17 DIAGNOSIS — G4700 Insomnia, unspecified: Secondary | ICD-10-CM

## 2020-12-17 LAB — CBC WITH DIFFERENTIAL/PLATELET
Basophils Absolute: 0 10*3/uL (ref 0.0–0.1)
Basophils Relative: 0.5 % (ref 0.0–3.0)
Eosinophils Absolute: 0 10*3/uL (ref 0.0–0.7)
Eosinophils Relative: 0.4 % (ref 0.0–5.0)
HCT: 49.1 % (ref 39.0–52.0)
Hemoglobin: 16.1 g/dL (ref 13.0–17.0)
Lymphocytes Relative: 30.7 % (ref 12.0–46.0)
Lymphs Abs: 1.9 10*3/uL (ref 0.7–4.0)
MCHC: 32.8 g/dL (ref 30.0–36.0)
MCV: 85.3 fl (ref 78.0–100.0)
Monocytes Absolute: 0.5 10*3/uL (ref 0.1–1.0)
Monocytes Relative: 8.2 % (ref 3.0–12.0)
Neutro Abs: 3.8 10*3/uL (ref 1.4–7.7)
Neutrophils Relative %: 60.2 % (ref 43.0–77.0)
Platelets: 292 10*3/uL (ref 150.0–400.0)
RBC: 5.76 Mil/uL (ref 4.22–5.81)
RDW: 13.6 % (ref 11.5–15.5)
WBC: 6.3 10*3/uL (ref 4.0–10.5)

## 2020-12-17 LAB — COMPREHENSIVE METABOLIC PANEL
ALT: 16 U/L (ref 0–53)
AST: 16 U/L (ref 0–37)
Albumin: 4.1 g/dL (ref 3.5–5.2)
Alkaline Phosphatase: 68 U/L (ref 39–117)
BUN: 16 mg/dL (ref 6–23)
CO2: 29 mEq/L (ref 19–32)
Calcium: 9.4 mg/dL (ref 8.4–10.5)
Chloride: 102 mEq/L (ref 96–112)
Creatinine, Ser: 0.76 mg/dL (ref 0.40–1.50)
GFR: 92.76 mL/min (ref >60.00)
Glucose, Bld: 78 mg/dL (ref 70–99)
Potassium: 3.8 mEq/L (ref 3.5–5.1)
Sodium: 138 mEq/L (ref 135–145)
Total Bilirubin: 0.6 mg/dL (ref 0.2–1.2)
Total Protein: 7.1 g/dL (ref 6.0–8.3)

## 2020-12-17 NOTE — Progress Notes (Signed)
HISTORY OF PRESENT ILLNESS: This is a 70 y.o. male complaining of feeling tired all the time for the past 2 months. Has had multiple evaluations and work-up without significant findings. Not sleeping well.  Has trouble falling asleep and when he does wakes up after 1 to 2 hours. Wakes up tired and stays tired all day.  Denies snoring or episodes of apnea as per wife. Has history of diabetes on glipizide and Metformin. Blood pressure readings have been normal so he has not been taking amlodipine 2.5 mg. Has history of dyslipidemia on rosuvastatin 10 mg daily. Recently started on propranolol 20 mg at bedtime for chronic headaches.  Seem to be helping a little bit. Reports increased worries and stresses at home. Non-smoker.  Drinks 1 beer 3-4 times a week. No other complaints or medical concerns today. Rheumatology evaluation on 10/01/2020 as follows: Lab results are all negative for evidence of a systemic inflammatory disease as underlying cause of his peripheral circulation or sensation problem. This does not exclude toxic exposure related symptoms but these would not need specialty medication treatment with our clinic so recommend he follow up with his PCP or vascular doctor regarding if they think testing such as lead, arsenic, etc needed. Neurology office visit on 10/30/2020 as follows: ASSESSMENT AND PLAN  70 y.o.year old malehas a past medical history of Diabetes mellitus without complication ([place]), GERD (gastroesophageal reflux disease), Hyperlipidemia, Hypertension, and Psoriasis.here with   Chronic tension-type headache, not intractable  [place] continues to have chronic tension style headaches. We will start propranolol 20mg  at bedtime for 2 weeks. If well tolerated we will increase dose to 20mg  twice daily. He may use Excedrin as needed for abortive therapy. Healthy lifestyle habits encouraged. He will continue close follow up with PCP for dizziness. He will follow up with me in 3-6  months. 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   Chief Complaint  Patient presents with  . Sore Throat    Per patient it is neck soreness maybe glands are swollen for a day or two    HISTORY OF PRESENT ILLNESS: This is a 70 y.o. male complaining of swollen neck lymph nodes that started 2 days ago along with slight sore throat. Also occasional dizzy spells. Has history of chronic tension headaches. History of chronic insomnia.  Ambien working well for him. History of diabetes for 4 to 5 years on metformin and glipizide once a day. No other complaints or medical concerns today. Lab Results  Component Value Date   HGBA1C 5.7 (H) 10/15/2020      HPI   Prior to Admission medications   Medication Sig Start Date End Date Taking? Authorizing Provider  amLODipine (NORVASC) 2.5 MG tablet Take 1 tablet (2.5 mg total) by mouth daily. 07/05/20  Yes Hochrein, Jeneen Rinks, MD  glipiZIDE (GLUCOTROL) 5 MG tablet TAKE 1 TABLET(5 MG) BY MOUTH DAILY BEFORE BREAKFAST 03/05/20  Yes Neelam Tiggs, Ines Bloomer, MD  metFORMIN (GLUCOPHAGE-XR) 500 MG 24 hr tablet TAKE 1 TABLET BY MOUTH DAILY AT 8 PM 11/26/20  Yes Keyla Milone, Ines Bloomer, MD  Multiple Vitamin (MULTI-VITAMIN PO) Take by mouth daily.   Yes [provider]  propranolol (INDERAL) 20 MG tablet Take 1 tablet (20 mg total) by mouth 2 (two) times daily. 10/30/20  Yes Lomax, Amy, NP  rosuvastatin (CRESTOR) 10 MG tablet Take 1 tablet (10 mg total) by mouth daily. 06/12/20  Yes Lennie Dunnigan, Ines Bloomer, MD  triamcinolone (KENALOG) 0.1 % Apply 1 application topically 2 (two) times daily. 11/12/20  Yes  [name], MD  vitamin B-12 (CYANOCOBALAMIN) 500 MCG tablet Take 500 mcg by mouth daily.   Yes [provider]  zolpidem (AMBIEN) 5 MG tablet Take 1 tablet (5 mg total) by mouth at bedtime as needed for sleep. 12/11/20  Yes Aretha Levi, Ines Bloomer, MD  melatonin 5 MG TABS Take 5 mg by mouth. Patient not taking: Reported on 12/17/2020    [provider]   meloxicam (MOBIC) 7.5 MG tablet Take 1 tablet (7.5 mg total) by mouth daily. Patient not taking: Reported on 12/17/2020 10/22/20   Maximiano Coss, NP    Allergies  Allergen Reactions  . Erythromycin Itching  . Levaquin [Levofloxacin In D5w]     Light sensitivity     Patient Active Problem List   Diagnosis Date Noted  . Cold finger without peripheral vascular disease 10/01/2020  . Other psoriasis 10/01/2020  . Coronary artery calcification 06/12/2020  . History of palpitations 06/12/2020  . Tingling of both feet 06/12/2020  . Erythrocytosis 05/08/2020  . Weight loss, non-intentional 05/08/2020  . Night sweats 05/08/2020  . Hypertension associated with diabetes ([place]) 05/29/2019  . Gastroesophageal reflux disease without esophagitis 03/13/2017  . Dyslipidemia associated with type 2 diabetes mellitus ([place]) 03/13/2017    Past Medical History:  Diagnosis Date  . Diabetes mellitus without complication ([place])   . GERD (gastroesophageal reflux disease)   . Hyperlipidemia   . Hypertension   . Psoriasis     History reviewed. No pertinent surgical history.  Social History   Socioeconomic History  . Marital status: Married    Spouse name: Not on file  . Number of children: 0  . Years of education: Not on file  . Highest education level: High school graduate  Occupational History  . Occupation: Press photographer, retired   Tobacco Use  . Smoking status: Former Smoker    Types: Cigarettes    Quit date: 1985    Years since quitting: 37.3  . Smokeless tobacco: Never Used  Vaping Use  . Vaping Use: Never used  Substance and Sexual Activity  . Alcohol use: Yes    Alcohol/week: 7.0 standard drinks    Types: 7 Cans of beer per week    Comment: 1 beer with dinner usually daily  . Drug use: No  . Sexual activity: Yes  Other Topics Concern  . Not on file  Social History Narrative   Marital status:  Married.             Children:   none      Lives:      Employment:  Press photographer       Tobacco:  none      Alcohol:  About 7 drinks per week      Drugs:   none      Exercise:      Seatbelt:      Education: [employer].      Lives at home with wife   Right handed   Caffeine: 2 cups/day   Social Determinants of Health   Financial Resource Strain: Not on file  Food Insecurity: Not on file  Transportation Needs: Not on file  Physical Activity: Not on file  Stress: Not on file  Social Connections: Not on file  Intimate Partner Violence: Not on file    Family History  Problem Relation Age of Onset  . Cancer Mother        breast ca spread to bone  . Cancer Father 35  brain cancer  . Migraines Neg Hx   . Headache Neg Hx      Review of Systems  Constitutional: Negative.  Negative for chills and fever.  HENT: Positive for sore throat. Negative for ear pain and nosebleeds.   Respiratory: Negative.  Negative for cough and shortness of breath.   Cardiovascular: Negative.  Negative for palpitations.  Gastrointestinal: Negative.  Negative for abdominal pain, blood in stool, diarrhea, melena, nausea and vomiting.  Genitourinary: Negative.  Negative for dysuria and hematuria.  Musculoskeletal: Negative.  Negative for back pain, myalgias and neck pain.  Skin: Negative.  Negative for rash.  Neurological: Positive for headaches. Negative for dizziness.  Psychiatric/Behavioral: The patient has insomnia.   All other systems reviewed and are negative.    Today's Vitals   12/17/20 1318  BP: 120/80  Pulse: 69  Temp: 98.1 F (36.7 C)  TempSrc: Oral  SpO2: 99%  Weight: 153 lb 6.4 oz (69.6 kg)  Height: 5\' 6"  (1.676 m)   Body mass index is 24.76 kg/m.   Physical Exam Vitals reviewed.  Constitutional:      Appearance: Normal appearance.  HENT:     Head: Normocephalic.     Mouth/Throat:     Mouth: Mucous membranes are moist.     Pharynx: Oropharynx is clear.  Eyes:     Extraocular Movements: Extraocular movements intact.     Conjunctiva/sclera:  Conjunctivae normal.     Pupils: Pupils are equal, round, and reactive to light.  Cardiovascular:     Rate and Rhythm: Normal rate and regular rhythm.     Pulses: Normal pulses.     Heart sounds: Normal heart sounds.  Pulmonary:     Effort: Pulmonary effort is normal.     Breath sounds: Normal breath sounds.  Musculoskeletal:        General: Normal range of motion.     Cervical back: Normal range of motion and neck supple. No rigidity or tenderness.  Lymphadenopathy:     Cervical: Cervical adenopathy (Mild inflammation) present.  Skin:    General: Skin is warm and dry.  Neurological:     General: No focal deficit present.     Mental Status: He is alert and oriented to person, place, and time.      ASSESSMENT & PLAN: Clinically stable.  No medical concerns identified during this visit.  Symptoms most likely secondary to an acute viral illness.  We will monitor and follow-up as needed.  Blood work to be done today.  Ysabel was seen today for sore throat.  Diagnoses and all orders for this visit:  Viral illness -     CBC with Differential/Platelet -     Comprehensive metabolic panel  Hypertension associated with diabetes ([place])  Dyslipidemia associated with type 2 diabetes mellitus ([facility])  Insomnia, unspecified type  Cervical lymphadenopathy  Dizzy spells    Patient Instructions   Health Maintenance After Age 61 After age 74, you are at a higher risk for certain long-term diseases and infections as well as injuries from falls. Falls are a major cause of broken bones and head injuries in people who are older than age 15. Getting regular preventive care can help to keep you healthy and well. Preventive care includes getting regular testing and making lifestyle changes as recommended by your health care provider. Talk with your health care provider about:  Which screenings and tests you should have. A screening is a test that checks for a disease when you have no  symptoms.  A diet and exercise plan that is right for you. What should I know about screenings and tests to prevent falls? Screening and testing are the best ways to find a health problem early. Early diagnosis and treatment give you the best chance of managing medical conditions that are common after age 71. Certain conditions and lifestyle choices may make you more likely to have a fall. Your health care provider may recommend:  Regular vision checks. Poor vision and conditions such as cataracts can make you more likely to have a fall. If you wear glasses, make sure to get your prescription updated if your vision changes.  Medicine review. Work with your health care provider to regularly review all of the medicines you are taking, including over-the-counter medicines. Ask your health care provider about any side effects that may make you more likely to have a fall. Tell your health care provider if any medicines that you take make you feel dizzy or sleepy.  Osteoporosis screening. Osteoporosis is a condition that causes the bones to get weaker. This can make the bones weak and cause them to break more easily.  Blood pressure screening. Blood pressure changes and medicines to control blood pressure can make you feel dizzy.  Strength and balance checks. Your health care provider may recommend certain tests to check your strength and balance while standing, walking, or changing positions.  Foot health exam. Foot pain and numbness, as well as not wearing proper footwear, can make you more likely to have a fall.  Depression screening. You may be more likely to have a fall if you have a fear of falling, feel emotionally low, or feel unable to do activities that you used to do.  Alcohol use screening. Using too much alcohol can affect your balance and may make you more likely to have a fall. What actions can I take to lower my risk of falls? General instructions  Talk with your health care  provider about your risks for falling. Tell your health care provider if: ? You fall. Be sure to tell your health care provider about all falls, even ones that seem minor. ? You feel dizzy, sleepy, or off-balance.  Take over-the-counter and prescription medicines only as told by your health care provider. These include any supplements.  Eat a healthy diet and maintain a healthy weight. A healthy diet includes low-fat dairy products, low-fat (lean) meats, and fiber from whole grains, beans, and lots of fruits and vegetables. Home safety  Remove any tripping hazards, such as rugs, cords, and clutter.  Install safety equipment such as grab bars in bathrooms and safety rails on stairs.  Keep rooms and walkways well-lit. Activity  Follow a regular exercise program to stay fit. This will help you maintain your balance. Ask your health care provider what types of exercise are appropriate for you.  If you need a cane or walker, use it as recommended by your health care provider.  Wear supportive shoes that have nonskid soles.   Lifestyle  Do not drink alcohol if your health care provider tells you not to drink.  If you drink alcohol, limit how much you have: ? 0-1 drink a day for women. ? 0-2 drinks a day for men.  Be aware of how much alcohol is in your drink. In the [place] one drink equals one typical bottle of beer (12 oz), one-half glass of wine (5 oz), or one shot of hard liquor (1 oz).  Do not use any products  that contain nicotine or tobacco, such as cigarettes and e-cigarettes. If you need help quitting, ask your health care provider. Summary  Having a healthy lifestyle and getting preventive care can help to protect your health and wellness after age 67.  Screening and testing are the best way to find a health problem early and help you avoid having a fall. Early diagnosis and treatment give you the best chance for managing medical conditions that are more common for people who  are older than age 68.  Falls are a major cause of broken bones and head injuries in people who are older than age 48. Take precautions to prevent a fall at home.  Work with your health care provider to learn what changes you can make to improve your health and wellness and to prevent falls. This information is not intended to replace advice given to you by your health care provider. Make sure you discuss any questions you have with your health care provider. Document Revised: 12/08/2018 Document Reviewed: 06/30/2017 Elsevier Patient Education  2021 [place], MD [facility]

## 2020-12-17 NOTE — Patient Instructions (Signed)
Health Maintenance After Age 68 After age 68, you are at a higher risk for certain long-term diseases and infections as well as injuries from falls. Falls are a major cause of broken bones and head injuries in people who are older than age 68. Getting regular preventive care can help to keep you healthy and well. Preventive care includes getting regular testing and making lifestyle changes as recommended by your health care provider. Talk with your health care provider about:  Which screenings and tests you should have. A screening is a test that checks for a disease when you have no symptoms.  A diet and exercise plan that is right for you. What should I know about screenings and tests to prevent falls? Screening and testing are the best ways to find a health problem early. Early diagnosis and treatment give you the best chance of managing medical conditions that are common after age 68. Certain conditions and lifestyle choices may make you more likely to have a fall. Your health care provider may recommend:  Regular vision checks. Poor vision and conditions such as cataracts can make you more likely to have a fall. If you wear glasses, make sure to get your prescription updated if your vision changes.  Medicine review. Work with your health care provider to regularly review all of the medicines you are taking, including over-the-counter medicines. Ask your health care provider about any side effects that may make you more likely to have a fall. Tell your health care provider if any medicines that you take make you feel dizzy or sleepy.  Osteoporosis screening. Osteoporosis is a condition that causes the bones to get weaker. This can make the bones weak and cause them to break more easily.  Blood pressure screening. Blood pressure changes and medicines to control blood pressure can make you feel dizzy.  Strength and balance checks. Your health care provider may recommend certain tests to check your  strength and balance while standing, walking, or changing positions.  Foot health exam. Foot pain and numbness, as well as not wearing proper footwear, can make you more likely to have a fall.  Depression screening. You may be more likely to have a fall if you have a fear of falling, feel emotionally low, or feel unable to do activities that you used to do.  Alcohol use screening. Using too much alcohol can affect your balance and may make you more likely to have a fall. What actions can I take to lower my risk of falls? General instructions  Talk with your health care provider about your risks for falling. Tell your health care provider if: ? You fall. Be sure to tell your health care provider about all falls, even ones that seem minor. ? You feel dizzy, sleepy, or off-balance.  Take over-the-counter and prescription medicines only as told by your health care provider. These include any supplements.  Eat a healthy diet and maintain a healthy weight. A healthy diet includes low-fat dairy products, low-fat (lean) meats, and fiber from whole grains, beans, and lots of fruits and vegetables. Home safety  Remove any tripping hazards, such as rugs, cords, and clutter.  Install safety equipment such as grab bars in bathrooms and safety rails on stairs.  Keep rooms and walkways well-lit. Activity  Follow a regular exercise program to stay fit. This will help you maintain your balance. Ask your health care provider what types of exercise are appropriate for you.  If you need a cane or walker,   use it as recommended by your health care provider.  Wear supportive shoes that have nonskid soles.   Lifestyle  Do not drink alcohol if your health care provider tells you not to drink.  If you drink alcohol, limit how much you have: ? 0-1 drink a day for women. ? 0-2 drinks a day for men.  Be aware of how much alcohol is in your drink. In the [place] one drink equals one typical bottle of beer (12  oz), one-half glass of wine (5 oz), or one shot of hard liquor (1 oz).  Do not use any products that contain nicotine or tobacco, such as cigarettes and e-cigarettes. If you need help quitting, ask your health care provider. Summary  Having a healthy lifestyle and getting preventive care can help to protect your health and wellness after age 68.  Screening and testing are the best way to find a health problem early and help you avoid having a fall. Early diagnosis and treatment give you the best chance for managing medical conditions that are more common for people who are older than age 68.  Falls are a major cause of broken bones and head injuries in people who are older than age 68. Take precautions to prevent a fall at home.  Work with your health care provider to learn what changes you can make to improve your health and wellness and to prevent falls. This information is not intended to replace advice given to you by your health care provider. Make sure you discuss any questions you have with your health care provider. Document Revised: 12/08/2018 Document Reviewed: 06/30/2017 Elsevier Patient Education  2021 [employer]vier Inc[employer]  

## 2020-12-18 NOTE — Telephone Encounter (Signed)
Patient states he would also like his lab results .

## 2020-12-18 NOTE — Telephone Encounter (Signed)
Yes

## 2020-12-21 ENCOUNTER — Other Ambulatory Visit: Payer: Self-pay | Admitting: Emergency Medicine

## 2020-12-21 DIAGNOSIS — E1165 Type 2 diabetes mellitus with hyperglycemia: Secondary | ICD-10-CM

## 2020-12-23 ENCOUNTER — Other Ambulatory Visit: Payer: Self-pay

## 2020-12-23 ENCOUNTER — Encounter: Payer: Self-pay | Admitting: Emergency Medicine

## 2020-12-23 ENCOUNTER — Ambulatory Visit (INDEPENDENT_AMBULATORY_CARE_PROVIDER_SITE_OTHER): Payer: Medicare Other | Admitting: Emergency Medicine

## 2020-12-23 VITALS — BP 110/64 | HR 65 | Temp 98.5°F | Ht 66.0 in | Wt 155.0 lb

## 2020-12-23 DIAGNOSIS — R21 Rash and other nonspecific skin eruption: Secondary | ICD-10-CM | POA: Diagnosis not present

## 2020-12-23 DIAGNOSIS — E1169 Type 2 diabetes mellitus with other specified complication: Secondary | ICD-10-CM

## 2020-12-23 DIAGNOSIS — E785 Hyperlipidemia, unspecified: Secondary | ICD-10-CM

## 2020-12-23 DIAGNOSIS — I152 Hypertension secondary to endocrine disorders: Secondary | ICD-10-CM

## 2020-12-23 DIAGNOSIS — E1159 Type 2 diabetes mellitus with other circulatory complications: Secondary | ICD-10-CM

## 2020-12-23 NOTE — Assessment & Plan Note (Addendum)
Nonspecific rash.  No red flag signs or symptoms.  Benign features. Unlikely vasculitis.  Differential diagnosis discussed with patient. Will refer to dermatology.

## 2020-12-23 NOTE — Progress Notes (Signed)
Aaron [name] 70 y.o.   Chief Complaint  Patient presents with  . Rash    Chest and neck, per patient he noticed on Friday little red spots with tenderness    HISTORY OF PRESENT ILLNESS: This is a 70 y.o. male complaining of faint rash to chest and neck area that started 3 days ago.  Slowly getting better. Not associated with any other symptoms.  No new medications or diet. No other complaints or medical concerns today.  HPI   Prior to Admission medications   Medication Sig Start Date End Date Taking? Authorizing Provider  amLODipine (NORVASC) 2.5 MG tablet Take 1 tablet (2.5 mg total) by mouth daily. 07/05/20  Yes Hochrein, Jeneen Rinks, MD  glipiZIDE (GLUCOTROL) 5 MG tablet TAKE 1 TABLET(5 MG) BY MOUTH DAILY BEFORE BREAKFAST 12/21/20  Yes Lois Ostrom, Ines Bloomer, MD  meloxicam (MOBIC) 7.5 MG tablet Take 1 tablet (7.5 mg total) by mouth daily. 10/22/20  Yes Maximiano Coss, NP  metFORMIN (GLUCOPHAGE-XR) 500 MG 24 hr tablet TAKE 1 TABLET BY MOUTH DAILY AT 8 PM 11/26/20  Yes Grissel Tyrell, Ines Bloomer, MD  Multiple Vitamin (MULTI-VITAMIN PO) Take by mouth daily.   Yes [provider]  propranolol (INDERAL) 20 MG tablet Take 1 tablet (20 mg total) by mouth 2 (two) times daily. 10/30/20  Yes Lomax, Amy, NP  rosuvastatin (CRESTOR) 10 MG tablet Take 1 tablet (10 mg total) by mouth daily. 06/12/20  Yes Rodrick Payson, Ines Bloomer, MD  triamcinolone (KENALOG) 0.1 % Apply 1 application topically 2 (two) times daily. 11/12/20  Yes Lodema Parma, Ines Bloomer, MD  vitamin B-12 (CYANOCOBALAMIN) 500 MCG tablet Take 500 mcg by mouth daily.   Yes [provider]  zolpidem (AMBIEN) 5 MG tablet Take 1 tablet (5 mg total) by mouth at bedtime as needed for sleep. 12/11/20  Yes Marshel Golubski, Ines Bloomer, MD  melatonin 5 MG TABS Take 5 mg by mouth. Patient not taking: Reported on 12/23/2020    [provider]    Allergies  Allergen Reactions  . Erythromycin Itching  . Levaquin [Levofloxacin In D5w]      Light sensitivity     Patient Active Problem List   Diagnosis Date Noted  . Cold finger without peripheral vascular disease 10/01/2020  . Other psoriasis 10/01/2020  . Coronary artery calcification 06/12/2020  . History of palpitations 06/12/2020  . Tingling of both feet 06/12/2020  . Erythrocytosis 05/08/2020  . Weight loss, non-intentional 05/08/2020  . Night sweats 05/08/2020  . Hypertension associated with diabetes ([place]) 05/29/2019  . Gastroesophageal reflux disease without esophagitis 03/13/2017  . Dyslipidemia associated with type 2 diabetes mellitus ([place]) 03/13/2017    Past Medical History:  Diagnosis Date  . Diabetes mellitus without complication ([place])   . GERD (gastroesophageal reflux disease)   . Hyperlipidemia   . Hypertension   . Psoriasis     No past surgical history on file.  Social History   Socioeconomic History  . Marital status: Married    Spouse name: Not on file  . Number of children: 0  . Years of education: Not on file  . Highest education level: High school graduate  Occupational History  . Occupation: Press photographer, retired   Tobacco Use  . Smoking status: Former Smoker    Types: Cigarettes    Quit date: 1985    Years since quitting: 37.3  . Smokeless tobacco: Never Used  Vaping Use  . Vaping Use: Never used  Substance and Sexual Activity  . Alcohol use: Yes  Alcohol/week: 7.0 standard drinks    Types: 7 Cans of beer per week    Comment: 1 beer with dinner usually daily  . Drug use: No  . Sexual activity: Yes  Other Topics Concern  . Not on file  Social History Narrative   Marital status:  Married.             Children:   none      Lives:      Employment:  Press photographer      Tobacco:  none      Alcohol:  About 7 drinks per week      Drugs:   none      Exercise:      Seatbelt:      Education: [employer].      Lives at home with wife   Right handed   Caffeine: 2 cups/day   Social Determinants of Health   Financial Resource Strain:  Not on file  Food Insecurity: Not on file  Transportation Needs: Not on file  Physical Activity: Not on file  Stress: Not on file  Social Connections: Not on file  Intimate Partner Violence: Not on file    Family History  Problem Relation Age of Onset  . Cancer Mother        breast ca spread to bone  . Cancer Father 27       brain cancer  . Migraines Neg Hx   . Headache Neg Hx      Review of Systems  Constitutional: Negative.  Negative for chills and fever.  HENT: Negative.  Negative for congestion and sore throat.   Respiratory: Negative.  Negative for cough and shortness of breath.   Cardiovascular: Negative.  Negative for chest pain and palpitations.  Gastrointestinal: Negative for abdominal pain, diarrhea, nausea and vomiting.  Genitourinary: Negative.  Negative for dysuria.  Musculoskeletal: Negative.  Negative for joint pain and myalgias.  Skin: Positive for rash.  Neurological: Negative.  Negative for dizziness.  All other systems reviewed and are negative.   Today's Vitals   12/23/20 1059  BP: 110/64  Pulse: 65  Temp: 98.5 F (36.9 C)  TempSrc: Oral  SpO2: 98%  Weight: 155 lb (70.3 kg)  Height: 5\' 6"  (1.676 m)   Body mass index is 25.02 kg/m.  Physical Exam Vitals reviewed.  Constitutional:      Appearance: Normal appearance.  HENT:     Head: Normocephalic.  Eyes:     Extraocular Movements: Extraocular movements intact.     Pupils: Pupils are equal, round, and reactive to light.  Cardiovascular:     Rate and Rhythm: Normal rate and regular rhythm.     Pulses: Normal pulses.     Heart sounds: Normal heart sounds.  Pulmonary:     Effort: Pulmonary effort is normal.     Breath sounds: Normal breath sounds.  Musculoskeletal:     Cervical back: Normal range of motion and neck supple.  Skin:    General: Skin is warm and dry.     Capillary Refill: Capillary refill takes less than 2 seconds.     Findings: Rash present.     Comments: Mostly on the  torso: Chronic small angiomas noted. Smaller palpable nontender slightly erythematous spots visible.  No hives.  Faint rash.  Neurological:     General: No focal deficit present.     Mental Status: He is alert and oriented to person, place, and time.  Psychiatric:  Mood and Affect: Mood normal.        Behavior: Behavior normal.      ASSESSMENT & PLAN: Rash and nonspecific skin eruption Nonspecific rash.  No red flag signs or symptoms.  Benign features. Unlikely vasculitis.  Differential diagnosis discussed with patient. Will refer to dermatology.  Daequan was seen today for rash.  Diagnoses and all orders for this visit:  Rash and nonspecific skin eruption -     Ambulatory referral to Dermatology  Hypertension associated with diabetes ([place])  Dyslipidemia associated with type 2 diabetes mellitus ([place])    Patient Instructions   Rash, Adult  A rash is a change in the color of your skin. A rash can also change the way your skin feels. There are many different conditions and factors that can cause a rash. Follow these instructions at home: The goal of treatment is to stop the itching and keep the rash from spreading. Watch for any changes in your symptoms. Let your doctor know about them. Follow these instructions to help with your condition: Medicine Take or apply over-the-counter and prescription medicines only as told by your doctor. These may include medicines:  To treat red or swollen skin (corticosteroid creams).  To treat itching.  To treat an allergy (oral antihistamines).  To treat very bad symptoms (oral corticosteroids).   Skin care  Put cool cloths (compresses) on the affected areas.  Do not scratch or rub your skin.  Avoid covering the rash. Make sure that the rash is exposed to air as much as possible. Managing itching and discomfort  Avoid hot showers or baths. These can make itching worse. A cold shower may help.  Try taking a bath  with: ? Epsom salts. You can get these at your local pharmacy or grocery store. Follow the instructions on the package. ? Baking soda. Pour a small amount into the bath as told by your doctor. ? Colloidal oatmeal. You can get this at your local pharmacy or grocery store. Follow the instructions on the package.  Try putting baking soda paste onto your skin. Stir water into baking soda until it gets like a paste.  Try putting on a lotion that relieves itchiness (calamine lotion).  Keep cool and out of the sun. Sweating and being hot can make itching worse. General instructions  Rest as needed.  Drink enough fluid to keep your pee (urine) pale yellow.  Wear loose-fitting clothing.  Avoid scented soaps, detergents, and perfumes. Use gentle soaps, detergents, perfumes, and other cosmetic products.  Avoid anything that causes your rash. Keep a journal to help track what causes your rash. Write down: ? What you eat. ? What cosmetic products you use. ? What you drink. ? What you wear. This includes jewelry.  Keep all follow-up visits as told by your doctor. This is important.   Contact a doctor if:  You sweat at night.  You lose weight.  You pee (urinate) more than normal.  You pee less than normal, or you notice that your pee is a darker color than normal.  You feel weak.  You throw up (vomit).  Your skin or the whites of your eyes look yellow (jaundice).  Your skin: ? Tingles. ? Is numb.  Your rash: ? Does not go away after a few days. ? Gets worse.  You are: ? More thirsty than normal. ? More tired than normal.  You have: ? New symptoms. ? Pain in your belly (abdomen). ? A fever. ? Watery poop (  diarrhea). Get help right away if:  You have a fever and your symptoms suddenly get worse.  You start to feel mixed up (confused).  You have a very bad headache or a stiff neck.  You have very bad joint pains or stiffness.  You have jerky movements that you  cannot control (seizure).  Your rash covers all or most of your body. The rash may or may not be painful.  You have blisters that: ? Are on top of the rash. ? Grow larger. ? Grow together. ? Are painful. ? Are inside your nose or mouth.  You have a rash that: ? Looks like purple pinprick-sized spots all over your body. ? Has a "bull's eye" or looks like a target. ? Is red and painful, causes your skin to peel, and is not from being in the sun too long. Summary  A rash is a change in the color of your skin. A rash can also change the way your skin feels.  The goal of treatment is to stop the itching and keep the rash from spreading.  Take or apply over-the-counter and prescription medicines only as told by your doctor.  Contact a doctor if you have new symptoms or symptoms that get worse.  Keep all follow-up visits as told by your doctor. This is important. This information is not intended to replace advice given to you by your health care provider. Make sure you discuss any questions you have with your health care provider. Document Revised: 12/09/2018 Document Reviewed: 03/21/2018 Elsevier Patient Education  2021 [place], MD [facility]

## 2020-12-23 NOTE — Patient Instructions (Signed)
Rash, Adult  A rash is a change in the color of your skin. A rash can also change the way your skin feels. There are many different conditions and factors that can cause a rash. Follow these instructions at home: The goal of treatment is to stop the itching and keep the rash from spreading. Watch for any changes in your symptoms. Let your doctor know about them. Follow these instructions to help with your condition: Medicine Take or apply over-the-counter and prescription medicines only as told by your doctor. These may include medicines:  To treat red or swollen skin (corticosteroid creams).  To treat itching.  To treat an allergy (oral antihistamines).  To treat very bad symptoms (oral corticosteroids).   Skin care  Put cool cloths (compresses) on the affected areas.  Do not scratch or rub your skin.  Avoid covering the rash. Make sure that the rash is exposed to air as much as possible. Managing itching and discomfort  Avoid hot showers or baths. These can make itching worse. A cold shower may help.  Try taking a bath with: ? Epsom salts. You can get these at your local pharmacy or grocery store. Follow the instructions on the package. ? Baking soda. Pour a small amount into the bath as told by your doctor. ? Colloidal oatmeal. You can get this at your local pharmacy or grocery store. Follow the instructions on the package.  Try putting baking soda paste onto your skin. Stir water into baking soda until it gets like a paste.  Try putting on a lotion that relieves itchiness (calamine lotion).  Keep cool and out of the sun. Sweating and being hot can make itching worse. General instructions  Rest as needed.  Drink enough fluid to keep your pee (urine) pale yellow.  Wear loose-fitting clothing.  Avoid scented soaps, detergents, and perfumes. Use gentle soaps, detergents, perfumes, and other cosmetic products.  Avoid anything that causes your rash. Keep a journal to help  track what causes your rash. Write down: ? What you eat. ? What cosmetic products you use. ? What you drink. ? What you wear. This includes jewelry.  Keep all follow-up visits as told by your doctor. This is important.   Contact a doctor if:  You sweat at night.  You lose weight.  You pee (urinate) more than normal.  You pee less than normal, or you notice that your pee is a darker color than normal.  You feel weak.  You throw up (vomit).  Your skin or the whites of your eyes look yellow (jaundice).  Your skin: ? Tingles. ? Is numb.  Your rash: ? Does not go away after a few days. ? Gets worse.  You are: ? More thirsty than normal. ? More tired than normal.  You have: ? New symptoms. ? Pain in your belly (abdomen). ? A fever. ? Watery poop (diarrhea). Get help right away if:  You have a fever and your symptoms suddenly get worse.  You start to feel mixed up (confused).  You have a very bad headache or a stiff neck.  You have very bad joint pains or stiffness.  You have jerky movements that you cannot control (seizure).  Your rash covers all or most of your body. The rash may or may not be painful.  You have blisters that: ? Are on top of the rash. ? Grow larger. ? Grow together. ? Are painful. ? Are inside your nose or mouth.  You have   a rash that: ? Looks like purple pinprick-sized spots all over your body. ? Has a "bull's eye" or looks like a target. ? Is red and painful, causes your skin to peel, and is not from being in the sun too long. Summary  A rash is a change in the color of your skin. A rash can also change the way your skin feels.  The goal of treatment is to stop the itching and keep the rash from spreading.  Take or apply over-the-counter and prescription medicines only as told by your doctor.  Contact a doctor if you have new symptoms or symptoms that get worse.  Keep all follow-up visits as told by your doctor. This is  important. This information is not intended to replace advice given to you by your health care provider. Make sure you discuss any questions you have with your health care provider. Document Revised: 12/09/2018 Document Reviewed: 03/21/2018 Elsevier Patient Education  2021 [employer] Inc[employer]  

## 2020-12-24 ENCOUNTER — Other Ambulatory Visit: Payer: Self-pay | Admitting: Neurology

## 2020-12-24 ENCOUNTER — Encounter: Payer: Self-pay | Admitting: Emergency Medicine

## 2020-12-24 MED ORDER — PROPRANOLOL HCL ER 60 MG PO CP24: 60.0000 mg | ORAL_CAPSULE | Freq: Every day | ORAL | 1 refills | Status: DC

## 2020-12-25 NOTE — Telephone Encounter (Signed)
Patient spoke to dermatology and they cannot see him until October. Patient wondering what needs to be done

## 2020-12-27 DIAGNOSIS — L400 Psoriasis vulgaris: Secondary | ICD-10-CM | POA: Diagnosis not present

## 2020-12-27 DIAGNOSIS — B078 Other viral warts: Secondary | ICD-10-CM | POA: Diagnosis not present

## 2020-12-27 DIAGNOSIS — D1801 Hemangioma of skin and subcutaneous tissue: Secondary | ICD-10-CM | POA: Diagnosis not present

## 2020-12-27 DIAGNOSIS — L821 Other seborrheic keratosis: Secondary | ICD-10-CM | POA: Diagnosis not present

## 2020-12-27 DIAGNOSIS — L814 Other melanin hyperpigmentation: Secondary | ICD-10-CM | POA: Diagnosis not present

## 2020-12-27 DIAGNOSIS — D225 Melanocytic nevi of trunk: Secondary | ICD-10-CM | POA: Diagnosis not present

## 2021-01-01 ENCOUNTER — Encounter: Payer: Self-pay | Admitting: Emergency Medicine

## 2021-01-02 ENCOUNTER — Encounter: Payer: Self-pay | Admitting: Family Medicine

## 2021-01-03 ENCOUNTER — Other Ambulatory Visit: Payer: Self-pay | Admitting: Emergency Medicine

## 2021-01-06 ENCOUNTER — Other Ambulatory Visit: Payer: Self-pay | Admitting: Emergency Medicine

## 2021-01-07 DIAGNOSIS — H43812 Vitreous degeneration, left eye: Secondary | ICD-10-CM | POA: Diagnosis not present

## 2021-01-08 ENCOUNTER — Encounter: Payer: Self-pay | Admitting: Emergency Medicine

## 2021-01-08 ENCOUNTER — Ambulatory Visit (INDEPENDENT_AMBULATORY_CARE_PROVIDER_SITE_OTHER): Payer: Medicare Other | Admitting: Emergency Medicine

## 2021-01-08 ENCOUNTER — Other Ambulatory Visit: Payer: Self-pay

## 2021-01-08 ENCOUNTER — Ambulatory Visit: Payer: Medicare Other | Admitting: Family Medicine

## 2021-01-08 VITALS — BP 148/92 | HR 82 | Temp 98.4°F | Wt 154.0 lb

## 2021-01-08 DIAGNOSIS — E785 Hyperlipidemia, unspecified: Secondary | ICD-10-CM | POA: Diagnosis not present

## 2021-01-08 DIAGNOSIS — I7300 Raynaud's syndrome without gangrene: Secondary | ICD-10-CM

## 2021-01-08 DIAGNOSIS — I152 Hypertension secondary to endocrine disorders: Secondary | ICD-10-CM | POA: Diagnosis not present

## 2021-01-08 DIAGNOSIS — E1159 Type 2 diabetes mellitus with other circulatory complications: Secondary | ICD-10-CM

## 2021-01-08 DIAGNOSIS — F419 Anxiety disorder, unspecified: Secondary | ICD-10-CM | POA: Diagnosis not present

## 2021-01-08 DIAGNOSIS — E1169 Type 2 diabetes mellitus with other specified complication: Secondary | ICD-10-CM

## 2021-01-08 NOTE — Assessment & Plan Note (Addendum)
Suspected Raynaud's exacerbated by beta-blocker propranolol.  Not working for his chronic headaches so we decided to stop it. No signs of vasculitis process.

## 2021-01-08 NOTE — Progress Notes (Signed)
Aaron [name] 70 y.o.   Chief Complaint  Patient presents with  . Medication Problem    Pt unsure of cause of side effects & believes it could be related to medications    HISTORY OF PRESENT ILLNESS: This is a 70 y.o. male complaining of cold hands and feet since starting propranolol couple months ago. Was started on propranolol by his neurologist for the treatment of chronic headaches but it has not helped much.  Still has almost daily headaches. Saw a rheumatologist recently Dr. Benjamine Mola who thinks he was having Raynaud's phenomenon.  No other rheumatologic condition was identified with negative inflammatory markers. Aaron [name] brought in a list made by his wife listing different times and different situations when Aaron [name] demonstrated extreme anxiety behavior along with excessive worrying moments.  He acknowledges he worries too much about everything.  He is very worried about his symptoms and denies suicidal thoughts even though sometimes he thinks he might be better off dead. No other complaints or medical concerns today.  Depression screen [facility] 2/9 01/08/2021 12/23/2020 12/17/2020 12/17/2020 11/12/2020  Decreased Interest 2 1 1 1  0  Down, Depressed, Hopeless 3 1 1 1  0  PHQ - 2 Score 5 2 2 2  0  Altered sleeping 3 1 1  - 2  Tired, decreased energy 3 2 2  - 2  Change in appetite 0 0 0 - 0  Feeling bad or failure about yourself  3 1 1  - 1  Trouble concentrating 0 0 0 - 0  Moving slowly or fidgety/restless 0 0 0 - 0  Suicidal thoughts 2 0 0 - 1  PHQ-9 Score 16 6 6  - 6  Difficult doing work/chores Somewhat difficult Somewhat difficult - - -  Some recent data might be hidden     HPI   Prior to Admission medications   Medication Sig Start Date End Date Taking? Authorizing Provider  amLODipine (NORVASC) 2.5 MG tablet Take 1 tablet (2.5 mg total) by mouth daily. 07/05/20  Yes Minus Breeding, MD  metFORMIN (GLUCOPHAGE-XR) 500 MG 24 hr tablet TAKE 1 TABLET BY MOUTH DAILY AT 8 PM 11/26/20  Yes Aaron [name],  Aaron Bloomer, MD  Multiple Vitamin (MULTI-VITAMIN PO) Take by mouth daily.   Yes [provider]  propranolol ER (INDERAL LA) 60 MG 24 hr capsule Take 1 capsule (60 mg total) by mouth at bedtime. 12/24/20  Yes Melvenia Beam, MD  rosuvastatin (CRESTOR) 10 MG tablet Take 1 tablet (10 mg total) by mouth daily. 06/12/20  Yes Bronwen Pendergraft, Aaron Bloomer, MD  triamcinolone (KENALOG) 0.1 % Apply 1 application topically 2 (two) times daily. 11/12/20  Yes Aaron [name], Aaron Bloomer, MD  vitamin B-12 (CYANOCOBALAMIN) 500 MCG tablet Take 500 mcg by mouth daily.   Yes [provider]  zolpidem (AMBIEN) 5 MG tablet Take 1 tablet (5 mg total) by mouth at bedtime as needed for sleep. 12/11/20  Yes Shadeed Colberg, Aaron Bloomer, MD  glipiZIDE (GLUCOTROL) 5 MG tablet TAKE 1 TABLET(5 MG) BY MOUTH DAILY BEFORE BREAKFAST Patient not taking: Reported on 01/08/2021 12/21/20   [name], MD  melatonin 5 MG TABS Take 5 mg by mouth. Patient not taking: No sig reported    [provider]  meloxicam (MOBIC) 7.5 MG tablet Take 1 tablet (7.5 mg total) by mouth daily. Patient not taking: Reported on 01/08/2021 10/22/20   Aaron Coss, NP    Allergies  Allergen Reactions  . Erythromycin Itching  . Levaquin [Levofloxacin In D5w]     Light sensitivity  Patient Active Problem List   Diagnosis Date Noted  . Rash and nonspecific skin eruption 12/23/2020  . Cold finger without peripheral vascular disease 10/01/2020  . Other psoriasis 10/01/2020  . Coronary artery calcification 06/12/2020  . History of palpitations 06/12/2020  . Tingling of both feet 06/12/2020  . Erythrocytosis 05/08/2020  . Weight loss, non-intentional 05/08/2020  . Night sweats 05/08/2020  . Hypertension associated with diabetes ([place]) 05/29/2019  . Gastroesophageal reflux disease without esophagitis 03/13/2017  . Dyslipidemia associated with type 2 diabetes mellitus ([place]) 03/13/2017    Past Medical History:  Diagnosis Date   . Diabetes mellitus without complication ([place])   . GERD (gastroesophageal reflux disease)   . Hyperlipidemia   . Hypertension   . Psoriasis     History reviewed. No pertinent surgical history.  Social History   Socioeconomic History  . Marital status: Married    Spouse name: Not on file  . Number of children: 0  . Years of education: Not on file  . Highest education level: High school graduate  Occupational History  . Occupation: Press photographer, retired   Tobacco Use  . Smoking status: Former Smoker    Types: Cigarettes    Quit date: 1985    Years since quitting: 37.3  . Smokeless tobacco: Never Used  Vaping Use  . Vaping Use: Never used  Substance and Sexual Activity  . Alcohol use: Yes    Alcohol/week: 7.0 standard drinks    Types: 7 Cans of beer per week    Comment: 1 beer with dinner usually daily  . Drug use: No  . Sexual activity: Yes  Other Topics Concern  . Not on file  Social History Narrative   Marital status:  Married.             Children:   none      Lives:      Employment:  Press photographer      Tobacco:  none      Alcohol:  About 7 drinks per week      Drugs:   none      Exercise:      Seatbelt:      Education: [employer].      Lives at home with wife   Right handed   Caffeine: 2 cups/day   Social Determinants of Health   Financial Resource Strain: Not on file  Food Insecurity: Not on file  Transportation Needs: Not on file  Physical Activity: Not on file  Stress: Not on file  Social Connections: Not on file  Intimate Partner Violence: Not on file    Family History  Problem Relation Age of Onset  . Cancer Mother        breast ca spread to bone  . Cancer Father 41       brain cancer  . Migraines Neg Hx   . Headache Neg Hx      Review of Systems  Constitutional: Negative.  Negative for chills and fever.  HENT: Negative.  Negative for congestion and sore throat.   Respiratory: Negative.  Negative for cough and shortness of breath.    Cardiovascular: Negative.  Negative for chest pain and palpitations.  Gastrointestinal: Negative for abdominal pain, diarrhea, nausea and vomiting.  Genitourinary: Negative.  Negative for dysuria and hematuria.  Skin: Negative.  Negative for rash.  Neurological: Negative.  Negative for dizziness and headaches.  All other systems reviewed and are negative.  Today's Vitals   01/08/21 1321  BP: Marland Kitchen)  148/92  Pulse: 82  Temp: 98.4 F (36.9 C)  TempSrc: Oral  SpO2: 99%  Weight: 154 lb (69.9 kg)   Body mass index is 24.86 kg/m. Wt Readings from Last 3 Encounters:  01/08/21 154 lb (69.9 kg)  12/23/20 155 lb (70.3 kg)  12/17/20 153 lb 6.4 oz (69.6 kg)     Physical Exam Vitals reviewed.  Constitutional:      Appearance: Normal appearance.  HENT:     Head: Normocephalic.  Eyes:     Extraocular Movements: Extraocular movements intact.     Pupils: Pupils are equal, round, and reactive to light.  Cardiovascular:     Rate and Rhythm: Normal rate and regular rhythm.     Pulses: Normal pulses.     Heart sounds: Normal heart sounds.  Pulmonary:     Effort: Pulmonary effort is normal.     Breath sounds: Normal breath sounds.  Musculoskeletal:     Cervical back: Normal range of motion and neck supple.  Skin:    General: Skin is warm and dry.     Capillary Refill: Capillary refill takes less than 2 seconds.  Neurological:     General: No focal deficit present.     Mental Status: He is alert and oriented to person, place, and time.  Psychiatric:        Mood and Affect: Mood normal.        Behavior: Behavior normal.      ASSESSMENT & PLAN: A total of 30 minutes was spent with the patient and counseling/coordination of care regarding differential diagnosis of his symptoms, review of most recent office visit notes, review of most recent rheumatologist office visit note, review of most recent neurology office visit note, review of most recent blood work results, possibility of  Raynaud's phenomena being triggered by propranolol, long discussion regarding his chronic anxiety and depression and need for evaluation and treatment, prognosis, documentation and need for follow-up.  Chronic anxiety Chronic anxiety with multiple triggers.  Patient needs evaluation by behavioral health services.  High depression score.  Does not want to start medication at present time but does not rule it out for the near future.  Raynaud's phenomenon without gangrene Suspected Raynaud's exacerbated by beta-blocker propranolol.  Not working for his chronic headaches so we decided to stop it. No signs of vasculitis process.  Coren was seen today for medication problem.  Diagnoses and all orders for this visit:  Chronic anxiety -     Ambulatory referral to Psychiatry  Raynaud's phenomenon without gangrene  Hypertension associated with diabetes ([place])  Dyslipidemia associated with type 2 diabetes mellitus [facility])    Patient Instructions  Stop propranolol and monitor symptoms for the next couple of weeks. Contact neurologist and schedule office visit.  Raynaud Phenomenon  Raynaud phenomenon is a condition that affects the blood vessels (arteries) that carry blood to your fingers and toes. The arteries that supply blood to your ears, lips, nipples, or the tip of your nose might also be affected. Raynaud phenomenon causes the arteries to become narrow temporarily (spasm). As a result, the flow of blood to the affected areas is temporarily decreased. This usually occurs in response to cold temperatures or stress. During an attack, the skin in the affected areas turns white, then blue, and finally red. You may also feel tingling or numbness in those areas. Attacks usually last for only a brief period, and then the blood flow to the area returns to normal. In most cases, Raynaud phenomenon  does not cause serious health problems. What are the causes? In many cases, the cause of this condition  is not known. The condition may occur on its own (primary Raynaud phenomenon) or may be associated with other diseases or factors (secondary Raynaud phenomenon). Possible causes may include:  Diseases or medical conditions that damage the arteries.  Injuries and repetitive actions that hurt the hands or feet.  Being exposed to certain chemicals.  Taking medicines that narrow the arteries.  Other medical conditions, such as lupus, scleroderma, rheumatoid arthritis, thyroid problems, blood disorders, Sjogren syndrome, or atherosclerosis. What increases the risk? The following factors may make you more likely to develop this condition:  Being 7-82 years old.  Being male.  Having a family history of Raynaud phenomenon.  Living in a cold climate.  Smoking. What are the signs or symptoms? Symptoms of this condition usually occur when you are exposed to cold temperatures or when you have emotional stress. The symptoms may last for a few minutes or up to several hours. They usually affect your fingers but may also affect your toes, nipples, lips, ears, or the tip of your nose. Symptoms may include:  Changes in skin color. The skin in the affected areas will turn pale or white. The skin may then change from white to bluish to red as normal blood flow returns to the area.  Numbness, tingling, or pain in the affected areas. In severe cases, symptoms may include:  Skin sores.  Tissues decaying and dying (gangrene). How is this diagnosed? This condition may be diagnosed based on:  Your symptoms and medical history.  A physical exam. During the exam, you may be asked to put your hands in cold water to check for a reaction to cold temperature.  Tests, such as: ? Blood tests to check for other diseases or conditions. ? A test to check the movement of blood through your arteries and veins (vascular ultrasound). ? A test in which the skin at the base of your fingernail is examined under  a microscope (nailfold capillaroscopy). How is this treated? Treatment for this condition often involves making lifestyle changes and taking steps to control your exposure to cold temperatures. For more severe cases, medicine (calcium channel blockers) may be used to improve blood flow. Surgery is sometimes done to block the nerves that control the affected arteries, but this is rare. Follow these instructions at home: Avoiding cold temperatures Take these steps to avoid exposure to cold:  If possible, stay indoors during cold weather.  When you go outside during cold weather, dress in layers and wear mittens, a hat, a scarf, and warm footwear.  Wear mittens or gloves when handling ice or frozen food.  Use holders for glasses or cans containing cold drinks.  Let warm water run for a while before taking a shower or bath.  Warm up the car before driving in cold weather. Lifestyle  If possible, avoid stressful and emotional situations. Try to find ways to manage your stress, such as: ? Exercise. ? Yoga. ? Meditation. ? Biofeedback.  Do not use any products that contain nicotine or tobacco, such as cigarettes and e-cigarettes. If you need help quitting, ask your health care provider.  Avoid secondhand smoke.  Limit your use of caffeine. ? Switch to decaffeinated coffee, tea, and soda. ? Avoid chocolate.  Avoid vibrating tools and machinery. General instructions  Protect your hands and feet from injuries, cuts, or bruises.  Avoid wearing tight rings or wristbands.  Wear  loose fitting socks and comfortable, roomy shoes.  Take over-the-counter and prescription medicines only as told by your health care provider. Contact a health care provider if:  Your discomfort becomes worse despite lifestyle changes.  You develop sores on your fingers or toes that do not heal.  Your fingers or toes turn black.  You have breaks in the skin on your fingers or toes.  You have a  fever.  You have pain or swelling in your joints.  You have a rash.  Your symptoms occur on only one side of your body. Summary  Raynaud phenomenon is a condition that affects the arteries that carry blood to your fingers, toes, ears, lips, nipples, or the tip of your nose.  In many cases, the cause of this condition is not known.  Symptoms of this condition include changes in skin color, and numbness and tingling of the affected area.  Treatment for this condition includes lifestyle changes, reducing exposure to cold temperatures, and using medicines for severe cases of the condition.  Contact your health care provider if your condition worsens despite treatment. This information is not intended to replace advice given to you by your health care provider. Make sure you discuss any questions you have with your health care provider. Document Revised: 12/28/2019 Document Reviewed: 12/28/2019 Elsevier Patient Education  2021 [place], MD [facility]

## 2021-01-08 NOTE — Assessment & Plan Note (Signed)
Chronic anxiety with multiple triggers.  Patient needs evaluation by behavioral health services.  High depression score.  Does not want to start medication at present time but does not rule it out for the near future.

## 2021-01-08 NOTE — Patient Instructions (Signed)
Stop propranolol and monitor symptoms for the next couple of weeks. Contact neurologist and schedule office visit.  Raynaud Phenomenon  Raynaud phenomenon is a condition that affects the blood vessels (arteries) that carry blood to your fingers and toes. The arteries that supply blood to your ears, lips, nipples, or the tip of your nose might also be affected. Raynaud phenomenon causes the arteries to become narrow temporarily (spasm). As a result, the flow of blood to the affected areas is temporarily decreased. This usually occurs in response to cold temperatures or stress. During an attack, the skin in the affected areas turns white, then blue, and finally red. You may also feel tingling or numbness in those areas. Attacks usually last for only a brief period, and then the blood flow to the area returns to normal. In most cases, Raynaud phenomenon does not cause serious health problems. What are the causes? In many cases, the cause of this condition is not known. The condition may occur on its own (primary Raynaud phenomenon) or may be associated with other diseases or factors (secondary Raynaud phenomenon). Possible causes may include:  Diseases or medical conditions that damage the arteries.  Injuries and repetitive actions that hurt the hands or feet.  Being exposed to certain chemicals.  Taking medicines that narrow the arteries.  Other medical conditions, such as lupus, scleroderma, rheumatoid arthritis, thyroid problems, blood disorders, Sjogren syndrome, or atherosclerosis. What increases the risk? The following factors may make you more likely to develop this condition:  Being 68-68 years old.  Being male.  Having a family history of Raynaud phenomenon.  Living in a cold climate.  Smoking. What are the signs or symptoms? Symptoms of this condition usually occur when you are exposed to cold temperatures or when you have emotional stress. The symptoms may last for a few  minutes or up to several hours. They usually affect your fingers but may also affect your toes, nipples, lips, ears, or the tip of your nose. Symptoms may include:  Changes in skin color. The skin in the affected areas will turn pale or white. The skin may then change from white to bluish to red as normal blood flow returns to the area.  Numbness, tingling, or pain in the affected areas. In severe cases, symptoms may include:  Skin sores.  Tissues decaying and dying (gangrene). How is this diagnosed? This condition may be diagnosed based on:  Your symptoms and medical history.  A physical exam. During the exam, you may be asked to put your hands in cold water to check for a reaction to cold temperature.  Tests, such as: ? Blood tests to check for other diseases or conditions. ? A test to check the movement of blood through your arteries and veins (vascular ultrasound). ? A test in which the skin at the base of your fingernail is examined under a microscope (nailfold capillaroscopy). How is this treated? Treatment for this condition often involves making lifestyle changes and taking steps to control your exposure to cold temperatures. For more severe cases, medicine (calcium channel blockers) may be used to improve blood flow. Surgery is sometimes done to block the nerves that control the affected arteries, but this is rare. Follow these instructions at home: Avoiding cold temperatures Take these steps to avoid exposure to cold:  If possible, stay indoors during cold weather.  When you go outside during cold weather, dress in layers and wear mittens, a hat, a scarf, and warm footwear.  Wear mittens or  gloves when handling ice or frozen food.  Use holders for glasses or cans containing cold drinks.  Let warm water run for a while before taking a shower or bath.  Warm up the car before driving in cold weather. Lifestyle  If possible, avoid stressful and emotional situations. Try  to find ways to manage your stress, such as: ? Exercise. ? Yoga. ? Meditation. ? Biofeedback.  Do not use any products that contain nicotine or tobacco, such as cigarettes and e-cigarettes. If you need help quitting, ask your health care provider.  Avoid secondhand smoke.  Limit your use of caffeine. ? Switch to decaffeinated coffee, tea, and soda. ? Avoid chocolate.  Avoid vibrating tools and machinery. General instructions  Protect your hands and feet from injuries, cuts, or bruises.  Avoid wearing tight rings or wristbands.  Wear loose fitting socks and comfortable, roomy shoes.  Take over-the-counter and prescription medicines only as told by your health care provider. Contact a health care provider if:  Your discomfort becomes worse despite lifestyle changes.  You develop sores on your fingers or toes that do not heal.  Your fingers or toes turn black.  You have breaks in the skin on your fingers or toes.  You have a fever.  You have pain or swelling in your joints.  You have a rash.  Your symptoms occur on only one side of your body. Summary  Raynaud phenomenon is a condition that affects the arteries that carry blood to your fingers, toes, ears, lips, nipples, or the tip of your nose.  In many cases, the cause of this condition is not known.  Symptoms of this condition include changes in skin color, and numbness and tingling of the affected area.  Treatment for this condition includes lifestyle changes, reducing exposure to cold temperatures, and using medicines for severe cases of the condition.  Contact your health care provider if your condition worsens despite treatment. This information is not intended to replace advice given to you by your health care provider. Make sure you discuss any questions you have with your health care provider. Document Revised: 12/28/2019 Document Reviewed: 12/28/2019 Elsevier Patient Education  [place].

## 2021-01-08 NOTE — Progress Notes (Signed)
Dr Mitchel Honour aware of PHQ-9 of 16 with no meds.

## 2021-01-09 ENCOUNTER — Ambulatory Visit (INDEPENDENT_AMBULATORY_CARE_PROVIDER_SITE_OTHER): Payer: Medicare Other | Admitting: Family Medicine

## 2021-01-09 ENCOUNTER — Encounter: Payer: Self-pay | Admitting: Family Medicine

## 2021-01-09 VITALS — BP 136/83 | HR 71 | Ht 66.0 in | Wt 151.0 lb

## 2021-01-09 DIAGNOSIS — G44229 Chronic tension-type headache, not intractable: Secondary | ICD-10-CM

## 2021-01-09 DIAGNOSIS — F419 Anxiety disorder, unspecified: Secondary | ICD-10-CM

## 2021-01-09 DIAGNOSIS — R519 Headache, unspecified: Secondary | ICD-10-CM | POA: Diagnosis not present

## 2021-01-09 NOTE — Patient Instructions (Addendum)
Below is our plan:  We will discontinue propranolol. Consider sleep consult to look at possible sleep apnea. Consider topiramate for headache management. Please continue discussion of anxiety with PCP. Venlafaxine is also used for headache management and can help with anxiety. We do not usually use this with older patients due to side effects. Please consider psychiatry to help manage anxiety.   Please make sure you are staying well hydrated. I recommend 50-60 ounces daily. Well balanced diet and regular exercise encouraged. Consistent sleep schedule with 6-8 hours recommended.   Please continue follow up with care team as directed.   Follow up with me as needed   You may receive a survey regarding today's visit. I encourage you to leave honest feed back as I do use this information to improve patient care. Thank you for seeing me today!    Topiramate tablets What is this medicine? TOPIRAMATE (toe PYRE a mate) is used to treat seizures in adults or children with epilepsy. It is also used for the prevention of migraine headaches. This medicine may be used for other purposes; ask your health care provider or pharmacist if you have questions. COMMON BRAND NAME(S): Topamax, Topiragen What should I tell my health care provider before I take this medicine? They need to know if you have any of these conditions:  bleeding disorder  kidney disease  lung disease  suicidal thoughts, plans, or attempt  an unusual or allergic reaction to topiramate, other medicines, foods, dyes, or preservatives  pregnant or trying to get pregnant  breast-feeding How should I use this medicine? Take this medicine by mouth with a glass of water. Follow the directions on the prescription label. Do not cut, crush or chew this medicine. Swallow the tablets whole. You can take it with or without food. If it upsets your stomach, take it with food. Take your medicine at regular intervals. Do not take it more often  than directed. Do not stop taking except on your doctor's advice. A special MedGuide will be given to you by the pharmacist with each prescription and refill. Be sure to read this information carefully each time. Talk to your pediatrician regarding the use of this medicine in children. While this drug may be prescribed for children as young as 20 years of age for selected conditions, precautions do apply. Overdosage: If you think you have taken too much of this medicine contact a poison control center or emergency room at once. NOTE: This medicine is only for you. Do not share this medicine with others. What if I miss a dose? If you miss a dose, take it as soon as you can. If your next dose is to be taken in less than 6 hours, then do not take the missed dose. Take the next dose at your regular time. Do not take double or extra doses. What may interact with this medicine? This medicine may interact with the following medications:  acetazolamide  alcohol  antihistamines for allergy, cough, and cold  aspirin and aspirin-like medicines  atropine  birth control pills  certain medicines for anxiety or sleep  certain medicines for bladder problems like oxybutynin, tolterodine  certain medicines for depression like amitriptyline, fluoxetine, sertraline  certain medicines for seizures like carbamazepine, phenobarbital, phenytoin, primidone, valproic acid, zonisamide  certain medicines for stomach problems like dicyclomine, hyoscyamine  certain medicines for travel sickness like scopolamine  certain medicines for Parkinson's disease like benztropine, trihexyphenidyl  certain medicines that treat or prevent blood clots like warfarin, enoxaparin,  dalteparin, apixaban, dabigatran, and rivaroxaban  digoxin  general anesthetics like halothane, isoflurane, methoxyflurane, propofol  hydrochlorothiazide  ipratropium  lithium  medicines that relax muscles for  surgery  metformin  narcotic medicines for pain  NSAIDs, medicines for pain and inflammation, like ibuprofen or naproxen  phenothiazines like chlorpromazine, mesoridazine, prochlorperazine, thioridazine  pioglitazone This list may not describe all possible interactions. Give your health care provider a list of all the medicines, herbs, non-prescription drugs, or dietary supplements you use. Also tell them if you smoke, drink alcohol, or use illegal drugs. Some items may interact with your medicine. What should I watch for while using this medicine? Visit your doctor or health care professional for regular checks on your progress. Tell your health care professional if your symptoms do not start to get better or if they get worse. Do not stop taking except on your health care professional's advice. You may develop a severe reaction. Your health care professional will tell you how much medicine to take. Wear a medical ID bracelet or chain. Carry a card that describes your disease and details of your medicine and dosage times. This medicine can reduce the response of your body to heat or cold. Dress warm in cold weather and stay hydrated in hot weather. If possible, avoid extreme temperatures like saunas, hot tubs, very hot or cold showers, or activities that can cause dehydration such as vigorous exercise. Check with your health care professional if you have severe diarrhea, nausea, and vomiting, or if you sweat a lot. The loss of too much body fluid may make it dangerous for you to take this medicine. You may get drowsy or dizzy. Do not drive, use machinery, or do anything that needs mental alertness until you know how this medicine affects you. Do not stand up or sit up quickly, especially if you are an older patient. This reduces the risk of dizzy or fainting spells. Alcohol may interfere with the effect of this medicine. Avoid alcoholic drinks. Tell your health care professional right away if you  have any change in your eyesight. Patients and their families should watch out for new or worsening depression or thoughts of suicide. Also watch out for sudden changes in feelings such as feeling anxious, agitated, panicky, irritable, hostile, aggressive, impulsive, severely restless, overly excited and hyperactive, or not being able to sleep. If this happens, especially at the beginning of treatment or after a change in dose, call your healthcare professional. This medicine may cause serious skin reactions. They can happen weeks to months after starting the medicine. Contact your health care provider right away if you notice fevers or flu-like symptoms with a rash. The rash may be red or purple and then turn into blisters or peeling of the skin. Or, you might notice a red rash with swelling of the face, lips or lymph nodes in your neck or under your arms. Birth control may not work properly while you are taking this medicine. Talk to your health care professional about using an extra method of birth control. Women should inform their health care professional if they wish to become pregnant or think they might be pregnant. There is a potential for serious side effects and harm to an unborn child. Talk to your health care professional for more information. What side effects may I notice from receiving this medicine? Side effects that you should report to your doctor or health care professional as soon as possible:  allergic reactions like skin rash, itching or hives,  swelling of the face, lips, or tongue  blood in the urine  changes in vision  confusion  loss of memory  pain in lower back or side  pain when urinating  redness, blistering, peeling or loosening of the skin, including inside the mouth  signs and symptoms of bleeding such as bloody or black, tarry stools; red or dark brown urine; spitting up blood or brown material that looks like coffee grounds; red spots on the skin; unusual  bruising or bleeding from the eyes, gums, or nose  signs and symptoms of increased acid in the body like breathing fast; fast heartbeat; headache; confusion; unusually weak or tired; nausea, vomiting  suicidal thoughts, mood changes  trouble speaking or understanding  unusual sweating  unusually weak or tired Side effects that usually do not require medical attention (report to your doctor or health care professional if they continue or are bothersome):  dizziness  drowsiness  fever  loss of appetite  nausea, vomiting  pain, tingling, numbness in the hands or feet  stomach pain  tiredness  upset stomach This list may not describe all possible side effects. Call your doctor for medical advice about side effects. You may report side effects to FDA at 1-800-FDA-1088. Where should I keep my medicine? Keep out of the reach of children and pets. Store between 15 and 30 degrees C (59 and 86 degrees F). Protect from moisture. Keep the container tightly closed. Get rid of any unused medicine after the expiration date. To get rid of medicines that are no longer needed or have expired:  Take the medicine to a medicine take-back program. Check with your pharmacy or law enforcement to find a location.  If you cannot return the medicine, check the label or package insert to see if the medicine should be thrown out in the garbage or flushed down the toilet. If you are not sure, ask your health care provider. If it is safe to put it in the trash, empty the medicine out of the container. Mix the medicine with cat litter, dirt, coffee grounds, or other unwanted substance. Seal the mixture in a bag or container. Put it in the trash. NOTE: This sheet is a summary. It may not cover all possible information. If you have questions about this medicine, talk to your doctor, pharmacist, or health care provider.  2021 Elsevier/Gold Standard (2020-02-29 15:41:57)   Venlafaxine Tablets What is this  medicine? VENLAFAXINE (VEN la fax een) is used to treat depression, anxiety and panic disorder. This medicine may be used for other purposes; ask your health care provider or pharmacist if you have questions. COMMON BRAND NAME(S): Effexor What should I tell my health care provider before I take this medicine? They need to know if you have any of these conditions:  bleeding disorders  glaucoma  heart disease  high blood pressure  high cholesterol  kidney disease  liver disease  low levels of sodium in the blood  mania or bipolar disorder  seizures  suicidal thoughts, plans, or attempt; a previous suicide attempt by you or a family  take medicines that treat or prevent blood clots  thyroid disease  an unusual or allergic reaction to venlafaxine, desvenlafaxine, other medicines, foods, dyes, or preservatives  pregnant or trying to get pregnant  breast-feeding How should I use this medicine? Take this medicine by mouth with a glass of water. Follow the directions on the prescription label. Take it with food. Take your medicine at regular intervals. Do not  take your medicine more often than directed. Do not stop taking this medicine suddenly except upon the advice of your doctor. Stopping this medicine too quickly may cause serious side effects or your condition may worsen. A special MedGuide will be given to you by the pharmacist with each prescription and refill. Be sure to read this information carefully each time. Talk to your pediatrician regarding the use of this medicine in children. Special care may be needed. Overdosage: If you think you have taken too much of this medicine contact a poison control center or emergency room at once. NOTE: This medicine is only for you. Do not share this medicine with others. What if I miss a dose? If you miss a dose, take it as soon as you can. If it is almost time for your next dose, take only that dose. Do not take double or extra  doses. What may interact with this medicine? Do not take this medicine with any of the following medications:  certain medicines for fungal infections like fluconazole, itraconazole, ketoconazole, posaconazole, voriconazole  cisapride  desvenlafaxine  dronedarone  duloxetine  levomilnacipran  linezolid  MAOIs like Carbex, Eldepryl, Marplan, Nardil, and Parnate  methylene blue (injected into a vein)  milnacipran  pimozide  thioridazine This medicine may also interact with the following medications:  amphetamines  aspirin and aspirin-like medicines  certain medicines for depression, anxiety, or psychotic disturbances  certain medicines for migraine headaches like almotriptan, eletriptan, frovatriptan, naratriptan, rizatriptan, sumatriptan, zolmitriptan  certain medicines for sleep  certain medicines that treat or prevent blood clots like dalteparin, enoxaparin, warfarin  cimetidine  clozapine  diuretics  fentanyl  furazolidone  indinavir  isoniazid  lithium  metoprolol  NSAIDS, medicines for pain and inflammation, like ibuprofen or naproxen  other medicines that prolong the QT interval (cause an abnormal heart rhythm) like dofetilide, ziprasidone  procarbazine  rasagiline  supplements like St. John's wort, kava kava, valerian  tramadol  tryptophan This list may not describe all possible interactions. Give your health care provider a list of all the medicines, herbs, non-prescription drugs, or dietary supplements you use. Also tell them if you smoke, drink alcohol, or use illegal drugs. Some items may interact with your medicine. What should I watch for while using this medicine? Tell your doctor if your symptoms do not get better or if they get worse. Visit your doctor or health care professional for regular checks on your progress. Because it may take several weeks to see the full effects of this medicine, it is important to continue your  treatment as prescribed by your doctor. Patients and their families should watch out for new or worsening thoughts of suicide or depression. Also watch out for sudden changes in feelings such as feeling anxious, agitated, panicky, irritable, hostile, aggressive, impulsive, severely restless, overly excited and hyperactive, or not being able to sleep. If this happens, especially at the beginning of treatment or after a change in dose, call your health care professional. This medicine can cause an increase in blood pressure. Check with your doctor for instructions on monitoring your blood pressure while taking this medicine. You may get drowsy or dizzy. Do not drive, use machinery, or do anything that needs mental alertness until you know how this medicine affects you. Do not stand or sit up quickly, especially if you are an older patient. This reduces the risk of dizzy or fainting spells. Alcohol may interfere with the effect of this medicine. Avoid alcoholic drinks. Your mouth may get  dry. Chewing sugarless gum, sucking hard candy and drinking plenty of water will help. Contact your doctor if the problem does not go away or is severe. What side effects may I notice from receiving this medicine? Side effects that you should report to your doctor or health care professional as soon as possible:  allergic reactions like skin rash, itching or hives, swelling of the face, lips, or tongue  anxious  breathing problems  confusion  changes in vision  chest pain  confusion  elevated mood, decreased need for sleep, racing thoughts, impulsive behavior  eye pain  fast, irregular heartbeat  feeling faint or lightheaded, falls  feeling agitated, angry, or irritable  hallucination, loss of contact with reality  high blood pressure  loss of balance or coordination  palpitations  redness, blistering, peeling or loosening of the skin, including inside the mouth  restlessness, pacing, inability  to keep still  seizures  stiff muscles  suicidal thoughts or other mood changes  trouble passing urine or change in the amount of urine  trouble sleeping  unusual bleeding or bruising  unusually weak or tired  vomiting Side effects that usually do not require medical attention (report to your doctor or health care professional if they continue or are bothersome):  change in sex drive or performance  change in appetite or weight  constipation  dizziness  dry mouth  headache  increased sweating  nausea  tired This list may not describe all possible side effects. Call your doctor for medical advice about side effects. You may report side effects to FDA at 1-800-FDA-1088. Where should I keep my medicine? Keep out of the reach of children. Store at a controlled temperature between 20 and 25 degrees C (68 and 77 degrees F), in a dry place. Throw away any unused medicine after the expiration date. NOTE: This sheet is a summary. It may not cover all possible information. If you have questions about this medicine, talk to your doctor, pharmacist, or health care provider.  2021 Elsevier/Gold Standard (2020-07-08 15:15:32)    Tension Headache, Adult A tension headache is a feeling of pain, pressure, or aching in the head. It is often felt over the front and sides of the head. Tension headaches can last from 30 minutes to several days. What are the causes? The cause of this condition is not known. Sometimes, tension headaches are brought on by stress, worry (anxiety), or depression. Other things that may set them off include:  Alcohol.  Too much caffeine or caffeine withdrawal.  Colds, flu, or sinus infections.  Dental problems. This can include clenching your teeth.  Being tired.  Holding your head and neck in the same position for a long time, such as while using a computer.  Smoking.  Arthritis in the neck. What are the signs or symptoms?  Feeling pressure  around the head.  A dull ache in the head.  Pain over the front and sides of the head.  Feeling sore or tender in the muscles of the head, neck, and shoulders. How is this treated? This condition may be treated with lifestyle changes and with medicines that help relieve symptoms. Follow these instructions at home: Managing pain  Take over-the-counter and prescription medicines only as told by your doctor.  When you have a headache, lie down in a dark, quiet room.  If told, put ice on your head and neck. To do this: ? Put ice in a plastic bag. ? Place a towel between your skin  and the bag. ? Leave the ice on for 20 minutes, 2-3 times a day. ? Take off the ice if your skin turns bright red. This is very important. If you cannot feel pain, heat, or cold, you have a greater risk of damage to the area.  If told, put heat on the back of your neck. Do this as often as told by your doctor. Use the heat source that your doctor recommends, such as a moist heat pack or a heating pad. ? Place a towel between your skin and the heat source. ? Leave the heat on for 20-30 minutes. ? Take off the heat if your skin turns bright red. This is very important. If you cannot feel pain, heat, or cold, you have a greater risk of getting burned. Eating and drinking  Eat meals on a regular schedule.  If you drink alcohol: ? Limit how much you have to:  0-1 drink a day for women who are not pregnant.  0-2 drinks a day for men. ? Know how much alcohol is in your drink. In the [place] one drink equals one 12 oz bottle of beer (355 mL), one 5 oz glass of wine (148 mL), or one 1 oz glass of hard liquor (44 mL).  Drink enough fluid to keep your pee (urine) pale yellow.  Do not use a lot of caffeine, or stop using caffeine. Lifestyle  Get 7-9 hours of sleep each night. Or get the amount of sleep that your doctor tells you to.  At bedtime, keep computers, phones, and tablets out of your room.  Find ways  to lessen your stress. This may include: ? Exercise. ? Deep breathing. ? Yoga. ? Listening to music. ? Thinking positive thoughts.  Sit up straight. Try to relax your muscles.  Do not smoke or use any products that contain nicotine or tobacco. If you need help quitting, ask your doctor. General instructions  Avoid things that can bring on headaches. Keep a headache journal to see what may bring on headaches. For example, write down: ? What you eat and drink. ? How much sleep you get. ? Any change to your diet or medicines.  Keep all follow-up visits.   Contact a doctor if:  Your headache does not get better.  Your headache comes back.  You have a headache, and sounds, light, or smells bother you.  You feel like you may vomit, or you vomit.  Your stomach hurts. Get help right away if:  You all of a sudden get a very bad headache with any of these things: ? A stiff neck. ? Feeling like you may vomit. ? Vomiting. ? Feeling mixed up (confused). ? Feeling weak in one part or one side of your body. ? Having trouble seeing or speaking, or both. ? Feeling short of breath. ? A rash. ? Feeling very sleepy. ? Pain in your eye or ear. ? Trouble walking or balancing. ? Feeling like you will faint, or you faint. Summary  A tension headache is pain, pressure, or aching in your head.  Tension headaches can last from 30 minutes to several days.  Lifestyle changes and medicines may help relieve pain. This information is not intended to replace advice given to you by your health care provider. Make sure you discuss any questions you have with your health care provider. Document Revised: 05/16/2020 Document Reviewed: 05/16/2020 Elsevier Patient Education  2021 [employer].

## 2021-01-09 NOTE — Progress Notes (Addendum)
Chief Complaint  Patient presents with  . Follow-up    RM 1 alone Pt is well, headaches have not changed, has them just about everyday all day.      HISTORY OF PRESENT ILLNESS: 01/09/21 ALL:  He returns for follow up for headaches. We started propranolol in 10/2020. He was concerned about increased sleepiness and dose was switched to ER. He called the office yesterday with concerns of cold hands and feet and reported PCP wanted him to stop propranolol for concerns of Raynaud's. Headaches were unchanged and no improvement noted. He has had more anxiety. PCP recommended he see psychiatry. He reports that his wife feels that he is a hypochondriac. He was recently started on Ambien 5mg  due to insomnia. He has tolerated well for the past month. It is unclear if it is helping. He feels that he has trouble remembering things over the past few months. He has been seen by rheumatology, ophthalmology, cardiology for various concerns. All workup has been unremarkable.    10/30/2020 ALL:  Aaron [name] is a 70 y.o. male here today for follow up for headaches. He was seen by Dr Jaynee Eagles  07/2019 and 09/2019. MRI was normal. Headaches were thought to be from clenching. He had used mouth guard and flexeril which helped. Sleep study also recommended but was not pursued.  He had a CT with PCP 10/2020 following an accident at home where he hit his head. CT was normal. He reports that headaches continue. Headache is constant. Pain is usually on the top of his head. No migraine symptoms. Relieved when he pushes on temples. His wife says he does not snore. He has lost 40 pounds. He takes meloxicam daily for back pain. He has taken Excedrin PM in the past but not since starting meloxicam. He has not been on prevention medications. He is followed by PCP for intermittent dizziness. Previous cardiology workup normal.    HISTORY (copied from Dr Cathren Laine previous note)  Interval history 07/11/2020: He saw the dentist and he  is clenching and grinding at night, he is going to get a mouthguard. No changes since he has been seen. He is still waking up with the headache. Will add Flexeril at bedtime for teeth clenching. We discussed a plan.  HPI:  Aaron [name] is a 70 y.o. male here as requested by [name], * for persistent headache as well as headaches and myalgias.  I reviewed Dr. Barry Brunner notes.  Patient has muscle pain that started 2 weeks prior to last appointment which was May 29, 2019.  He has a past medical history of diabetes on Metformin and glipizide, hypertension on Norvasc 5 mg, hyperlipidemia on a statin.  Symptoms included upper and lower extremity muscle pain, no injuries, and "stress headaches" 3-4 times weekly this started in January.  No history of migraine headaches.  No other associated symptoms, no abdominal pain, chest pain, fever, nausea, rash, shortness of breath or vomiting, coughing, dizziness, fever.  Examination was normal including physical examination and neurologic exam was nonfocal.  CBC 1 month ago showed elevated white blood cells.  Hemoglobin A1c 6.39-month ago, 6 months ago was 8.7.  CMP unremarkable.  He is here alone. One day a few months ago he had a headache. It was consistent, about March or April. He went to urgent care, neg for covid19, he was started on fioricet and it did not help. No hx of migraines. Excedrin did not help. Feels like he is wearing  a hat that is on tight. It comes and goes. He notices it in the morning it gets better when he gets to work. Or with laying in bed. No throbbing. He rarely had headaches in the past in the temples but this is different. Can last for hours. Hasn't progressed or improved. Coffee maybe helps. Unknown triggers. Annoying not excruciating. No vision problems or vision changes. No hearing changes. He gets muscle aches in the arms, no weakness, started prior to the headache, he has some cervical neck pain, no radicular  symptoms, he has low back pain that is long-term and stable. He has some numbness and tingling in his feet but not frequently. He denies snoring or excessive fatigue during the day, he doesn't complain about not feeling refreshed. No associated light or sound sensitivity, no migraines. He stopped the statin in September still symptomatic. No fevers. Some stiffness in the neck but no deficits of range of motion.   Reviewed notes, labs and imaging from outside physicians, which showed:   Reviewed XR cervical spine images and agree: INGS: Vertebral body height and alignment are maintained. Loss of disc space height and endplate spurring are seen at C5-6. The C7-T1 level is not visualized has no swimmer's view is provided.  IMPRESSION: C5-6 degenerative disc disease.   REVIEW OF SYSTEMS: Out of a complete 14 system review of symptoms, the patient complains only of the following symptoms, headaches, dizziness and all other reviewed systems are negative.    ALLERGIES: Allergies  Allergen Reactions  . Erythromycin Itching  . Levaquin [Levofloxacin In D5w]     Light sensitivity      HOME MEDICATIONS: Outpatient Medications Prior to Visit  Medication Sig Dispense Refill  . amLODipine (NORVASC) 2.5 MG tablet Take 1 tablet (2.5 mg total) by mouth daily. 90 tablet 3  . glipiZIDE (GLUCOTROL) 5 MG tablet TAKE 1 TABLET(5 MG) BY MOUTH DAILY BEFORE BREAKFAST 90 tablet 1  . metFORMIN (GLUCOPHAGE-XR) 500 MG 24 hr tablet TAKE 1 TABLET BY MOUTH DAILY AT 8 PM 180 tablet 0  . Multiple Vitamin (MULTI-VITAMIN PO) Take by mouth daily.    . rosuvastatin (CRESTOR) 10 MG tablet Take 1 tablet (10 mg total) by mouth daily. 90 tablet 3  . triamcinolone (KENALOG) 0.1 % Apply 1 application topically 2 (two) times daily. 30 g 3  . vitamin B-12 (CYANOCOBALAMIN) 500 MCG tablet Take 500 mcg by mouth daily.    Marland Kitchen zolpidem (AMBIEN) 5 MG tablet Take 1 tablet (5 mg total) by mouth at bedtime as needed for sleep. 15  tablet 2  . propranolol ER (INDERAL LA) 60 MG 24 hr capsule Take 1 capsule (60 mg total) by mouth at bedtime. 90 capsule 1  . melatonin 5 MG TABS Take 5 mg by mouth. (Patient not taking: No sig reported)    . meloxicam (MOBIC) 7.5 MG tablet Take 1 tablet (7.5 mg total) by mouth daily. (Patient not taking: Reported on 01/08/2021) 30 tablet 0   No facility-administered medications prior to visit.     PAST MEDICAL HISTORY: Past Medical History:  Diagnosis Date  . Diabetes mellitus without complication ([place])   . GERD (gastroesophageal reflux disease)   . Hyperlipidemia   . Hypertension   . Psoriasis      PAST SURGICAL HISTORY: History reviewed. No pertinent surgical history.   FAMILY HISTORY: Family History  Problem Relation Age of Onset  . Cancer Mother        breast ca spread to bone  .  Cancer Father 70       brain cancer  . Migraines Neg Hx   . Headache Neg Hx      SOCIAL HISTORY: Social History   Socioeconomic History  . Marital status: Married    Spouse name: Not on file  . Number of children: 0  . Years of education: Not on file  . Highest education level: High school graduate  Occupational History  . Occupation: Press photographer, retired   Tobacco Use  . Smoking status: Former Smoker    Types: Cigarettes    Quit date: 1985    Years since quitting: 37.3  . Smokeless tobacco: Never Used  Vaping Use  . Vaping Use: Never used  Substance and Sexual Activity  . Alcohol use: Yes    Alcohol/week: 7.0 standard drinks    Types: 7 Cans of beer per week    Comment: 1 beer with dinner usually daily  . Drug use: No  . Sexual activity: Yes  Other Topics Concern  . Not on file  Social History Narrative   Marital status:  Married.             Children:   none      Lives:      Employment:  Press photographer      Tobacco:  none      Alcohol:  About 7 drinks per week      Drugs:   none      Exercise:      Seatbelt:      Education: [employer].      Lives at home with wife    Right handed   Caffeine: 2 cups/day   Social Determinants of Health   Financial Resource Strain: Not on file  Food Insecurity: Not on file  Transportation Needs: Not on file  Physical Activity: Not on file  Stress: Not on file  Social Connections: Not on file  Intimate Partner Violence: Not on file      PHYSICAL EXAM  Vitals:   01/09/21 0921  BP: 136/83  Pulse: 71  Weight: 151 lb (68.5 kg)  Height: 5\' 6"  (1.676 m)   Body mass index is 24.37 kg/m.   Generalized: Well developed, in no acute distress  Cardiology: normal rate and rhythm, no murmur auscultated  Respiratory: clear to auscultation bilaterally    Neurological examination  Mentation: Alert oriented to time, place, history taking. Follows all commands speech and language fluent Cranial nerve II-XII: Pupils were equal round reactive to light. Extraocular movements were full, visual field were full on confrontational test. Facial sensation and strength were normal. Head turning and shoulder shrug  were normal and symmetric. Motor: The motor testing reveals 5 over 5 strength of all 4 extremities. Good symmetric motor tone is noted throughout.  Gait and station: Gait is normal.     DIAGNOSTIC DATA (LABS, IMAGING, TESTING) - I reviewed patient records, labs, notes, testing and imaging myself where available.  Lab Results  Component Value Date   WBC 6.3 12/17/2020   HGB 16.1 12/17/2020   HCT 49.1 12/17/2020   MCV 85.3 12/17/2020   PLT 292.0 12/17/2020      Component Value Date/Time   NA 138 12/17/2020 1400   NA 141 10/15/2020 1325   K 3.8 12/17/2020 1400   CL 102 12/17/2020 1400   CO2 29 12/17/2020 1400   GLUCOSE 78 12/17/2020 1400   BUN 16 12/17/2020 1400   BUN 13 10/15/2020 1325   CREATININE 0.76 12/17/2020 1400  CREATININE 0.71 04/29/2015 1339   CALCIUM 9.4 12/17/2020 1400   PROT 7.1 12/17/2020 1400   PROT 6.8 10/15/2020 1325   ALBUMIN 4.1 12/17/2020 1400   ALBUMIN 4.2 10/15/2020 1325   AST  16 12/17/2020 1400   ALT 16 12/17/2020 1400   ALKPHOS 68 12/17/2020 1400   BILITOT 0.6 12/17/2020 1400   BILITOT 0.4 10/15/2020 1325   GFRNONAA 99 10/15/2020 1325   GFRNONAA >89 04/29/2015 1339   GFRAA 115 10/15/2020 1325   GFRAA >89 04/29/2015 1339   Lab Results  Component Value Date   CHOL 135 10/15/2020   HDL 48 10/15/2020   LDLCALC 71 10/15/2020   TRIG 84 10/15/2020   CHOLHDL 2.8 10/15/2020   Lab Results  Component Value Date   HGBA1C 5.7 (H) 10/15/2020   Lab Results  Component Value Date   VITAMINB12 331 06/12/2020   Lab Results  Component Value Date   TSH 2.750 04/30/2020    No flowsheet data found.   No flowsheet data found.   ASSESSMENT AND PLAN  70 y.o. year old male  has a past medical history of Diabetes mellitus without complication ([place]), GERD (gastroesophageal reflux disease), Hyperlipidemia, Hypertension, and Psoriasis. here with   Chronic tension-type headache, not intractable  Morning headache  Anxiety  R[place] continues to have chronic tension style headaches. Propranolol was not effective and he reports side effects of fatigue and cold hands/feet. We will discontinue propranolol. I have recommended he consider topiramate. He is very hesitant and asks multiple questions about other possible causes of headaches including toxic exposures, recent prescription change in eye glasses. If he chooses, we will start 50mg  daily. May increase to 100mg  daily if well tolerated. He was advised of possible side effects. We have also discussed venlafaxine as an option to manage anxiety and headaches. I have expressed concerns of side effects and request that he work closely with Dr Franky Macho for anxiety management. We have also discussed consider sleep study. He does not snore. He does have morning headaches and dry mouth. No other red flag warnings for sleep apnea. ESS not completed today due to time but will provide online if he wishes. Healthy lifestyle habits  encouraged. He will continue close follow up with PCP for dizziness. He will follow up with me pending decision to start headache prevention medication.     No orders of the defined types were placed in this encounter.    No orders of the defined types were placed in this encounter.     I spent 30 minutes of face-to-face and non-face-to-face time with patient.  This included previsit chart review, lab review, study review, order entry, electronic health record documentation, patient education.    Debbora Presto, MSN, FNP-C 01/09/2021, 10:15 AM  [facility] [address], [place], [place] [address] [phone]   agree with assessment and plan as stated.     Sarina Ill, MD [facility]

## 2021-01-13 ENCOUNTER — Encounter: Payer: Self-pay | Admitting: Emergency Medicine

## 2021-01-27 ENCOUNTER — Other Ambulatory Visit: Payer: Self-pay | Admitting: Emergency Medicine

## 2021-01-27 DIAGNOSIS — F32A Depression, unspecified: Secondary | ICD-10-CM

## 2021-01-27 MED ORDER — ESCITALOPRAM OXALATE 10 MG PO TABS: 10.0000 mg | ORAL_TABLET | Freq: Every day | ORAL | 1 refills | Status: DC

## 2021-01-27 NOTE — Telephone Encounter (Signed)
Not sure what happened forwarding this message but this is an unacceptable delay. Dr. Kittie Plater

## 2021-02-04 ENCOUNTER — Telehealth: Payer: Self-pay | Admitting: Emergency Medicine

## 2021-02-04 NOTE — Telephone Encounter (Signed)
Patient is requesting a call back in regards to a behavioral health outpatient referral. He can be reached at [phone]. Please advise

## 2021-02-05 NOTE — Telephone Encounter (Signed)
   Patient calling to report he was called by [facility], they can not honor referral. They have a age cut off limit of 70 years old   Please make new referral

## 2021-02-06 NOTE — Telephone Encounter (Signed)
Spoke to patient because he states Aaron [name] told him they do not accept new patient. Also, I spoke to [facility] in referrals and she stated the same. She advised me to let the patient know to call his insurance to find out who is on his plan. I advised patient.

## 2021-02-16 IMAGING — DX DG RIBS W/ CHEST 3+V*R*
4 series · 4 of 4 positions shown · non-contrast
Comparison: Chest x-ray 05/14/2018

CLINICAL DATA: Right posterior chest wall pain.

EXAM:
RIGHT RIBS AND CHEST - 3+ VIEW

[chest pa]
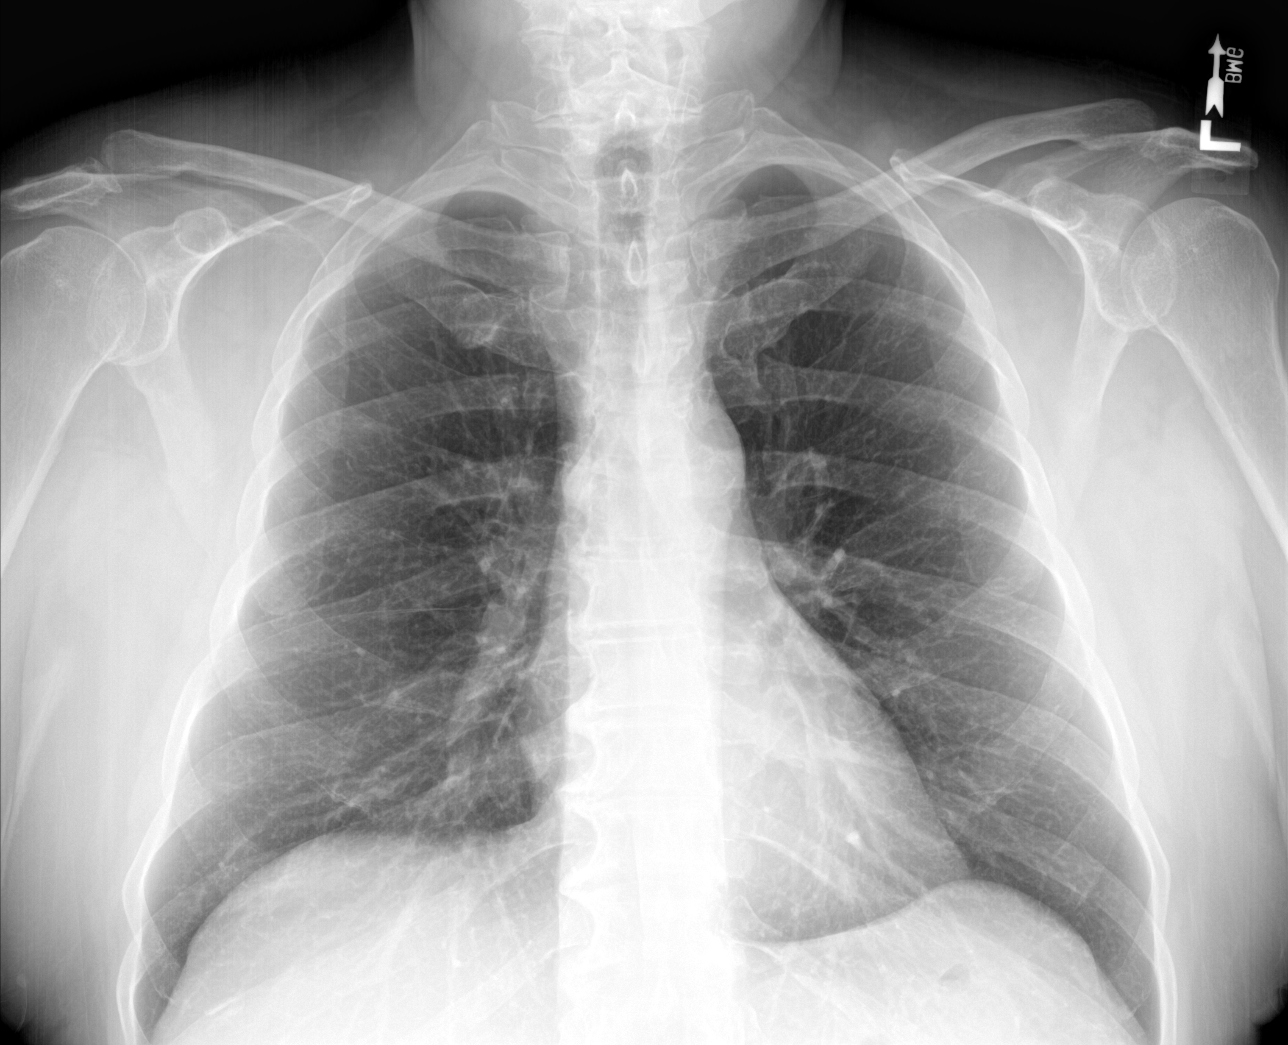

[rib obl (1 of 2)]
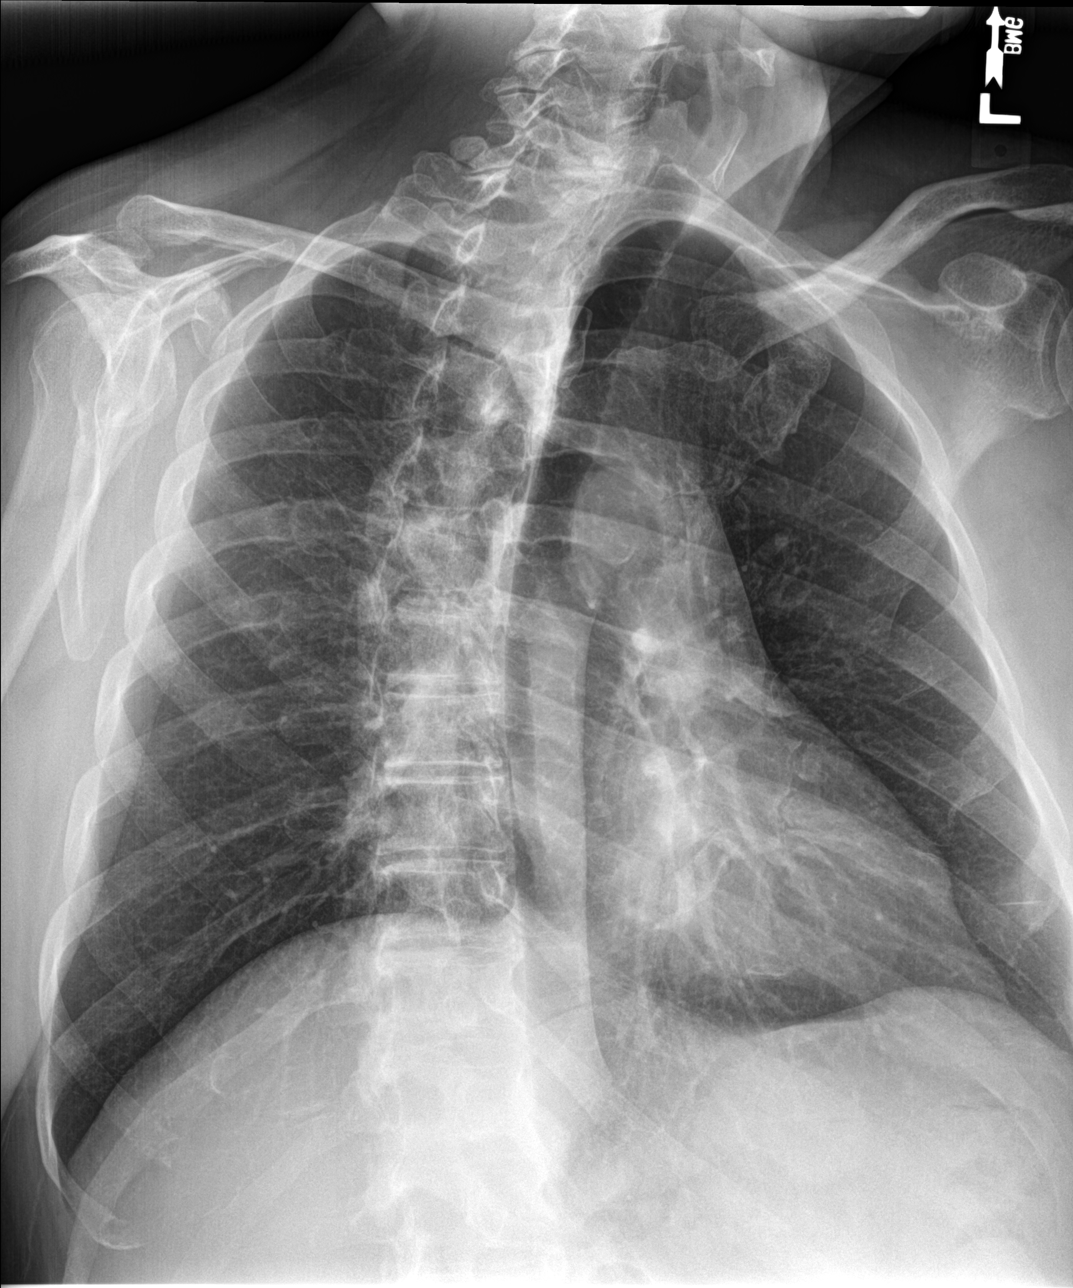

[rib obl (2 of 2)]
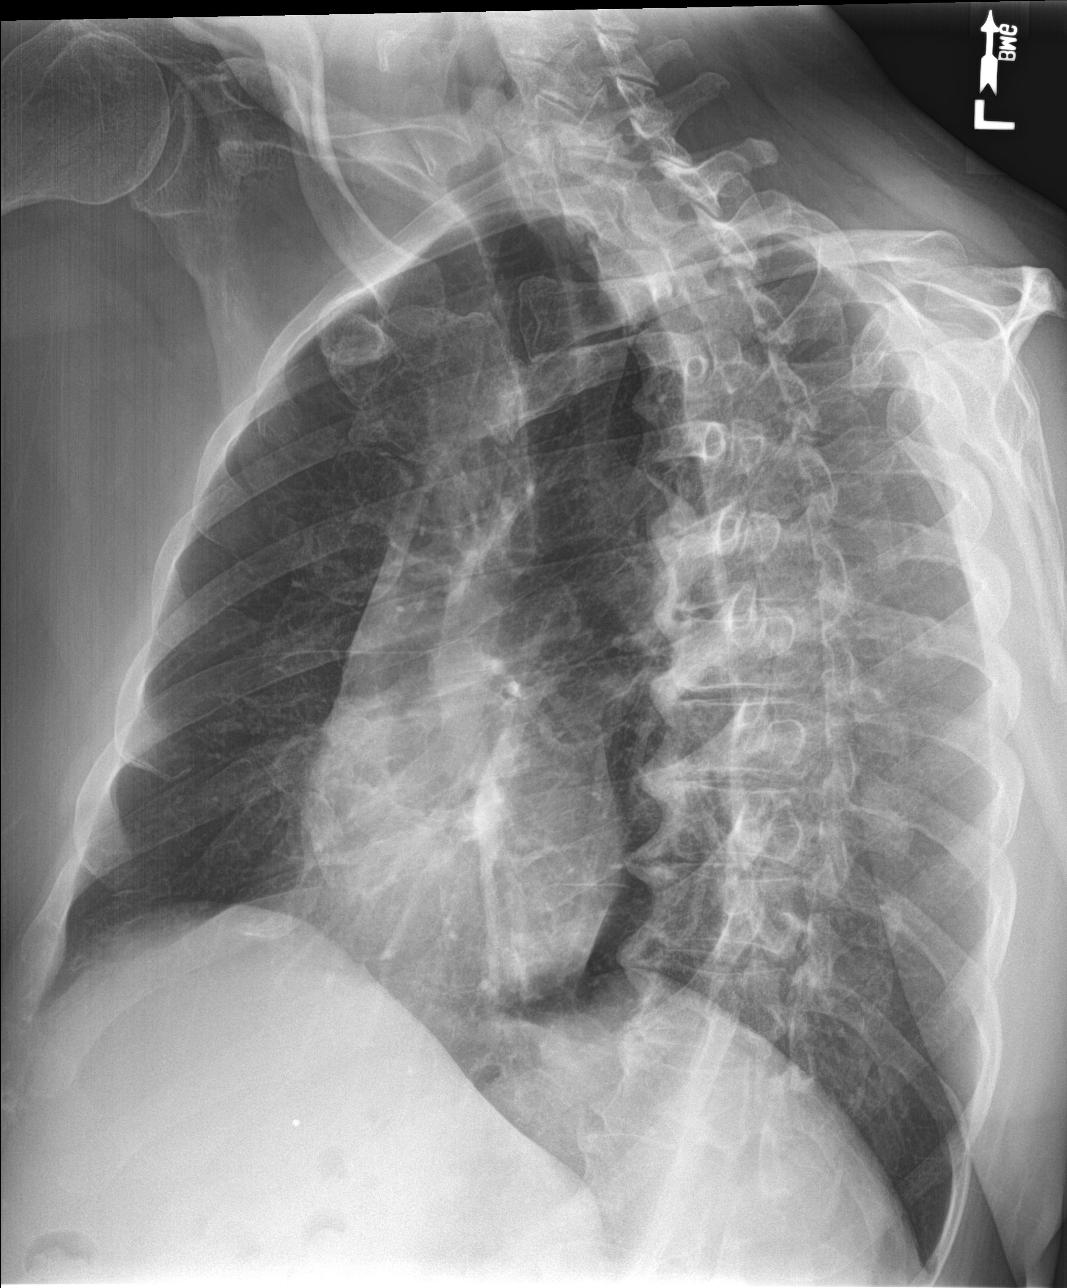

[chest ap]
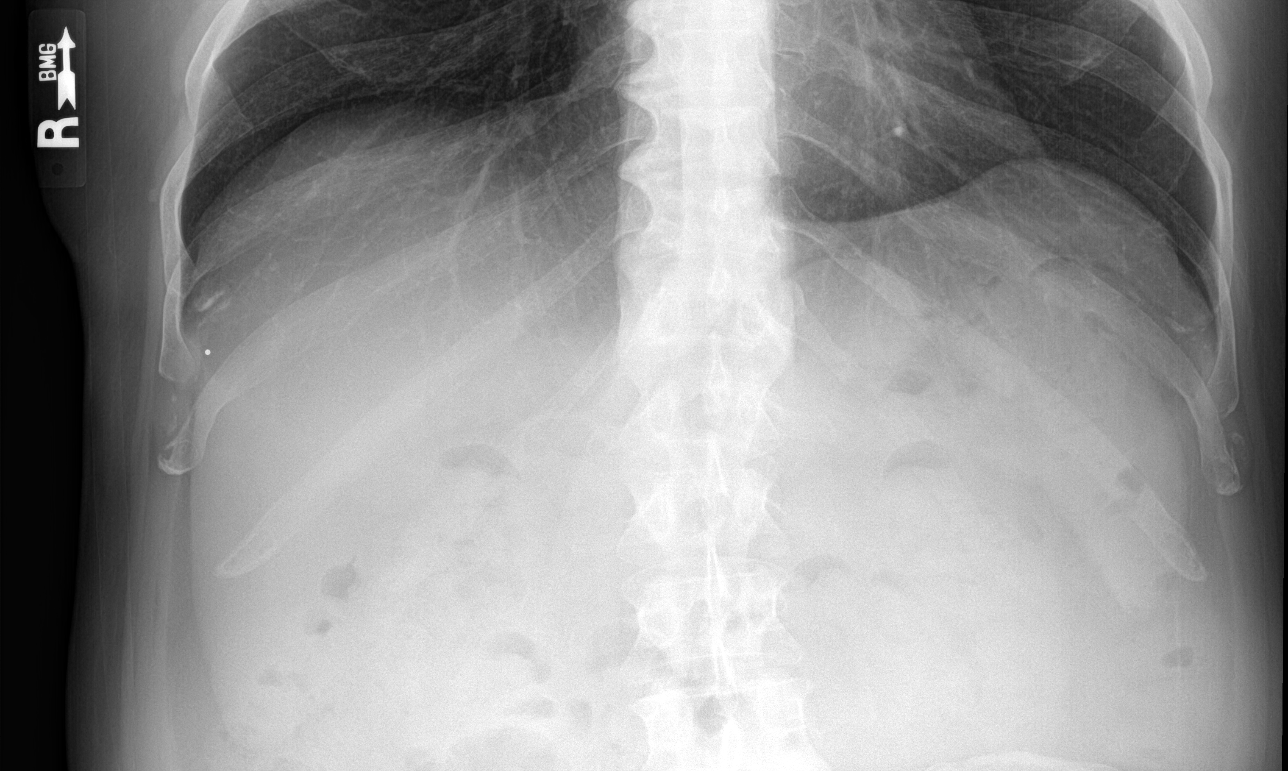

[4 of 4 positions shown; findings below may reference images not displayed]

FINDINGS: Frontal view of the chest shows normal size of the cardiopericardial
silhouette. The lungs are clear without focal pneumonia, edema,
pneumothorax or pleural effusion. The visualized bony structures of
the thorax are intact.

Oblique views of the right ribs show no evidence for rib fracture.
No worrisome lytic or sclerotic rib abnormality by x-ray.
IMPRESSION: Negative.

## 2021-02-17 ENCOUNTER — Telehealth: Payer: Self-pay | Admitting: Emergency Medicine

## 2021-02-17 NOTE — Telephone Encounter (Signed)
LVM for pt to rtn my call to schedule AWV with [facility]. Please schedule AWV if pt calls the office  

## 2021-02-26 ENCOUNTER — Other Ambulatory Visit: Payer: Self-pay

## 2021-02-26 ENCOUNTER — Encounter (HOSPITAL_COMMUNITY): Payer: Self-pay

## 2021-02-26 ENCOUNTER — Ambulatory Visit (HOSPITAL_COMMUNITY)
Admission: EM | Admit: 2021-02-26 | Discharge: 2021-02-26 | Disposition: A | Discharge status: Home / Self Care | Payer: Medicare Other | Attending: Emergency Medicine | Admitting: Emergency Medicine

## 2021-02-26 DIAGNOSIS — S39012A Strain of muscle, fascia and tendon of lower back, initial encounter: Secondary | ICD-10-CM

## 2021-02-26 MED ORDER — IBUPROFEN 800 MG PO TABS: 800.0000 mg | ORAL_TABLET | Freq: Three times a day (TID) | ORAL | 0 refills | Status: DC

## 2021-02-26 NOTE — Discharge Instructions (Addendum)
Take the Ibuprofen three times a day for the next 5-7 days and then as needed.    Rest as much as possible Ice for 10-15 minutes every 4-6 hours as needed for pain and swelling Compression- use an ace bandage or splint for comfort Elevate above your hip/heart when sitting and laying down  Follow up with your primary care provider if symptoms do not improve in the next few days.

## 2021-02-26 NOTE — ED Triage Notes (Signed)
Pt presents with lower back pain that radiates into hips & legs; pt also complains of generalized body aches since yesterday.

## 2021-02-26 NOTE — ED Provider Notes (Signed)
[place]    CSN: [id number] Arrival date & time: 02/26/21  1530      History   Chief Complaint Chief Complaint  Patient presents with   Generalized Body Aches   Back Pain    HPI Aaron [name] is a 70 y.o. male.   Patient here for evaluation of lower back pain that radiates down into legs that started yesterday.  Also reports having some chills and body aches.  Denies any similar symptoms in the past.  Denies any dysuria, urgency, or frequency.  Reports taking Tylenol which has helped with symptoms.  Denies any loss of bowel or bladder control.  Denies any trauma, injury, or other precipitating event.  Denies any fevers, chest pain, shortness of breath, N/V/D, numbness, tingling, weakness, abdominal pain, or headaches.    The history is provided by the patient.  Back Pain  Past Medical History:  Diagnosis Date   Diabetes mellitus without complication ([facility])    GERD (gastroesophageal reflux disease)    Hyperlipidemia    Hypertension    Psoriasis     Patient Active Problem List   Diagnosis Date Noted   Chronic anxiety 01/08/2021   Raynaud's phenomenon without gangrene 01/08/2021   Rash and nonspecific skin eruption 12/23/2020   Cold finger without peripheral vascular disease 10/01/2020   Other psoriasis 10/01/2020   Coronary artery calcification 06/12/2020   History of palpitations 06/12/2020   Tingling of both feet 06/12/2020   Erythrocytosis 05/08/2020   Weight loss, non-intentional 05/08/2020   Night sweats 05/08/2020   Hypertension associated with diabetes ([place]) 05/29/2019   Gastroesophageal reflux disease without esophagitis 03/13/2017   Dyslipidemia associated with type 2 diabetes mellitus ([place]) 03/13/2017    History reviewed. No pertinent surgical history.     Home Medications    Prior to Admission medications   Medication Sig Start Date End Date Taking? Authorizing Provider  ibuprofen (ADVIL) 800 MG tablet Take 1 tablet (800 mg total)  by mouth 3 (three) times daily. 02/26/21  Yes Pearson Forster, NP  amLODipine (NORVASC) 2.5 MG tablet Take 1 tablet (2.5 mg total) by mouth daily. 07/05/20   Minus Breeding, MD  escitalopram (LEXAPRO) 10 MG tablet Take 1 tablet (10 mg total) by mouth daily. 01/27/21 04/27/21  [name], MD  glipiZIDE (GLUCOTROL) 5 MG tablet TAKE 1 TABLET(5 MG) BY MOUTH DAILY BEFORE BREAKFAST 12/21/20   [name], MD  metFORMIN (GLUCOPHAGE-XR) 500 MG 24 hr tablet TAKE 1 TABLET BY MOUTH DAILY AT 8 PM 11/26/20   [name], MD  Multiple Vitamin (MULTI-VITAMIN PO) Take by mouth daily.    [provider]  rosuvastatin (CRESTOR) 10 MG tablet Take 1 tablet (10 mg total) by mouth daily. 06/12/20   [name], MD  triamcinolone (KENALOG) 0.1 % Apply 1 application topically 2 (two) times daily. 11/12/20   [name], MD  vitamin B-12 (CYANOCOBALAMIN) 500 MCG tablet Take 500 mcg by mouth daily.    [provider]  zolpidem (AMBIEN) 5 MG tablet Take 1 tablet (5 mg total) by mouth at bedtime as needed for sleep. 12/11/20   [name], MD    Family History Family History  Problem Relation Age of Onset   Cancer Mother        breast ca spread to bone   Cancer Father 58       brain cancer   Migraines Neg Hx    Headache Neg Hx  Social History Social History   Tobacco Use   Smoking status: Former    Pack years: 0.00    Types: Cigarettes    Quit date: 1985    Years since quitting: 37.5   Smokeless tobacco: Never  Vaping Use   Vaping Use: Never used  Substance Use Topics   Alcohol use: Yes    Alcohol/week: 7.0 standard drinks    Types: 7 Cans of beer per week    Comment: 1 beer with dinner usually daily   Drug use: No     Allergies   Erythromycin and Levaquin [levofloxacin in d5w]   Review of Systems Review of Systems  Musculoskeletal:  Positive for back pain.  All other systems reviewed and are negative.   Physical  Exam Triage Vital Signs ED Triage Vitals  Enc Vitals Group     BP 02/26/21 1618 131/72     Pulse Rate 02/26/21 1618 97     Resp 02/26/21 1618 18     Temp 02/26/21 1618 99.8 F (37.7 C)     Temp Source 02/26/21 1618 Oral     SpO2 02/26/21 1618 100 %     Weight --      Height --      Head Circumference --      Peak Flow --      Pain Score 02/26/21 1619 6     Pain Loc --      Pain Edu? --      Excl. in [place]? --    No data found.  Updated Vital Signs BP 131/72 (BP Location: Right Arm)   Pulse 97   Temp 99.8 F (37.7 C) (Oral)   Resp 18   SpO2 100%   Visual Acuity Right Eye Distance:   Left Eye Distance:   Bilateral Distance:    Right Eye Near:   Left Eye Near:    Bilateral Near:     Physical Exam Vitals and nursing note reviewed.  Constitutional:      General: He is not in acute distress.    Appearance: Normal appearance. He is not ill-appearing, toxic-appearing or diaphoretic.  HENT:     Head: Normocephalic and atraumatic.  Eyes:     Conjunctiva/sclera: Conjunctivae normal.  Cardiovascular:     Rate and Rhythm: Normal rate.     Pulses: Normal pulses.  Pulmonary:     Effort: Pulmonary effort is normal.  Abdominal:     General: Abdomen is flat.  Musculoskeletal:     Cervical back: Normal range of motion.     Lumbar back: No swelling, signs of trauma, tenderness or bony tenderness. Normal range of motion. Positive right straight leg raise test.  Skin:    General: Skin is warm and dry.  Neurological:     General: No focal deficit present.     Mental Status: He is alert and oriented to person, place, and time.  Psychiatric:        Mood and Affect: Mood normal.     UC Treatments / Results  Labs (all labs ordered are listed, but only abnormal results are displayed) Labs Reviewed - No data to display  EKG   Radiology No results found.  Procedures Procedures (including critical care time)  Medications Ordered in UC Medications - No data to  display  Initial Impression / Assessment and Plan / UC Course  I have reviewed the triage vital signs and the nursing notes.  Pertinent labs & imaging results that were available during  my care of the patient were reviewed by me and considered in my medical decision making (see chart for details).    Assessment negative for red flags or concerns.  This is likely strain of lumbar region versus sciatica.  Will treat with ibuprofen next 5 to 7 days as needed.  Encourage fluids and rest.  Recommend ice and heat for comfort.  Patient given gentle stretching and exercises that can help with pain.  Follow-up with primary care if symptoms do not improve in the next few days. Final Clinical Impressions(s) / UC Diagnoses   Final diagnoses:  Strain of lumbar region, initial encounter     Discharge Instructions      Take the Ibuprofen three times a day for the next 5-7 days and then as needed.    Rest as much as possible Ice for 10-15 minutes every 4-6 hours as needed for pain and swelling Compression- use an ace bandage or splint for comfort Elevate above your hip/heart when sitting and laying down  Follow up with your primary care provider if symptoms do not improve in the next few days.      ED Prescriptions     Medication Sig Dispense Auth. Provider   ibuprofen (ADVIL) 800 MG tablet Take 1 tablet (800 mg total) by mouth 3 (three) times daily. 21 tablet Pearson Forster, NP      PDMP not reviewed this encounter.   Pearson Forster, NP 02/26/21 [phone]

## 2021-02-27 ENCOUNTER — Ambulatory Visit: Payer: Medicare Other | Attending: Internal Medicine

## 2021-02-27 ENCOUNTER — Telehealth: Payer: Self-pay | Admitting: Emergency Medicine

## 2021-02-27 ENCOUNTER — Telehealth (HOSPITAL_COMMUNITY): Payer: Self-pay

## 2021-02-27 ENCOUNTER — Encounter: Payer: Self-pay | Admitting: Emergency Medicine

## 2021-02-27 DIAGNOSIS — Z20822 Contact with and (suspected) exposure to covid-19: Secondary | ICD-10-CM | POA: Diagnosis not present

## 2021-02-27 NOTE — Telephone Encounter (Signed)
Called back asking if safe to take ibuprofen w/ otc robitussin. Confirmed with Caroll Rancher NP who states that these medications are safe to take, pt notified and verbalized understanding.

## 2021-02-27 NOTE — Telephone Encounter (Signed)
[facility] FYI:  ---Caller has sore throat and achy feeling since yesterday. He also has sniffles and a cough.  Advised to see PCP within 4 hours. I have contacted the pt to make a virtual appointment at our [place] office. Pt has denied the appt and stated his is scheduled to get a covid test this morning and he will call back if he feels he needs to be seen.

## 2021-02-28 ENCOUNTER — Ambulatory Visit: Payer: Self-pay

## 2021-02-28 ENCOUNTER — Telehealth (INDEPENDENT_AMBULATORY_CARE_PROVIDER_SITE_OTHER): Payer: Medicare Other | Admitting: Internal Medicine

## 2021-02-28 ENCOUNTER — Encounter: Payer: Self-pay | Admitting: Internal Medicine

## 2021-02-28 ENCOUNTER — Other Ambulatory Visit: Payer: Self-pay | Admitting: Internal Medicine

## 2021-02-28 DIAGNOSIS — F419 Anxiety disorder, unspecified: Secondary | ICD-10-CM

## 2021-02-28 DIAGNOSIS — J069 Acute upper respiratory infection, unspecified: Secondary | ICD-10-CM

## 2021-02-28 DIAGNOSIS — E1165 Type 2 diabetes mellitus with hyperglycemia: Secondary | ICD-10-CM

## 2021-02-28 LAB — NOVEL CORONAVIRUS, NAA: SARS-CoV-2, NAA: DETECTED — AB

## 2021-02-28 LAB — SARS-COV-2, NAA 2 DAY TAT

## 2021-02-28 MED ORDER — TRAMADOL HCL 50 MG PO TABS
50.0000 mg | ORAL_TABLET | Freq: Four times a day (QID) | ORAL | 0 refills | Status: DC | PRN
Start: 2021-02-28 — End: 2021-08-15

## 2021-02-28 MED ORDER — HYDROCODONE BIT-HOMATROP MBR 5-1.5 MG/5ML PO SOLN: 5.0000 mL | Freq: Four times a day (QID) | ORAL | 0 refills | Status: AC | PRN

## 2021-02-28 MED ORDER — NIRMATRELVIR/RITONAVIR (PAXLOVID)TABLET: 3.0000 | ORAL_TABLET | Freq: Two times a day (BID) | ORAL | 0 refills | Status: AC

## 2021-02-28 MED ORDER — AZITHROMYCIN 250 MG PO TABS: ORAL_TABLET | ORAL | 0 refills | Status: AC

## 2021-02-28 NOTE — Telephone Encounter (Signed)
Patient seeking advice  Patient calling to report he is [facility]  [place], [place]

## 2021-02-28 NOTE — Telephone Encounter (Signed)
Ok at this point since we know about the covid to NOT take the zpack, thanks

## 2021-02-28 NOTE — Telephone Encounter (Signed)
See MD message to patient.

## 2021-02-28 NOTE — Telephone Encounter (Signed)
Pt had video visit today with Dr. Jenny Reichmann to discuss symptoms.

## 2021-02-28 NOTE — Telephone Encounter (Signed)
This has all been addressed, thanks

## 2021-02-28 NOTE — Progress Notes (Addendum)
Patient ID: Aaron [name], male   DOB: Feb 06, 1951, 70 y.o.   MRN: [id number] Virtual Visit via Video Note  I connected with Aaron [name] on February 28 2021 at  9:40 AM EDT by a video enabled telemedicine application and verified that I am speaking with the correct person using two identifiers.  Location of all participants today Patient: at home Provider: at office   I discussed the limitations of evaluation and management by telemedicine and the availability of in person appointments. The patient expressed understanding and agreed to proceed.  History of Present Illness: Here with URI symptoms/ST with covid testing pending; c/o initial backache and aching all over, [facility] June 27 and tx with ibuprofen; then yesterday morning with new worsening URI symptoms primarily ST, had temp 99.5 with non prod cough, myalgias, cbg 115 this am, o2 sat ok; taste and smell ok, denies n/v/diarrhea, no sob or wheezing, but robitusin not helping.  Covid testing pending from yesterday, just became concerned as symptoms are worsening  Denies worsening depressive symptoms, suicidal ideation, or panic; has ongoing anxiety.  Wife also ill with similar symptoms Past Medical History:  Diagnosis Date   Diabetes mellitus without complication ([place])    GERD (gastroesophageal reflux disease)    Hyperlipidemia    Hypertension    Psoriasis    History reviewed. No pertinent surgical history.  reports that he quit smoking about 37 years ago. His smoking use included cigarettes. He has never used smokeless tobacco. He reports current alcohol use of about 7.0 standard drinks of alcohol per week. He reports that he does not use drugs. family history includes Cancer in his mother; Cancer (age of onset: 33) in his father. Allergies  Allergen Reactions   Erythromycin Itching   Levaquin [Levofloxacin In D5w]     Light sensitivity    Current Outpatient Medications on File Prior to Visit  Medication Sig Dispense  Refill   amLODipine (NORVASC) 2.5 MG tablet Take 1 tablet (2.5 mg total) by mouth daily. 90 tablet 3   escitalopram (LEXAPRO) 10 MG tablet Take 1 tablet (10 mg total) by mouth daily. 90 tablet 1   glipiZIDE (GLUCOTROL) 5 MG tablet TAKE 1 TABLET(5 MG) BY MOUTH DAILY BEFORE BREAKFAST 90 tablet 1   ibuprofen (ADVIL) 800 MG tablet Take 1 tablet (800 mg total) by mouth 3 (three) times daily. 21 tablet 0   metFORMIN (GLUCOPHAGE-XR) 500 MG 24 hr tablet TAKE 1 TABLET BY MOUTH DAILY AT 8 PM 180 tablet 0   Multiple Vitamin (MULTI-VITAMIN PO) Take by mouth daily.     rosuvastatin (CRESTOR) 10 MG tablet Take 1 tablet (10 mg total) by mouth daily. 90 tablet 3   triamcinolone (KENALOG) 0.1 % Apply 1 application topically 2 (two) times daily. 30 g 3   vitamin B-12 (CYANOCOBALAMIN) 500 MCG tablet Take 500 mcg by mouth daily.     zolpidem (AMBIEN) 5 MG tablet Take 1 tablet (5 mg total) by mouth at bedtime as needed for sleep. 15 tablet 2   No current facility-administered medications on file prior to visit.    Observations/Objective: Alert, NAD, appropriate mood and affect, resps normal, cn 2-12 intact, moves all 4s, no visible rash or swelling Lab Results  Component Value Date   WBC 6.3 12/17/2020   HGB 16.1 12/17/2020   HCT 49.1 12/17/2020   PLT 292.0 12/17/2020   GLUCOSE 78 12/17/2020   CHOL 135 10/15/2020   TRIG 84 10/15/2020   HDL 48  10/15/2020   LDLCALC 71 10/15/2020   ALT 16 12/17/2020   AST 16 12/17/2020   NA 138 12/17/2020   K 3.8 12/17/2020   CL 102 12/17/2020   CREATININE 0.76 12/17/2020   BUN 16 12/17/2020   CO2 29 12/17/2020   TSH 2.750 04/30/2020   PSA 1.75 03/05/2014   HGBA1C 5.7 (H) 10/15/2020   MICROALBUR 0.88 04/06/2014   Assessment and Plan: See notes  Follow Up Instructions: See notes   I discussed the assessment and treatment plan with the patient. The patient was provided an opportunity to ask questions and all were answered. The patient agreed with the plan and  demonstrated an understanding of the instructions.   The patient was advised to call back or seek an in-person evaluation if the symptoms worsen or if the condition fails to improve as anticipated.  Aaron Cower, MD

## 2021-02-28 NOTE — Telephone Encounter (Signed)
Patient's wife called, left VM to call PCP office to discuss COVID questions below.    Patient's wife called in wanted advice on what to do while in quarantine, since husband diagnosed with covid today. Please call back.

## 2021-02-28 NOTE — Telephone Encounter (Signed)
LVM instructing wife that office closes at 5p if she can call back prior to that to get questions answered.

## 2021-03-01 ENCOUNTER — Encounter: Payer: Self-pay | Admitting: Internal Medicine

## 2021-03-01 DIAGNOSIS — J069 Acute upper respiratory infection, unspecified: Secondary | ICD-10-CM | POA: Insufficient documentation

## 2021-03-01 DIAGNOSIS — E119 Type 2 diabetes mellitus without complications: Secondary | ICD-10-CM | POA: Insufficient documentation

## 2021-03-01 NOTE — Patient Instructions (Signed)
Please take all new medication as prescribed  Follow up covid testing results

## 2021-03-01 NOTE — Assessment & Plan Note (Signed)
Reassurance today,  Wife may need to be tested as well

## 2021-03-01 NOTE — Assessment & Plan Note (Signed)
Lab Results  Component Value Date   HGBA1C 5.7 (H) 10/15/2020   Stable, pt to continue current medical treatment glucotrol, metformin

## 2021-03-01 NOTE — Assessment & Plan Note (Signed)
Mild to mod, for antibx course, cough med prn, f/u covid testing,  to f/u any worsening symptoms or concerns

## 2021-03-03 ENCOUNTER — Encounter: Payer: Self-pay | Admitting: Emergency Medicine

## 2021-03-04 ENCOUNTER — Telehealth: Payer: Self-pay | Admitting: Emergency Medicine

## 2021-03-04 NOTE — Telephone Encounter (Signed)
Called and spoke with pt, he states he would like to know if he can take doxycycline and paxlovid together. Pt states he was prescribed azirthomax, but pt is allergic to that so pharmacy gave him doxycycline instead. Pt has COVID and wanted make sure it was ok to take both medications.

## 2021-03-04 NOTE — Telephone Encounter (Signed)
Caller states he was given a couple of prescriptions yesterday and he was sent an email and mychart sent a notification. One of the medications prescribed was zithromax. When he picked up his prescriptions at [facility] that was not there but he did get doxycyclene so he didn't know if it was different or if he can take it. He is being treated with paxlovid now. He has had a sore throat, runny nose, and achy all over.

## 2021-03-04 NOTE — Telephone Encounter (Signed)
Pt appt. Canceled for 03/05/21.

## 2021-03-04 NOTE — Telephone Encounter (Signed)
   Patient called and was wondering when he should be feeling better. He was wondering when he should get another covid test. He was also wondering if he should still be taking Doxycycline. He is requesting a call back and can be reached at [phone].

## 2021-03-04 NOTE — Telephone Encounter (Signed)
Redirect this message to Dr. Cathlean Cower who prescribed his medications last week.  Thanks.

## 2021-03-05 ENCOUNTER — Ambulatory Visit: Payer: Medicare Other

## 2021-03-05 NOTE — Telephone Encounter (Signed)
No need to take the doxycycline at all, thanks

## 2021-03-06 NOTE — Telephone Encounter (Signed)
Called and spoke with pt

## 2021-03-20 ENCOUNTER — Telehealth: Payer: Self-pay | Admitting: Emergency Medicine

## 2021-03-20 NOTE — Telephone Encounter (Signed)
LVM for pt ti rtn my call to r/s appt with [facility] on 03/21/21

## 2021-03-21 ENCOUNTER — Ambulatory Visit: Payer: Medicare Other

## 2021-04-03 ENCOUNTER — Other Ambulatory Visit: Payer: Self-pay

## 2021-04-03 ENCOUNTER — Ambulatory Visit (INDEPENDENT_AMBULATORY_CARE_PROVIDER_SITE_OTHER): Payer: Medicare Other

## 2021-04-03 ENCOUNTER — Other Ambulatory Visit: Payer: Self-pay | Admitting: Emergency Medicine

## 2021-04-03 VITALS — BP 118/80 | HR 67 | Temp 98.2°F | Ht 66.0 in | Wt 163.4 lb

## 2021-04-03 DIAGNOSIS — Z0000 Encounter for general adult medical examination without abnormal findings: Secondary | ICD-10-CM | POA: Diagnosis not present

## 2021-04-03 DIAGNOSIS — E1159 Type 2 diabetes mellitus with other circulatory complications: Secondary | ICD-10-CM

## 2021-04-03 DIAGNOSIS — E1169 Type 2 diabetes mellitus with other specified complication: Secondary | ICD-10-CM

## 2021-04-03 DIAGNOSIS — Z125 Encounter for screening for malignant neoplasm of prostate: Secondary | ICD-10-CM

## 2021-04-03 DIAGNOSIS — I152 Hypertension secondary to endocrine disorders: Secondary | ICD-10-CM

## 2021-04-03 NOTE — Progress Notes (Signed)
Subjective:   Aaron [name] is a 70 y.o. male who presents for Medicare Annual/Subsequent preventive examination.  Review of Systems     Cardiac Risk Factors include: advanced age (>28mn, >>53women);diabetes mellitus;dyslipidemia;hypertension;male gender     Objective:    Today's Vitals   04/03/21 1324  BP: 118/80  Pulse: 67  Temp: 98.2 F (36.8 C)  SpO2: 97%  Weight: 163 lb 6.4 oz (74.1 kg)  Height: '5\' 6"'$  (1.676 m)  PainSc: 0-No pain   Body mass index is 26.37 kg/m.  Advanced Directives 04/03/2021 10/25/2019  Does Patient Have a Medical Advance Directive? Yes No  Type of Advance Directive Living will;Healthcare Power of Attorney -  Does patient want to make changes to medical advance directive? No - Patient declined -  Would patient like information on creating a medical advance directive? - Yes (ED - Information included in AVS)    Current Medications (verified) Outpatient Encounter Medications as of 04/03/2021  Medication Sig   glipiZIDE (GLUCOTROL) 5 MG tablet TAKE 1 TABLET(5 MG) BY MOUTH DAILY BEFORE BREAKFAST   metFORMIN (GLUCOPHAGE-XR) 500 MG 24 hr tablet TAKE 1 TABLET BY MOUTH DAILY AT 8 PM   amLODipine (NORVASC) 2.5 MG tablet Take 1 tablet (2.5 mg total) by mouth daily. (Patient not taking: Reported on 04/03/2021)   escitalopram (LEXAPRO) 10 MG tablet Take 1 tablet (10 mg total) by mouth daily. (Patient not taking: Reported on 04/03/2021)   ibuprofen (ADVIL) 800 MG tablet Take 1 tablet (800 mg total) by mouth 3 (three) times daily. (Patient not taking: Reported on 04/03/2021)   Multiple Vitamin (MULTI-VITAMIN PO) Take by mouth daily. (Patient not taking: Reported on 04/03/2021)   rosuvastatin (CRESTOR) 10 MG tablet Take 1 tablet (10 mg total) by mouth daily. (Patient not taking: Reported on 04/03/2021)   traMADol (ULTRAM) 50 MG tablet Take 1 tablet (50 mg total) by mouth every 6 (six) hours as needed. (Patient not taking: Reported on 04/03/2021)   triamcinolone (KENALOG) 0.1  % Apply 1 application topically 2 (two) times daily. (Patient not taking: Reported on 04/03/2021)   vitamin B-12 (CYANOCOBALAMIN) 500 MCG tablet Take 500 mcg by mouth daily. (Patient not taking: Reported on 04/03/2021)   zolpidem (AMBIEN) 5 MG tablet Take 1 tablet (5 mg total) by mouth at bedtime as needed for sleep. (Patient not taking: Reported on 04/03/2021)   No facility-administered encounter medications on file as of 04/03/2021.    Allergies (verified) Erythromycin and Levaquin [levofloxacin in d5w]   History: Past Medical History:  Diagnosis Date   Diabetes mellitus without complication (H[facility])    GERD (gastroesophageal reflux disease)    Hyperlipidemia    Hypertension    Psoriasis    History reviewed. No pertinent surgical history. Family History  Problem Relation Age of Onset   Cancer Mother        breast ca spread to bone   Cancer Father 972      brain cancer   Migraines Neg Hx    Headache Neg Hx    Social History   Socioeconomic History   Marital status: Married    Spouse name: Not on file   Number of children: 0   Years of education: Not on file   Highest education level: High school graduate  Occupational History   Occupation: SPress photographer retired   Tobacco Use   Smoking status: Former    Types: Cigarettes    Quit date: 1985    Years since quitting: 346  Smokeless tobacco: Never  Vaping Use   Vaping Use: Never used  Substance and Sexual Activity   Alcohol use: Yes    Alcohol/week: 7.0 standard drinks    Types: 7 Cans of beer per week    Comment: 1 beer with dinner usually daily   Drug use: No   Sexual activity: Yes  Other Topics Concern   Not on file  Social History Narrative   Marital status:  Married.             Children:   none      Lives:      Employment:  Press photographer      Tobacco:  none      Alcohol:  About 7 drinks per week      Drugs:   none      Exercise:      Seatbelt:      Education: [employer].      Lives at home with wife   Right handed    Caffeine: 2 cups/day   Social Determinants of Health   Financial Resource Strain: Low Risk    Difficulty of Paying Living Expenses: Not hard at all  Food Insecurity: No Food Insecurity   Worried About Charity fundraiser in the Last Year: Never true   Ran Out of Food in the Last Year: Never true  Transportation Needs: No Transportation Needs   Lack of Transportation (Medical): No   Lack of Transportation (Non-Medical): No  Physical Activity: Sufficiently Active   Days of Exercise per Week: 5 days   Minutes of Exercise per Session: 30 min  Stress: No Stress Concern Present   Feeling of Stress : Not at all  Social Connections: Socially Integrated   Frequency of Communication with Friends and Family: More than three times a week   Frequency of Social Gatherings with Friends and Family: More than three times a week   Attends Religious Services: 1 to 4 times per year   Active Member of [employer] or Organizations: Yes   Attends Archivist Meetings: 1 to 4 times per year   Marital Status: Married    Tobacco Counseling Counseling given: Not Answered   Clinical Intake:  Pre-visit preparation completed: Yes  Pain : No/denies pain Pain Score: 0-No pain     BMI - recorded: 26.37 Nutritional Status: BMI 25 -29 Overweight Nutritional Risks: None Diabetes: Yes CBG done?: Yes (150 fasting) CBG resulted in Enter/ Edit results?: Yes Did pt. bring in CBG monitor from home?: No  How often do you need to have someone help you when you read instructions, pamphlets, or other written materials from your doctor or pharmacy?: 1 - Never What is the last grade level you completed in school?: High School Graduate  Diabetic? yes  Interpreter Needed?: No  Information entered by :: Lisette Abu, LPN   Activities of Daily Living In your present state of health, do you have any difficulty performing the following activities: 04/03/2021 01/08/2021  Hearing? N N  Vision? N N   Difficulty concentrating or making decisions? N Y  Walking or climbing stairs? N N  Dressing or bathing? N N  Doing errands, shopping? N N  Preparing Food and eating ? N -  Using the Toilet? N -  In the past six months, have you accidently leaked urine? N -  Do you have problems with loss of bowel control? N -  Managing your Medications? N -  Managing your Finances? N -  Housekeeping or managing your Housekeeping? N -  Some recent data might be hidden    Patient Care Team: [name], MD as PCP - General (Internal Medicine) [place], [place] as Consulting Physician (Optometry)  Indicate any recent Medical Services you may have received from other than [facility] providers in the past year (date may be approximate).     Assessment:   This is a routine wellness examination for Shep.  Hearing/Vision screen Hearing Screening - Comments:: Patient denied any hearing difficulty. Vision Screening - Comments:: Patient wears glasses.  [facility] exam done [facility] at [facility].  Dietary issues and exercise activities discussed: Current Exercise Habits: Home exercise routine, Type of exercise: walking;Other - see comments (swimming), Time (Minutes): 30, Frequency (Times/Week): 5, Weekly Exercise (Minutes/Week): 150, Intensity: Moderate, Exercise limited by: cardiac condition(s)   Goals Addressed               This Visit's Progress     Patient Stated (pt-stated)        My goal is to stay on track with my weight loss.  I would like to do more free weights to gain more arm muscle.  I will continue to do intermittent fasting, watching my diet and increasing my water intake.       Depression Screen PHQ 2/9 Scores 04/03/2021 01/08/2021 12/23/2020 12/17/2020 12/17/2020 11/12/2020 10/22/2020  PHQ - 2 Score '2 5 2 2 2 '$ 0 0  PHQ- 9 Score - '16 6 6 '$ - 6 -    Fall Risk Fall Risk  04/03/2021 12/23/2020 12/17/2020 11/12/2020 10/22/2020  Falls in the past year? 1 0 1 1 0  Comment - - - - -   Number falls in past yr: 0 0 0 0 0  Injury with Fall? 1 - 1 1 0  Comment - - right heel and tailbone - -  Risk Factor Category  - - - - -  Risk for fall due to : - No Fall Risks - - -  Follow up Falls evaluation completed Falls evaluation completed Falls evaluation completed Falls evaluation completed Falls evaluation completed    [place]:  Any stairs in or around the home? Yes  If so, are there any without handrails? No  Home free of loose throw rugs in walkways, pet beds, electrical cords, etc? Yes  Adequate lighting in your home to reduce risk of falls? Yes   ASSISTIVE DEVICES UTILIZED TO PREVENT FALLS:  Life alert? No  Use of a cane, walker or w/c? No  Grab bars in the bathroom? No  Shower chair or bench in shower? No  Elevated toilet seat or a handicapped toilet? No   TIMED UP AND GO:  Was the test performed? Yes .  Length of time to ambulate 10 feet: 7 sec.   Gait steady and fast without use of assistive device  Cognitive Function: Normal cognitive status assessed by direct observation by this Nurse Health Advisor. No abnormalities found.       6CIT Screen 10/25/2019  What Year? 0 points  What month? 0 points  What time? 0 points  Count back from 20 0 points  Months in reverse 0 points  Repeat phrase 0 points  Total Score 0    Immunizations Immunization History  Administered Date(s) Administered   Fluad Quad(high Dose 65+) 05/29/2019, 06/06/2020   Influenza,inj,Quad PF,6+ Mos 06/28/2014, 04/29/2015, 07/13/2016, 05/31/2017, 06/11/2018   Moderna Sars-Covid-2 Vaccination 11/09/2019, 12/12/2019   Pneumococcal Conjugate-13  04/29/2015, 03/25/2018   Pneumococcal Polysaccharide-23 04/06/2014, 05/29/2019   Td 03/30/2016   Tdap 12/21/2005   Zoster, Live 11/25/2015    TDAP status: Up to date  Flu Vaccine status: Up to date  Pneumococcal vaccine status: Up to date  Covid-19 vaccine status: Completed vaccines  Qualifies  for Shingles Vaccine? Yes   Zostavax completed Yes   Shingrix Completed?: No.    Education has been provided regarding the importance of this vaccine. Patient has been advised to call insurance company to determine out of pocket expense if they have not yet received this vaccine. Advised may also receive vaccine at local pharmacy or Health Dept. Verbalized acceptance and understanding.  Screening Tests Health Maintenance  Topic Date Due   Zoster Vaccines- Shingrix (1 of 2) Never done   COVID-19 Vaccine (3 - Moderna risk series) 01/09/2020   INFLUENZA VACCINE  03/31/2021   COLONOSCOPY (Pts 45-7yr Insurance coverage will need to be confirmed)  11/12/2021 (Originally 09/30/2020)   HEMOGLOBIN A1C  04/14/2021   OPHTHALMOLOGY EXAM  05/27/2021   FOOT EXAM  06/12/2021   URINE MICROALBUMIN  06/12/2021   TETANUS/TDAP  03/30/2026   Hepatitis C Screening  Completed   PNA vac Low Risk Adult  Completed   HPV VACCINES  Aged Out    Health Maintenance  Health Maintenance Due  Topic Date Due   Zoster Vaccines- Shingrix (1 of 2) Never done   COVID-19 Vaccine (3 - Moderna risk series) 01/09/2020   INFLUENZA VACCINE  03/31/2021    Colorectal cancer screening: Type of screening: Colonoscopy. Completed 09/30/2017. Repeat every 3 years  Lung Cancer Screening: (Low Dose CT Chest recommended if Age 68-80years, 30 pack-year currently smoking OR have quit w/in 15years.) does not qualify.   Lung Cancer Screening Referral: no  Additional Screening:  Hepatitis C Screening: does qualify; Completed yes  Vision Screening: Recommended annual ophthalmology exams for early detection of glaucoma and other disorders of the eye. Is the patient up to date with their annual eye exam?  Yes  Who is the provider or what is the name of the office in which the patient attends annual eye exams?  [facility] If pt is not established with a provider, would they like to be referred to a provider to  establish care? No .   Dental Screening: Recommended annual dental exams for proper oral hygiene  Community Resource Referral / Chronic Care Management: CRR required this visit?  No   CCM required this visit?  No      Plan:     I have personally reviewed and noted the following in the patient's chart:   Medical and social history Use of alcohol, tobacco or illicit drugs  Current medications and supplements including opioid prescriptions. Patient is not currently taking opioid prescriptions. Functional ability and status Nutritional status Physical activity Advanced directives List of other physicians Hospitalizations, surgeries, and ER visits in previous 12 months Vitals Screenings to include cognitive, depression, and falls Referrals and appointments  In addition, I have reviewed and discussed with patient certain preventive protocols, quality metrics, and best practice recommendations. A written personalized care plan for preventive services as well as general preventive health recommendations were provided to patient.     SSheral Flow LPN   [id number]  Nurse Notes: n/a

## 2021-04-03 NOTE — Patient Instructions (Signed)
[name] , Thank you for taking time to come for your Medicare Wellness Visit. I appreciate your ongoing commitment to your health goals. Please review the following plan we discussed and let me know if I can assist you in the future.   Screening recommendations/referrals: Colonoscopy: last done 09/30/2017; due every 3 years Recommended yearly ophthalmology/optometry visit for glaucoma screening and checkup Recommended yearly dental visit for hygiene and checkup  Vaccinations: Influenza vaccine: 06/06/2020 Pneumococcal vaccine: 03/25/2018, 05/29/2019 Tdap vaccine: 03/30/2016; due every 10 years Shingles vaccine: never done; Please call your insurance company to determine your out of pocket expense for the Shingrix vaccine. You may receive this vaccine at your local pharmacy. Covid-19: 11/09/2019, 12/12/2019  Advanced directives: Please bring a copy of your health care power of attorney and living will to the office at your convenience.  Conditions/risks identified: Yes; Client understands the importance of follow-up with providers by attending scheduled visits and discussed goals to eat healthier, increase physical activity, exercise the brain, socialize more, get enough sleep and make time for laughter.  Next appointment: Please schedule your next Medicare Wellness Visit with your Nurse Health Advisor in 1 year by calling [phone].  Preventive Care 68 Years and Older, Male Preventive care refers to lifestyle choices and visits with your health care provider that can promote health and wellness. What does preventive care include? A yearly physical exam. This is also called an annual well check. Dental exams once or twice a year. Routine eye exams. Ask your health care provider how often you should have your eyes checked. Personal lifestyle choices, including: Daily care of your teeth and gums. Regular physical activity. Eating a healthy diet. Avoiding tobacco and drug use. Limiting  alcohol use. Practicing safe sex. Taking low doses of aspirin every day. Taking vitamin and mineral supplements as recommended by your health care provider. What happens during an annual well check? The services and screenings done by your health care provider during your annual well check will depend on your age, overall health, lifestyle risk factors, and family history of disease. Counseling  Your health care provider may ask you questions about your: Alcohol use. Tobacco use. Drug use. Emotional well-being. Home and relationship well-being. Sexual activity. Eating habits. History of falls. Memory and ability to understand (cognition). Work and work Statistician. Screening  You may have the following tests or measurements: Height, weight, and BMI. Blood pressure. Lipid and cholesterol levels. These may be checked every 5 years, or more frequently if you are over 64 years old. Skin check. Lung cancer screening. You may have this screening every year starting at age 20 if you have a 30-pack-year history of smoking and currently smoke or have quit within the past 15 years. Fecal occult blood test (FOBT) of the stool. You may have this test every year starting at age 32. Flexible sigmoidoscopy or colonoscopy. You may have a sigmoidoscopy every 5 years or a colonoscopy every 10 years starting at age 56. Prostate cancer screening. Recommendations will vary depending on your family history and other risks. Hepatitis C blood test. Hepatitis B blood test. Sexually transmitted disease (STD) testing. Diabetes screening. This is done by checking your blood sugar (glucose) after you have not eaten for a while (fasting). You may have this done every 1-3 years. Abdominal aortic aneurysm (AAA) screening. You may need this if you are a current or former smoker. Osteoporosis. You may be screened starting at age 62 if you are at high risk. Talk with your health  care provider about your test results,  treatment options, and if necessary, the need for more tests. Vaccines  Your health care provider may recommend certain vaccines, such as: Influenza vaccine. This is recommended every year. Tetanus, diphtheria, and acellular pertussis (Tdap, Td) vaccine. You may need a Td booster every 10 years. Zoster vaccine. You may need this after age 72. Pneumococcal 13-valent conjugate (PCV13) vaccine. One dose is recommended after age 56. Pneumococcal polysaccharide (PPSV23) vaccine. One dose is recommended after age 47. Talk to your health care provider about which screenings and vaccines you need and how often you need them. This information is not intended to replace advice given to you by your health care provider. Make sure you discuss any questions you have with your health care provider. Document Released: 09/13/2015 Document Revised: 05/06/2016 Document Reviewed: 06/18/2015 Elsevier Interactive Patient Education  2017 [place] Prevention in the Home Falls can cause injuries. They can happen to people of all ages. There are many things you can do to make your home safe and to help prevent falls. What can I do on the outside of my home? Regularly fix the edges of walkways and driveways and fix any cracks. Remove anything that might make you trip as you walk through a door, such as a raised step or threshold. Trim any bushes or trees on the path to your home. Use bright outdoor lighting. Clear any walking paths of anything that might make someone trip, such as rocks or tools. Regularly check to see if handrails are loose or broken. Make sure that both sides of any steps have handrails. Any raised decks and porches should have guardrails on the edges. Have any leaves, snow, or ice cleared regularly. Use sand or salt on walking paths during winter. Clean up any spills in your garage right away. This includes oil or grease spills. What can I do in the bathroom? Use night  lights. Install grab bars by the toilet and in the tub and shower. Do not use towel bars as grab bars. Use non-skid mats or decals in the tub or shower. If you need to sit down in the shower, use a plastic, non-slip stool. Keep the floor dry. Clean up any water that spills on the floor as soon as it happens. Remove soap buildup in the tub or shower regularly. Attach bath mats securely with double-sided non-slip rug tape. Do not have throw rugs and other things on the floor that can make you trip. What can I do in the bedroom? Use night lights. Make sure that you have a light by your bed that is easy to reach. Do not use any sheets or blankets that are too big for your bed. They should not hang down onto the floor. Have a firm chair that has side arms. You can use this for support while you get dressed. Do not have throw rugs and other things on the floor that can make you trip. What can I do in the kitchen? Clean up any spills right away. Avoid walking on wet floors. Keep items that you use a lot in easy-to-reach places. If you need to reach something above you, use a strong step stool that has a grab bar. Keep electrical cords out of the way. Do not use floor polish or wax that makes floors slippery. If you must use wax, use non-skid floor wax. Do not have throw rugs and other things on the floor that can make you trip. What can  I do with my stairs? Do not leave any items on the stairs. Make sure that there are handrails on both sides of the stairs and use them. Fix handrails that are broken or loose. Make sure that handrails are as long as the stairways. Check any carpeting to make sure that it is firmly attached to the stairs. Fix any carpet that is loose or worn. Avoid having throw rugs at the top or bottom of the stairs. If you do have throw rugs, attach them to the floor with carpet tape. Make sure that you have a light switch at the top of the stairs and the bottom of the stairs. If  you do not have them, ask someone to add them for you. What else can I do to help prevent falls? Wear shoes that: Do not have high heels. Have rubber bottoms. Are comfortable and fit you well. Are closed at the toe. Do not wear sandals. If you use a stepladder: Make sure that it is fully opened. Do not climb a closed stepladder. Make sure that both sides of the stepladder are locked into place. Ask someone to hold it for you, if possible. Clearly mark and make sure that you can see: Any grab bars or handrails. First and last steps. Where the edge of each step is. Use tools that help you move around (mobility aids) if they are needed. These include: Canes. Walkers. Scooters. Crutches. Turn on the lights when you go into a dark area. Replace any light bulbs as soon as they burn out. Set up your furniture so you have a clear path. Avoid moving your furniture around. If any of your floors are uneven, fix them. If there are any pets around you, be aware of where they are. Review your medicines with your doctor. Some medicines can make you feel dizzy. This can increase your chance of falling. Ask your doctor what other things that you can do to help prevent falls. This information is not intended to replace advice given to you by your health care provider. Make sure you discuss any questions you have with your health care provider. Document Released: 06/13/2009 Document Revised: 01/23/2016 Document Reviewed: 09/21/2014 Elsevier Interactive Patient Education  2017 [employer].

## 2021-04-07 ENCOUNTER — Ambulatory Visit: Payer: Medicare Other | Admitting: Emergency Medicine

## 2021-04-08 ENCOUNTER — Other Ambulatory Visit: Payer: Self-pay

## 2021-04-08 ENCOUNTER — Ambulatory Visit (INDEPENDENT_AMBULATORY_CARE_PROVIDER_SITE_OTHER): Payer: Medicare Other | Admitting: Emergency Medicine

## 2021-04-08 ENCOUNTER — Encounter: Payer: Self-pay | Admitting: Emergency Medicine

## 2021-04-08 VITALS — BP 124/68 | HR 78 | Temp 98.0°F | Ht 66.0 in | Wt 161.0 lb

## 2021-04-08 DIAGNOSIS — E1169 Type 2 diabetes mellitus with other specified complication: Secondary | ICD-10-CM

## 2021-04-08 DIAGNOSIS — E1159 Type 2 diabetes mellitus with other circulatory complications: Secondary | ICD-10-CM | POA: Diagnosis not present

## 2021-04-08 DIAGNOSIS — Z8616 Personal history of COVID-19: Secondary | ICD-10-CM

## 2021-04-08 DIAGNOSIS — Z23 Encounter for immunization: Secondary | ICD-10-CM

## 2021-04-08 DIAGNOSIS — I152 Hypertension secondary to endocrine disorders: Secondary | ICD-10-CM | POA: Diagnosis not present

## 2021-04-08 DIAGNOSIS — E785 Hyperlipidemia, unspecified: Secondary | ICD-10-CM | POA: Diagnosis not present

## 2021-04-08 LAB — CBC WITH DIFFERENTIAL/PLATELET
Basophils Absolute: 0 10*3/uL (ref 0.0–0.1)
Basophils Relative: 0.6 % (ref 0.0–3.0)
Eosinophils Absolute: 0 10*3/uL (ref 0.0–0.7)
Eosinophils Relative: 0.8 % (ref 0.0–5.0)
HCT: 46.5 % (ref 39.0–52.0)
Hemoglobin: 15.2 g/dL (ref 13.0–17.0)
Lymphocytes Relative: 27.8 % (ref 12.0–46.0)
Lymphs Abs: 1.8 10*3/uL (ref 0.7–4.0)
MCHC: 32.7 g/dL (ref 30.0–36.0)
MCV: 85.2 fl (ref 78.0–100.0)
Monocytes Absolute: 0.5 10*3/uL (ref 0.1–1.0)
Monocytes Relative: 8 % (ref 3.0–12.0)
Neutro Abs: 4 10*3/uL (ref 1.4–7.7)
Neutrophils Relative %: 62.8 % (ref 43.0–77.0)
Platelets: 232 10*3/uL (ref 150.0–400.0)
RBC: 5.45 Mil/uL (ref 4.22–5.81)
RDW: 13.9 % (ref 11.5–15.5)
WBC: 6.4 10*3/uL (ref 4.0–10.5)

## 2021-04-08 LAB — COMPREHENSIVE METABOLIC PANEL
ALT: 16 U/L (ref 0–53)
AST: 16 U/L (ref 0–37)
Albumin: 4.2 g/dL (ref 3.5–5.2)
Alkaline Phosphatase: 69 U/L (ref 39–117)
BUN: 15 mg/dL (ref 6–23)
CO2: 29 mEq/L (ref 19–32)
Calcium: 9.2 mg/dL (ref 8.4–10.5)
Chloride: 102 mEq/L (ref 96–112)
Creatinine, Ser: 0.82 mg/dL (ref 0.40–1.50)
GFR: 90.46 mL/min (ref >60.00)
Glucose, Bld: 96 mg/dL (ref 70–99)
Potassium: 4.3 mEq/L (ref 3.5–5.1)
Sodium: 138 mEq/L (ref 135–145)
Total Bilirubin: 0.7 mg/dL (ref 0.2–1.2)
Total Protein: 7.2 g/dL (ref 6.0–8.3)

## 2021-04-08 LAB — LIPID PANEL
Cholesterol: 153 mg/dL (ref 0–200)
HDL: 43.8 mg/dL (ref >39.00)
LDL Cholesterol: 89 mg/dL (ref 0–99)
NonHDL: 109.67
Total CHOL/HDL Ratio: 4
Triglycerides: 102 mg/dL (ref 0.0–149.0)
VLDL: 20.4 mg/dL (ref 0.0–40.0)

## 2021-04-08 LAB — POCT GLYCOSYLATED HEMOGLOBIN (HGB A1C): Hemoglobin A1C: 5.8 % — AB (ref 4.0–5.6)

## 2021-04-08 NOTE — Progress Notes (Signed)
Aaron [name] 70 y.o.   Chief Complaint  Patient presents with   Follow-up    Diabetes and cholesterol    HISTORY OF PRESENT ILLNESS: This is a 70 y.o. male with history of diabetes, dyslipidemia, and hypertension here for follow-up. Presently taking metformin once a day and glipizide once a day as needed. Had recent COVID infection.  Took Paxlovid for 5 days.  Recovered well.  No complications. Also requesting shingles vaccine. Stress much better.  Depression improved. No other complaints or medical concerns today. Lab Results  Component Value Date   CHOL 135 10/15/2020   HDL 48 10/15/2020   LDLCALC 71 10/15/2020   TRIG 84 10/15/2020   CHOLHDL 2.8 10/15/2020   Lab Results  Component Value Date   HGBA1C 5.7 (H) 10/15/2020    HPI   Prior to Admission medications   Medication Sig Start Date End Date Taking? Authorizing Provider  glipiZIDE (GLUCOTROL) 5 MG tablet TAKE 1 TABLET(5 MG) BY MOUTH DAILY BEFORE BREAKFAST 12/21/20  Yes Breonia Kirstein, Ines Bloomer, MD  metFORMIN (GLUCOPHAGE-XR) 500 MG 24 hr tablet TAKE 1 TABLET BY MOUTH DAILY AT 8 PM 11/26/20  Yes Daxton Nydam, Ines Bloomer, MD  Multiple Vitamin (MULTI-VITAMIN PO) Take by mouth daily.   Yes [provider]  amLODipine (NORVASC) 2.5 MG tablet Take 1 tablet (2.5 mg total) by mouth daily. Patient not taking: No sig reported 07/05/20   Minus Breeding, MD  escitalopram (LEXAPRO) 10 MG tablet Take 1 tablet (10 mg total) by mouth daily. Patient not taking: No sig reported 01/27/21 04/27/21  [name], MD  ibuprofen (ADVIL) 800 MG tablet Take 1 tablet (800 mg total) by mouth 3 (three) times daily. Patient not taking: No sig reported 02/26/21   Pearson Forster, NP  rosuvastatin (CRESTOR) 10 MG tablet Take 1 tablet (10 mg total) by mouth daily. Patient not taking: No sig reported 06/12/20   [name], MD  traMADol (ULTRAM) 50 MG tablet Take 1 tablet (50 mg total) by mouth every 6 (six) hours as  needed. Patient not taking: No sig reported 02/28/21   Biagio Borg, MD  triamcinolone (KENALOG) 0.1 % Apply 1 application topically 2 (two) times daily. Patient not taking: No sig reported 11/12/20   [name], MD  vitamin B-12 (CYANOCOBALAMIN) 500 MCG tablet Take 500 mcg by mouth daily. Patient not taking: No sig reported    [provider]  zolpidem (AMBIEN) 5 MG tablet Take 1 tablet (5 mg total) by mouth at bedtime as needed for sleep. Patient not taking: No sig reported 12/11/20   [name], MD    Allergies  Allergen Reactions   Erythromycin Itching   Levaquin [Levofloxacin In D5w]     Light sensitivity     Patient Active Problem List   Diagnosis Date Noted   Diabetes ([place]) 03/01/2021   URI (upper respiratory infection) 03/01/2021   Chronic anxiety 01/08/2021   Raynaud's phenomenon without gangrene 01/08/2021   Rash and nonspecific skin eruption 12/23/2020   Cold finger without peripheral vascular disease 10/01/2020   Other psoriasis 10/01/2020   Coronary artery calcification 06/12/2020   History of palpitations 06/12/2020   Tingling of both feet 06/12/2020   Erythrocytosis 05/08/2020   Weight loss, non-intentional 05/08/2020   Night sweats 05/08/2020   Hypertension associated with diabetes ([place]) 05/29/2019   Gastroesophageal reflux disease without esophagitis 03/13/2017   Dyslipidemia associated with type 2 diabetes mellitus ([place]) 03/13/2017    Past Medical History:  Diagnosis Date   Diabetes mellitus without complication (HCC)    GERD (gastroesophageal reflux disease)    Hyperlipidemia    Hypertension    Psoriasis     No past surgical history on file.  Social History   Socioeconomic History   Marital status: Married    Spouse name: Not on file   Number of children: 0   Years of education: Not on file   Highest education level: High school graduate  Occupational History   Occupation: Press photographer, retired   Tobacco Use   Smoking  status: Former    Types: Cigarettes    Quit date: 1985    Years since quitting: 37.6   Smokeless tobacco: Never  Vaping Use   Vaping Use: Never used  Substance and Sexual Activity   Alcohol use: Yes    Alcohol/week: 7.0 standard drinks    Types: 7 Cans of beer per week    Comment: 1 beer with dinner usually daily   Drug use: No   Sexual activity: Yes  Other Topics Concern   Not on file  Social History Narrative   Marital status:  Married.             Children:   none      Lives:      Employment:  Press photographer      Tobacco:  none      Alcohol:  About 7 drinks per week      Drugs:   none      Exercise:      Seatbelt:      Education: [employer].      Lives at home with wife   Right handed   Caffeine: 2 cups/day   Social Determinants of Health   Financial Resource Strain: Low Risk    Difficulty of Paying Living Expenses: Not hard at all  Food Insecurity: No Food Insecurity   Worried About Charity fundraiser in the Last Year: Never true   Ran Out of Food in the Last Year: Never true  Transportation Needs: No Transportation Needs   Lack of Transportation (Medical): No   Lack of Transportation (Non-Medical): No  Physical Activity: Sufficiently Active   Days of Exercise per Week: 5 days   Minutes of Exercise per Session: 30 min  Stress: No Stress Concern Present   Feeling of Stress : Not at all  Social Connections: Socially Integrated   Frequency of Communication with Friends and Family: More than three times a week   Frequency of Social Gatherings with Friends and Family: More than three times a week   Attends Religious Services: 1 to 4 times per year   Active Member of [employer] or Organizations: Yes   Attends Archivist Meetings: 1 to 4 times per year   Marital Status: Married  Human resources officer Violence: Not At Risk   Fear of Current or Ex-Partner: No   Emotionally Abused: No   Physically Abused: No   Sexually Abused: No    Family History  Problem Relation  Age of Onset   Cancer Mother        breast ca spread to bone   Cancer Father 45       brain cancer   Migraines Neg Hx    Headache Neg Hx      Review of Systems  Constitutional: Negative.  Negative for chills and fever.  HENT: Negative.  Negative for congestion and sore throat.   Respiratory: Negative.  Negative for cough  and shortness of breath.   Cardiovascular: Negative.  Negative for chest pain and palpitations.  Gastrointestinal: Negative.  Negative for abdominal pain, diarrhea, nausea and vomiting.  Genitourinary: Negative.   Musculoskeletal: Negative.   Skin: Negative.  Negative for rash.  Neurological:  Negative for dizziness and headaches.  All other systems reviewed and are negative.  Today's Vitals   04/08/21 1447  BP: 124/68  Pulse: 78  Temp: 98 F (36.7 C)  TempSrc: Oral  SpO2: 96%  Weight: 161 lb (73 kg)  Height: '5\' 6"'$  (1.676 m)   Body mass index is 25.99 kg/m. Wt Readings from Last 3 Encounters:  04/08/21 161 lb (73 kg)  04/03/21 163 lb 6.4 oz (74.1 kg)  01/09/21 151 lb (68.5 kg)    Physical Exam Vitals reviewed.  Constitutional:      Appearance: Normal appearance.  HENT:     Head: Normocephalic.  Eyes:     Extraocular Movements: Extraocular movements intact.     Conjunctiva/sclera: Conjunctivae normal.     Pupils: Pupils are equal, round, and reactive to light.  Cardiovascular:     Rate and Rhythm: Normal rate and regular rhythm.     Pulses: Normal pulses.     Heart sounds: Normal heart sounds.  Pulmonary:     Effort: Pulmonary effort is normal.     Breath sounds: Normal breath sounds.  Musculoskeletal:        General: Normal range of motion.     Cervical back: Normal range of motion and neck supple.  Skin:    General: Skin is warm and dry.     Capillary Refill: Capillary refill takes less than 2 seconds.  Neurological:     General: No focal deficit present.     Mental Status: He is alert and oriented to person, place, and time.   Psychiatric:        Mood and Affect: Mood normal.        Behavior: Behavior normal.    Results for orders placed or performed in visit on 04/08/21 (from the past 24 hour(s))  POCT glycosylated hemoglobin (Hb A1C)     Status: Abnormal   Collection Time: 04/08/21  3:21 PM  Result Value Ref Range   Hemoglobin A1C 5.8 (A) 4.0 - 5.6 %   HbA1c POC (<> result, manual entry)     HbA1c, POC (prediabetic range)     HbA1c, POC (controlled diabetic range)      ASSESSMENT & PLAN: Hypertension associated with diabetes ([place]) Well-controlled hypertension off medications. Well-controlled diabetes with hemoglobin A1c 5.8.  Continue once a day metformin 500 mg and once a day glipizide 5 mg. Diet and nutrition discussed. Follow-up in 6 months.  Dyslipidemia associated with type 2 diabetes mellitus ([place]) Diet and nutrition discussed.  Continue rosuvastatin 10 mg daily. The 10-year ASCVD risk score Mikey Bussing DC Brooke Bonito., et al., 2013) is: 25.6%   Values used to calculate the score:     Age: 59 years     Sex: Male     Is Non-Hispanic African American: No     Diabetic: Yes     Tobacco smoker: No     Systolic Blood Pressure: [id number] mmHg     Is BP treated: Yes     HDL Cholesterol: 48 mg/dL     Total Cholesterol: 135 mg/dL Miki was seen today for follow-up.  Diagnoses and all orders for this visit:  Hypertension associated with diabetes ([place]) -     POCT glycosylated hemoglobin (Hb A1C) -  CBC with Differential/Platelet -     Comprehensive metabolic panel  Dyslipidemia associated with type 2 diabetes mellitus (HCC) -     CBC with Differential/Platelet -     Lipid panel  History of COVID-19 -     SAR CoV2 Serology (COVID 19)AB(IGG)IA  Need for shingles vaccine -     Varicella-zoster vaccine IM (Shingrix)  Patient Instructions  Health Maintenance After Age 63 After age 35, you are at a higher risk for certain long-term diseases and infections as well as injuries from falls. Falls are a major  cause of broken bones and head injuries in people who are older than age 42. Getting regular preventive care can help to keep you healthy and well. Preventive care includes getting regular testing and making lifestyle changes as recommended by your health care provider. Talk with your health care provider about: Which screenings and tests you should have. A screening is a test that checks for a disease when you have no symptoms. A diet and exercise plan that is right for you. What should I know about screenings and tests to prevent falls? Screening and testing are the best ways to find a health problem early. Early diagnosis and treatment give you the best chance of managing medical conditions that are common after age 61. Certain conditions and lifestyle choices may make you more likely to have a fall. Your health care provider may recommend: Regular vision checks. Poor vision and conditions such as cataracts can make you more likely to have a fall. If you wear glasses, make sure to get your prescription updated if your vision changes. Medicine review. Work with your health care provider to regularly review all of the medicines you are taking, including over-the-counter medicines. Ask your health care provider about any side effects that may make you more likely to have a fall. Tell your health care provider if any medicines that you take make you feel dizzy or sleepy. Osteoporosis screening. Osteoporosis is a condition that causes the bones to get weaker. This can make the bones weak and cause them to break more easily. Blood pressure screening. Blood pressure changes and medicines to control blood pressure can make you feel dizzy. Strength and balance checks. Your health care provider may recommend certain tests to check your strength and balance while standing, walking, or changing positions. Foot health exam. Foot pain and numbness, as well as not wearing proper footwear, can make you more likely to  have a fall. Depression screening. You may be more likely to have a fall if you have a fear of falling, feel emotionally low, or feel unable to do activities that you used to do. Alcohol use screening. Using too much alcohol can affect your balance and may make you more likely to have a fall. What actions can I take to lower my risk of falls? General instructions Talk with your health care provider about your risks for falling. Tell your health care provider if: You fall. Be sure to tell your health care provider about all falls, even ones that seem minor. You feel dizzy, sleepy, or off-balance. Take over-the-counter and prescription medicines only as told by your health care provider. These include any supplements. Eat a healthy diet and maintain a healthy weight. A healthy diet includes low-fat dairy products, low-fat (lean) meats, and fiber from whole grains, beans, and lots of fruits and vegetables. Home safety Remove any tripping hazards, such as rugs, cords, and clutter. Install safety equipment such as grab bars  in bathrooms and safety rails on stairs. Keep rooms and walkways well-lit. Activity  Follow a regular exercise program to stay fit. This will help you maintain your balance. Ask your health care provider what types of exercise are appropriate for you. If you need a cane or walker, use it as recommended by your health care provider. Wear supportive shoes that have nonskid soles.  Lifestyle Do not drink alcohol if your health care provider tells you not to drink. If you drink alcohol, limit how much you have: 0-1 drink a day for women. 0-2 drinks a day for men. Be aware of how much alcohol is in your drink. In the [place] one drink equals one typical bottle of beer (12 oz), one-half glass of wine (5 oz), or one shot of hard liquor (1 oz). Do not use any products that contain nicotine or tobacco, such as cigarettes and e-cigarettes. If you need help quitting, ask your health care  provider. Summary Having a healthy lifestyle and getting preventive care can help to protect your health and wellness after age 62. Screening and testing are the best way to find a health problem early and help you avoid having a fall. Early diagnosis and treatment give you the best chance for managing medical conditions that are more common for people who are older than age 7. Falls are a major cause of broken bones and head injuries in people who are older than age 31. Take precautions to prevent a fall at home. Work with your health care provider to learn what changes you can make to improve your health and wellness and to prevent falls. This information is not intended to replace advice given to you by your health care provider. Make sure you discuss any questions you have with your healthcare provider. Document Revised: 08/02/2020 Document Reviewed: 08/02/2020 Elsevier Patient Education  2022 [place], MD [facility]

## 2021-04-08 NOTE — Assessment & Plan Note (Signed)
Well-controlled hypertension off medications. Well-controlled diabetes with hemoglobin A1c 5.8.  Continue once a day metformin 500 mg and once a day glipizide 5 mg. Diet and nutrition discussed. Follow-up in 6 months.

## 2021-04-08 NOTE — Assessment & Plan Note (Signed)
Diet and nutrition discussed.  Continue rosuvastatin 10 mg daily. The 10-year ASCVD risk score Mikey Bussing DC Brooke Bonito., et al., 2013) is: 25.6%   Values used to calculate the score:     Age: 70 years     Sex: Male     Is Non-Hispanic African American: No     Diabetic: Yes     Tobacco smoker: No     Systolic Blood Pressure: [id number] mmHg     Is BP treated: Yes     HDL Cholesterol: 48 mg/dL     Total Cholesterol: 135 mg/dL

## 2021-04-08 NOTE — Patient Instructions (Signed)

## 2021-04-09 ENCOUNTER — Encounter: Payer: Self-pay | Admitting: Emergency Medicine

## 2021-04-09 LAB — SARS-COV-2 ANTIBODY(IGG)SPIKE,SEMI-QUANTITATIVE: SARS COV1 AB(IGG)SPIKE,SEMI QN: >150 index — ABNORMAL HIGH (ref <1.00)

## 2021-04-23 ENCOUNTER — Encounter: Payer: Self-pay | Admitting: Emergency Medicine

## 2021-04-24 ENCOUNTER — Other Ambulatory Visit: Payer: Self-pay | Admitting: Emergency Medicine

## 2021-04-24 DIAGNOSIS — E1165 Type 2 diabetes mellitus with hyperglycemia: Secondary | ICD-10-CM

## 2021-04-25 NOTE — Telephone Encounter (Signed)
Medication refilled 04/24/21.

## 2021-06-03 ENCOUNTER — Encounter: Payer: Self-pay | Admitting: Emergency Medicine

## 2021-06-17 ENCOUNTER — Encounter: Payer: Self-pay | Admitting: Emergency Medicine

## 2021-06-17 DIAGNOSIS — L400 Psoriasis vulgaris: Secondary | ICD-10-CM | POA: Diagnosis not present

## 2021-06-17 DIAGNOSIS — L700 Acne vulgaris: Secondary | ICD-10-CM | POA: Diagnosis not present

## 2021-06-17 DIAGNOSIS — L814 Other melanin hyperpigmentation: Secondary | ICD-10-CM | POA: Diagnosis not present

## 2021-06-17 DIAGNOSIS — D225 Melanocytic nevi of trunk: Secondary | ICD-10-CM | POA: Diagnosis not present

## 2021-06-17 DIAGNOSIS — L821 Other seborrheic keratosis: Secondary | ICD-10-CM | POA: Diagnosis not present

## 2021-06-18 NOTE — Telephone Encounter (Signed)
Sent pt my chart message about flu shot.

## 2021-06-23 ENCOUNTER — Encounter: Payer: Self-pay | Admitting: Emergency Medicine

## 2021-06-26 NOTE — Telephone Encounter (Signed)
Sent pt my chart message regarding shingles information.

## 2021-06-28 DIAGNOSIS — Z23 Encounter for immunization: Secondary | ICD-10-CM | POA: Diagnosis not present

## 2021-07-09 ENCOUNTER — Ambulatory Visit: Payer: Medicare Other

## 2021-07-29 DIAGNOSIS — Z23 Encounter for immunization: Secondary | ICD-10-CM | POA: Diagnosis not present

## 2021-08-15 ENCOUNTER — Encounter: Payer: Self-pay | Admitting: Internal Medicine

## 2021-08-15 ENCOUNTER — Other Ambulatory Visit (INDEPENDENT_AMBULATORY_CARE_PROVIDER_SITE_OTHER): Payer: Medicare Other

## 2021-08-15 ENCOUNTER — Ambulatory Visit (INDEPENDENT_AMBULATORY_CARE_PROVIDER_SITE_OTHER): Payer: Medicare Other | Admitting: Internal Medicine

## 2021-08-15 ENCOUNTER — Other Ambulatory Visit: Payer: Self-pay

## 2021-08-15 VITALS — BP 138/86 | HR 83 | Temp 98.2°F | Ht 66.0 in | Wt 177.0 lb

## 2021-08-15 DIAGNOSIS — M546 Pain in thoracic spine: Secondary | ICD-10-CM

## 2021-08-15 DIAGNOSIS — E1159 Type 2 diabetes mellitus with other circulatory complications: Secondary | ICD-10-CM

## 2021-08-15 DIAGNOSIS — E1165 Type 2 diabetes mellitus with hyperglycemia: Secondary | ICD-10-CM

## 2021-08-15 DIAGNOSIS — I152 Hypertension secondary to endocrine disorders: Secondary | ICD-10-CM | POA: Diagnosis not present

## 2021-08-15 DIAGNOSIS — M545 Low back pain, unspecified: Secondary | ICD-10-CM

## 2021-08-15 DIAGNOSIS — R109 Unspecified abdominal pain: Secondary | ICD-10-CM | POA: Insufficient documentation

## 2021-08-15 LAB — URINALYSIS, ROUTINE W REFLEX MICROSCOPIC
Bilirubin Urine: NEGATIVE
Hgb urine dipstick: NEGATIVE
Ketones, ur: NEGATIVE
Leukocytes,Ua: NEGATIVE
Nitrite: NEGATIVE
RBC / HPF: NONE SEEN (ref 0–?)
Specific Gravity, Urine: 1.025 (ref 1.000–1.030)
Total Protein, Urine: NEGATIVE
Urine Glucose: NEGATIVE
Urobilinogen, UA: 0.2 (ref 0.0–1.0)
WBC, UA: NONE SEEN (ref 0–?)
pH: 7 (ref 5.0–8.0)

## 2021-08-15 LAB — BASIC METABOLIC PANEL
BUN: 19 mg/dL (ref 6–23)
CO2: 30 mEq/L (ref 19–32)
Calcium: 9.2 mg/dL (ref 8.4–10.5)
Chloride: 102 mEq/L (ref 96–112)
Creatinine, Ser: 0.95 mg/dL (ref 0.40–1.50)
GFR: 82.22 mL/min (ref >60.00)
Glucose, Bld: 122 mg/dL — ABNORMAL HIGH (ref 70–99)
Potassium: 3.7 mEq/L (ref 3.5–5.1)
Sodium: 138 mEq/L (ref 135–145)

## 2021-08-15 LAB — HEPATIC FUNCTION PANEL
ALT: 16 U/L (ref 0–53)
AST: 15 U/L (ref 0–37)
Albumin: 4.2 g/dL (ref 3.5–5.2)
Alkaline Phosphatase: 73 U/L (ref 39–117)
Bilirubin, Direct: 0.1 mg/dL (ref 0.0–0.3)
Total Bilirubin: 0.4 mg/dL (ref 0.2–1.2)
Total Protein: 7.3 g/dL (ref 6.0–8.3)

## 2021-08-15 LAB — CBC WITH DIFFERENTIAL/PLATELET
Basophils Absolute: 0 10*3/uL (ref 0.0–0.1)
Basophils Relative: 0.7 % (ref 0.0–3.0)
Eosinophils Absolute: 0 10*3/uL (ref 0.0–0.7)
Eosinophils Relative: 0.6 % (ref 0.0–5.0)
HCT: 45.7 % (ref 39.0–52.0)
Hemoglobin: 14.9 g/dL (ref 13.0–17.0)
Lymphocytes Relative: 31.8 % (ref 12.0–46.0)
Lymphs Abs: 2 10*3/uL (ref 0.7–4.0)
MCHC: 32.6 g/dL (ref 30.0–36.0)
MCV: 84.2 fl (ref 78.0–100.0)
Monocytes Absolute: 0.5 10*3/uL (ref 0.1–1.0)
Monocytes Relative: 7.8 % (ref 3.0–12.0)
Neutro Abs: 3.7 10*3/uL (ref 1.4–7.7)
Neutrophils Relative %: 59.1 % (ref 43.0–77.0)
Platelets: 240 10*3/uL (ref 150.0–400.0)
RBC: 5.42 Mil/uL (ref 4.22–5.81)
RDW: 13.7 % (ref 11.5–15.5)
WBC: 6.2 10*3/uL (ref 4.0–10.5)

## 2021-08-15 LAB — LIPASE: Lipase: 27 U/L (ref 11.0–59.0)

## 2021-08-15 MED ORDER — TIZANIDINE HCL 2 MG PO TABS: 2.0000 mg | ORAL_TABLET | Freq: Four times a day (QID) | ORAL | 1 refills | Status: DC | PRN

## 2021-08-15 MED ORDER — TRAMADOL HCL 50 MG PO TABS: 50.0000 mg | ORAL_TABLET | Freq: Four times a day (QID) | ORAL | 0 refills | Status: DC | PRN

## 2021-08-15 NOTE — Patient Instructions (Signed)
Please take all new medication as prescribed - the tramadol and muscle relaxer as needed  Please continue all other medications as before, and refills have been done if requested.  Please have the pharmacy call with any other refills you may need.  Please continue your efforts at being more active, low cholesterol diet, and weight control.  Please keep your appointments with your specialists as you may have planned  Please go to the XRAY Department for the x-ray testing at the [facility]  Please go to the LAB at the blood drawing area for the tests to be done at the [facility] will be contacted by phone if any changes need to be made immediately.  Otherwise, you will receive a letter about your results with an explanation, but please check with MyChart first.  Please remember to sign up for MyChart if you have not done so, as this will be important to you in the future with finding out test results, communicating by private email, and scheduling acute appointments online when needed.

## 2021-08-15 NOTE — Progress Notes (Signed)
Patient ID: Aaron [name], male   DOB: 02-Sep-1950, 70 y.o.   MRN: [id number]        Chief Complaint: follow up right flank pain x 2 wks       HPI:  Aaron [name] is a 70 y.o. male here with c/o  2 wks onset right flank pain, mild to mod but constant, nagging, dull, maybe getting some worse over this time; worse to lie on the right side, better to lie on the left side,  Pt denies chest pain, increased sob or doe, wheezing, orthopnea, PND, increased LE swelling, palpitations, dizziness or syncope.   Pt denies polydipsia, polyuria, and Denies urinary symptoms such as dysuria, frequency, urgency, flank pain, hematuria or n/v, fever, chills.  Denies worsening reflux, abd pain, dysphagia, n/v, bowel change or blood.        No hx of renal stone Wt Readings from Last 3 Encounters:  08/15/21 177 lb (80.3 kg)  04/08/21 161 lb (73 kg)  04/03/21 163 lb 6.4 oz (74.1 kg)   BP Readings from Last 3 Encounters:  08/15/21 138/86  04/08/21 124/68  04/03/21 118/80         Past Medical History:  Diagnosis Date   Diabetes mellitus without complication (HCC)    GERD (gastroesophageal reflux disease)    Hyperlipidemia    Hypertension    Psoriasis    History reviewed. No pertinent surgical history.  reports that he quit smoking about 37 years ago. His smoking use included cigarettes. He has never used smokeless tobacco. He reports current alcohol use of about 7.0 standard drinks per week. He reports that he does not use drugs. family history includes Cancer in his mother; Cancer (age of onset: 50) in his father. Allergies  Allergen Reactions   Erythromycin Itching   Levaquin [Levofloxacin In D5w]     Light sensitivity    Current Outpatient Medications on File Prior to Visit  Medication Sig Dispense Refill   amLODipine (NORVASC) 2.5 MG tablet Take 1 tablet (2.5 mg total) by mouth daily. 90 tablet 3   glipiZIDE (GLUCOTROL) 5 MG tablet TAKE 1 TABLET(5 MG) BY MOUTH DAILY BEFORE BREAKFAST 90 tablet 1    ibuprofen (ADVIL) 800 MG tablet Take 1 tablet (800 mg total) by mouth 3 (three) times daily. 21 tablet 0   metFORMIN (GLUCOPHAGE-XR) 500 MG 24 hr tablet TAKE 1 TABLET BY MOUTH EVERY DAY AT 8 PM 180 tablet 0   Multiple Vitamin (MULTI-VITAMIN PO) Take by mouth daily.     rosuvastatin (CRESTOR) 10 MG tablet Take 1 tablet (10 mg total) by mouth daily. 90 tablet 3   triamcinolone (KENALOG) 0.1 % Apply 1 application topically 2 (two) times daily. 30 g 3   vitamin B-12 (CYANOCOBALAMIN) 500 MCG tablet Take 500 mcg by mouth daily.     zolpidem (AMBIEN) 5 MG tablet Take 1 tablet (5 mg total) by mouth at bedtime as needed for sleep. 15 tablet 2   escitalopram (LEXAPRO) 10 MG tablet Take 1 tablet (10 mg total) by mouth daily. (Patient not taking: No sig reported) 90 tablet 1   No current facility-administered medications on file prior to visit.        ROS:  All others reviewed and negative.  Objective        PE:  BP 138/86    Pulse 83    Temp 98.2 F (36.8 C) (Oral)    Ht 5\' 6"  (1.676 m)    Wt 177 lb (80.3 kg)  SpO2 97%    BMI 28.57 kg/m                 Constitutional: Pt appears in NAD               HENT: Head: NCAT.                Right Ear: External ear normal.                 Left Ear: External ear normal.                Eyes: . Pupils are equal, round, and reactive to light. Conjunctivae and EOM are normal               Nose: without d/c or deformity               Neck: Neck supple. Gross normal ROM               Cardiovascular: Normal rate and regular rhythm.                 Pulmonary/Chest: Effort normal and breath sounds without rales or wheezing.                Abd:  Soft, NT, ND, + BS, no organomegaly, NO flank tender bilateral to palpate, t spine and ls spine nontender in midline               Neurological: Pt is alert. At baseline orientation, motor grossly intact               Skin: Skin is warm. No rashes, no other new lesions, LE edema - none               Psychiatric: Pt behavior  is normal without agitation   Micro: none  Cardiac tracings I have personally interpreted today:  none  Pertinent Radiological findings (summarize): none   Lab Results  Component Value Date   WBC 6.2 08/15/2021   HGB 14.9 08/15/2021   HCT 45.7 08/15/2021   PLT 240.0 08/15/2021   GLUCOSE 122 (H) 08/15/2021   CHOL 153 04/08/2021   TRIG 102.0 04/08/2021   HDL 43.80 04/08/2021   LDLCALC 89 04/08/2021   ALT 16 08/15/2021   AST 15 08/15/2021   NA 138 08/15/2021   K 3.7 08/15/2021   CL 102 08/15/2021   CREATININE 0.95 08/15/2021   BUN 19 08/15/2021   CO2 30 08/15/2021   TSH 2.750 04/30/2020   PSA 1.75 03/05/2014   HGBA1C 5.8 (A) 04/08/2021   MICROALBUR 0.88 04/06/2014   Assessment/Plan:  Aaron [name] is a 70 y.o. White or Caucasian [1] male with  has a past medical history of Diabetes mellitus without complication ([place]), GERD (gastroesophageal reflux disease), Hyperlipidemia, Hypertension, and Psoriasis.  Right flank pain Etiology unclear, but given age cant r/o underlying even malignancy though could be msk? ; to start will have basic labs including UA and cbc, also t spine and ls spine films; if negative would consider renal u/s vs Abd/pelvic CT as this pain seems atypical ; ok for tramadol prn and tizanidine prn for pain  Diabetes ([place]) Lab Results  Component Value Date   HGBA1C 5.8 (A) 04/08/2021   Stable, pt to continue current medical treatment metformin, glucotrol   Hypertension associated with diabetes ([place]) BP Readings from Last 3 Encounters:  08/15/21 138/86  04/08/21 124/68  04/03/21 118/80   Stable, pt to continue  medical treatment amlodipine  Followup: Return if symptoms worsen or fail to improve.  Cathlean Cower, MD 08/16/2021 5:34 AM [place]

## 2021-08-16 ENCOUNTER — Other Ambulatory Visit: Payer: Self-pay | Admitting: Internal Medicine

## 2021-08-16 ENCOUNTER — Encounter: Payer: Self-pay | Admitting: Internal Medicine

## 2021-08-16 DIAGNOSIS — R109 Unspecified abdominal pain: Secondary | ICD-10-CM

## 2021-08-16 NOTE — Assessment & Plan Note (Signed)
BP Readings from Last 3 Encounters:  08/15/21 138/86  04/08/21 124/68  04/03/21 118/80   Stable, pt to continue medical treatment amlodipine

## 2021-08-16 NOTE — Assessment & Plan Note (Signed)
Lab Results  Component Value Date   HGBA1C 5.8 (A) 04/08/2021   Stable, pt to continue current medical treatment metformin, glucotrol

## 2021-08-16 NOTE — Assessment & Plan Note (Addendum)
Etiology unclear, but given age cant r/o underlying even malignancy though could be msk? ; to start will have basic labs including UA and cbc, also t spine and ls spine films; if negative would consider renal u/s vs Abd/pelvic CT as this pain seems atypical ; ok for tramadol prn and tizanidine prn for pain

## 2021-08-19 NOTE — Telephone Encounter (Signed)
Patient states he can not accept calls on number left previously and is requesting a call back at   [phone]  *see below*

## 2021-08-19 NOTE — Telephone Encounter (Addendum)
Pt calling and asking if he still needs to get the x-ray, [place] or CT SCAN as he is confused on which imaging is needed.  Please clarify which imaging you are wanting pt to get.  Pt can be reached at [phone].

## 2021-08-21 ENCOUNTER — Ambulatory Visit (INDEPENDENT_AMBULATORY_CARE_PROVIDER_SITE_OTHER)
Admission: RE | Admit: 2021-08-21 | Discharge: 2021-08-21 | Disposition: A | Discharge status: Home / Self Care | Payer: Medicare Other | Source: Ambulatory Visit | Attending: Internal Medicine | Admitting: Internal Medicine

## 2021-08-21 ENCOUNTER — Ambulatory Visit
Admission: RE | Admit: 2021-08-21 | Discharge: 2021-08-21 | Disposition: A | Discharge status: Home / Self Care | Payer: Medicare Other | Source: Ambulatory Visit | Attending: Internal Medicine | Admitting: Internal Medicine

## 2021-08-21 ENCOUNTER — Other Ambulatory Visit: Payer: Self-pay

## 2021-08-21 DIAGNOSIS — M40204 Unspecified kyphosis, thoracic region: Secondary | ICD-10-CM | POA: Diagnosis not present

## 2021-08-21 DIAGNOSIS — R1031 Right lower quadrant pain: Secondary | ICD-10-CM | POA: Diagnosis not present

## 2021-08-21 DIAGNOSIS — R109 Unspecified abdominal pain: Secondary | ICD-10-CM | POA: Diagnosis not present

## 2021-08-21 DIAGNOSIS — M546 Pain in thoracic spine: Secondary | ICD-10-CM | POA: Diagnosis not present

## 2021-08-21 DIAGNOSIS — M545 Low back pain, unspecified: Secondary | ICD-10-CM

## 2021-08-21 NOTE — Telephone Encounter (Signed)
Spoke with patient 08/20/21

## 2021-08-22 ENCOUNTER — Encounter: Payer: Self-pay | Admitting: Internal Medicine

## 2021-09-18 ENCOUNTER — Encounter: Payer: Self-pay | Admitting: Emergency Medicine

## 2021-09-20 ENCOUNTER — Other Ambulatory Visit: Payer: Self-pay | Admitting: Emergency Medicine

## 2021-09-20 DIAGNOSIS — H9313 Tinnitus, bilateral: Secondary | ICD-10-CM

## 2021-09-20 NOTE — Telephone Encounter (Signed)
Needs ENT follow-up.  Referral placed.

## 2021-09-22 NOTE — Telephone Encounter (Signed)
Patient following up on mychart message  Informed patient provider has put in a referral to an ENT, advised pt once referral is processed, the ENT office will call him to schedule the appt

## 2021-09-24 ENCOUNTER — Other Ambulatory Visit: Payer: Self-pay | Admitting: Emergency Medicine

## 2021-09-24 DIAGNOSIS — E1165 Type 2 diabetes mellitus with hyperglycemia: Secondary | ICD-10-CM

## 2021-10-01 ENCOUNTER — Encounter: Payer: Self-pay | Admitting: Emergency Medicine

## 2021-10-04 NOTE — Telephone Encounter (Signed)
Blood work results looked okay as reviewed by Dr. Jenny Reichmann.  These results were discussed with patient's wife by staff last December.  Continue monitoring blood pressure before starting medication.  Stop rosuvastatin for now.  Thanks.

## 2021-10-06 NOTE — Telephone Encounter (Signed)
Pt checking status of mychart response, informed pt of provider's recommendation  Pt stated he does not have a bp monitor, pt inquiring if he should continue taking amlodipine due to not having a monitor  Pt states if he can not be reached on home phone, please respond on mychart w/ provider's recommendation

## 2021-10-07 NOTE — Telephone Encounter (Signed)
Called patient regarding monitoring his blood pressure. Informed patient to call his pharmacy to see what blood pressure cuff is covered by his insurance. Patient will call with this information so we can send in a prescription for a blood pressure cuff.

## 2021-10-08 DIAGNOSIS — K5900 Constipation, unspecified: Secondary | ICD-10-CM | POA: Diagnosis not present

## 2021-10-08 DIAGNOSIS — E119 Type 2 diabetes mellitus without complications: Secondary | ICD-10-CM | POA: Diagnosis not present

## 2021-10-08 DIAGNOSIS — L290 Pruritus ani: Secondary | ICD-10-CM | POA: Diagnosis not present

## 2021-10-08 DIAGNOSIS — Z86010 Personal history of colonic polyps: Secondary | ICD-10-CM | POA: Diagnosis not present

## 2021-10-09 DIAGNOSIS — K219 Gastro-esophageal reflux disease without esophagitis: Secondary | ICD-10-CM | POA: Diagnosis not present

## 2021-10-09 DIAGNOSIS — H903 Sensorineural hearing loss, bilateral: Secondary | ICD-10-CM | POA: Diagnosis not present

## 2021-10-09 DIAGNOSIS — H9313 Tinnitus, bilateral: Secondary | ICD-10-CM | POA: Diagnosis not present

## 2021-11-05 DIAGNOSIS — L290 Pruritus ani: Secondary | ICD-10-CM | POA: Diagnosis not present

## 2021-11-11 DIAGNOSIS — E119 Type 2 diabetes mellitus without complications: Secondary | ICD-10-CM | POA: Diagnosis not present

## 2021-11-11 LAB — HM DIABETES EYE EXAM

## 2021-11-20 DIAGNOSIS — D23121 Other benign neoplasm of skin of left upper eyelid, including canthus: Secondary | ICD-10-CM | POA: Diagnosis not present

## 2021-11-25 ENCOUNTER — Encounter: Payer: Self-pay | Admitting: Family Medicine

## 2021-11-25 ENCOUNTER — Telehealth: Payer: Self-pay | Admitting: Emergency Medicine

## 2021-11-25 NOTE — Telephone Encounter (Signed)
Pharmacy called to give Lot # for last COVID booster.  ? ?Lot #- [id number], was given on 11.29.2022. ?

## 2021-12-02 ENCOUNTER — Encounter: Payer: Self-pay | Admitting: Emergency Medicine

## 2021-12-02 ENCOUNTER — Ambulatory Visit (INDEPENDENT_AMBULATORY_CARE_PROVIDER_SITE_OTHER): Payer: Medicare Other | Admitting: Emergency Medicine

## 2021-12-02 VITALS — BP 118/68 | HR 78 | Temp 98.0°F | Ht 66.0 in | Wt 178.1 lb

## 2021-12-02 DIAGNOSIS — E785 Hyperlipidemia, unspecified: Secondary | ICD-10-CM

## 2021-12-02 DIAGNOSIS — E1159 Type 2 diabetes mellitus with other circulatory complications: Secondary | ICD-10-CM | POA: Diagnosis not present

## 2021-12-02 DIAGNOSIS — K130 Diseases of lips: Secondary | ICD-10-CM

## 2021-12-02 DIAGNOSIS — I152 Hypertension secondary to endocrine disorders: Secondary | ICD-10-CM | POA: Diagnosis not present

## 2021-12-02 DIAGNOSIS — E1169 Type 2 diabetes mellitus with other specified complication: Secondary | ICD-10-CM | POA: Diagnosis not present

## 2021-12-02 DIAGNOSIS — H9313 Tinnitus, bilateral: Secondary | ICD-10-CM | POA: Diagnosis not present

## 2021-12-02 MED ORDER — ACYCLOVIR 400 MG PO TABS: ORAL_TABLET | ORAL | 1 refills | Status: DC

## 2021-12-02 NOTE — Progress Notes (Signed)
Aaron [name] ?71 y.o. ? ? ?Chief Complaint  ?Patient presents with  ? sore on lip   ?  Sore on inner side of top lip, feels like tiny blisters. Noticed it about a week ago  ? ? ?HISTORY OF PRESENT ILLNESS: ?This is a 71 y.o. male complaining of sores to inside of upper lip that he noticed 1 week ago. ?Used coconut oil.  Better today. ?No other complaints or medical concerns today. ?Also has history of tinnitus with chronic headaches.  Was able to see ENT doctor and will follow-up with neurologist as well.  Stable. ? ?HPI ? ? ?Prior to Admission medications   ?Medication Sig Start Date End Date Taking? Authorizing Provider  ?acyclovir (ZOVIRAX) 400 MG tablet Take 1 tablet 3 times a day for 3 days at onset of sores 12/02/21  Yes Aunya Lemler, Ines Bloomer, MD  ?metFORMIN (GLUCOPHAGE-XR) 500 MG 24 hr tablet TAKE 1 TABLET BY MOUTH EVERY DAY AT 8PM 09/24/21  Yes Remer Couse, Ines Bloomer, MD  ?Multiple Vitamin (MULTI-VITAMIN PO) Take by mouth daily.   Yes [provider]  ?amLODipine (NORVASC) 2.5 MG tablet Take 1 tablet (2.5 mg total) by mouth daily. ?Patient not taking: Reported on 12/02/2021 07/05/20   Minus Breeding, MD  ?escitalopram (LEXAPRO) 10 MG tablet Take 1 tablet (10 mg total) by mouth daily. ?Patient not taking: No sig reported 01/27/21 04/27/21  [name], MD  ?glipiZIDE (GLUCOTROL) 5 MG tablet TAKE 1 TABLET(5 MG) BY MOUTH DAILY BEFORE BREAKFAST ?Patient not taking: Reported on 12/02/2021 12/21/20   [name], MD  ?ibuprofen (ADVIL) 800 MG tablet Take 1 tablet (800 mg total) by mouth 3 (three) times daily. ?Patient not taking: Reported on 12/02/2021 02/26/21   Pearson Forster, NP  ?rosuvastatin (CRESTOR) 10 MG tablet Take 1 tablet (10 mg total) by mouth daily. ?Patient not taking: Reported on 12/02/2021 06/12/20   [name], MD  ?tiZANidine (ZANAFLEX) 2 MG tablet Take 1 tablet (2 mg total) by mouth every 6 (six) hours as needed for muscle spasms. ?Patient not taking: Reported on  12/02/2021 08/15/21   Biagio Borg, MD  ?traMADol (ULTRAM) 50 MG tablet Take 1 tablet (50 mg total) by mouth every 6 (six) hours as needed. ?Patient not taking: Reported on 12/02/2021 08/15/21   Biagio Borg, MD  ?triamcinolone (KENALOG) 0.1 % Apply 1 application topically 2 (two) times daily. ?Patient not taking: Reported on 12/02/2021 11/12/20   [name], MD  ?vitamin B-12 (CYANOCOBALAMIN) 500 MCG tablet Take 500 mcg by mouth daily. ?Patient not taking: Reported on 12/02/2021    [provider]  ?zolpidem (AMBIEN) 5 MG tablet Take 1 tablet (5 mg total) by mouth at bedtime as needed for sleep. ?Patient not taking: Reported on 12/02/2021 12/11/20   [name], MD  ? ? ?Allergies  ?Allergen Reactions  ? Erythromycin Itching  ? Levaquin [Levofloxacin In D5w]   ?  Light sensitivity ?  ? ? ?Patient Active Problem List  ? Diagnosis Date Noted  ? Diabetes ([place]) 03/01/2021  ? Chronic anxiety 01/08/2021  ? Raynaud's phenomenon without gangrene 01/08/2021  ? Other psoriasis 10/01/2020  ? Coronary artery calcification 06/12/2020  ? Hypertension associated with diabetes ([place]) 05/29/2019  ? Gastroesophageal reflux disease without esophagitis 03/13/2017  ? Dyslipidemia associated with type 2 diabetes mellitus ([place]) 03/13/2017  ? ? ?Past Medical History:  ?Diagnosis Date  ? Diabetes mellitus without complication ([place])   ? GERD (gastroesophageal reflux disease)   ?  Hyperlipidemia   ? Hypertension   ? Psoriasis   ? ? ?History reviewed. No pertinent surgical history. ? ?Social History  ? ?Socioeconomic History  ? Marital status: Married  ?  Spouse name: Not on file  ? Number of children: 0  ? Years of education: Not on file  ? Highest education level: High school graduate  ?Occupational History  ? Occupation: Press photographer, retired   ?Tobacco Use  ? Smoking status: Former  ?  Types: Cigarettes  ?  Quit date: 93  ?  Years since quitting: 38.2  ? Smokeless tobacco: Never  ?Vaping Use  ? Vaping Use: Never used   ?Substance and Sexual Activity  ? Alcohol use: Yes  ?  Alcohol/week: 7.0 standard drinks  ?  Types: 7 Cans of beer per week  ?  Comment: 1 beer with dinner usually daily  ? Drug use: No  ? Sexual activity: Yes  ?Other Topics Concern  ? Not on file  ?Social History Narrative  ? Marital status:  Married.   ?   ?   ?    Children:   none  ?    Lives:  ?    Employment:  sales  ?    Tobacco:  none  ?    Alcohol:  About 7 drinks per week  ?    Drugs:   none  ?    Exercise:  ?    Seatbelt:   ?   Education: [employer].  ?   ? Lives at home with wife  ? Right handed  ? Caffeine: 2 cups/day  ? ?Social Determinants of Health  ? ?Financial Resource Strain: Low Risk   ? Difficulty of Paying Living Expenses: Not hard at all  ?Food Insecurity: No Food Insecurity  ? Worried About Charity fundraiser in the Last Year: Never true  ? Ran Out of Food in the Last Year: Never true  ?Transportation Needs: No Transportation Needs  ? Lack of Transportation (Medical): No  ? Lack of Transportation (Non-Medical): No  ?Physical Activity: Sufficiently Active  ? Days of Exercise per Week: 5 days  ? Minutes of Exercise per Session: 30 min  ?Stress: No Stress Concern Present  ? Feeling of Stress : Not at all  ?Social Connections: Socially Integrated  ? Frequency of Communication with Friends and Family: More than three times a week  ? Frequency of Social Gatherings with Friends and Family: More than three times a week  ? Attends Religious Services: 1 to 4 times per year  ? Active Member of Clubs or Organizations: Yes  ? Attends Archivist Meetings: 1 to 4 times per year  ? Marital Status: Married  ?Intimate Partner Violence: Not At Risk  ? Fear of Current or Ex-Partner: No  ? Emotionally Abused: No  ? Physically Abused: No  ? Sexually Abused: No  ? ? ?Family History  ?Problem Relation Age of Onset  ? Cancer Mother   ?     breast ca spread to bone  ? Cancer Father 43  ?     brain cancer  ? Migraines Neg Hx   ? Headache Neg Hx    ? ? ? ?Review of Systems  ?Constitutional: Negative.   ?HENT:  Positive for tinnitus.   ?Cardiovascular: Negative.  Negative for chest pain.  ?Gastrointestinal:  Negative for nausea and vomiting.  ?Genitourinary: Negative.   ?Skin: Negative.  Negative for rash.  ?Neurological:  Positive for headaches.  ?All  other systems reviewed and are negative. ? ?Today's Vitals  ? 12/02/21 1038  ?BP: 118/68  ?Pulse: 78  ?Temp: 98 ?F (36.7 ?C)  ?TempSrc: Oral  ?SpO2: 95%  ?Weight: 178 lb 2 oz (80.8 kg)  ?Height: '5\' 6"'$  (1.676 m)  ? ?Body mass index is 28.75 kg/m?. ?Wt Readings from Last 3 Encounters:  ?12/02/21 178 lb 2 oz (80.8 kg)  ?08/15/21 177 lb (80.3 kg)  ?04/08/21 161 lb (73 kg)  ? ? ?Physical Exam ?Vitals reviewed.  ?Constitutional:   ?   Appearance: Normal appearance.  ?HENT:  ?   Head: Normocephalic.  ?   Mouth/Throat:  ?   Comments: Shallow small ulcers to inner upper lip.  Better according to patient. ?Eyes:  ?   Extraocular Movements: Extraocular movements intact.  ?Cardiovascular:  ?   Rate and Rhythm: Normal rate.  ?Pulmonary:  ?   Effort: Pulmonary effort is normal.  ?Musculoskeletal:  ?   Cervical back: No tenderness.  ?Lymphadenopathy:  ?   Cervical: No cervical adenopathy.  ?Skin: ?   General: Skin is warm and dry.  ?Neurological:  ?   General: No focal deficit present.  ?   Mental Status: He is alert and oriented to person, place, and time.  ?Psychiatric:     ?   Mood and Affect: Mood normal.     ?   Behavior: Behavior normal.  ? ? ? ?ASSESSMENT & PLAN: ?Problem List Items Addressed This Visit   ? ?  ? Cardiovascular and Mediastinum  ? Hypertension associated with diabetes ([place])  ?  Well-controlled hypertension.  Off medications ?BP Readings from Last 3 Encounters:  ?12/02/21 118/68  ?08/15/21 138/86  ?04/08/21 124/68  ?Well-controlled diabetes ?Continue metformin 500 mg twice a day. ? ? ?  ?  ?  ? Digestive  ? Sore of lip - Primary  ?  Canker sores most likely. ?Advised to start acyclovir by mouth at the  onset of sores in the future. ?  ?  ? Relevant Medications  ? acyclovir (ZOVIRAX) 400 MG tablet  ?  ? Endocrine  ? Dyslipidemia associated with type 2 diabetes mellitus ([place])  ?  Stable.  Continue rosuvastatin 10 mg daily. ?Terie Purser

## 2021-12-02 NOTE — Assessment & Plan Note (Signed)
Well-controlled hypertension.  Off medications ?BP Readings from Last 3 Encounters:  ?12/02/21 118/68  ?08/15/21 138/86  ?04/08/21 124/68  ?Well-controlled diabetes ?Continue metformin 500 mg twice a day. ? ? ?

## 2021-12-02 NOTE — Patient Instructions (Signed)
Canker Sores ?Canker sores are small, painful sores that develop inside your mouth. You can get one or more canker sores on the inside of your lips or cheeks, on your tongue, or anywhere inside your mouth. Canker sores cannot be passed from person to person (are not contagious). These sores are different from the sores that you may get on the outside of your lips (cold sores or fever blisters). ?What are the causes? ?The cause of this condition is not known. The condition may be passed down from a parent (genetic). ?What increases the risk? ?This condition is more likely to develop in: ?Women. ?People in their teens or 30s. ?Women who are having their menstrual period. ?People who are under a lot of emotional stress. ?People who do not get enough iron or B vitamins. ?People who do not take care of their mouth and teeth (have poor oral hygiene). ?People who have an injury inside the mouth, such as after having dental work or from chewing something hard. ?What are the signs or symptoms? ?Canker sores usually start as painful red bumps. Then they turn into small white, yellow, or gray sores that have red borders. The sores may be painful, and the pain may get worse when you eat or drink. Along with the canker sore, symptoms may also include: ?Fever. ?Fatigue. ?Swollen lymph nodes in your neck. ?How is this diagnosed? ?This condition may be diagnosed based on your symptoms and an exam of the inside of your mouth. If you get canker sores often or if they are very bad, you may have tests, such as: ?Blood tests to rule out possible causes. ?Swabbing a fluid sample from the sore to be tested for infection. ?Removing a small tissue sample from the sore (biopsy) to test it for cancer. ?How is this treated? ?Most canker sores go away without treatment in about 1 week. Home care is usually the only treatment that you will need. Over-the-counter medicines can relieve discomfort. If you have severe canker sores, your health care  provider may prescribe: ?Numbing ointment to relieve pain. ?Do not use numbing gels or products containing benzocaine in children who are 69 years of age or younger. ?Vitamins. ?Steroid medicines. These may be given as pills, mouth rinses, or gels. ?Antibiotic mouth rinse. ?Follow these instructions at home: ? ?Apply, take, or use over-the-counter and prescription medicines only as told by your health care provider. These include vitamins and ointments. ?If you were prescribed an antibiotic mouth rinse, use it as told by your health care provider. Do not stop using the antibiotic even if your condition improves. ?Until the sores are healed: ?Do not drink coffee or citrus juices. ?Do not eat spicy or salty foods. ?Use a mild, over-the-counter mouth rinse as recommended by your health care provider. ?Practice good oral hygiene by: ?Flossing your teeth every day. ?Brushing your teeth with a soft toothbrush twice each day. ?Contact a health care provider if: ?Your symptoms do not get better after 2 weeks. ?You also have a fever or swollen glands in your neck. ?You get canker sores often. ?You have a canker sore that is getting larger. ?You cannot eat or drink due to your canker sores. ?Summary ?Canker sores are small, painful sores that develop inside your mouth. ?Canker sores usually start as painful red bumps that turn into small white, yellow, or gray sores that have red borders. ?The sores may be quite painful, and the pain may get worse when you eat or drink. ?Most canker sores  clear up without treatment in about 1 week. Over-the-counter medicines can relieve discomfort. ?This information is not intended to replace advice given to you by your health care provider. Make sure you discuss any questions you have with your health care provider. ?Document Revised: 02/20/2021 Document Reviewed: 05/10/2020 ?Elsevier Patient Education ? [place]. ? ?

## 2021-12-02 NOTE — Assessment & Plan Note (Signed)
Canker sores most likely. ?Advised to start acyclovir by mouth at the onset of sores in the future. ?

## 2021-12-02 NOTE — Assessment & Plan Note (Signed)
Recent testing showed sensorineural hearing deficit. ?Was referred to audiology and will also follow-up with neurology. ?

## 2021-12-02 NOTE — Assessment & Plan Note (Addendum)
Stable.  Continue rosuvastatin 10 mg daily. ?The 10-year ASCVD risk score (Arnett DK, et al., 2019) is: 32.8% ?  Values used to calculate the score: ?    Age: 71 years ?    Sex: Male ?    Is Non-Hispanic African American: No ?    Diabetic: Yes ?    Tobacco smoker: Yes ?    Systolic Blood Pressure: 035 mmHg ?    Is BP treated: Yes ?    HDL Cholesterol: 43.8 mg/dL ?    Total Cholesterol: 153 mg/dL ? ?

## 2021-12-03 ENCOUNTER — Other Ambulatory Visit: Payer: Self-pay | Admitting: Emergency Medicine

## 2021-12-03 DIAGNOSIS — R29818 Other symptoms and signs involving the nervous system: Secondary | ICD-10-CM

## 2021-12-03 NOTE — Telephone Encounter (Signed)
Referral placed for sleep studies.  Thanks.

## 2021-12-09 NOTE — Progress Notes (Signed)
? ? ?Chief Complaint  ?Patient presents with  ? Follow-up  ?  Rm 13, alone. Pt continues to have daily HA. Takes Excedrin for really bad HA. Has tinnitus. Would like to discuss possible sleep apnea. Pt has been using a pulse oxi to monitor sleep.   ? ? ?HISTORY OF PRESENT ILLNESS: ? ?12/10/21 ALL:  ?Rush Landmark returns for follow up for headaches. He was last seen 12/2020 and doing fairly well. We discussed starting topiramate but he was hesitant. PCP had started Ambien around that time. He was unable to tolerate and discontinued. He reports getting Covid 02/2021. He was pretty ill for about 2 months. Since, headaches have worsened. He has a daily headache. Pain is described as a pressure sensation. No light sensitivity. Possible sound sensitivity with bad headaches. No nausea or worsening with activity. He wakes with headache every day. Headache seems to be better during the day. He feels that he is able to distract himself. He takes Excedrin 2 tabs 2-3 times a month for severe headaches. He drinks about 40-60 ounces of water daily.  ? ?He reports tinnitus started about 2 months ago. He was seen by ENT. He has concerns of possible sleep apnea. ENT and audiology and diagnosed with mild hearing loss, otherwise normal evaluation. He purchased an [employer] continuous pulse oximeter.  that indicated multiple desaturations (nadir 84%). It is unclear how long O2 remains below 88% but appears to be around 10 minutes. PCP sent sleep referral 12/03/2020. He does not snore. He denies significant daytime sleepiness. No family history of sleep apnea. No obvious concerns of apneic events. He is retired. He does chores around the home most days. He may nap once a day for about 30-60 minutes. He sleeps fairly well at night. It may take a little while to get to sleep (about an hour) but he sleeps well when he does. Usually bedtime is around 12am and wakes around 8-9am. He does not feel refreshed when he wakes. He wakes with dry mouth on occasion.   ? ?01/09/2021 ALL:  ?He returns for follow up for headaches. We started propranolol in 10/2020. He was concerned about increased sleepiness and dose was switched to ER. He called the office yesterday with concerns of cold hands and feet and reported PCP wanted him to stop propranolol for concerns of Raynaud's. Headaches were unchanged and no improvement noted. He has had more anxiety. PCP recommended he see psychiatry. He reports that his wife feels that he is a hypochondriac. He was recently started on Ambien '5mg'$  due to insomnia. He has tolerated well for the past month. It is unclear if it is helping. He feels that he has trouble remembering things over the past few months. He has been seen by rheumatology, ophthalmology, cardiology for various concerns. All workup has been unremarkable.  ? ?10/30/2020 ALL:  ?LASH MATULICH is a 71 y.o. male here today for follow up for headaches. He was seen by Dr Jaynee Eagles  07/2019 and 09/2019. MRI was normal. Headaches were thought to be from clenching. He had used mouth guard and flexeril which helped. Sleep study also recommended but was not pursued.  He had a CT with PCP 10/2020 following an accident at home where he hit his head. CT was normal. He reports that headaches continue. Headache is constant. Pain is usually on the top of his head. No migraine symptoms. Relieved when he pushes on temples. His wife says he does not snore. He has lost 40 pounds. He takes  meloxicam daily for back pain. He has taken Excedrin PM in the past but not since starting meloxicam. He has not been on prevention medications. He is followed by PCP for intermittent dizziness. Previous cardiology workup normal.  ? ?HISTORY (copied from Dr Cathren Laine previous note) ? ?Interval history 07/11/2020: He saw the dentist and he is clenching and grinding at night, he is going to get a mouthguard. No changes since he has been seen. He is still waking up with the headache. Will add Flexeril at bedtime for teeth  clenching. We discussed a plan. ?  ?HPI:  MAGGIE DWORKIN is a 71 y.o. male here as requested by [name], * for persistent headache as well as headaches and myalgias.  I reviewed Dr. Barry Brunner notes.  Patient has muscle pain that started 2 weeks prior to last appointment which was May 29, 2019.  He has a past medical history of diabetes on Metformin and glipizide, hypertension on Norvasc 5 mg, hyperlipidemia on a statin.  Symptoms included upper and lower extremity muscle pain, no injuries, and "stress headaches" 3-4 times weekly this started in January.  No history of migraine headaches.  No other associated symptoms, no abdominal pain, chest pain, fever, nausea, rash, shortness of breath or vomiting, coughing, dizziness, fever.  Examination was normal including physical examination and neurologic exam was nonfocal.  CBC 1 month ago showed elevated white blood cells.  Hemoglobin A1c 6.23-monthago, 6 months ago was 8.7.  CMP unremarkable. ?  ?He is here alone. One day a few months ago he had a headache. It was consistent, about March or April. He went to urgent care, neg for covid19, he was started on fioricet and it did not help. No hx of migraines. Excedrin did not help. Feels like he is wearing a hat that is on tight. It comes and goes. He notices it in the morning it gets better when he gets to work. Or with laying in bed. No throbbing. He rarely had headaches in the past in the temples but this is different. Can last for hours. Hasn't progressed or improved. Coffee maybe helps. Unknown triggers. Annoying not excruciating. No vision problems or vision changes. No hearing changes. He gets muscle aches in the arms, no weakness, started prior to the headache, he has some cervical neck pain, no radicular symptoms, he has low back pain that is long-term and stable. He has some numbness and tingling in his feet but not frequently. He denies snoring or excessive fatigue during the day, he doesn't  complain about not feeling refreshed. No associated light or sound sensitivity, no migraines. He stopped the statin in September still symptomatic. No fevers. Some stiffness in the neck but no deficits of range of motion.  ?  ?Reviewed notes, labs and imaging from outside physicians, which showed:  ?  ?Reviewed XR cervical spine images and agree: INGS: ?Vertebral body height and alignment are maintained. Loss of disc ?space height and endplate spurring are seen at C5-6. The C7-T1 level ?is not visualized has no swimmer's view is provided. ?  ?IMPRESSION: ?C5-6 degenerative disc disease. ? ? ?REVIEW OF SYSTEMS: Out of a complete 14 system review of symptoms, the patient complains only of the following symptoms, headaches, dizziness and all other reviewed systems are negative. ? ? ?ESS: 4 ? ? ?ALLERGIES: ?Allergies  ?Allergen Reactions  ? Erythromycin Itching  ? Levaquin [Levofloxacin In D5w]   ?  Light sensitivity ?  ? ? ? ?HOME MEDICATIONS: ?Outpatient Medications  Prior to Visit  ?Medication Sig Dispense Refill  ? metFORMIN (GLUCOPHAGE-XR) 500 MG 24 hr tablet TAKE 1 TABLET BY MOUTH EVERY DAY AT 8PM 180 tablet 0  ? Multiple Vitamin (MULTI-VITAMIN PO) Take by mouth daily.    ? triamcinolone (KENALOG) 0.1 % Apply 1 application topically 2 (two) times daily. 30 g 3  ? vitamin B-12 (CYANOCOBALAMIN) 500 MCG tablet Take 500 mcg by mouth daily.    ? acyclovir (ZOVIRAX) 400 MG tablet Take 1 tablet 3 times a day for 3 days at onset of sores 30 tablet 1  ? amLODipine (NORVASC) 2.5 MG tablet Take 1 tablet (2.5 mg total) by mouth daily. (Patient not taking: Reported on 12/02/2021) 90 tablet 3  ? escitalopram (LEXAPRO) 10 MG tablet Take 1 tablet (10 mg total) by mouth daily. (Patient not taking: No sig reported) 90 tablet 1  ? glipiZIDE (GLUCOTROL) 5 MG tablet TAKE 1 TABLET(5 MG) BY MOUTH DAILY BEFORE BREAKFAST (Patient not taking: Reported on 12/02/2021) 90 tablet 1  ? ibuprofen (ADVIL) 800 MG tablet Take 1 tablet (800 mg total) by  mouth 3 (three) times daily. (Patient not taking: Reported on 12/02/2021) 21 tablet 0  ? rosuvastatin (CRESTOR) 10 MG tablet Take 1 tablet (10 mg total) by mouth daily. (Patient not taking: Reported on 4/4/

## 2021-12-10 ENCOUNTER — Ambulatory Visit (INDEPENDENT_AMBULATORY_CARE_PROVIDER_SITE_OTHER): Payer: Medicare Other | Admitting: Family Medicine

## 2021-12-10 ENCOUNTER — Encounter: Payer: Self-pay | Admitting: Family Medicine

## 2021-12-10 VITALS — BP 146/76 | HR 88 | Ht 66.0 in | Wt 177.0 lb

## 2021-12-10 DIAGNOSIS — G44229 Chronic tension-type headache, not intractable: Secondary | ICD-10-CM | POA: Diagnosis not present

## 2021-12-10 NOTE — Patient Instructions (Signed)
Below is our plan: ? ?We will continue to monitor headaches for now. Can consider topiramate in the future. We will get you in with sleep med for a sleep evaluation.  ? ?Please make sure you are staying well hydrated. I recommend 50-60 ounces daily. Well balanced diet and regular exercise encouraged. Consistent sleep schedule with 6-8 hours recommended.  ? ?Please continue follow up with care team as directed.  ? ?Follow up with sleep medicine pending appt, I will see you in follow up pending their evaluation.  ? ?You may receive a survey regarding today's visit. I encourage you to leave honest feed back as I do use this information to improve patient care. Thank you for seeing me today!  ? ? ?

## 2021-12-15 ENCOUNTER — Encounter: Payer: Self-pay | Admitting: Family Medicine

## 2021-12-15 NOTE — Telephone Encounter (Signed)
Aaron [name] is checking on this since she handles all of our sleep referrals.  ?

## 2021-12-18 ENCOUNTER — Encounter: Payer: Self-pay | Admitting: Family Medicine

## 2021-12-23 ENCOUNTER — Ambulatory Visit (INDEPENDENT_AMBULATORY_CARE_PROVIDER_SITE_OTHER): Payer: Medicare Other | Admitting: Neurology

## 2021-12-23 ENCOUNTER — Encounter: Payer: Self-pay | Admitting: Neurology

## 2021-12-23 VITALS — BP 148/78 | HR 82 | Ht 66.0 in | Wt 176.5 lb

## 2021-12-23 DIAGNOSIS — G4763 Sleep related bruxism: Secondary | ICD-10-CM

## 2021-12-23 DIAGNOSIS — G478 Other sleep disorders: Secondary | ICD-10-CM | POA: Diagnosis not present

## 2021-12-23 DIAGNOSIS — R519 Headache, unspecified: Secondary | ICD-10-CM | POA: Diagnosis not present

## 2021-12-23 DIAGNOSIS — R5382 Chronic fatigue, unspecified: Secondary | ICD-10-CM | POA: Diagnosis not present

## 2021-12-23 DIAGNOSIS — H9313 Tinnitus, bilateral: Secondary | ICD-10-CM | POA: Diagnosis not present

## 2021-12-23 NOTE — Patient Instructions (Signed)
Insomnia ?Insomnia is a sleep disorder that makes it difficult to fall asleep or stay asleep. Insomnia can cause fatigue, low energy, difficulty concentrating, mood swings, and poor performance at work or school. ?There are three different ways to classify insomnia: ?Difficulty falling asleep. ?Difficulty staying asleep. ?Waking up too early in the morning. ?Any type of insomnia can be long-term (chronic) or short-term (acute). Both are common. Short-term insomnia usually lasts for 3 months or less. Chronic insomnia occurs at least three times a week for longer than 3 months. ?What are the causes? ?Insomnia may be caused by another condition, situation, or substance, such as: ?Having certain mental health conditions, such as anxiety and depression. ?Using caffeine, alcohol, tobacco, or drugs. ?Having gastrointestinal conditions, such as gastroesophageal reflux disease (GERD). ?Having certain medical conditions. These include: ?Asthma. ?Alzheimer's disease. ?Stroke. ?Chronic pain. ?An overactive thyroid gland (hyperthyroidism). ?Other sleep disorders, such as restless legs syndrome and sleep apnea. ?Menopause. ?Sometimes, the cause of insomnia may not be known. ?What increases the risk? ?Risk factors for insomnia include: ?Gender. Females are affected more often than males. ?Age. Insomnia is more common as people get older. ?Stress and certain medical and mental health conditions. ?Lack of exercise. ?Having an irregular work schedule. This may include working night shifts and traveling between different time zones. ?What are the signs or symptoms? ?If you have insomnia, the main symptom is having trouble falling asleep or having trouble staying asleep. This may lead to other symptoms, such as: ?Feeling tired or having low energy. ?Feeling nervous about going to sleep. ?Not feeling rested in the morning. ?Having trouble concentrating. ?Feeling irritable, anxious, or depressed. ?How is this diagnosed? ?This condition  may be diagnosed based on: ?Your symptoms and medical history. Your health care provider may ask about: ?Your sleep habits. ?Any medical conditions you have. ?Your mental health. ?A physical exam. ?How is this treated? ?Treatment for insomnia depends on the cause. Treatment may focus on treating an underlying condition that is causing the insomnia. Treatment may also include: ?Medicines to help you sleep. ?Counseling or therapy. ?Lifestyle adjustments to help you sleep better. ?Follow these instructions at home: ?Eating and drinking ? ?Limit or avoid alcohol, caffeinated beverages, and products that contain nicotine and tobacco, especially close to bedtime. These can disrupt your sleep. ?Do not eat a large meal or eat spicy foods right before bedtime. This can lead to digestive discomfort that can make it hard for you to sleep. ?Sleep habits ? ?Keep a sleep diary to help you and your health care provider figure out what could be causing your insomnia. Write down: ?When you sleep. ?When you wake up during the night. ?How well you sleep and how rested you feel the next day. ?Any side effects of medicines you are taking. ?What you eat and drink. ?Make your bedroom a dark, comfortable place where it is easy to fall asleep. ?Put up shades or blackout curtains to block light from outside. ?Use a white noise machine to block noise. ?Keep the temperature cool. ?Limit screen use before bedtime. This includes: ?Not watching TV. ?Not using your smartphone, tablet, or computer. ?Stick to a routine that includes going to bed and waking up at the same times every day and night. This can help you fall asleep faster. Consider making a quiet activity, such as reading, part of your nighttime routine. ?Try to avoid taking naps during the day so that you sleep better at night. ?Get out of bed if you are still awake after  15 minutes of trying to sleep. Keep the lights down, but try reading or doing a quiet activity. When you feel  sleepy, go back to bed. ?General instructions ?Take over-the-counter and prescription medicines only as told by your health care provider. ?Exercise regularly as told by your health care provider. However, avoid exercising in the hours right before bedtime. ?Use relaxation techniques to manage stress. Ask your health care provider to suggest some techniques that may work well for you. These may include: ?Breathing exercises. ?Routines to release muscle tension. ?Visualizing peaceful scenes. ?Make sure that you drive carefully. Do not drive if you feel very sleepy. ?Keep all follow-up visits. This is important. ?Contact a health care provider if: ?You are tired throughout the day. ?You have trouble in your daily routine due to sleepiness. ?You continue to have sleep problems, or your sleep problems get worse. ?Get help right away if: ?You have thoughts about hurting yourself or someone else. ?Get help right away if you feel like you may hurt yourself or others, or have thoughts about taking your own life. Go to your nearest emergency room or: ?Call 911. ?Call the [facility] at [phone] or 988. This is open 24 hours a day. ?Text the Crisis Text Line at 938 760 3470. ?Summary ?Insomnia is a sleep disorder that makes it difficult to fall asleep or stay asleep. ?Insomnia can be long-term (chronic) or short-term (acute). ?Treatment for insomnia depends on the cause. Treatment may focus on treating an underlying condition that is causing the insomnia. ?Keep a sleep diary to help you and your health care provider figure out what could be causing your insomnia. ?This information is not intended to replace advice given to you by your health care provider. Make sure you discuss any questions you have with your health care provider. ?Document Revised: 07/28/2021 Document Reviewed: 07/28/2021 ?Elsevier Patient Education ? [place]. ?Tinnitus ?Tinnitus refers to hearing a sound when there is no  actual source for that sound. This is often described as ringing in the ears. However, people with this condition may hear a variety of noises, in one ear or in both ears. ?The sounds of tinnitus can be soft, loud, or somewhere in between. Tinnitus can last for a few seconds or can be constant for days. It may go away without treatment and come back at various times. When tinnitus is constant or happens often, it can lead to other problems, such as trouble sleeping and trouble concentrating. ?Almost everyone experiences tinnitus at some point. Tinnitus is not the same as hearing loss. Tinnitus that is long-lasting (chronic) or comes back often (recurs) may require medical attention. ?What are the causes? ?The cause of tinnitus is often not known. In some cases, it can result from: ?Exposure to loud noises from machinery, music, or other sources. ?An object (foreign body) stuck in the ear. ?Earwax buildup. ?Drinking alcohol or caffeine. ?Taking certain medicines. ?Age-related hearing loss. ?It may also be caused by medical conditions such as: ?Ear or sinus infections. ?Heart diseases or high blood pressure. ?Allergies. ?M?ni?re's disease. ?Thyroid problems. ?Tumors. ?A weak, bulging blood vessel (aneurysm) near the ear. ?What increases the risk? ?The following factors may make you more likely to develop this condition: ?Exposure to loud noises. ?Age. Tinnitus is more likely in older individuals. ?Using alcohol or tobacco. ?What are the signs or symptoms? ?The main symptom of tinnitus is hearing a sound when there is no source for that sound. It may sound like: ?Buzzing. ?Sizzling. ?Ringing. ?  Blowing air. ?Hissing. ?Whistling. ?Other sounds may include: ?Roaring. ?Running water. ?A musical note. ?Tapping. ?Humming. ?Symptoms may affect only one ear (unilateral) or both ears (bilateral). ?How is this diagnosed? ?Tinnitus is diagnosed based on your symptoms, your medical history, and a physical exam. Your health care  provider may do a thorough hearing test (audiologic exam) if your tinnitus: ?Is unilateral. ?Causes hearing difficulties. ?Lasts 6 months or longer. ?You may work with a health care provider who specializes in

## 2021-12-23 NOTE — Progress Notes (Signed)
? ? ?SLEEP MEDICINE CLINIC ?  ? ?Provider:  Larey Seat, MD  ?Primary Care Physician:  [name], MD ?[address] ?[place] [address]  ? ?  ?Referring Provider: [name], Md ?[address] ?[place],  [place] [address]  ?  ?  ?    ?Chief Complaint according to patient   ?Patient presents with:  ?  ? New Patient (Initial Visit)  ?     ?  ?  ?HISTORY OF PRESENT ILLNESS:  ?Aaron [name] is a 71 y.o. year old  male patient of [place] descent, and he is seen here upon referral on 12/23/2021 from Dr. Mitchel Honour  for a sleep evaluation. He sees Dr Jaynee Eagles for headaches .  ?.  ?Chief concern according to patient :  I have used an oximeter at home , that device was recommended by my wife's eye doctor, Dr Zadie Rhine, who evaluated her for glaucoma and thought to check for a OSA overlap. Sh tested positive and I used the device later on, now I am concerned about the data collected. " ?"I am easily fatigued, tired and feel not refreshed after 9 hours of sleep-. "I lost 30 pounds in 2022- and still snore.  ?  ?I have the pleasure of seeing LANGSTON TUB[place]LE on 12-23-2021, , a right -handed White or Caucasian male with a possible sleep disorder.  He has a past medical history of Diabetes mellitus without complication ([place]), GERD (gastroesophageal reflux disease), constant tension Headache and neck ache, hands with Reynaud's , Psoriatics, Hyperlipidemia, Hypertension, and Psoriasis.Marland Kitchen ?Headaches are non migrainous, these don't wake him and they are sometimes present in the moring when he wakes up. He has tinnitus high pitched in both ears.  ?  ?Sleep relevant medical history: Nocturia 2 times, Tonsillectomy in childhood,DDD cervical spine, tension, ear : frequent otitis- no drum injuries.  ?  ? Family medical /sleep history: nephew through sister - other family member on CPAP with OSA. He is a Immunologist.  ?  ?Social history: Patient is retired from a Field seismologist. and lives in a household  with  persons/ alone. Family status is married no pets.  ? ?Tobacco use; none .  ETOH use ; 2-3 a month ,  ?Caffeine intake in form of Coffee( 2 cups in AM ) . ?Regular exercise in form of walking.   ?Hobbies :photography.  ? ? ?Sleep habits are as follows: The patient's dinner time is between 7-8 PM. The patient goes to bed at  midnight  and struggles to go to sleep - tinnitus keeps him up- continues to sleep for 5-7 hours, wakes for 1-2 bathroom breaks, the first time at 2 AM.   ?The preferred sleep position is right side, with the support of 1 memory foam pillow. ?Dreams are reportedly frequent.  ?9 AM is the usual rise time. The patient wakes up spontaneously at 8-9.  ?He reports not feeling refreshed or restored in AM, with symptoms such as dry mouth, morning headaches, and residual fatigue. Naps are taken infrequently, lasting from 30 to 40 minutes and are refreshing sleep.  ?  ?Review of Systems: ?Out of a complete 14 system review, the patient complains of only the following symptoms, and all other reviewed systems are negative.:  ?Fatigue, sleepiness , snoring, fragmented sleep, Insomnia = due to tinnitus.  ?Tinnitus onset in 2014-17, after BPV.  ? ?Hearing loss. Dr Erik Obey [id number].  ? ?  ?How likely are you to doze in  the following situations: ?0 = not likely, 1 = slight chance, 2 = moderate chance, 3 = high chance ?  ?Sitting and Reading? ?Watching Television? ?Sitting inactive in a public place (theater or meeting)? ?As a passenger in a car for an hour without a break? ?Lying down in the afternoon when circumstances permit? ?Sitting and talking to someone? ?Sitting quietly after lunch without alcohol? ?In a car, while stopped for a few minutes in traffic? ?  ?Total = 4/ 24 points  ? FSS endorsed at 56/ 63 points.  ? ?Social History  ? ?Socioeconomic History  ? Marital status: Married  ?  Spouse name: Almyra Free  ? Number of children: 0  ? Years of education: Not on file  ? Highest education level: High school  graduate  ?Occupational History  ? Occupation: Press photographer, retired   ?Tobacco Use  ? Smoking status: Former  ?  Types: Cigarettes  ?  Quit date: 19  ?  Years since quitting: 38.3  ? Smokeless tobacco: Never  ?Vaping Use  ? Vaping Use: Never used  ?Substance and Sexual Activity  ? Alcohol use: Yes  ?  Alcohol/week: 7.0 standard drinks  ?  Types: 7 Cans of beer per week  ?  Comment: 1 beer with dinner usually daily  ? Drug use: No  ? Sexual activity: Yes  ?Other Topics Concern  ? Not on file  ?Social History Narrative  ? Marital status:  Married.   ?   ?    Children:   none  ?    Lives:  ?    Employment:  sales  ?    Tobacco:  none  ?    Alcohol:  About 7 drinks per week  ?    Drugs:   none  ?    Exercise:  ?    Seatbelt:   ?   Education: [employer].  ?   ? Lives at home with wife  ? Right handed  ? Caffeine: 2 cups/day  ? ?Social Determinants of Health  ? ?Financial Resource Strain: Low Risk   ? Difficulty of Paying Living Expenses: Not hard at all  ?Food Insecurity: No Food Insecurity  ? Worried About Charity fundraiser in the Last Year: Never true  ? Ran Out of Food in the Last Year: Never true  ?Transportation Needs: No Transportation Needs  ? Lack of Transportation (Medical): No  ? Lack of Transportation (Non-Medical): No  ?Physical Activity: Sufficiently Active  ? Days of Exercise per Week: 5 days  ? Minutes of Exercise per Session: 30 min  ?Stress: No Stress Concern Present  ? Feeling of Stress : Not at all  ?Social Connections: Socially Integrated  ? Frequency of Communication with Friends and Family: More than three times a week  ? Frequency of Social Gatherings with Friends and Family: More than three times a week  ? Attends Religious Services: 1 to 4 times per year  ? Active Member of Clubs or Organizations: Yes  ? Attends Archivist Meetings: 1 to 4 times per year  ? Marital Status: Married  ? ? ?Family History  ?Problem Relation Age of Onset  ? Cancer Mother   ?     breast ca spread to bone  ?  Cancer Father 34  ?     brain cancer  ? Migraines Neg Hx   ? Headache Neg Hx   ? ? ?Past Medical History:  ?Diagnosis Date  ? Diabetes mellitus without  complication ([place])   ? GERD (gastroesophageal reflux disease)   ? Hyperlipidemia   ? Hypertension   ? Psoriasis   ? ? ?History reviewed. No pertinent surgical history.  ? ?Current Outpatient Medications on File Prior to Visit  ?Medication Sig Dispense Refill  ? metFORMIN (GLUCOPHAGE-XR) 500 MG 24 hr tablet TAKE 1 TABLET BY MOUTH EVERY DAY AT 8PM 180 tablet 0  ? Multiple Vitamin (MULTI-VITAMIN PO) Take by mouth daily.    ? triamcinolone (KENALOG) 0.1 % Apply 1 application topically 2 (two) times daily. 30 g 3  ? vitamin B-12 (CYANOCOBALAMIN) 500 MCG tablet Take 500 mcg by mouth daily.    ? ?No current facility-administered medications on file prior to visit.  ? ? ?Allergies  ?Allergen Reactions  ? Erythromycin Itching  ? Levaquin [Levofloxacin In D5w]   ?  Light sensitivity ?  ? ? ?Physical exam: ? ?Today's Vitals  ? 12/23/21 1437  ?BP: (!) 148/78  ?Pulse: 82  ?Weight: 176 lb 8 oz (80.1 kg)  ?Height: '5\' 6"'$  (1.676 m)  ? ?Body mass index is 28.49 kg/m?.  ? ?Wt Readings from Last 3 Encounters:  ?12/23/21 176 lb 8 oz (80.1 kg)  ?12/10/21 177 lb (80.3 kg)  ?12/02/21 178 lb 2 oz (80.8 kg)  ?  ? ?Ht Readings from Last 3 Encounters:  ?12/23/21 '5\' 6"'$  (1.676 m)  ?12/10/21 '5\' 6"'$  (1.676 m)  ?12/02/21 '5\' 6"'$  (1.676 m)  ?  ?  ?General: The patient is awake, alert and appears not in acute distress. The patient is well groomed. ?Head: Normocephalic, atraumatic. Neck is supple.  ?Mallampati 3,  ?neck circumference:17 inches .  ?Nasal airflow patent.  Retrognathia is not seen.  ?Dental status: biological  ?Cardiovascular:  Regular rate and cardiac rhythm by pulse,  without distended neck veins. ?Respiratory: Lungs are clear to auscultation.  ?Skin:  Without evidence of ankle edema, or rash. ?Trunk: The patient's posture is erect. ?  ?Neurologic exam : ?The patient is awake and alert,  oriented to place and time.   ?Memory subjective described as intact.  ?Attention span & concentration ability appears normal.  ?Speech is fluent,  without  dysarthria, dysphonia or aphasia.  ?Mood and affec

## 2022-01-01 ENCOUNTER — Ambulatory Visit: Payer: Medicare Other | Admitting: Neurology

## 2022-01-01 ENCOUNTER — Ambulatory Visit (INDEPENDENT_AMBULATORY_CARE_PROVIDER_SITE_OTHER): Payer: Medicare Other | Admitting: Neurology

## 2022-01-01 DIAGNOSIS — H9313 Tinnitus, bilateral: Secondary | ICD-10-CM

## 2022-01-01 DIAGNOSIS — G478 Other sleep disorders: Secondary | ICD-10-CM

## 2022-01-01 DIAGNOSIS — R519 Headache, unspecified: Secondary | ICD-10-CM

## 2022-01-01 DIAGNOSIS — G4763 Sleep related bruxism: Secondary | ICD-10-CM

## 2022-01-06 ENCOUNTER — Telehealth: Payer: Medicare Other | Admitting: Family Medicine

## 2022-01-08 ENCOUNTER — Encounter: Payer: Self-pay | Admitting: Neurology

## 2022-01-08 NOTE — Procedures (Signed)
PATIENT'S NAME:  Aaron [name], Aaron [name] ?DOB:      18-Mar-1951      ?MR#:    [id number]     ?DATE OF RECORDING: 01/01/2022 ?REFERRING M.D.:  [facility], MD ?Study Performed:   Baseline Polysomnogram ?HISTORY:  Aaron [name] is a 71 year-old right -handed Caucasian male patient with a possible sleep disorder.  ?He has a medical history of: Diabetes mellitus ([place]), GERD (gastroesophageal reflux disease), constant tension Headache and neck ache, hands affected by Reynaud's, Psoriasis, Hyperlipidemia, Hypertension and former tobacco use. ?Headaches are no- migrainous, these don't wake him but are sometimes present in the morning. He has tinnitus, high pitched in both ears. Hearing loss. ?The patient endorsed the Epworth Sleepiness Scale at 4 points.  FSS at 56-63 points. ?The patient's weight 176 pounds with a height of 66 (inches), resulting in a BMI of 28.3 kg/m2. ?The patient's neck circumference measured 17 inches. ? ?CURRENT MEDICATIONS: Glucophage, multivitamin, Vitamin B12 ?  ?PROCEDURE:  This is a multichannel digital polysomnogram utilizing the Somnostar 11.2 system.  Electrodes and sensors were applied and monitored per AASM Specifications.   EEG, EOG, Chin and Limb EMG, were sampled at 200 Hz.  ECG, Snore and Nasal Pressure, Thermal Airflow, Respiratory Effort, CPAP Flow and Pressure, Oximetry was sampled at 50 Hz. Digital video and audio were recorded.     ? ?BASELINE STUDY: Lights Out was at 21:50 and Lights On at 05:13.  Total recording time (TRT) was 443 minutes, with a total sleep time (TST) of 239 minutes.   The patient's sleep latency was 125 minutes.  REM latency was 172.5 minutes.  The sleep efficiency was 54. %.  ?   ?SLEEP ARCHITECTURE: WASO (Wake after sleep onset) was 165 minutes.  There were 18.5 minutes in Stage N1, 190 minutes Stage N2, 0 minutes Stage N3 and 30.5 minutes in Stage REM.  The percentage of Stage N1 was 7.7%, Stage N2 was 79.5%, Stage N3 was 0% and Stage R (REM sleep) was  12.8%.  ? ?RESPIRATORY ANALYSIS:  There were a total of 43 respiratory events:  29 obstructive apneas, 1 central apnea and 1 mixed apnea with a total of 31 apneas and an apnea index (AI) of 7.8 /hour. There were 12 hypopneas with a hypopnea index of 3. /hour.  ?    ?The total APNEA/HYPOPNEA INDEX (AHI) was 10.8/hour and the total RESPIRATORY DISTURBANCE INDEX was 12.8 /hour.  14 events occurred in REM sleep and 22 events in NREM. The REM AHI was 27.5 /hour, versus a non-REM AHI of 8.3. The patient spent 85.5 minutes of total sleep time in the supine position and 154 minutes in non-supine.. The supine AHI was 11.9 versus a non-supine AHI of 10.2. ? ?OXYGEN SATURATION & C02:  The Wake baseline 02 saturation was 97%, with the lowest being 83%. Time spent below 89% saturation equaled 6 minutes. ?  ?PERIODIC LIMB MOVEMENTS:  The patient had a total of 71 Periodic Limb Movements.  The Periodic Limb Movement (PLM) Arousal index was 1.3/hour. ?The arousals were noted as: 47 were spontaneous, 5 were associated with PLMs, 26 were associated with respiratory events.  ?Every REM sleep period ended in brief wakefulness, not lighter sleep.  ? ?Audio and video analysis did not show any abnormal or unusual movements, behaviors, phonations or vocalizations. Snoring was present.  ?EKG was in keeping with normal sinus rhythm (NSR). ? ? ?IMPRESSION: ? ?Very mild Obstructive Sleep Apnea (OSA) ?Mild Periodic Limb Movement Disorder (PLMD),  clustered between 1.30 and 3 AM. ?Snoring ?Normal EKG ? ? ?RECOMMENDATIONS: ? ?Advise full-night, attended, CPAP titration study to optimize therapy. Plan B is autotitration CPAP 5-15 cm water, 2 cm EPR and heated humidification with mask of choice.    ? ? ?I certify that I have reviewed the entire raw data recording prior to the issuance of this report in accordance with the Standards of Accreditation of the [place] [employer] (AASM) ? ? ? ? ? ?Larey Seat, MD ?[place], [place]  Board of Psychiatry and Neurology  ?Diplomat, Tax adviser of Sleep Medicine ?Medical Director, [facility] at [facility] ? ? ?

## 2022-01-08 NOTE — Addendum Note (Signed)
Addended by: Larey Seat on: 01/08/2022 08:52 AM ? ? Modules accepted: Orders ? ?

## 2022-01-13 ENCOUNTER — Ambulatory Visit (INDEPENDENT_AMBULATORY_CARE_PROVIDER_SITE_OTHER): Payer: Medicare Other | Admitting: Emergency Medicine

## 2022-01-13 VITALS — BP 130/70 | HR 79 | Temp 98.0°F | Ht 66.0 in | Wt 179.0 lb

## 2022-01-13 DIAGNOSIS — R109 Unspecified abdominal pain: Secondary | ICD-10-CM

## 2022-01-13 NOTE — Progress Notes (Signed)
Aaron [name] ?71 y.o. ? ? ?Chief Complaint  ?Patient presents with  ? Flank Pain  ?  Right flank pain, not getting better  ? ? ?HISTORY OF PRESENT ILLNESS: ?This is a 71 y.o. male complaining of persistent right flank pain for the past 4 to 5 months. ?No associated symptomatology. ? ?HPI ? ? ?Prior to Admission medications   ?Medication Sig Start Date End Date Taking? Authorizing Provider  ?metFORMIN (GLUCOPHAGE-XR) 500 MG 24 hr tablet TAKE 1 TABLET BY MOUTH EVERY DAY AT 8PM 09/24/21  Yes Ajwa Kimberley, Ines Bloomer, MD  ?Multiple Vitamin (MULTI-VITAMIN PO) Take by mouth daily.   Yes [provider]  ?triamcinolone (KENALOG) 0.1 % Apply 1 application topically 2 (two) times daily. 11/12/20  Yes Cinzia Devos, Ines Bloomer, MD  ?vitamin B-12 (CYANOCOBALAMIN) 500 MCG tablet Take 500 mcg by mouth daily.   Yes [provider]  ? ? ?Allergies  ?Allergen Reactions  ? Erythromycin Itching  ? Levaquin [Levofloxacin In D5w]   ?  Light sensitivity ?  ? ? ?Patient Active Problem List  ? Diagnosis Date Noted  ? Non-restorative sleep 12/23/2021  ? Sleep related bruxism 12/23/2021  ? Morning headache 12/23/2021  ? Chronic fatigue 12/23/2021  ? Sore of lip 12/02/2021  ? Tinnitus of both ears 12/02/2021  ? Diabetes ([place]) 03/01/2021  ? Chronic anxiety 01/08/2021  ? Raynaud's phenomenon without gangrene 01/08/2021  ? Other psoriasis 10/01/2020  ? Coronary artery calcification 06/12/2020  ? Hypertension associated with diabetes ([place]) 05/29/2019  ? Gastroesophageal reflux disease without esophagitis 03/13/2017  ? Dyslipidemia associated with type 2 diabetes mellitus ([place]) 03/13/2017  ? ? ?Past Medical History:  ?Diagnosis Date  ? Diabetes mellitus without complication ([place])   ? GERD (gastroesophageal reflux disease)   ? Hyperlipidemia   ? Hypertension   ? Psoriasis   ? ? ?No past surgical history on file. ? ?Social History  ? ?Socioeconomic History  ? Marital status: Married  ?  Spouse name: Almyra Free  ? Number of children: 0  ?  Years of education: Not on file  ? Highest education level: High school graduate  ?Occupational History  ? Occupation: Press photographer, retired   ?Tobacco Use  ? Smoking status: Former  ?  Types: Cigarettes  ?  Quit date: 57  ?  Years since quitting: 38.3  ? Smokeless tobacco: Never  ?Vaping Use  ? Vaping Use: Never used  ?Substance and Sexual Activity  ? Alcohol use: Yes  ?  Alcohol/week: 7.0 standard drinks  ?  Types: 7 Cans of beer per week  ?  Comment: 1 beer with dinner usually daily  ? Drug use: No  ? Sexual activity: Yes  ?Other Topics Concern  ? Not on file  ?Social History Narrative  ? Marital status:  Married.   ?   ?    Children:   none  ?    Lives:  ?    Employment:  sales  ?    Tobacco:  none  ?    Alcohol:  About 7 drinks per week  ?    Drugs:   none  ?    Exercise:  ?    Seatbelt:   ?   Education: [employer].  ?   ? Lives at home with wife  ? Right handed  ? Caffeine: 2 cups/day  ? ?Social Determinants of Health  ? ?Financial Resource Strain: Low Risk   ? Difficulty of Paying Living Expenses: Not hard at all  ?Food  Insecurity: No Food Insecurity  ? Worried About Charity fundraiser in the Last Year: Never true  ? Ran Out of Food in the Last Year: Never true  ?Transportation Needs: No Transportation Needs  ? Lack of Transportation (Medical): No  ? Lack of Transportation (Non-Medical): No  ?Physical Activity: Sufficiently Active  ? Days of Exercise per Week: 5 days  ? Minutes of Exercise per Session: 30 min  ?Stress: No Stress Concern Present  ? Feeling of Stress : Not at all  ?Social Connections: Socially Integrated  ? Frequency of Communication with Friends and Family: More than three times a week  ? Frequency of Social Gatherings with Friends and Family: More than three times a week  ? Attends Religious Services: 1 to 4 times per year  ? Active Member of Clubs or Organizations: Yes  ? Attends Archivist Meetings: 1 to 4 times per year  ? Marital Status: Married  ?Intimate Partner Violence: Not At  Risk  ? Fear of Current or Ex-Partner: No  ? Emotionally Abused: No  ? Physically Abused: No  ? Sexually Abused: No  ? ? ?Family History  ?Problem Relation Age of Onset  ? Cancer Mother   ?     breast ca spread to bone  ? Cancer Father 85  ?     brain cancer  ? Migraines Neg Hx   ? Headache Neg Hx   ? ? ? ?Review of Systems  ?Constitutional: Negative.  Negative for chills and fever.  ?HENT:  Positive for tinnitus. Negative for congestion.   ?Respiratory: Negative.  Negative for cough and shortness of breath.   ?Cardiovascular: Negative.  Negative for chest pain and palpitations.  ?Gastrointestinal:  Negative for abdominal pain, blood in stool, diarrhea, melena, nausea and vomiting.  ?Genitourinary: Negative.  Negative for dysuria, frequency and hematuria.  ?Musculoskeletal:  Positive for back pain.  ?Skin: Negative.  Negative for rash.  ?Neurological:  Negative for dizziness and headaches.  ?All other systems reviewed and are negative. ? ?Today's Vitals  ? 01/13/22 1355 01/13/22 1359  ?BP: (!) 150/84 (!) 146/80  ?Pulse: 79   ?Temp: 98 ?F (36.7 ?C)   ?TempSrc: Oral   ?SpO2: 96%   ?Weight: 179 lb (81.2 kg)   ?Height: '5\' 6"'$  (1.676 m)   ? ?Body mass index is 28.89 kg/m?. ? ?Physical Exam ?Vitals reviewed.  ?Constitutional:   ?   Appearance: Normal appearance.  ?HENT:  ?   Head: Normocephalic.  ?Eyes:  ?   Extraocular Movements: Extraocular movements intact.  ?   Pupils: Pupils are equal, round, and reactive to light.  ?Cardiovascular:  ?   Rate and Rhythm: Normal rate and regular rhythm.  ?   Pulses: Normal pulses.  ?   Heart sounds: Normal heart sounds.  ?Pulmonary:  ?   Effort: Pulmonary effort is normal.  ?   Breath sounds: Normal breath sounds.  ?Abdominal:  ?   Palpations: Abdomen is soft.  ?   Tenderness: There is no abdominal tenderness. There is no right CVA tenderness or left CVA tenderness.  ?Musculoskeletal:     ?   General: Normal range of motion.  ?   Cervical back: No tenderness.  ?Lymphadenopathy:  ?    Cervical: No cervical adenopathy.  ?Skin: ?   General: Skin is warm and dry.  ?   Capillary Refill: Capillary refill takes less than 2 seconds.  ?Neurological:  ?   General: No focal deficit present.  ?  Mental Status: He is alert and oriented to person, place, and time.  ?Psychiatric:     ?   Mood and Affect: Mood normal.     ?   Behavior: Behavior normal.  ? ? ? ?ASSESSMENT & PLAN: ?A total of 33 minutes was spent with the patient and counseling/coordination of care regarding preparing for this visit, review of most recent office visit notes, review of most recent diagnostic imaging reports, review of most recent blood work results, differential diagnosis of flank pain, prognosis, documentation and need for follow-up. ? ?Problem List Items Addressed This Visit   ? ?  ? Other  ? Right flank pain - Primary  ?  Musculoskeletal in nature. ?Recent kidney ultrasound report reviewed with patient. ?Recent blood work results reviewed with patient. ?We will order CT scan of abdomen and pelvis for further diagnostic work-up. ?Advised to take Tylenol as needed for pain. ?Follow-up with me after scan is done. ? ?  ?  ? Relevant Orders  ? CT Abdomen Pelvis Wo Contrast  ? ?Patient Instructions  ?Flank Pain, Adult ?Flank pain is pain in your side. The flank is the area on your side between your upper belly (abdomen) and your spine. The pain may occur over a short time (acute), or it may be long-term or come back often (chronic). It may be mild or very bad. Pain in this area can be caused by many different things. ?Follow these instructions at home: ? ?Drink enough fluid to keep your pee (urine) pale yellow. ?Rest as told by your doctor. ?Take over-the-counter and prescription medicines only as told by your doctor. ?Keep a journal to keep track of: ?What has caused your flank pain. ?What has made your flank pain feel better. ?Keep all follow-up visits. ?Contact a doctor if: ?Medicine does not help your pain. ?You have new  symptoms. ?Your pain gets worse. ?Your symptoms last longer than 2-3 days. ?You have trouble peeing. ?You are peeing more often than normal. ?Get help right away if: ?You have trouble breathing. ?You are short o

## 2022-01-13 NOTE — Patient Instructions (Signed)
Flank Pain, Adult ?Flank pain is pain in your side. The flank is the area on your side between your upper belly (abdomen) and your spine. The pain may occur over a short time (acute), or it may be long-term or come back often (chronic). It may be mild or very bad. Pain in this area can be caused by many different things. ?Follow these instructions at home: ? ?Drink enough fluid to keep your pee (urine) pale yellow. ?Rest as told by your doctor. ?Take over-the-counter and prescription medicines only as told by your doctor. ?Keep a journal to keep track of: ?What has caused your flank pain. ?What has made your flank pain feel better. ?Keep all follow-up visits. ?Contact a doctor if: ?Medicine does not help your pain. ?You have new symptoms. ?Your pain gets worse. ?Your symptoms last longer than 2-3 days. ?You have trouble peeing. ?You are peeing more often than normal. ?Get help right away if: ?You have trouble breathing. ?You are short of breath. ?Your belly hurts, or it is swollen or red. ?You feel like you may vomit (nauseous). ?You vomit. ?You feel faint, or you faint. ?You have blood in your pee. ?You have flank pain and a fever. ?These symptoms may be an emergency. Get help right away. Call your local emergency services (911 in the [place] ?Do not wait to see if the symptoms will go away. ?Do not drive yourself to the hospital. ?Summary ?Flank pain is pain in your side. The flank is the area of your side between your upper belly (abdomen) and your spine. ?Flank pain may occur over a short time (acute), or it may be long-term or come back often (chronic). It may be mild or very bad. ?Pain in this area can be caused by many different things. ?Contact your doctor if your symptoms get worse or last longer than 2-3 days. ?This information is not intended to replace advice given to you by your health care provider. Make sure you discuss any questions you have with your health care provider. ?Document Revised:  10/28/2020 Document Reviewed: 10/28/2020 ?Elsevier Patient Education ? 2023 [employer]vier Inc[employer] ? ?

## 2022-01-14 ENCOUNTER — Encounter: Payer: Self-pay | Admitting: Emergency Medicine

## 2022-01-14 NOTE — Assessment & Plan Note (Signed)
Musculoskeletal in nature. ?Recent kidney ultrasound report reviewed with patient. ?Recent blood work results reviewed with patient. ?We will order CT scan of abdomen and pelvis for further diagnostic work-up. ?Advised to take Tylenol as needed for pain. ?Follow-up with me after scan is done. ?

## 2022-01-22 ENCOUNTER — Encounter: Payer: Self-pay | Admitting: Emergency Medicine

## 2022-01-25 ENCOUNTER — Ambulatory Visit (INDEPENDENT_AMBULATORY_CARE_PROVIDER_SITE_OTHER): Payer: Medicare Other | Admitting: Neurology

## 2022-01-25 DIAGNOSIS — G4733 Obstructive sleep apnea (adult) (pediatric): Secondary | ICD-10-CM

## 2022-01-25 DIAGNOSIS — H9313 Tinnitus, bilateral: Secondary | ICD-10-CM

## 2022-01-25 DIAGNOSIS — G4763 Sleep related bruxism: Secondary | ICD-10-CM

## 2022-01-25 DIAGNOSIS — R519 Headache, unspecified: Secondary | ICD-10-CM

## 2022-01-25 DIAGNOSIS — G478 Other sleep disorders: Secondary | ICD-10-CM

## 2022-02-05 ENCOUNTER — Ambulatory Visit
Admission: RE | Admit: 2022-02-05 | Discharge: 2022-02-05 | Disposition: A | Discharge status: Home / Self Care | Payer: Medicare Other | Source: Ambulatory Visit | Attending: Emergency Medicine | Admitting: Emergency Medicine

## 2022-02-05 DIAGNOSIS — R222 Localized swelling, mass and lump, trunk: Secondary | ICD-10-CM | POA: Diagnosis not present

## 2022-02-05 DIAGNOSIS — R109 Unspecified abdominal pain: Secondary | ICD-10-CM

## 2022-02-05 DIAGNOSIS — I700 Atherosclerosis of aorta: Secondary | ICD-10-CM | POA: Diagnosis not present

## 2022-02-05 DIAGNOSIS — K429 Umbilical hernia without obstruction or gangrene: Secondary | ICD-10-CM | POA: Diagnosis not present

## 2022-02-05 DIAGNOSIS — K5790 Diverticulosis of intestine, part unspecified, without perforation or abscess without bleeding: Secondary | ICD-10-CM | POA: Diagnosis not present

## 2022-02-08 ENCOUNTER — Other Ambulatory Visit: Payer: Self-pay | Admitting: Emergency Medicine

## 2022-02-09 ENCOUNTER — Encounter: Payer: Self-pay | Admitting: Emergency Medicine

## 2022-02-09 ENCOUNTER — Ambulatory Visit (INDEPENDENT_AMBULATORY_CARE_PROVIDER_SITE_OTHER): Payer: Medicare Other | Admitting: Emergency Medicine

## 2022-02-09 VITALS — BP 130/78 | HR 75 | Temp 98.2°F | Ht 66.0 in | Wt 179.0 lb

## 2022-02-09 DIAGNOSIS — M79602 Pain in left arm: Secondary | ICD-10-CM | POA: Diagnosis not present

## 2022-02-09 DIAGNOSIS — K5790 Diverticulosis of intestine, part unspecified, without perforation or abscess without bleeding: Secondary | ICD-10-CM | POA: Diagnosis not present

## 2022-02-09 DIAGNOSIS — R109 Unspecified abdominal pain: Secondary | ICD-10-CM

## 2022-02-09 DIAGNOSIS — R29818 Other symptoms and signs involving the nervous system: Secondary | ICD-10-CM | POA: Diagnosis not present

## 2022-02-09 DIAGNOSIS — I700 Atherosclerosis of aorta: Secondary | ICD-10-CM

## 2022-02-09 MED ORDER — ROSUVASTATIN CALCIUM 10 MG PO TABS: 10.0000 mg | ORAL_TABLET | Freq: Every day | ORAL | 3 refills | Status: DC

## 2022-02-09 NOTE — Patient Instructions (Signed)
Health Maintenance After Age 69 After age 69, you are at a higher risk for certain long-term diseases and infections as well as injuries from falls. Falls are a major cause of broken bones and head injuries in people who are older than age 69. Getting regular preventive care can help to keep you healthy and well. Preventive care includes getting regular testing and making lifestyle changes as recommended by your health care provider. Talk with your health care provider about: Which screenings and tests you should have. A screening is a test that checks for a disease when you have no symptoms. A diet and exercise plan that is right for you. What should I know about screenings and tests to prevent falls? Screening and testing are the best ways to find a health problem early. Early diagnosis and treatment give you the best chance of managing medical conditions that are common after age 69. Certain conditions and lifestyle choices may make you more likely to have a fall. Your health care provider may recommend: Regular vision checks. Poor vision and conditions such as cataracts can make you more likely to have a fall. If you wear glasses, make sure to get your prescription updated if your vision changes. Medicine review. Work with your health care provider to regularly review all of the medicines you are taking, including over-the-counter medicines. Ask your health care provider about any side effects that may make you more likely to have a fall. Tell your health care provider if any medicines that you take make you feel dizzy or sleepy. Strength and balance checks. Your health care provider may recommend certain tests to check your strength and balance while standing, walking, or changing positions. Foot health exam. Foot pain and numbness, as well as not wearing proper footwear, can make you more likely to have a fall. Screenings, including: Osteoporosis screening. Osteoporosis is a condition that causes  the bones to get weaker and break more easily. Blood pressure screening. Blood pressure changes and medicines to control blood pressure can make you feel dizzy. Depression screening. You may be more likely to have a fall if you have a fear of falling, feel depressed, or feel unable to do activities that you used to do. Alcohol use screening. Using too much alcohol can affect your balance and may make you more likely to have a fall. Follow these instructions at home: Lifestyle Do not drink alcohol if: Your health care provider tells you not to drink. If you drink alcohol: Limit how much you have to: 0-1 drink a day for women. 0-2 drinks a day for men. Know how much alcohol is in your drink. In the [place] one drink equals one 12 oz bottle of beer (355 mL), one 5 oz glass of wine (148 mL), or one 1 oz glass of hard liquor (44 mL). Do not use any products that contain nicotine or tobacco. These products include cigarettes, chewing tobacco, and vaping devices, such as e-cigarettes. If you need help quitting, ask your health care provider. Activity  Follow a regular exercise program to stay fit. This will help you maintain your balance. Ask your health care provider what types of exercise are appropriate for you. If you need a cane or walker, use it as recommended by your health care provider. Wear supportive shoes that have nonskid soles. Safety  Remove any tripping hazards, such as rugs, cords, and clutter. Install safety equipment such as grab bars in bathrooms and safety rails on stairs. Keep rooms and walkways   well-lit. General instructions Talk with your health care provider about your risks for falling. Tell your health care provider if: You fall. Be sure to tell your health care provider about all falls, even ones that seem minor. You feel dizzy, tiredness (fatigue), or off-balance. Take over-the-counter and prescription medicines only as told by your health care provider. These include  supplements. Eat a healthy diet and maintain a healthy weight. A healthy diet includes low-fat dairy products, low-fat (lean) meats, and fiber from whole grains, beans, and lots of fruits and vegetables. Stay current with your vaccines. Schedule regular health, dental, and eye exams. Summary Having a healthy lifestyle and getting preventive care can help to protect your health and wellness after age 69. Screening and testing are the best way to find a health problem early and help you avoid having a fall. Early diagnosis and treatment give you the best chance for managing medical conditions that are more common for people who are older than age 69. Falls are a major cause of broken bones and head injuries in people who are older than age 69. Take precautions to prevent a fall at home. Work with your health care provider to learn what changes you can make to improve your health and wellness and to prevent falls. This information is not intended to replace advice given to you by your health care provider. Make sure you discuss any questions you have with your health care provider. Document Revised: 01/06/2021 Document Reviewed: 01/06/2021 Elsevier Patient Education  2023 Elsevier Inc.  

## 2022-02-09 NOTE — Assessment & Plan Note (Signed)
Recommend to start rosuvastatin 10 mg daily. The 10-year ASCVD risk score (Arnett DK, et al., 2019) is: 34.7%   Values used to calculate the score:     Age: 71 years     Sex: Male     Is Non-Hispanic African American: No     Diabetic: Yes     Tobacco smoker: Yes     Systolic Blood Pressure: 159 mmHg     Is BP treated: No     HDL Cholesterol: 43.8 mg/dL     Total Cholesterol: 153 mg/dL

## 2022-02-09 NOTE — Assessment & Plan Note (Signed)
Recently diagnosed with sleep apnea.  Need to follow-up with Dr. Brett Fairy.

## 2022-02-09 NOTE — Assessment & Plan Note (Signed)
No evidence of diverticulitis.  Diet and nutrition discussed.  Advised to increase amount of daily fiber intake.

## 2022-02-09 NOTE — Assessment & Plan Note (Signed)
Musculoskeletal.  No acute findings on CT scan of abdomen and pelvis.  Incidental findings discussed with patient.  Advised to take Tylenol and or NSAIDs as needed for pain.

## 2022-02-09 NOTE — Assessment & Plan Note (Signed)
Benign findings.  Advised to take Tylenol and or NSAIDs as needed.

## 2022-02-09 NOTE — Progress Notes (Signed)
Aaron [name] 71 y.o.   Chief Complaint  Patient presents with   Follow-up    F/u after CT scan    Arm Pain    Left arm pain     HISTORY OF PRESENT ILLNESS: This is a 71 y.o. male here for follow-up of left flank pain and to discuss recent CT scanning abdomen and pelvis results. Scan showed no kidney abnormalities. It shows aortic atherosclerosis, diverticulosis without diverticulitis, small periumbilical hernia, and subcutaneous nodule. No acute findings. Patient also complaining of occasional left arm pain when he sleeps on it mostly. Has history of psoriasis on left elbow area.  Was concerned about psoriatic arthritis. No other complaints or medical concerns today.  Arm Pain  Pertinent negatives include no chest pain.     Prior to Admission medications   Medication Sig Start Date End Date Taking? Authorizing Provider  metFORMIN (GLUCOPHAGE-XR) 500 MG 24 hr tablet TAKE 1 TABLET BY MOUTH EVERY DAY AT 8PM 09/24/21  Yes Brissia Delisa, Ines Bloomer, MD  Multiple Vitamin (MULTI-VITAMIN PO) Take by mouth daily.   Yes [provider]  triamcinolone (KENALOG) 0.1 % Apply 1 application topically 2 (two) times daily. 11/12/20  Yes Antanisha Mohs, Ines Bloomer, MD  vitamin B-12 (CYANOCOBALAMIN) 500 MCG tablet Take 500 mcg by mouth daily.   Yes [provider]    Allergies  Allergen Reactions   Erythromycin Itching   Levaquin [Levofloxacin In D5w]     Light sensitivity     Patient Active Problem List   Diagnosis Date Noted   Non-restorative sleep 12/23/2021   Sleep related bruxism 12/23/2021   Morning headache 12/23/2021   Chronic fatigue 12/23/2021   Sore of lip 12/02/2021   Tinnitus of both ears 12/02/2021   Right flank pain 08/15/2021   Diabetes ([place]) 03/01/2021   Chronic anxiety 01/08/2021   Raynaud's phenomenon without gangrene 01/08/2021   Other psoriasis 10/01/2020   Coronary artery calcification 06/12/2020   Hypertension associated with diabetes ([place])  05/29/2019   Gastroesophageal reflux disease without esophagitis 03/13/2017   Dyslipidemia associated with type 2 diabetes mellitus ([place]) 03/13/2017    Past Medical History:  Diagnosis Date   Diabetes mellitus without complication (HCC)    GERD (gastroesophageal reflux disease)    Hyperlipidemia    Hypertension    Psoriasis     No past surgical history on file.  Social History   Socioeconomic History   Marital status: Married    Spouse name: Almyra Free   Number of children: 0   Years of education: Not on file   Highest education level: High school graduate  Occupational History   Occupation: Press photographer, retired   Tobacco Use   Smoking status: Former    Types: Cigarettes    Quit date: 1985    Years since quitting: 38.4   Smokeless tobacco: Never  Vaping Use   Vaping Use: Never used  Substance and Sexual Activity   Alcohol use: Yes    Alcohol/week: 7.0 standard drinks of alcohol    Types: 7 Cans of beer per week    Comment: 1 beer with dinner usually daily   Drug use: No   Sexual activity: Yes  Other Topics Concern   Not on file  Social History Narrative   Marital status:  Married.          Children:   none      Lives:      Employment:  sales      Tobacco:  none  Alcohol:  About 7 drinks per week      Drugs:   none      Exercise:      Seatbelt:      Education: [employer].      Lives at home with wife   Right handed   Caffeine: 2 cups/day   Social Determinants of Health   Financial Resource Strain: Low Risk  (04/03/2021)   Overall Financial Resource Strain (CARDIA)    Difficulty of Paying Living Expenses: Not hard at all  Food Insecurity: No Food Insecurity (04/03/2021)   Hunger Vital Sign    Worried About Running Out of Food in the Last Year: Never true    [place] in the Last Year: Never true  Transportation Needs: No Transportation Needs (04/03/2021)   PRAPARE - Hydrologist (Medical): No    Lack of Transportation  (Non-Medical): No  Physical Activity: Sufficiently Active (04/03/2021)   Exercise Vital Sign    Days of Exercise per Week: 5 days    Minutes of Exercise per Session: 30 min  Stress: No Stress Concern Present (04/03/2021)   [place]    Feeling of Stress : Not at all  Social Connections: Socially Integrated (04/03/2021)   Social Connection and Isolation Panel [NHANES]    Frequency of Communication with Friends and Family: More than three times a week    Frequency of Social Gatherings with Friends and Family: More than three times a week    Attends Religious Services: 1 to 4 times per year    Active Member of [employer] or Organizations: Yes    Attends Archivist Meetings: 1 to 4 times per year    Marital Status: Married  Human resources officer Violence: Not At Risk (04/03/2021)   Humiliation, Afraid, Rape, and Kick questionnaire    Fear of Current or Ex-Partner: No    Emotionally Abused: No    Physically Abused: No    Sexually Abused: No    Family History  Problem Relation Age of Onset   Cancer Mother        breast ca spread to bone   Cancer Father 1       brain cancer   Migraines Neg Hx    Headache Neg Hx      Review of Systems  Constitutional: Negative.  Negative for chills and fever.  HENT: Negative.  Negative for congestion and sore throat.   Respiratory: Negative.  Negative for cough and shortness of breath.   Cardiovascular:  Negative for chest pain and palpitations.  Gastrointestinal:  Negative for abdominal pain, nausea and vomiting.  Genitourinary: Negative.   Musculoskeletal:  Positive for back pain.  Skin: Negative.  Negative for rash.  Neurological:  Negative for dizziness and headaches.  All other systems reviewed and are negative.  Today's Vitals   02/09/22 1312  BP: 130/78  Pulse: 75  Temp: 98.2 F (36.8 C)  TempSrc: Oral  SpO2: 95%  Weight: 179 lb (81.2 kg)  Height: '5\' 6"'$  (1.676 m)    Body mass index is 28.89 kg/m.   Physical Exam Vitals reviewed.  Constitutional:      Appearance: Normal appearance.  HENT:     Head: Normocephalic.  Eyes:     Extraocular Movements: Extraocular movements intact.     Pupils: Pupils are equal, round, and reactive to light.  Cardiovascular:     Rate and Rhythm: Normal rate.  Pulmonary:  Effort: Pulmonary effort is normal.  Abdominal:     Palpations: Abdomen is soft.     Tenderness: There is no abdominal tenderness.  Musculoskeletal:     Cervical back: No tenderness.     Comments: Left arm: Full range of motion.  No localized tenderness.  Lymphadenopathy:     Cervical: No cervical adenopathy.  Skin:    General: Skin is warm and dry.     Capillary Refill: Capillary refill takes less than 2 seconds.     Findings: Lesion (Sebaceous cyst to right upper abdominal area) and rash (Psoriasis rash left elbow area) present.  Neurological:     General: No focal deficit present.     Mental Status: He is alert and oriented to person, place, and time.  Psychiatric:        Mood and Affect: Mood normal.        Behavior: Behavior normal.      ASSESSMENT & PLAN: Problem List Items Addressed This Visit       Cardiovascular and Mediastinum   Aortic atherosclerosis ([place])    Recommend to start rosuvastatin 10 mg daily. The 10-year ASCVD risk score (Arnett DK, et al., 2019) is: 34.7%   Values used to calculate the score:     Age: 16 years     Sex: Male     Is Non-Hispanic African American: No     Diabetic: Yes     Tobacco smoker: Yes     Systolic Blood Pressure: 144 mmHg     Is BP treated: No     HDL Cholesterol: 43.8 mg/dL     Total Cholesterol: 153 mg/dL       Relevant Medications   rosuvastatin (CRESTOR) 10 MG tablet     Digestive   Diverticulosis    No evidence of diverticulitis.  Diet and nutrition discussed.  Advised to increase amount of daily fiber intake.        Other   Right flank pain - Primary     Musculoskeletal.  No acute findings on CT scan of abdomen and pelvis.  Incidental findings discussed with patient.  Advised to take Tylenol and or NSAIDs as needed for pain.      Suspected sleep apnea    Recently diagnosed with sleep apnea.  Need to follow-up with Dr. Brett Fairy.      Arm pain, musculoskeletal, left    Benign findings.  Advised to take Tylenol and or NSAIDs as needed.      Patient Instructions  Health Maintenance After Age 57 After age 58, you are at a higher risk for certain long-term diseases and infections as well as injuries from falls. Falls are a major cause of broken bones and head injuries in people who are older than age 24. Getting regular preventive care can help to keep you healthy and well. Preventive care includes getting regular testing and making lifestyle changes as recommended by your health care provider. Talk with your health care provider about: Which screenings and tests you should have. A screening is a test that checks for a disease when you have no symptoms. A diet and exercise plan that is right for you. What should I know about screenings and tests to prevent falls? Screening and testing are the best ways to find a health problem early. Early diagnosis and treatment give you the best chance of managing medical conditions that are common after age 34. Certain conditions and lifestyle choices may make you more likely to have a fall.  Your health care provider may recommend: Regular vision checks. Poor vision and conditions such as cataracts can make you more likely to have a fall. If you wear glasses, make sure to get your prescription updated if your vision changes. Medicine review. Work with your health care provider to regularly review all of the medicines you are taking, including over-the-counter medicines. Ask your health care provider about any side effects that may make you more likely to have a fall. Tell your health care provider if any medicines  that you take make you feel dizzy or sleepy. Strength and balance checks. Your health care provider may recommend certain tests to check your strength and balance while standing, walking, or changing positions. Foot health exam. Foot pain and numbness, as well as not wearing proper footwear, can make you more likely to have a fall. Screenings, including: Osteoporosis screening. Osteoporosis is a condition that causes the bones to get weaker and break more easily. Blood pressure screening. Blood pressure changes and medicines to control blood pressure can make you feel dizzy. Depression screening. You may be more likely to have a fall if you have a fear of falling, feel depressed, or feel unable to do activities that you used to do. Alcohol use screening. Using too much alcohol can affect your balance and may make you more likely to have a fall. Follow these instructions at home: Lifestyle Do not drink alcohol if: Your health care provider tells you not to drink. If you drink alcohol: Limit how much you have to: 0-1 drink a day for women. 0-2 drinks a day for men. Know how much alcohol is in your drink. In the [place] one drink equals one 12 oz bottle of beer (355 mL), one 5 oz glass of wine (148 mL), or one 1 oz glass of hard liquor (44 mL). Do not use any products that contain nicotine or tobacco. These products include cigarettes, chewing tobacco, and vaping devices, such as e-cigarettes. If you need help quitting, ask your health care provider. Activity  Follow a regular exercise program to stay fit. This will help you maintain your balance. Ask your health care provider what types of exercise are appropriate for you. If you need a cane or walker, use it as recommended by your health care provider. Wear supportive shoes that have nonskid soles. Safety  Remove any tripping hazards, such as rugs, cords, and clutter. Install safety equipment such as grab bars in bathrooms and safety rails on  stairs. Keep rooms and walkways well-lit. General instructions Talk with your health care provider about your risks for falling. Tell your health care provider if: You fall. Be sure to tell your health care provider about all falls, even ones that seem minor. You feel dizzy, tiredness (fatigue), or off-balance. Take over-the-counter and prescription medicines only as told by your health care provider. These include supplements. Eat a healthy diet and maintain a healthy weight. A healthy diet includes low-fat dairy products, low-fat (lean) meats, and fiber from whole grains, beans, and lots of fruits and vegetables. Stay current with your vaccines. Schedule regular health, dental, and eye exams. Summary Having a healthy lifestyle and getting preventive care can help to protect your health and wellness after age 39. Screening and testing are the best way to find a health problem early and help you avoid having a fall. Early diagnosis and treatment give you the best chance for managing medical conditions that are more common for people who are older than age 53.  Falls are a major cause of broken bones and head injuries in people who are older than age 7. Take precautions to prevent a fall at home. Work with your health care provider to learn what changes you can make to improve your health and wellness and to prevent falls. This information is not intended to replace advice given to you by your health care provider. Make sure you discuss any questions you have with your health care provider. Document Revised: 01/06/2021 Document Reviewed: 01/06/2021 Elsevier Patient Education  [place], MD [facility]

## 2022-02-10 ENCOUNTER — Telehealth: Payer: Self-pay

## 2022-02-10 NOTE — Telephone Encounter (Signed)
Pt is calling to see if its ok to start taking OTC Fish oil '800mg'$ . It states consult with provider before stating.  Please advise

## 2022-02-10 NOTE — Telephone Encounter (Signed)
Called patient and left message in reference to provider response to patient medication question.

## 2022-02-10 NOTE — Telephone Encounter (Signed)
It is okay.  Thanks.

## 2022-02-12 ENCOUNTER — Telehealth: Payer: Self-pay | Admitting: Neurology

## 2022-02-12 ENCOUNTER — Encounter: Payer: Self-pay | Admitting: Neurology

## 2022-02-12 NOTE — Telephone Encounter (Signed)
Pt said have not heard from anyone. Would like a call from the nurse to discuss results and next step.

## 2022-02-12 NOTE — Telephone Encounter (Signed)
Dr Brett Fairy will read his study. She is still playing catch up from being on vacation. We will contact the patient soon as we have the results.

## 2022-02-15 NOTE — Addendum Note (Signed)
Addended by: Larey Seat on: 02/15/2022 03:22 PM   Modules accepted: Orders

## 2022-02-15 NOTE — Procedures (Signed)
PATIENT'S NAME:  Aaron [name], Aaron [name] DOB:      Oct 02, 1950      MR#:    [id number]     DATE OF RECORDING: 01/25/2022 [place]:  REFERRING M.D.:  [facility], MD/ Dr. Zadie Rhine  Study Performed:   CPAP  Titration HISTORY:  Patient returned for CPAP titration following a 01-01-2022 PSG study. Obstructive Sleep Apnea at 10.8/h was seen in an attended PSG study. REM AHI had been 27.5/h   Chief concern :  I have used an oximeter at home , that device was recommended by my wife's eye doctor, Dr Zadie Rhine, who evaluated her for glaucoma and thought to check for a OSA overlap. She tested positive and I used the device later on, now I am concerned about the data collected. " "I am easily fatigued, tired and feel not refreshed after 9 hours of sleep-. "I lost 30 pounds in 2022- and still snore".    The patient endorsed the Epworth Sleepiness Scale at 4 points.   The patient's weight 176 pounds with a height of 66 (inches), resulting in a BMI of 28.3 kg/m2. The patient's neck circumference measured 17 inches.  CURRENT MEDICATIONS: Glucophage, multivitamin, Vitamin B12  PROCEDURE:  This is a multichannel digital polysomnogram utilizing the SomnoStar 11.2 system.  Electrodes and sensors were applied and monitored per AASM Specifications.   EEG, EOG, Chin and Limb EMG, were sampled at 200 Hz.  ECG, Snore and Nasal Pressure, Thermal Airflow, Respiratory Effort, CPAP Flow and Pressure, Oximetry was sampled at 50 Hz. Digital video and audio were recorded.      CPAP was initiated at 5 cmH20 with heated humidity per AASM standards and pressure was advanced to 5/cmH20 because of hypopneas, apneas and desaturations.  At a PAP pressure of 5 cmH20, there was a reduction of the AHI to 0 with improvement of sleep apnea.  Lights Out was at 21:41 and Lights On at 05:00. Total recording time (TRT) was 439 minutes, with a total sleep time (TST) of 258.5 minutes. The patient's sleep latency was 58.5 minutes. REM latency was  35.5 minutes.  The sleep efficiency was 58.9 %.    SLEEP ARCHITECTURE: WASO (Wake after sleep onset) was 122 minutes.  There were 6 minutes in Stage N1, 202 minutes Stage N2, 0 minutes Stage N3 and 50.5 minutes in Stage REM.  The percentage of Stage N1 was 2.3%, Stage N2 was 78.1%, Stage N3 was 0% and Stage R (REM sleep) was 19.5%.   RESPIRATORY ANALYSIS:  There was a total of 0 respiratory events. The patient also had 0 respiratory event related arousals (RERAs).      The total APNEA/HYPOPNEA INDEX (AHI) was 0 /hour. The patient spent 69.5 minutes of total sleep time in the supine position and 189 minutes in non-supine. The supine AHI was 0.0, versus a non-supine AHI of 0.0.  OXYGEN SATURATION & C02:  The baseline 02 saturation was 95%, with the lowest being 88%. Time spent below 89% saturation equaled 0 minutes.  PERIODIC LIMB MOVEMENTS:  The patient had a total of 118 Periodic Limb Movements noted to appear in clusters-  The Periodic Limb Movement (PLM) index was 27.4 and the PLM Arousal index was 0.5 /hour.   Audio and video analysis did not show any abnormal or unusual movements, behaviors, phonations or vocalizations.  The patient has had 2 bathroom breaks during the study.  EKG was in keeping with normal sinus rhythm.   DIAGNOSIS Obstructive Sleep Apnea at 10.8/h was  seen in an attended PSG study. REM AHI had been 27.5/h  This mild OSA was easily overcome by only 5 cm water pressure using a nasal mask , ESON 2 in medium size.  PLANS/RECOMMENDATIONS: Start CPAP autotitration device set to 5 cm water pressure.  CPAP therapy compliance is defined as 4 hours or more of nightly PAP use.  Any apnea patient should avoid sedatives, hypnotics, and alcohol consumption at bedtime.   DISCUSSION: A follow up appointment will be scheduled in the Sleep Clinic at [facility].   Please call [phone] with any questions.      I certify that I have reviewed the entire raw data  recording prior to the issuance of this report in accordance with the Standards of Accreditation of the American Academy of Sleep Medicine (AASM)   Larey Seat, M.D. Diplomat, Tax adviser of Neurology  Diplomat, Tax adviser of Sleep Medicine Market researcher, [employer] [facility] at [employer]

## 2022-02-16 ENCOUNTER — Telehealth: Payer: Self-pay | Admitting: Neurology

## 2022-02-16 NOTE — Telephone Encounter (Signed)
We will be contacting the pt to review his sleep study results

## 2022-02-16 NOTE — Telephone Encounter (Signed)
Pt called back and accepted the 06-22 appointment, FYI no call back needed

## 2022-02-16 NOTE — Telephone Encounter (Signed)
Patient left a voicemail on my phone stating he got a mychart message stating to call to make a follow up appointment with Dr. Brett Fairy.

## 2022-02-16 NOTE — Telephone Encounter (Signed)
I called pt. I advised pt that Dr. Brett Fairy reviewed their sleep study results and found that pt was best treated with CPAP at a pressure of 5 cm water pressure. Dr. Brett Fairy recommends that pt starts auto CPAP. I reviewed PAP compliance expectations with the pt.  The patient began to ask questions in regards to alternative options. Advised that a dental device could be a alternative option but 90% of the time insurance doesn't cover it unless he has tried and failed CPAP. Also advised that while the CPAP treats his apnea effectively, the dental device may treat the overall sleep apnea but it is not likely that it will treat the REM stage apnea. It may only reduce that.  Advised inspire is another option but there are certain criteria that must be met and he would not meet that yet. The patient wouldn't want to go that route anyways. There was a total of 16 min spent on the phone answering questions but the patient states that he wants to talk over with his wife.   **The pt will call back to let me know if he is ok with me sending the order to [place] or if he would rather take the Thursday 22 nd apt at 10:30 am placed on hold with Dr Dohmeier to discuss further with her.  If pt does want to go ahead and send the order, please schedule him with for a initial CPAP follow up visit 70 days or so from today and let me know so I can send the pt a letter.

## 2022-02-16 NOTE — Telephone Encounter (Signed)
-----   Message from Larey Seat, MD sent at 02/15/2022  3:22 PM EDT ----- Audio and video analysis did not show any abnormal or unusual movements, behaviors, phonations or vocalizations.  The patient has had 2 bathroom breaks during the study.  EKG was in keeping with normal sinus rhythm.   DIAGNOSIS 2. Obstructive Sleep Apnea at 10.8/h was seen in an attended PSG study. REM AHI had been 27.5/h  This mild OSA was easily overcome by only 5 cm water pressure using a nasal mask , ESON 2 in medium size.  PLANS/RECOMMENDATIONS: Start CPAP autotitration device set to 5 cm water pressure.  1. CPAP therapy compliance is defined as 4 hours or more of nightly PAP use.  2. Any apnea patient should avoid sedatives, hypnotics, and alcohol consumption at bedtime.   DISCUSSION: A follow up appointment will be scheduled in the Sleep Clinic at [facility].   Please call [phone] with any questions.

## 2022-02-16 NOTE — Telephone Encounter (Signed)
See other phone note

## 2022-02-19 ENCOUNTER — Ambulatory Visit (INDEPENDENT_AMBULATORY_CARE_PROVIDER_SITE_OTHER): Payer: Medicare Other | Admitting: Neurology

## 2022-02-19 ENCOUNTER — Encounter: Payer: Self-pay | Admitting: Neurology

## 2022-02-19 VITALS — BP 130/76 | HR 76 | Ht 66.0 in | Wt 179.0 lb

## 2022-02-19 DIAGNOSIS — G478 Other sleep disorders: Secondary | ICD-10-CM | POA: Diagnosis not present

## 2022-02-19 DIAGNOSIS — R519 Headache, unspecified: Secondary | ICD-10-CM

## 2022-02-19 DIAGNOSIS — R5382 Chronic fatigue, unspecified: Secondary | ICD-10-CM

## 2022-02-19 DIAGNOSIS — G4733 Obstructive sleep apnea (adult) (pediatric): Secondary | ICD-10-CM | POA: Diagnosis not present

## 2022-02-19 NOTE — Patient Instructions (Signed)
I would like for [name] to be fitted at DME for autotitration CPAP , 6- 15 cm water 3 cm EPR.  He would like a nasal pillow or cradle and favors the dreamwear S/M cradle her tried here.

## 2022-03-12 ENCOUNTER — Encounter: Payer: Self-pay | Admitting: Emergency Medicine

## 2022-03-13 ENCOUNTER — Other Ambulatory Visit: Payer: Self-pay | Admitting: Emergency Medicine

## 2022-03-13 DIAGNOSIS — E1159 Type 2 diabetes mellitus with other circulatory complications: Secondary | ICD-10-CM

## 2022-03-13 DIAGNOSIS — E1165 Type 2 diabetes mellitus with hyperglycemia: Secondary | ICD-10-CM

## 2022-03-13 NOTE — Telephone Encounter (Signed)
He does not need to schedule a visit for this.  This refers to the urine test for microalbumin.  He can bring a urine sample to the lab.  I will put in an order.  Thanks.

## 2022-03-16 ENCOUNTER — Other Ambulatory Visit: Payer: Medicare Other

## 2022-03-16 DIAGNOSIS — I152 Hypertension secondary to endocrine disorders: Secondary | ICD-10-CM

## 2022-03-16 DIAGNOSIS — E1159 Type 2 diabetes mellitus with other circulatory complications: Secondary | ICD-10-CM | POA: Diagnosis not present

## 2022-03-16 LAB — MICROALBUMIN / CREATININE URINE RATIO
Creatinine,U: 52.5 mg/dL
Microalb Creat Ratio: 1.3 mg/g (ref 0.0–30.0)
Microalb, Ur: <0.7 mg/dL (ref 0.0–1.9)

## 2022-03-26 ENCOUNTER — Telehealth: Payer: Self-pay | Admitting: Neurology

## 2022-03-26 NOTE — Telephone Encounter (Signed)
Pt would like response to MyChart message sent this morning.

## 2022-03-26 NOTE — Telephone Encounter (Signed)
Noted. CB,RN replying to pt via mychart.

## 2022-03-30 ENCOUNTER — Other Ambulatory Visit: Payer: Self-pay | Admitting: Neurology

## 2022-03-30 DIAGNOSIS — G4733 Obstructive sleep apnea (adult) (pediatric): Secondary | ICD-10-CM

## 2022-03-30 DIAGNOSIS — G478 Other sleep disorders: Secondary | ICD-10-CM

## 2022-04-04 ENCOUNTER — Other Ambulatory Visit: Payer: Self-pay | Admitting: Emergency Medicine

## 2022-04-04 DIAGNOSIS — E1165 Type 2 diabetes mellitus with hyperglycemia: Secondary | ICD-10-CM

## 2022-04-06 ENCOUNTER — Telehealth: Payer: Self-pay | Admitting: Emergency Medicine

## 2022-04-06 NOTE — Telephone Encounter (Signed)
Left message for patient to call back to schedule Medicare Annual Wellness Visit   Last AWV  04/03/21  Please schedule at anytime with [facility] if patient calls the office back.      Any questions, please call me at [phone]

## 2022-05-07 ENCOUNTER — Ambulatory Visit (INDEPENDENT_AMBULATORY_CARE_PROVIDER_SITE_OTHER): Payer: Medicare Other | Admitting: Neurology

## 2022-05-07 ENCOUNTER — Encounter: Payer: Self-pay | Admitting: Neurology

## 2022-05-07 VITALS — BP 129/79 | HR 80 | Ht 67.0 in | Wt 185.0 lb

## 2022-05-07 DIAGNOSIS — G4733 Obstructive sleep apnea (adult) (pediatric): Secondary | ICD-10-CM | POA: Diagnosis not present

## 2022-05-07 DIAGNOSIS — Z9989 Dependence on other enabling machines and devices: Secondary | ICD-10-CM

## 2022-05-07 DIAGNOSIS — G44229 Chronic tension-type headache, not intractable: Secondary | ICD-10-CM | POA: Diagnosis not present

## 2022-05-07 DIAGNOSIS — R5382 Chronic fatigue, unspecified: Secondary | ICD-10-CM

## 2022-05-07 DIAGNOSIS — G478 Other sleep disorders: Secondary | ICD-10-CM

## 2022-05-07 DIAGNOSIS — M130 Polyarthritis, unspecified: Secondary | ICD-10-CM | POA: Insufficient documentation

## 2022-05-07 MED ORDER — VERAPAMIL HCL ER 120 MG PO TBCR: 120.0000 mg | EXTENDED_RELEASE_TABLET | Freq: Every day | ORAL | 2 refills | Status: DC

## 2022-05-07 NOTE — Progress Notes (Signed)
SLEEP MEDICINE CLINIC    Provider:  Larey Seat, MD  Primary Care Physician:  [name], MD [place] [address]     Referring Provider: [name], [place],  [place] [address]          Chief Complaint according to patient   Patient presents with:     New Patient (Initial Visit)     Pt is well, states he still having fatigue during the day and still having headaches but does sleep better        HISTORY OF PRESENT ILLNESS:   05-07-2022: RV Aaron [name] is a 71 y.o. year old  male patient of [place] descent, and he is seen herein a RV on 05/07/2022 from Dr. Mitchel Honour  for a sleep evaluation. He sees Dr Jaynee Eagles for headaches . He uses the my air application and gets updates about his apnea treatment, he feels he sleeps better but is still tired- fatigued.  7 hours of sleep on average 100% compliance for days and hours, residual AHI 4.3/h. His question is:" why am I still so sleepy &fatigued"  He was diagnosed with psoriasis. He has Reynaud's,  was worked up by dr Benjamine Mola, rheumatology. I reviewed his labs,  HA were evaluated by Dr Jaynee Eagles, he couldn't tolerate propanolol, felt even more fatigued. Should he try something else? His headaches respond to caffeine and are not associated with nausea or photophobia, he also has Tinnitus - vascular headaches? BP here is fine , yet tinnitus. Seen by Dr. Standley Dakins, Dr Percival Spanish and Debbora Presto, NP at [facility]>  HA is present  while he is a complaint CPAP user.   Shall we try a calcium channel blocker?      12/10/21 ALL:  Aaron Landmark returns for follow up for headaches. He was last seen 12/2020 and doing fairly well. We discussed starting topiramate but he was hesitant. PCP had started Ambien around that time. He was unable to tolerate and discontinued. He reports getting Covid 02/2021. He was pretty ill for about 2 months. Since, headaches have worsened. He has a daily headache. Pain is described  as a pressure sensation. No light sensitivity. Possible sound sensitivity with bad headaches. No nausea or worsening with activity. He wakes with headache every day. Headache seems to be better during the day. He feels that he is able to distract himself. He takes Excedrin 2 tabs 2-3 times a month for severe headaches. He drinks about 40-60 ounces of water daily.    He reports tinnitus started about 2 months ago. He was seen by ENT. He has concerns of possible sleep apnea. ENT and audiology and diagnosed with mild hearing loss, otherwise normal evaluation. He purchased an Emay continuous pulse oximeter.  that indicated multiple desaturations (nadir 84%). It is unclear how long O2 remains below 88% but appears to be around 10 minutes. PCP sent sleep referral 12/03/2020. He does not snore. He denies significant daytime sleepiness. He underwent in May 2023 a PSG . He returned for CPAP titration following a 01-01-2022 PSG study. Obstructive Sleep Apnea at 10.8/h was seen in an attended PSG study. REM AHI had been 27.5/h . We are meeting to discuss his reluctance to sr tart CPAP, and he is here to see which interfaces and which alternatives to CPAP are there. He was encouraged by a friend to give CPAP a go. He is worried about nasal congestion, and I suggested saline NS or  sinus rinse. We tested and tried 4 interfaces: he likes the dream-wear nasal cradle in s/m.     Chief concern according to patient :  I have used an oximeter at home , that device was recommended by my wife's eye doctor, Dr Zadie Rhine, who evaluated her for glaucoma and thought to check for a OSA overlap. Sh tested positive and I used the device later on, now I am concerned about the data collected. " "I am easily fatigued, tired and feel not refreshed after 9 hours of sleep-. "I lost 30 pounds in 2022- and still snore.    I have the pleasure of seeing Aaron [name] first time on 12-23-2021, , a right -handed White or Caucasian male with a  possible sleep disorder.  He has a past medical history of Diabetes mellitus without complication ([place]), GERD (gastroesophageal reflux disease), constant tension Headache and neck ache, hands with Reynaud's , Psoriatics, Hyperlipidemia, Hypertension, and Psoriasis.[place] Kitchen Headaches are non migrainous, these don't wake him and they are sometimes present in the moring when he wakes up. He has tinnitus high pitched in both ears.    Sleep relevant medical history: Nocturia 2 times, Tonsillectomy in childhood,DDD cervical spine, tension, ear : frequent otitis- no drum injuries.     Family medical /sleep history: nephew through sister - other family member on CPAP with OSA. He is a Immunologist.    Social history: Patient is retired from a Field seismologist. and lives in a household with  persons/ alone. Family status is married no pets.   Tobacco use; none .  ETOH use ; 2-3 a month ,  Caffeine intake in form of Coffee( 2 cups in AM ) . Regular exercise in form of walking.   Hobbies :photography.    Sleep habits are as follows: The patient's dinner time is between 7-8 PM. The patient goes to bed at  midnight  and struggles to go to sleep - tinnitus keeps him up- continues to sleep for 5-7 hours, wakes for 1-2 bathroom breaks, the first time at 2 AM.   The preferred sleep position is right side, with the support of 1 memory foam pillow. Dreams are reportedly frequent.  9 AM is the usual rise time. The patient wakes up spontaneously at 8-9.  He reports not feeling refreshed or restored in AM, with symptoms such as dry mouth, morning headaches, and residual fatigue. Naps are taken infrequently, lasting from 30 to 40 minutes and are refreshing sleep.    Review of Systems: Out of a complete 14 system review, the patient complains of only the following symptoms, and all other reviewed systems are negative.:  Fatigue, sleepiness , snoring, fragmented sleep, Insomnia = due to tinnitus.  Tinnitus  onset in 2014-17, after BPV.   Hearing loss. Dr Erik Obey 0350.     How likely are you to doze in the following situations: 0 = not likely, 1 = slight chance, 2 = moderate chance, 3 = high chance   Sitting and Reading? Watching Television? Sitting inactive in a public place (theater or meeting)? As a passenger in a car for an hour without a break? Lying down in the afternoon when circumstances permit? Sitting and talking to someone? Sitting quietly after lunch without alcohol? In a car, while stopped for a few minutes in traffic?   Total = 4/ 24 points   FSS endorsed at 56/ 63 points.   Social History   Socioeconomic History   Marital status: Married  Spouse name: Almyra Free   Number of children: 0   Years of education: Not on file   Highest education level: High school graduate  Occupational History   Occupation: Press photographer, retired   Tobacco Use   Smoking status: Former    Types: Cigarettes    Quit date: 1985    Years since quitting: 38.7   Smokeless tobacco: Never  Vaping Use   Vaping Use: Never used  Substance and Sexual Activity   Alcohol use: Yes    Alcohol/week: 7.0 standard drinks of alcohol    Types: 7 Cans of beer per week    Comment: 1 beer with dinner usually daily   Drug use: No   Sexual activity: Yes  Other Topics Concern   Not on file  Social History Narrative   Marital status:  Married.          Children:   none      Lives:      Employment:  Press photographer      Tobacco:  none      Alcohol:  About 7 drinks per week      Drugs:   none      Exercise:      Seatbelt:      Education: [employer].      Lives at home with wife   Right handed   Caffeine: 2 cups/day   Social Determinants of Health   Financial Resource Strain: Low Risk  (04/03/2021)   Overall Financial Resource Strain (CARDIA)    Difficulty of Paying Living Expenses: Not hard at all  Food Insecurity: No Food Insecurity (04/03/2021)   Hunger Vital Sign    Worried About Running Out of Food in the  Last Year: Never true    [place] in the Last Year: Never true  Transportation Needs: No Transportation Needs (04/03/2021)   PRAPARE - Hydrologist (Medical): No    Lack of Transportation (Non-Medical): No  Physical Activity: Sufficiently Active (04/03/2021)   Exercise Vital Sign    Days of Exercise per Week: 5 days    Minutes of Exercise per Session: 30 min  Stress: No Stress Concern Present (04/03/2021)   [place]    Feeling of Stress : Not at all  Social Connections: Socially Integrated (04/03/2021)   Social Connection and Isolation Panel [NHANES]    Frequency of Communication with Friends and Family: More than three times a week    Frequency of Social Gatherings with Friends and Family: More than three times a week    Attends Religious Services: 1 to 4 times per year    Active Member of [employer] or Organizations: Yes    Attends Archivist Meetings: 1 to 4 times per year    Marital Status: Married    Family History  Problem Relation Age of Onset   Cancer Mother        breast ca spread to bone   Cancer Father 61       brain cancer   Migraines Neg Hx    Headache Neg Hx     Past Medical History:  Diagnosis Date   Diabetes mellitus without complication ([place])    GERD (gastroesophageal reflux disease)    Hyperlipidemia    Hypertension    Psoriasis     History reviewed. No pertinent surgical history.   Current Outpatient Medications on File Prior to Visit  Medication Sig Dispense Refill  metFORMIN (GLUCOPHAGE-XR) 500 MG 24 hr tablet TAKE 1 TABLET BY MOUTH EVERY DAY AT 8PM 180 tablet 0   Multiple Vitamin (MULTI-VITAMIN PO) Take by mouth daily.     Omega-3 Fatty Acids (FISH OIL PO) Take 800 mg by mouth daily.     rosuvastatin (CRESTOR) 10 MG tablet Take 1 tablet (10 mg total) by mouth daily. 90 tablet 3   triamcinolone (KENALOG) 0.1 % Apply 1 application topically 2 (two)  times daily. 30 g 3   vitamin B-12 (CYANOCOBALAMIN) 500 MCG tablet Take 500 mcg by mouth daily.     No current facility-administered medications on file prior to visit.    Allergies  Allergen Reactions   Erythromycin Itching   Levaquin [Levofloxacin In D5w]     Light sensitivity     Physical exam:  Today's Vitals   05/07/22 1314  BP: 129/79  Pulse: 80  Weight: 185 lb (83.9 kg)  Height: '5\' 7"'  (1.702 m)   Body mass index is 28.98 kg/m.   Wt Readings from Last 3 Encounters:  05/07/22 185 lb (83.9 kg)  02/19/22 179 lb (81.2 kg)  02/09/22 179 lb (81.2 kg)     Ht Readings from Last 3 Encounters:  05/07/22 '5\' 7"'  (1.702 m)  02/19/22 '5\' 6"'  (1.676 m)  02/09/22 '5\' 6"'  (1.676 m)      General: The patient is awake, alert and appears not in acute distress. The patient is well groomed. Head: Normocephalic, atraumatic. Neck is supple.  Mallampati 3,  neck circumference:17 inches .  Nasal airflow patent.  Retrognathia is not seen.  Dental status: biological  Cardiovascular:  Regular rate and cardiac rhythm by pulse,  without distended neck veins. Respiratory: Lungs are clear to auscultation.  Skin:  Without evidence of ankle edema, or rash. Trunk: The patient's posture is erect.   Neurologic exam : The patient is awake and alert, oriented to place and time.   Memory subjective described as intact.  Attention span & concentration ability appears normal.  Speech is fluent,  without  dysarthria, dysphonia or aphasia.  Mood and affect are appropriate.   Cranial nerves: no loss of smell or taste reported  Pupils are equal and briskly reactive to light. Funduscopic exam deferred. .  Extraocular movements in vertical and horizontal planes were intact and without nystagmus. No Diplopia. Visual fields by finger perimetry are intact. Hearing was intact to soft voice and finger rubbing.    Facial sensation intact to fine touch.  Facial motor strength is symmetric and tongue and uvula  move midline.  Neck ROM : rotation, tilt and flexion extension were normal for age and shoulder shrug was symmetrical.    Motor exam:  Symmetric bulk, tone and ROM.   Normal tone without cog-wheeling, symmetric grip strength .   Sensory:  Fine touch, pinprick and vibration were tested  and  normal.  Proprioception tested in the upper extremities was normal.   Coordination: Rapid alternating movements in the fingers/hands were of normal speed.  The Finger-to-nose maneuver was intact without evidence of ataxia, dysmetria or tremor.   Gait and station: Patient could rise unassisted from a seated position, walked without assistive device.  Stance is of normal width/ base .  Toe and heel walk were deferred.  Deep tendon reflexes: in the  upper and lower extremities are symmetric and knee reflex is very brisk-  intact.  Babinski response was deferred .       After spending a total time of  30  minutes: Reports printed from his pulse-oxymetry from 4-9-through 12-12-2021. face to face and additional time for physical and neurologic examination, review of laboratory studies,  personal review of imaging studies, reports and results of other testing and review of referral information / records as far as provided in visit, I have established the following assessments:  1) Clusters from brief hypoxia in early morning hours are likely related to REM sleep. Other areas are suggestive of motion artefact. This is best treated with CPAP, REM sleep apnea always does better with PAP.  2) persistent fatigue on compliant CPAP. He sleeps better - dreams more on CPAP yet is still tired.   3) psoriatic condition - could this be HLA B27 positive arthritis , it would explain some fatigue.      My Plan is to proceed with:  1) I would like for Mr. Earmon Phoenix to continue  autotitration CPAP , 6- 15 cm water 3 cm EPR.  He would like a nasal pillow or cradle and favors the dream-wear S/M cradle he tried here.   2) I  offered a trial of 30 days on Verapamil, hoping he would have less HA .   I would like to thank [name], MD and Sarina Ill, MD  for allowing me to meet with and to take care of this pleasant patient.   In short, DAISEAN BRODHEAD is presenting with mild but REM dependent  OSA and concerns about nasal patency .   I plan to follow up either personally or through our NP within 12  months   CC: I will share my notes with PCP .  Electronically signed by: Larey Seat, MD 05/07/2022 2:03 PM  [facility] and [facility] Board certified by The [employer] of Sleep Medicine and Diplomate of the [employer] of Sleep Medicine. Board certified In Neurology through the [place], Fellow of the [employer] of Neurology. Medical Director of [facility].

## 2022-05-07 NOTE — Patient Instructions (Signed)
Verapamil Sustained-Release Capsules What is this medication? VERAPAMIL (ver AP a mil) treats high blood pressure and prevents chest pain (angina). It works by relaxing the blood vessels, which helps decrease the amount of work your heart has to do. It belongs to a group of medications called calcium channel blockers. This medicine may be used for other purposes; ask your health care provider or pharmacist if you have questions. COMMON BRAND NAME(S): Verelan, Verelan PM What should I tell my care team before I take this medication? They need to know if you have any of these conditions: Duchenne muscular dystrophy Heart disease Irregular heartbeat or rhythm Liver disease Low blood pressure An unusual or allergic reaction to verapamil, other medications, foods, dyes or preservatives Pregnant or trying to get pregnant Breast-feeding How should I use this medication? Take this medication by mouth with water. Take it as directed on the prescription label at the same time every day. Do not cut, crush or chew this medication. Swallow the capsules whole. You may open the capsule and put the contents in 1 teaspoon of applesauce. Swallow the medication and applesauce right away. Do not chew the medication or applesauce. Keep taking it unless your care team tells you to stop. Do not take this medication with grapefruit juice. Talk to your care team about the use of this medication in children. Special care may be needed. Overdosage: If you think you have taken too much of this medicine contact a poison control center or emergency room at once. NOTE: This medicine is only for you. Do not share this medicine with others. What if I miss a dose? If you miss a dose, take it as soon as you can. If it is almost time for your next dose, take only that dose. Do not take double or extra doses. What may interact with this medication? Do not take this medication with any of the  following: Cisapride Disopyramide Dofetilide Grapefruit juice [place] Pimozide Red yeast rice This medication may also interact with the following: Barbiturates such as phenobarbital Cimetidine Cyclosporine Lithium Local anesthetics or general anesthetics Medications for heart rhythm problems like amiodarone, digoxin, flecainide, procainamide, quinidine Medications for high blood pressure or heart problems Medications for seizures like carbamazepine and phenytoin Rifampin, rifabutin or rifapentine Theophylline or aminophylline This list may not describe all possible interactions. Give your health care provider a list of all the medicines, herbs, non-prescription drugs, or dietary supplements you use. Also tell them if you smoke, drink alcohol, or use illegal drugs. Some items may interact with your medicine. What should I watch for while using this medication? Visit your care team for regular checks on your progress. Check your blood pressure as directed. Ask your care team what your blood pressure should be. Also, find out when you should contact them. Do not treat yourself for coughs, colds, or pain while you are using this medication without asking your care team for advice. Some medications may increase your blood pressure. You may get drowsy or dizzy. Do not drive, use machinery, or do anything that needs mental alertness until you know how this medication affects you. Do not stand up or sit up quickly, especially if you are an older patient. This reduces the risk of dizzy or fainting spells. What side effects may I notice from receiving this medication? Side effects that you should report to your care team as soon as possible: Allergic reactions--skin rash, itching, hives, swelling of the face, lips, tongue, or throat Heart failure--shortness of breath,  swelling of the ankles, feet, or hands, sudden weight gain, unusual weakness or fatigue Slow heartbeat--dizziness, feeling faint  or lightheaded, trouble breathing, unusual weakness or fatigue Liver injury--right upper belly pain, loss of appetite, nausea, light-colored stool, dark yellow or brown urine, yellowing skin or eyes, unusual weakness or fatigue Low blood pressure--dizziness, feeling faint or lightheaded, blurry vision Side effects that usually do not require medical attention (report to your care team if they continue or are bothersome): Constipation Dizziness Headache Nausea This list may not describe all possible side effects. Call your doctor for medical advice about side effects. You may report side effects to FDA at 1-800-FDA-1088. Where should I keep my medication? Keep out of the reach of children and pets. Store at room temperature between 20 and 25 degrees C (68 and 77 degrees F). Protect from moisture. Keep the container tightly closed. Avoid exposure to extreme heat. Throw away any unused medication after the expiration date. NOTE: This sheet is a summary. It may not cover all possible information. If you have questions about this medicine, talk to your doctor, pharmacist, or health care provider.  2023 Elsevier/Gold Standard (2020-08-09 00:00:00)

## 2022-05-12 ENCOUNTER — Encounter: Payer: Self-pay | Admitting: Neurology

## 2022-05-19 DIAGNOSIS — L814 Other melanin hyperpigmentation: Secondary | ICD-10-CM | POA: Diagnosis not present

## 2022-05-19 DIAGNOSIS — L298 Other pruritus: Secondary | ICD-10-CM | POA: Diagnosis not present

## 2022-05-19 DIAGNOSIS — D225 Melanocytic nevi of trunk: Secondary | ICD-10-CM | POA: Diagnosis not present

## 2022-05-19 DIAGNOSIS — L821 Other seborrheic keratosis: Secondary | ICD-10-CM | POA: Diagnosis not present

## 2022-05-19 DIAGNOSIS — L280 Lichen simplex chronicus: Secondary | ICD-10-CM | POA: Diagnosis not present

## 2022-05-19 DIAGNOSIS — L400 Psoriasis vulgaris: Secondary | ICD-10-CM | POA: Diagnosis not present

## 2022-06-10 ENCOUNTER — Other Ambulatory Visit: Payer: Self-pay | Admitting: Neurology

## 2022-06-10 MED ORDER — ARMODAFINIL 50 MG PO TABS: 100.0000 mg | ORAL_TABLET | Freq: Every day | ORAL | 0 refills | Status: DC

## 2022-06-10 NOTE — Telephone Encounter (Signed)
The  HLA test is negative,  We were not able to find a correlation ( genetic) to types of arthritis, spondylitis, based on auto-immune disorder.

## 2022-06-11 ENCOUNTER — Telehealth: Payer: Self-pay | Admitting: *Deleted

## 2022-06-11 NOTE — Telephone Encounter (Signed)
Submitted PA Armodafinil on CMM. Key: [id number]. Waiting on determination from [facility].

## 2022-06-11 NOTE — Telephone Encounter (Signed)
PA approved 08/31/2021 - 06/11/2023.

## 2022-06-14 DIAGNOSIS — Z23 Encounter for immunization: Secondary | ICD-10-CM | POA: Diagnosis not present

## 2022-06-22 NOTE — Progress Notes (Signed)
HLA B 27 is negative. No higher risk for ankylosing spondylitis or immune reactive arthritis

## 2022-06-23 NOTE — Progress Notes (Unsigned)
Subjective:   Aaron [name] is a 71 y.o. male who presents for Medicare Annual/Subsequent preventive examination. I connected with  Aaron [name] on 06/24/22 by a audio enabled telemedicine application and verified that I am speaking with the correct person using two identifiers.  Patient Location: Home  Provider Location: Home Office  I discussed the limitations of evaluation and management by telemedicine. The patient expressed understanding and agreed to proceed.  Review of Systems    Deferred to PCP       Objective:    There were no vitals filed for this visit. There is no height or weight on file to calculate BMI.     06/24/2022    9:29 AM 04/03/2021    4:23 PM 10/25/2019   11:07 AM  Advanced Directives  Does Patient Have a Medical Advance Directive? No Yes No  Type of Advance Directive  Living will;Healthcare Power of Attorney   Does patient want to make changes to medical advance directive?  No - Patient declined   Would patient like information on creating a medical advance directive? No - Patient declined  Yes (ED - Information included in AVS)    Current Medications (verified) Outpatient Encounter Medications as of 06/24/2022  Medication Sig   metFORMIN (GLUCOPHAGE-XR) 500 MG 24 hr tablet TAKE 1 TABLET BY MOUTH EVERY DAY AT 8PM   Multiple Vitamin (MULTI-VITAMIN PO) Take by mouth daily.   Omega-3 Fatty Acids (FISH OIL PO) Take 800 mg by mouth daily.   rosuvastatin (CRESTOR) 10 MG tablet Take 1 tablet (10 mg total) by mouth daily.   triamcinolone (KENALOG) 0.1 % Apply 1 application topically 2 (two) times daily.   verapamil (CALAN-SR) 120 MG CR tablet Take 1 tablet (120 mg total) by mouth at bedtime.   vitamin B-12 (CYANOCOBALAMIN) 500 MCG tablet Take 500 mcg by mouth daily.   Armodafinil 50 MG tablet Take 2 tablets (100 mg total) by mouth daily. Take 1 tablet in the am and may increase to 2 tablet in the morning if needed. (Patient not taking: Reported on  06/24/2022)   No facility-administered encounter medications on file as of 06/24/2022.    Allergies (verified) Erythromycin and Levaquin [levofloxacin in d5w]   History: Past Medical History:  Diagnosis Date   Diabetes mellitus without complication (HCC)    GERD (gastroesophageal reflux disease)    Hyperlipidemia    Hypertension    Psoriasis    History reviewed. No pertinent surgical history. Family History  Problem Relation Age of Onset   Cancer Mother        breast ca spread to bone   Cancer Father 9       brain cancer   Migraines Neg Hx    Headache Neg Hx    Social History   Socioeconomic History   Marital status: Married    Spouse name: Almyra Free   Number of children: 0   Years of education: Not on file   Highest education level: High school graduate  Occupational History   Occupation: Press photographer, retired   Tobacco Use   Smoking status: Former    Types: Cigarettes    Quit date: 1985    Years since quitting: 38.8   Smokeless tobacco: Never  Vaping Use   Vaping Use: Never used  Substance and Sexual Activity   Alcohol use: Yes    Alcohol/week: 7.0 standard drinks of alcohol    Types: 7 Cans of beer per week    Comment: 1 beer with  dinner usually daily   Drug use: No   Sexual activity: Yes  Other Topics Concern   Not on file  Social History Narrative   Marital status:  Married.          Children:   none      Lives:      Employment:  Press photographer      Tobacco:  none      Alcohol:  About 7 drinks per week      Drugs:   none      Exercise:      Seatbelt:      Education: [employer].      Lives at home with wife   Right handed   Caffeine: 2 cups/day   Social Determinants of Health   Financial Resource Strain: Low Risk  (06/24/2022)   Overall Financial Resource Strain (CARDIA)    Difficulty of Paying Living Expenses: Not hard at all  Food Insecurity: No Food Insecurity (06/24/2022)   Hunger Vital Sign    Worried About Running Out of Food in the Last Year:  Never true    Ran Out of Food in the Last Year: Never true  Transportation Needs: No Transportation Needs (06/24/2022)   PRAPARE - Hydrologist (Medical): No    Lack of Transportation (Non-Medical): No  Physical Activity: Sufficiently Active (06/24/2022)   Exercise Vital Sign    Days of Exercise per Week: 5 days    Minutes of Exercise per Session: 30 min  Stress: No Stress Concern Present (06/24/2022)   [place]    Feeling of Stress : Not at all  Social Connections: Unknown (06/24/2022)   Social Connection and Isolation Panel [NHANES]    Frequency of Communication with Friends and Family: More than three times a week    Frequency of Social Gatherings with Friends and Family: More than three times a week    Attends Religious Services: More than 4 times per year    Active Member of [employer] or Organizations: Yes    Attends Music therapist: More than 4 times per year    Marital Status: Not on file    Tobacco Counseling Counseling given: Not Answered   Clinical Intake:  Pre-visit preparation completed: Yes  Pain : No/denies pain     Nutritional Status: BMI 25 -29 Overweight Nutritional Risks: None Diabetes: Yes CBG done?: No (phone visit) Did pt. bring in CBG monitor from home?: No (phone visit)  How often do you need to have someone help you when you read instructions, pamphlets, or other written materials from your doctor or pharmacy?: 1 - Never What is the last grade level you completed in school?: 12th  Diabetic?Yes Nutrition Risk Assessment:  Has the patient had any N/V/D within the last 2 months?  No  Does the patient have any non-healing wounds?  No  Has the patient had any unintentional weight loss or weight gain?  No   Diabetes:  Is the patient diabetic?  Yes  If diabetic, was a CBG obtained today?  No  Did the patient bring in their glucometer from  home?  No  How often do you monitor your CBG's? daily.   Financial Strains and Diabetes Management:  Are you having any financial strains with the device, your supplies or your medication? No .  Does the patient want to be seen by Chronic Care Management for management of their diabetes?  No  Would the patient like to be referred to a Nutritionist or for Diabetic Management?  No   Diabetic Exams:  Diabetic Eye Exam: Completed 11/11/21 Diabetic Foot Exam: Overdue, Pt has been advised about the importance in completing this exam. Pt is scheduled for diabetic foot exam on deferred to PCP.   Interpreter Needed?: No  Information entered by :: Emelia Loron RN   Activities of Daily Living     No data to display          Patient Care Team: [name], MD as PCP - General (Internal Medicine) Pa, [place] as Consulting Physician (Optometry)  Indicate any recent Medical Services you may have received from other than [facility] providers in the past year (date may be approximate).     Assessment:   This is a routine wellness examination for Kamarii.  Hearing/Vision screen No results found.  Dietary issues and exercise activities discussed:     Goals Addressed             This Visit's Progress    Patient Stated       I would like to lose weight by decreasing carbohydrates.      Depression Screen    06/24/2022    9:42 AM 02/09/2022    1:15 PM 01/13/2022    1:58 PM 12/02/2021   10:39 AM 04/03/2021    4:20 PM 01/08/2021    1:25 PM 12/23/2020   11:05 AM  PHQ 2/9 Scores  PHQ - 2 Score '1 2 2 '$ 0 '2 5 2  '$ PHQ- 9 Score  '7 9   16 6    '$ Fall Risk    06/24/2022    9:30 AM 02/09/2022    1:15 PM 01/13/2022    1:58 PM 12/02/2021   10:39 AM 04/03/2021    4:24 PM  Fall Risk   Falls in the past year? 0 0 0 0 1  Number falls in past yr: 0    0  Injury with Fall? 0    1  Follow up     Falls evaluation completed    FALL RISK PREVENTION PERTAINING TO THE HOME:  Any stairs  in or around the home? Yes  If so, are there any without handrails? Yes  Home free of loose throw rugs in walkways, pet beds, electrical cords, etc? Yes  Adequate lighting in your home to reduce risk of falls? Yes   ASSISTIVE DEVICES UTILIZED TO PREVENT FALLS:  Life alert? No  Use of a cane, walker or w/c? No  Grab bars in the bathroom? Yes  Shower chair or bench in shower? No  Elevated toilet seat or a handicapped toilet? No   Cognitive Function:        06/24/2022    9:30 AM 10/25/2019   11:07 AM  6CIT Screen  What Year? 0 points 0 points  What month? 0 points 0 points  What time? 0 points 0 points  Count back from 20 0 points 0 points  Months in reverse 0 points 0 points  Repeat phrase 2 points 0 points  Total Score 2 points 0 points    Immunizations Immunization History  Administered Date(s) Administered   Fluad Quad(high Dose 65+) 05/29/2019, 06/06/2020   Influenza,inj,Quad PF,6+ Mos 06/28/2014, 04/29/2015, 07/13/2016, 05/31/2017, 06/11/2018   Influenza-Unspecified 06/28/2021   Moderna SARS-COV2 Booster Vaccination 07/29/2021   Moderna Sars-Covid-2 Vaccination 11/09/2019, 12/12/2019   Pneumococcal Conjugate-13 04/29/2015, 03/25/2018   Pneumococcal Polysaccharide-23 04/06/2014, 05/29/2019   Td 03/30/2016  Tdap 12/21/2005   Zoster Recombinat (Shingrix) 04/08/2021, 07/29/2021   Zoster, Live 11/25/2015    TDAP status: Up to date  Flu Vaccine status: Due, Education has been provided regarding the importance of this vaccine. Advised may receive this vaccine at local pharmacy or Health Dept. Aware to provide a copy of the vaccination record if obtained from local pharmacy or Health Dept. Verbalized acceptance and understanding.  Pneumococcal vaccine status: Up to date  Covid-19 vaccine status: Information provided on how to obtain vaccines.   Qualifies for Shingles Vaccine? Yes   Zostavax completed No   Shingrix Completed?: Yes  Screening Tests Health  Maintenance  Topic Date Due   COLONOSCOPY (Pts 45-75yr Insurance coverage will need to be confirmed)  09/30/2020   FOOT EXAM  06/12/2021   COVID-19 Vaccine (3 - Moderna risk series) 08/26/2021   HEMOGLOBIN A1C  10/09/2021   INFLUENZA VACCINE  11/29/2022 (Originally 03/31/2022)   Diabetic kidney evaluation - GFR measurement  08/15/2022   OPHTHALMOLOGY EXAM  11/12/2022   Diabetic kidney evaluation - Urine ACR  03/17/2023   Medicare Annual Wellness (AWV)  07/25/2023   TETANUS/TDAP  03/30/2026   Pneumonia Vaccine 71 Years old  Completed   Hepatitis C Screening  Completed   Zoster Vaccines- Shingrix  Completed   HPV VACCINES  Aged Out    Health Maintenance  Health Maintenance Due  Topic Date Due   COLONOSCOPY (Pts 45-431yrInsurance coverage will need to be confirmed)  09/30/2020   FOOT EXAM  06/12/2021   COVID-19 Vaccine (3 - Moderna risk series) 08/26/2021   HEMOGLOBIN A1C  10/09/2021    Colorectal cancer screening: Type of screening: Colonoscopy. Completed 09/30/17. Repeat every 3-5  years Patient states he will discuss with Dr. HuBenson [place] Lung Cancer Screening: (Low Dose CT Chest recommended if Age 69-80ears, 30 pack-year currently smoking OR have quit w/in 15years.) does not qualify.   Additional Screening:  Hepatitis C Screening: does qualify; Completed 11/13/15  Vision Screening: Recommended annual ophthalmology exams for early detection of glaucoma and other disorders of the eye. Is the patient up to date with their annual eye exam?  Yes  Who is the provider or what is the name of the office in which the patient attends annual eye exams? [facility] with a provider, would they like to be referred to a provider to establish care?  N/A .   Dental Screening: Recommended annual dental exams for proper oral hygiene  Community Resource Referral / Chronic Care Management: CRR required this visit?  No   CCM required this visit?  No      Plan:      I have personally reviewed and noted the following in the patient's chart:   Medical and social history Use of alcohol, tobacco or illicit drugs  Current medications and supplements including opioid prescriptions. Patient is not currently taking opioid prescriptions. Functional ability and status Nutritional status Physical activity Advanced directives List of other physicians Hospitalizations, surgeries, and ER visits in previous 12 months Vitals Screenings to include cognitive, depression, and falls Referrals and appointments  In addition, I have reviewed and discussed with patient certain preventive protocols, quality metrics, and best practice recommendations. A written personalized care plan for preventive services as well as general preventive health recommendations were provided to patient.     JiMichiel CowboyRN   06/24/2022   Nurse Notes:  Mr. LiSainvil Thank you for taking time to come for  your Medicare Wellness Visit. I appreciate your ongoing commitment to your health goals. Please review the following plan we discussed and let me know if I can assist you in the future.   These are the goals we discussed:  Goals       Patient Stated     Patient Stated (pt-stated)      My goal is to stay on track with my weight loss.  I would like to do more free weights to gain more arm muscle.  I will continue to do intermittent fasting, watching my diet and increasing my water intake.       Other     Patient Stated      I would like to lose weight by decreasing carbohydrates.      Weight (lb) < 200 lb (90.7 kg)      25 pounds loose.        This is a list of the screening recommended for you and due dates:  Health Maintenance  Topic Date Due   Colon Cancer Screening  09/30/2020   Complete foot exam   06/12/2021   COVID-19 Vaccine (3 - Moderna risk series) 08/26/2021   Hemoglobin A1C  10/09/2021   Flu Shot  11/29/2022*   Yearly kidney function blood test for diabetes   08/15/2022   Eye exam for diabetics  11/12/2022   Yearly kidney health urinalysis for diabetes  03/17/2023   Medicare Annual Wellness Visit  07/25/2023   Tetanus Vaccine  03/30/2026   Pneumonia Vaccine  Completed   Hepatitis C Screening: USPSTF Recommendation to screen - Ages 18-79 yo.  Completed   Zoster (Shingles) Vaccine  Completed   HPV Vaccine  Aged Out  *Topic was postponed. The date shown is not the original due date.

## 2022-06-23 NOTE — Patient Instructions (Signed)
Health Maintenance, Male Adopting a healthy lifestyle and getting preventive care are important in promoting health and wellness. Ask your health care provider about: The right schedule for you to have regular tests and exams. Things you can do on your own to prevent diseases and keep yourself healthy. What should I know about diet, weight, and exercise? Eat a healthy diet  Eat a diet that includes plenty of vegetables, fruits, low-fat dairy products, and lean protein. Do not eat a lot of foods that are high in solid fats, added sugars, or sodium. Maintain a healthy weight Body mass index (BMI) is a measurement that can be used to identify possible weight problems. It estimates body fat based on height and weight. Your health care provider can help determine your BMI and help you achieve or maintain a healthy weight. Get regular exercise Get regular exercise. This is one of the most important things you can do for your health. Most adults should: Exercise for at least 150 minutes each week. The exercise should increase your heart rate and make you sweat (moderate-intensity exercise). Do strengthening exercises at least twice a week. This is in addition to the moderate-intensity exercise. Spend less time sitting. Even light physical activity can be beneficial. Watch cholesterol and blood lipids Have your blood tested for lipids and cholesterol at 69 years of age, then have this test every 5 years. You may need to have your cholesterol levels checked more often if: Your lipid or cholesterol levels are high. You are older than 69 years of age. You are at high risk for heart disease. What should I know about cancer screening? Many types of cancers can be detected early and may often be prevented. Depending on your health history and family history, you may need to have cancer screening at various ages. This may include screening for: Colorectal cancer. Prostate cancer. Skin cancer. Lung  cancer. What should I know about heart disease, diabetes, and high blood pressure? Blood pressure and heart disease High blood pressure causes heart disease and increases the risk of stroke. This is more likely to develop in people who have high blood pressure readings or are overweight. Talk with your health care provider about your target blood pressure readings. Have your blood pressure checked: Every 3-5 years if you are 18-39 years of age. Every year if you are 40 years old or older. If you are between the ages of 65 and 75 and are a current or former smoker, ask your health care provider if you should have a one-time screening for abdominal aortic aneurysm (AAA). Diabetes Have regular diabetes screenings. This checks your fasting blood sugar level. Have the screening done: Once every three years after age 45 if you are at a normal weight and have a low risk for diabetes. More often and at a younger age if you are overweight or have a high risk for diabetes. What should I know about preventing infection? Hepatitis B If you have a higher risk for hepatitis B, you should be screened for this virus. Talk with your health care provider to find out if you are at risk for hepatitis B infection. Hepatitis C Blood testing is recommended for: Everyone born from 1945 through 1965. Anyone with known risk factors for hepatitis C. Sexually transmitted infections (STIs) You should be screened each year for STIs, including gonorrhea and chlamydia, if: You are sexually active and are younger than 69 years of age. You are older than 69 years of age and your   health care provider tells you that you are at risk for this type of infection. Your sexual activity has changed since you were last screened, and you are at increased risk for chlamydia or gonorrhea. Ask your health care provider if you are at risk. Ask your health care provider about whether you are at high risk for HIV. Your health care provider  may recommend a prescription medicine to help prevent HIV infection. If you choose to take medicine to prevent HIV, you should first get tested for HIV. You should then be tested every 3 months for as long as you are taking the medicine. Follow these instructions at home: Alcohol use Do not drink alcohol if your health care provider tells you not to drink. If you drink alcohol: Limit how much you have to 0-2 drinks a day. Know how much alcohol is in your drink. In the [place] one drink equals one 12 oz bottle of beer (355 mL), one 5 oz glass of Dazaria Macneill (148 mL), or one 1 oz glass of hard liquor (44 mL). Lifestyle Do not use any products that contain nicotine or tobacco. These products include cigarettes, chewing tobacco, and vaping devices, such as e-cigarettes. If you need help quitting, ask your health care provider. Do not use street drugs. Do not share needles. Ask your health care provider for help if you need support or information about quitting drugs. General instructions Schedule regular health, dental, and eye exams. Stay current with your vaccines. Tell your health care provider if: You often feel depressed. You have ever been abused or do not feel safe at home. Summary Adopting a healthy lifestyle and getting preventive care are important in promoting health and wellness. Follow your health care provider's instructions about healthy diet, exercising, and getting tested or screened for diseases. Follow your health care provider's instructions on monitoring your cholesterol and blood pressure. This information is not intended to replace advice given to you by your health care provider. Make sure you discuss any questions you have with your health care provider. Document Revised: 01/06/2021 Document Reviewed: 01/06/2021 Elsevier Patient Education  2023 Elsevier Inc.  

## 2022-06-24 ENCOUNTER — Encounter: Payer: Self-pay | Admitting: Emergency Medicine

## 2022-06-24 ENCOUNTER — Ambulatory Visit (INDEPENDENT_AMBULATORY_CARE_PROVIDER_SITE_OTHER): Payer: Medicare Other | Admitting: *Deleted

## 2022-06-24 DIAGNOSIS — Z0000 Encounter for general adult medical examination without abnormal findings: Secondary | ICD-10-CM | POA: Diagnosis not present

## 2022-06-29 DIAGNOSIS — Z86010 Personal history of colonic polyps: Secondary | ICD-10-CM | POA: Diagnosis not present

## 2022-06-29 DIAGNOSIS — L290 Pruritus ani: Secondary | ICD-10-CM | POA: Diagnosis not present

## 2022-07-07 NOTE — Progress Notes (Signed)
Order(s) created erroneously. Erroneous order ID: [id number]  Order canceled by: CHART CORRECTION ANALYST TWELVE, IDENTITY  Order cancel date/time: 07/07/2022 11:18 AM

## 2022-07-08 DIAGNOSIS — R58 Hemorrhage, not elsewhere classified: Secondary | ICD-10-CM | POA: Diagnosis not present

## 2022-07-08 DIAGNOSIS — L718 Other rosacea: Secondary | ICD-10-CM | POA: Diagnosis not present

## 2022-07-08 DIAGNOSIS — D485 Neoplasm of uncertain behavior of skin: Secondary | ICD-10-CM | POA: Diagnosis not present

## 2022-07-08 DIAGNOSIS — L298 Other pruritus: Secondary | ICD-10-CM | POA: Diagnosis not present

## 2022-07-08 DIAGNOSIS — L820 Inflamed seborrheic keratosis: Secondary | ICD-10-CM | POA: Diagnosis not present

## 2022-07-08 DIAGNOSIS — L218 Other seborrheic dermatitis: Secondary | ICD-10-CM | POA: Diagnosis not present

## 2022-07-08 DIAGNOSIS — L249 Irritant contact dermatitis, unspecified cause: Secondary | ICD-10-CM | POA: Diagnosis not present

## 2022-07-08 DIAGNOSIS — L538 Other specified erythematous conditions: Secondary | ICD-10-CM | POA: Diagnosis not present

## 2022-07-21 DIAGNOSIS — L239 Allergic contact dermatitis, unspecified cause: Secondary | ICD-10-CM | POA: Diagnosis not present

## 2022-07-21 DIAGNOSIS — L720 Epidermal cyst: Secondary | ICD-10-CM | POA: Diagnosis not present

## 2022-07-21 DIAGNOSIS — L400 Psoriasis vulgaris: Secondary | ICD-10-CM | POA: Diagnosis not present

## 2022-08-24 ENCOUNTER — Encounter: Payer: Self-pay | Admitting: Neurology

## 2022-09-22 DIAGNOSIS — L918 Other hypertrophic disorders of the skin: Secondary | ICD-10-CM | POA: Diagnosis not present

## 2022-09-22 DIAGNOSIS — D492 Neoplasm of unspecified behavior of bone, soft tissue, and skin: Secondary | ICD-10-CM | POA: Diagnosis not present

## 2022-09-22 DIAGNOSIS — L400 Psoriasis vulgaris: Secondary | ICD-10-CM | POA: Diagnosis not present

## 2022-09-28 ENCOUNTER — Encounter: Payer: Self-pay | Admitting: Neurology

## 2022-10-01 DIAGNOSIS — D120 Benign neoplasm of cecum: Secondary | ICD-10-CM | POA: Diagnosis not present

## 2022-10-01 DIAGNOSIS — K635 Polyp of colon: Secondary | ICD-10-CM | POA: Diagnosis not present

## 2022-10-01 DIAGNOSIS — Z86010 Personal history of colonic polyps: Secondary | ICD-10-CM | POA: Diagnosis not present

## 2022-10-01 DIAGNOSIS — K621 Rectal polyp: Secondary | ICD-10-CM | POA: Diagnosis not present

## 2022-10-01 DIAGNOSIS — D128 Benign neoplasm of rectum: Secondary | ICD-10-CM | POA: Diagnosis not present

## 2022-10-01 LAB — HM COLONOSCOPY

## 2022-10-05 ENCOUNTER — Encounter: Payer: Self-pay | Admitting: Emergency Medicine

## 2022-10-17 ENCOUNTER — Other Ambulatory Visit: Payer: Self-pay | Admitting: Neurology

## 2022-11-11 ENCOUNTER — Encounter: Payer: Self-pay | Admitting: Neurology

## 2022-11-11 ENCOUNTER — Encounter: Payer: Self-pay | Admitting: Emergency Medicine

## 2022-11-11 ENCOUNTER — Ambulatory Visit (INDEPENDENT_AMBULATORY_CARE_PROVIDER_SITE_OTHER): Payer: Medicare Other | Admitting: Emergency Medicine

## 2022-11-11 ENCOUNTER — Encounter: Payer: Self-pay | Admitting: Family Medicine

## 2022-11-11 ENCOUNTER — Telehealth: Payer: Self-pay

## 2022-11-11 VITALS — BP 132/74 | HR 73 | Temp 97.9°F | Ht 67.0 in | Wt 193.0 lb

## 2022-11-11 DIAGNOSIS — I7300 Raynaud's syndrome without gangrene: Secondary | ICD-10-CM | POA: Diagnosis not present

## 2022-11-11 DIAGNOSIS — G4733 Obstructive sleep apnea (adult) (pediatric): Secondary | ICD-10-CM | POA: Diagnosis not present

## 2022-11-11 DIAGNOSIS — E785 Hyperlipidemia, unspecified: Secondary | ICD-10-CM | POA: Diagnosis not present

## 2022-11-11 DIAGNOSIS — E1159 Type 2 diabetes mellitus with other circulatory complications: Secondary | ICD-10-CM | POA: Diagnosis not present

## 2022-11-11 DIAGNOSIS — K219 Gastro-esophageal reflux disease without esophagitis: Secondary | ICD-10-CM

## 2022-11-11 DIAGNOSIS — H9313 Tinnitus, bilateral: Secondary | ICD-10-CM

## 2022-11-11 DIAGNOSIS — E1169 Type 2 diabetes mellitus with other specified complication: Secondary | ICD-10-CM | POA: Diagnosis not present

## 2022-11-11 DIAGNOSIS — I152 Hypertension secondary to endocrine disorders: Secondary | ICD-10-CM | POA: Diagnosis not present

## 2022-11-11 DIAGNOSIS — I700 Atherosclerosis of aorta: Secondary | ICD-10-CM | POA: Diagnosis not present

## 2022-11-11 DIAGNOSIS — R5382 Chronic fatigue, unspecified: Secondary | ICD-10-CM | POA: Diagnosis not present

## 2022-11-11 LAB — LIPID PANEL
Cholesterol: 166 mg/dL (ref 0–200)
HDL: 39.7 mg/dL (ref >39.00)
LDL Cholesterol: 87 mg/dL (ref 0–99)
NonHDL: 125.82
Total CHOL/HDL Ratio: 4
Triglycerides: 192 mg/dL — ABNORMAL HIGH (ref 0.0–149.0)
VLDL: 38.4 mg/dL (ref 0.0–40.0)

## 2022-11-11 LAB — CBC WITH DIFFERENTIAL/PLATELET
Basophils Absolute: 0 10*3/uL (ref 0.0–0.1)
Basophils Relative: 0.7 % (ref 0.0–3.0)
Eosinophils Absolute: 0.1 10*3/uL (ref 0.0–0.7)
Eosinophils Relative: 0.9 % (ref 0.0–5.0)
HCT: 46.4 % (ref 39.0–52.0)
Hemoglobin: 15.3 g/dL (ref 13.0–17.0)
Lymphocytes Relative: 28.1 % (ref 12.0–46.0)
Lymphs Abs: 1.7 10*3/uL (ref 0.7–4.0)
MCHC: 32.9 g/dL (ref 30.0–36.0)
MCV: 84.4 fl (ref 78.0–100.0)
Monocytes Absolute: 0.5 10*3/uL (ref 0.1–1.0)
Monocytes Relative: 8.3 % (ref 3.0–12.0)
Neutro Abs: 3.8 10*3/uL (ref 1.4–7.7)
Neutrophils Relative %: 62 % (ref 43.0–77.0)
Platelets: 273 10*3/uL (ref 150.0–400.0)
RBC: 5.5 Mil/uL (ref 4.22–5.81)
RDW: 14.4 % (ref 11.5–15.5)
WBC: 6.2 10*3/uL (ref 4.0–10.5)

## 2022-11-11 LAB — COMPREHENSIVE METABOLIC PANEL
ALT: 15 U/L (ref 0–53)
AST: 15 U/L (ref 0–37)
Albumin: 4 g/dL (ref 3.5–5.2)
Alkaline Phosphatase: 72 U/L (ref 39–117)
BUN: 17 mg/dL (ref 6–23)
CO2: 28 mEq/L (ref 19–32)
Calcium: 9.1 mg/dL (ref 8.4–10.5)
Chloride: 103 mEq/L (ref 96–112)
Creatinine, Ser: 0.83 mg/dL (ref 0.40–1.50)
GFR: 89.13 mL/min (ref >60.00)
Glucose, Bld: 133 mg/dL — ABNORMAL HIGH (ref 70–99)
Potassium: 3.9 mEq/L (ref 3.5–5.1)
Sodium: 137 mEq/L (ref 135–145)
Total Bilirubin: 0.4 mg/dL (ref 0.2–1.2)
Total Protein: 7.1 g/dL (ref 6.0–8.3)

## 2022-11-11 LAB — MICROALBUMIN / CREATININE URINE RATIO
Creatinine,U: 88.9 mg/dL
Microalb Creat Ratio: 1 mg/g (ref 0.0–30.0)
Microalb, Ur: 0.9 mg/dL (ref 0.0–1.9)

## 2022-11-11 LAB — HEMOGLOBIN A1C: Hgb A1c MFr Bld: 6.8 % — ABNORMAL HIGH (ref 4.6–6.5)

## 2022-11-11 NOTE — Assessment & Plan Note (Signed)
Stable.  Asymptomatic at present time.

## 2022-11-11 NOTE — Progress Notes (Signed)
Aaron [name] 72 y.o.   Chief Complaint  Patient presents with   Annual Exam    Patient states he is on a CPAP, and he is very tired all the time    HISTORY OF PRESENT ILLNESS: This is a 72 y.o. male here for follow-up of chronic medical conditions. Overall doing fine.  Chronic familiar complaints. No new concerns or any other complaints. Chronic tinnitus.  Has been seen by audiologist and ENT doctor. Also diagnosed with OSA on CPAP treatment  HPI   Prior to Admission medications   Medication Sig Start Date End Date Taking? Authorizing Provider  metFORMIN (GLUCOPHAGE-XR) 500 MG 24 hr tablet TAKE 1 TABLET BY MOUTH EVERY DAY AT 8PM 04/04/22  Yes Vaibhav Fogleman, Ines Bloomer, MD  Multiple Vitamin (MULTI-VITAMIN PO) Take by mouth daily.   Yes [provider]  Omega-3 Fatty Acids (FISH OIL PO) Take 800 mg by mouth daily.   Yes [provider]  triamcinolone (KENALOG) 0.1 % Apply 1 application topically 2 (two) times daily. 11/12/20  Yes Terryn Redner, Ines Bloomer, MD  verapamil (CALAN-SR) 120 MG CR tablet TAKE 1 TABLET(120 MG) BY MOUTH AT BEDTIME 10/19/22  Yes Dohmeier, Asencion Partridge, MD  vitamin B-12 (CYANOCOBALAMIN) 500 MCG tablet Take 500 mcg by mouth daily.   Yes [provider]  Armodafinil 50 MG tablet Take 2 tablets (100 mg total) by mouth daily. Take 1 tablet in the am and may increase to 2 tablet in the morning if needed. Patient not taking: Reported on 06/24/2022 06/10/22   Dohmeier, Asencion Partridge, MD  rosuvastatin (CRESTOR) 10 MG tablet Take 1 tablet (10 mg total) by mouth daily. Patient not taking: Reported on 11/11/2022 02/09/22   [name], MD    Allergies  Allergen Reactions   Erythromycin Itching   Levaquin [Levofloxacin In D5w]     Light sensitivity     Patient Active Problem List   Diagnosis Date Noted   Polyarthritis of multiple sites 05/07/2022   Chronic tension-type headache, not intractable 05/07/2022   OSA on CPAP 05/07/2022   Diverticulosis  02/09/2022   Suspected sleep apnea 02/09/2022   Aortic atherosclerosis ([place]) 02/09/2022   Arm pain, musculoskeletal, left 02/09/2022   Non-restorative sleep 12/23/2021   Sleep related bruxism 12/23/2021   Morning headache 12/23/2021   Chronic fatigue 12/23/2021   Sore of lip 12/02/2021   Tinnitus of both ears 12/02/2021   Right flank pain 08/15/2021   Diabetes ([place]) 03/01/2021   Chronic anxiety 01/08/2021   Raynaud's phenomenon without gangrene 01/08/2021   Other psoriasis 10/01/2020   Coronary artery calcification 06/12/2020   Hypertension associated with diabetes ([place]) 05/29/2019   Gastroesophageal reflux disease without esophagitis 03/13/2017   Dyslipidemia associated with type 2 diabetes mellitus ([place]) 03/13/2017    Past Medical History:  Diagnosis Date   Diabetes mellitus without complication ([place])    GERD (gastroesophageal reflux disease)    Hyperlipidemia    Hypertension    Psoriasis     No past surgical history on file.  Social History   Socioeconomic History   Marital status: Married    Spouse name: Almyra Free   Number of children: 0   Years of education: Not on file   Highest education level: High school graduate  Occupational History   Occupation: Press photographer, retired   Tobacco Use   Smoking status: Former    Types: Cigarettes    Quit date: 1985    Years since quitting: 39.2   Smokeless tobacco: Never  Vaping Use  Vaping Use: Never used  Substance and Sexual Activity   Alcohol use: Yes    Alcohol/week: 7.0 standard drinks of alcohol    Types: 7 Cans of beer per week    Comment: 1 beer with dinner usually daily   Drug use: No   Sexual activity: Yes  Other Topics Concern   Not on file  Social History Narrative   Marital status:  Married.          Children:   none      Lives:      Employment:  Press photographer      Tobacco:  none      Alcohol:  About 7 drinks per week      Drugs:   none      Exercise:      Seatbelt:      Education: [employer].      Lives at  home with wife   Right handed   Caffeine: 2 cups/day   Social Determinants of Health   Financial Resource Strain: Low Risk  (06/24/2022)   Overall Financial Resource Strain (CARDIA)    Difficulty of Paying Living Expenses: Not hard at all  Food Insecurity: No Food Insecurity (06/24/2022)   Hunger Vital Sign    Worried About Running Out of Food in the Last Year: Never true    Ran Out of Food in the Last Year: Never true  Transportation Needs: No Transportation Needs (06/24/2022)   PRAPARE - Hydrologist (Medical): No    Lack of Transportation (Non-Medical): No  Physical Activity: Sufficiently Active (06/24/2022)   Exercise Vital Sign    Days of Exercise per Week: 5 days    Minutes of Exercise per Session: 30 min  Stress: No Stress Concern Present (06/24/2022)   [place]    Feeling of Stress : Not at all  Social Connections: Unknown (06/24/2022)   Social Connection and Isolation Panel [NHANES]    Frequency of Communication with Friends and Family: More than three times a week    Frequency of Social Gatherings with Friends and Family: More than three times a week    Attends Religious Services: More than 4 times per year    Active Member of [employer] or Organizations: Yes    Attends Music therapist: More than 4 times per year    Marital Status: Not on file  Intimate Partner Violence: Not At Risk (06/24/2022)   Humiliation, Afraid, Rape, and Kick questionnaire    Fear of Current or Ex-Partner: No    Emotionally Abused: No    Physically Abused: No    Sexually Abused: No    Family History  Problem Relation Age of Onset   Cancer Mother        breast ca spread to bone   Cancer Father 44       brain cancer   Migraines Neg Hx    Headache Neg Hx      Review of Systems  Constitutional:  Positive for malaise/fatigue. Negative for chills and fever.  HENT:  Positive for  tinnitus (Chronic). Negative for congestion and sore throat.   Respiratory: Negative.  Negative for cough and shortness of breath.   Cardiovascular: Negative.  Negative for chest pain and palpitations.  Gastrointestinal:  Negative for nausea and vomiting.  Genitourinary: Negative.  Negative for dysuria and hematuria.  Musculoskeletal: Negative.   Skin: Negative.  Negative for rash.  Intermittent cold hands and feet  Neurological:  Positive for headaches.  All other systems reviewed and are negative.   Vitals:   11/11/22 1002  BP: 132/74  Pulse: 73  Temp: 97.9 F (36.6 C)  SpO2: 98%    Physical Exam Vitals reviewed.  Constitutional:      Appearance: Normal appearance.  HENT:     Head: Normocephalic.     Right Ear: Tympanic membrane, ear canal and external ear normal.     Left Ear: Tympanic membrane, ear canal and external ear normal.     Mouth/Throat:     Mouth: Mucous membranes are moist.     Pharynx: Oropharynx is clear.  Eyes:     Extraocular Movements: Extraocular movements intact.     Conjunctiva/sclera: Conjunctivae normal.     Pupils: Pupils are equal, round, and reactive to light.  Neck:     Vascular: No carotid bruit.  Cardiovascular:     Rate and Rhythm: Normal rate and regular rhythm.     Pulses: Normal pulses.     Heart sounds: Normal heart sounds.  Pulmonary:     Effort: Pulmonary effort is normal.     Breath sounds: Normal breath sounds.  Abdominal:     Palpations: Abdomen is soft.     Tenderness: There is no abdominal tenderness.  Musculoskeletal:        General: Normal range of motion.     Cervical back: No tenderness.     Right lower leg: No edema.     Left lower leg: No edema.     Comments: Feet: Warm to touch.  No erythema or bruising.  Neurovascularly intact with good peripheral pulses and good capillary refill.  Sensation intact.  No abnormal findings.  Lymphadenopathy:     Cervical: No cervical adenopathy.  Skin:    General: Skin is  warm and dry.  Neurological:     General: No focal deficit present.     Mental Status: He is alert and oriented to person, place, and time.  Psychiatric:        Mood and Affect: Mood normal.        Behavior: Behavior normal.      ASSESSMENT & PLAN: A total of 35 minutes was spent with the patient and counseling/coordination of care regarding preparing for this visit, review of most recent office visit notes, review of multiple chronic medical conditions and their management, review of all medications, review of most recent blood work results, education on nutrition, cardiovascular risks associated with hypertension, diabetes, dyslipidemia, prognosis, documentation, need for follow-up.  Problem List Items Addressed This Visit       Cardiovascular and Mediastinum   Hypertension associated with diabetes ([place]) - Primary    Well-controlled hypertension. Continue verapamil 120 mg daily Well-controlled diabetes. Continue metformin 500 mg daily Blood work done today Cardiovascular risks associated with diabetes and hypertension discussed Diet and nutrition discussed.      Relevant Orders   CBC with Differential/Platelet   Comprehensive metabolic panel   Microalbumin / creatinine urine ratio   Hemoglobin A1c   Aortic atherosclerosis (HCC)    Stable.  Continue rosuvastatin 10 mg daily.        Respiratory   OSA on CPAP    Stable.  Tolerating it well.        Digestive   Gastroesophageal reflux disease without esophagitis    Stable.  Asymptomatic at present time.        Endocrine   Dyslipidemia associated with  type 2 diabetes mellitus (HCC)    Chronic stable condition.  Diet and nutrition discussed. Recommend to continue rosuvastatin 10 mg daily. The 10-year ASCVD risk score (Arnett DK, et al., 2019) is: 33.4%   Values used to calculate the score:     Age: 30 years     Sex: Male     Is Non-Hispanic African American: No     Diabetic: Yes     Tobacco smoker: No      Systolic Blood Pressure: [id number] mmHg     Is BP treated: Yes     HDL Cholesterol: 43.8 mg/dL     Total Cholesterol: 153 mg/dL       Relevant Orders   CBC with Differential/Platelet   Comprehensive metabolic panel   Lipid panel     Other   Raynaud's phenomenon without gangrene    Intermittent symptoms are not affecting quality of life      Tinnitus of both ears    Chronic and stable.  Continues to have intermittent symptoms Already evaluated by audiologist and ENT doctors No specific treatment.      Chronic fatigue    Still on and off.  Chronic.  Blood work done today.      Patient Instructions  Health Maintenance After Age 70 After age 31, you are at a higher risk for certain long-term diseases and infections as well as injuries from falls. Falls are a major cause of broken bones and head injuries in people who are older than age 35. Getting regular preventive care can help to keep you healthy and well. Preventive care includes getting regular testing and making lifestyle changes as recommended by your health care provider. Talk with your health care provider about: Which screenings and tests you should have. A screening is a test that checks for a disease when you have no symptoms. A diet and exercise plan that is right for you. What should I know about screenings and tests to prevent falls? Screening and testing are the best ways to find a health problem early. Early diagnosis and treatment give you the best chance of managing medical conditions that are common after age 59. Certain conditions and lifestyle choices may make you more likely to have a fall. Your health care provider may recommend: Regular vision checks. Poor vision and conditions such as cataracts can make you more likely to have a fall. If you wear glasses, make sure to get your prescription updated if your vision changes. Medicine review. Work with your health care provider to regularly review all of the medicines  you are taking, including over-the-counter medicines. Ask your health care provider about any side effects that may make you more likely to have a fall. Tell your health care provider if any medicines that you take make you feel dizzy or sleepy. Strength and balance checks. Your health care provider may recommend certain tests to check your strength and balance while standing, walking, or changing positions. Foot health exam. Foot pain and numbness, as well as not wearing proper footwear, can make you more likely to have a fall. Screenings, including: Osteoporosis screening. Osteoporosis is a condition that causes the bones to get weaker and break more easily. Blood pressure screening. Blood pressure changes and medicines to control blood pressure can make you feel dizzy. Depression screening. You may be more likely to have a fall if you have a fear of falling, feel depressed, or feel unable to do activities that you used to do. Alcohol use  screening. Using too much alcohol can affect your balance and may make you more likely to have a fall. Follow these instructions at home: Lifestyle Do not drink alcohol if: Your health care provider tells you not to drink. If you drink alcohol: Limit how much you have to: 0-1 drink a day for women. 0-2 drinks a day for men. Know how much alcohol is in your drink. In the [place] one drink equals one 12 oz bottle of beer (355 mL), one 5 oz glass of wine (148 mL), or one 1 oz glass of hard liquor (44 mL). Do not use any products that contain nicotine or tobacco. These products include cigarettes, chewing tobacco, and vaping devices, such as e-cigarettes. If you need help quitting, ask your health care provider. Activity  Follow a regular exercise program to stay fit. This will help you maintain your balance. Ask your health care provider what types of exercise are appropriate for you. If you need a cane or walker, use it as recommended by your health care  provider. Wear supportive shoes that have nonskid soles. Safety  Remove any tripping hazards, such as rugs, cords, and clutter. Install safety equipment such as grab bars in bathrooms and safety rails on stairs. Keep rooms and walkways well-lit. General instructions Talk with your health care provider about your risks for falling. Tell your health care provider if: You fall. Be sure to tell your health care provider about all falls, even ones that seem minor. You feel dizzy, tiredness (fatigue), or off-balance. Take over-the-counter and prescription medicines only as told by your health care provider. These include supplements. Eat a healthy diet and maintain a healthy weight. A healthy diet includes low-fat dairy products, low-fat (lean) meats, and fiber from whole grains, beans, and lots of fruits and vegetables. Stay current with your vaccines. Schedule regular health, dental, and eye exams. Summary Having a healthy lifestyle and getting preventive care can help to protect your health and wellness after age 4. Screening and testing are the best way to find a health problem early and help you avoid having a fall. Early diagnosis and treatment give you the best chance for managing medical conditions that are more common for people who are older than age 68. Falls are a major cause of broken bones and head injuries in people who are older than age 86. Take precautions to prevent a fall at home. Work with your health care provider to learn what changes you can make to improve your health and wellness and to prevent falls. This information is not intended to replace advice given to you by your health care provider. Make sure you discuss any questions you have with your health care provider. Document Revised: 01/06/2021 Document Reviewed: 01/06/2021 Elsevier Patient Education  [place], MD [facility]

## 2022-11-11 NOTE — Assessment & Plan Note (Signed)
Intermittent symptoms are not affecting quality of life

## 2022-11-11 NOTE — Assessment & Plan Note (Signed)
Still on and off.  Chronic.  Blood work done today.

## 2022-11-11 NOTE — Assessment & Plan Note (Addendum)
Chronic and stable.  Continues to have intermittent symptoms Already evaluated by audiologist and ENT doctors No specific treatment.

## 2022-11-11 NOTE — Telephone Encounter (Signed)
Pt called and stated that he is having buzzing noises inhis ears. Pt states that the buzzing has been distracting for him. Pt states that he has a hard time going to sleep due to the buzzing in his ears and he is having Fatigue throughout the day due to everything going on with his tinnitus . Pt states that he went to Ear,Nose, and Throat doctor about this situation and the doctor said that everything was fine and he has mild hearing loss in his right ear,but other that that he passed the rest of their test. Told pt that NP Amy, [place] will be notified and he will get a call back about this situation. Pt verbalized understanding.

## 2022-11-11 NOTE — Telephone Encounter (Signed)
Called pt and left a message to call us back about his message about his tinnitus so we can best treat him.

## 2022-11-11 NOTE — Patient Instructions (Signed)
Health Maintenance After Age 70 After age 70, you are at a higher risk for certain long-term diseases and infections as well as injuries from falls. Falls are a major cause of broken bones and head injuries in people who are older than age 70. Getting regular preventive care can help to keep you healthy and well. Preventive care includes getting regular testing and making lifestyle changes as recommended by your health care provider. Talk with your health care provider about: Which screenings and tests you should have. A screening is a test that checks for a disease when you have no symptoms. A diet and exercise plan that is right for you. What should I know about screenings and tests to prevent falls? Screening and testing are the best ways to find a health problem early. Early diagnosis and treatment give you the best chance of managing medical conditions that are common after age 70. Certain conditions and lifestyle choices may make you more likely to have a fall. Your health care provider may recommend: Regular vision checks. Poor vision and conditions such as cataracts can make you more likely to have a fall. If you wear glasses, make sure to get your prescription updated if your vision changes. Medicine review. Work with your health care provider to regularly review all of the medicines you are taking, including over-the-counter medicines. Ask your health care provider about any side effects that may make you more likely to have a fall. Tell your health care provider if any medicines that you take make you feel dizzy or sleepy. Strength and balance checks. Your health care provider may recommend certain tests to check your strength and balance while standing, walking, or changing positions. Foot health exam. Foot pain and numbness, as well as not wearing proper footwear, can make you more likely to have a fall. Screenings, including: Osteoporosis screening. Osteoporosis is a condition that causes  the bones to get weaker and break more easily. Blood pressure screening. Blood pressure changes and medicines to control blood pressure can make you feel dizzy. Depression screening. You may be more likely to have a fall if you have a fear of falling, feel depressed, or feel unable to do activities that you used to do. Alcohol use screening. Using too much alcohol can affect your balance and may make you more likely to have a fall. Follow these instructions at home: Lifestyle Do not drink alcohol if: Your health care provider tells you not to drink. If you drink alcohol: Limit how much you have to: 0-1 drink a day for women. 0-2 drinks a day for men. Know how much alcohol is in your drink. In the [place] one drink equals one 12 oz bottle of beer (355 mL), one 5 oz glass of wine (148 mL), or one 1 oz glass of hard liquor (44 mL). Do not use any products that contain nicotine or tobacco. These products include cigarettes, chewing tobacco, and vaping devices, such as e-cigarettes. If you need help quitting, ask your health care provider. Activity  Follow a regular exercise program to stay fit. This will help you maintain your balance. Ask your health care provider what types of exercise are appropriate for you. If you need a cane or walker, use it as recommended by your health care provider. Wear supportive shoes that have nonskid soles. Safety  Remove any tripping hazards, such as rugs, cords, and clutter. Install safety equipment such as grab bars in bathrooms and safety rails on stairs. Keep rooms and walkways   well-lit. General instructions Talk with your health care provider about your risks for falling. Tell your health care provider if: You fall. Be sure to tell your health care provider about all falls, even ones that seem minor. You feel dizzy, tiredness (fatigue), or off-balance. Take over-the-counter and prescription medicines only as told by your health care provider. These include  supplements. Eat a healthy diet and maintain a healthy weight. A healthy diet includes low-fat dairy products, low-fat (lean) meats, and fiber from whole grains, beans, and lots of fruits and vegetables. Stay current with your vaccines. Schedule regular health, dental, and eye exams. Summary Having a healthy lifestyle and getting preventive care can help to protect your health and wellness after age 70. Screening and testing are the best way to find a health problem early and help you avoid having a fall. Early diagnosis and treatment give you the best chance for managing medical conditions that are more common for people who are older than age 70. Falls are a major cause of broken bones and head injuries in people who are older than age 70. Take precautions to prevent a fall at home. Work with your health care provider to learn what changes you can make to improve your health and wellness and to prevent falls. This information is not intended to replace advice given to you by your health care provider. Make sure you discuss any questions you have with your health care provider. Document Revised: 01/06/2021 Document Reviewed: 01/06/2021 Elsevier Patient Education  2023 Elsevier Inc.  

## 2022-11-11 NOTE — Assessment & Plan Note (Signed)
Stable.  Tolerating it well.

## 2022-11-11 NOTE — Assessment & Plan Note (Signed)
Chronic stable condition.  Diet and nutrition discussed. Recommend to continue rosuvastatin 10 mg daily. The 10-year ASCVD risk score (Arnett DK, et al., 2019) is: 33.4%   Values used to calculate the score:     Age: 72 years     Sex: Male     Is Non-Hispanic African American: No     Diabetic: Yes     Tobacco smoker: No     Systolic Blood Pressure: [id number] mmHg     Is BP treated: Yes     HDL Cholesterol: 43.8 mg/dL     Total Cholesterol: 153 mg/dL

## 2022-11-11 NOTE — Assessment & Plan Note (Signed)
Well-controlled hypertension. Continue verapamil 120 mg daily Well-controlled diabetes. Continue metformin 500 mg daily Blood work done today Cardiovascular risks associated with diabetes and hypertension discussed Diet and nutrition discussed.

## 2022-11-11 NOTE — Assessment & Plan Note (Signed)
Stable.  Continue rosuvastatin 10 mg daily. °

## 2022-11-12 ENCOUNTER — Encounter: Payer: Self-pay | Admitting: Emergency Medicine

## 2022-11-12 DIAGNOSIS — H9313 Tinnitus, bilateral: Secondary | ICD-10-CM

## 2022-11-14 ENCOUNTER — Other Ambulatory Visit: Payer: Self-pay | Admitting: Emergency Medicine

## 2022-11-14 DIAGNOSIS — E1165 Type 2 diabetes mellitus with hyperglycemia: Secondary | ICD-10-CM

## 2022-11-16 ENCOUNTER — Encounter (HOSPITAL_COMMUNITY): Payer: Self-pay

## 2022-11-16 ENCOUNTER — Other Ambulatory Visit: Payer: Self-pay

## 2022-11-16 ENCOUNTER — Emergency Department (HOSPITAL_COMMUNITY)
Admission: EM | Admit: 2022-11-16 | Discharge: 2022-11-16 | Disposition: A | Discharge status: Home / Self Care | Payer: Medicare Other | Attending: Emergency Medicine | Admitting: Emergency Medicine

## 2022-11-16 ENCOUNTER — Emergency Department (HOSPITAL_COMMUNITY): Payer: Medicare Other

## 2022-11-16 ENCOUNTER — Ambulatory Visit
Admission: EM | Admit: 2022-11-16 | Discharge: 2022-11-16 | Disposition: A | Discharge status: Home / Self Care | Payer: Medicare Other | Attending: Nurse Practitioner | Admitting: Nurse Practitioner

## 2022-11-16 DIAGNOSIS — R079 Chest pain, unspecified: Secondary | ICD-10-CM | POA: Diagnosis not present

## 2022-11-16 DIAGNOSIS — Z79899 Other long term (current) drug therapy: Secondary | ICD-10-CM | POA: Diagnosis not present

## 2022-11-16 DIAGNOSIS — I10 Essential (primary) hypertension: Secondary | ICD-10-CM | POA: Diagnosis not present

## 2022-11-16 DIAGNOSIS — Z7984 Long term (current) use of oral hypoglycemic drugs: Secondary | ICD-10-CM | POA: Diagnosis not present

## 2022-11-16 DIAGNOSIS — R0789 Other chest pain: Secondary | ICD-10-CM | POA: Diagnosis not present

## 2022-11-16 DIAGNOSIS — J9811 Atelectasis: Secondary | ICD-10-CM | POA: Diagnosis not present

## 2022-11-16 DIAGNOSIS — E119 Type 2 diabetes mellitus without complications: Secondary | ICD-10-CM | POA: Insufficient documentation

## 2022-11-16 LAB — CBC
HCT: 48.4 % (ref 39.0–52.0)
Hemoglobin: 15.6 g/dL (ref 13.0–17.0)
MCH: 27.8 pg (ref 26.0–34.0)
MCHC: 32.2 g/dL (ref 30.0–36.0)
MCV: 86.1 fL (ref 80.0–100.0)
Platelets: 268 10*3/uL (ref 150–400)
RBC: 5.62 MIL/uL (ref 4.22–5.81)
RDW: 13.8 % (ref 11.5–15.5)
WBC: 7.8 10*3/uL (ref 4.0–10.5)
nRBC: 0 % (ref 0.0–0.2)

## 2022-11-16 LAB — BASIC METABOLIC PANEL
Anion gap: 7 (ref 5–15)
BUN: 12 mg/dL (ref 8–23)
CO2: 25 mmol/L (ref 22–32)
Calcium: 9 mg/dL (ref 8.9–10.3)
Chloride: 104 mmol/L (ref 98–111)
Creatinine, Ser: 0.82 mg/dL (ref 0.61–1.24)
GFR, Estimated: >60 mL/min (ref >60)
Glucose, Bld: 106 mg/dL — ABNORMAL HIGH (ref 70–99)
Potassium: 3.9 mmol/L (ref 3.5–5.1)
Sodium: 136 mmol/L (ref 135–145)

## 2022-11-16 LAB — TROPONIN I (HIGH SENSITIVITY)
Troponin I (High Sensitivity): 3 ng/L (ref <18)
Troponin I (High Sensitivity): 4 ng/L (ref <18)

## 2022-11-16 MED ORDER — ASPIRIN 81 MG PO CHEW
324.0000 mg | CHEWABLE_TABLET | Freq: Once | ORAL | Status: AC
  Administered 2022-11-16: 324 mg via ORAL
  Filled 2022-11-16: qty 4

## 2022-11-16 NOTE — ED Provider Notes (Signed)
[facility]    CSN: [id number] Arrival date & time: 11/16/22  [place]      History   Chief Complaint Chief Complaint  Patient presents with   Chest Pain    HPI Aaron [name] is a 72 y.o. male presents for evaluation of chest pain.  Patient reports today he awoke with midsternal chest pain that does not radiate.  It is an aching type pain.  He denies any shortness of breath, palpitations, dizziness, syncope, nausea/vomiting.  Cannot identify any aggravating or alleviating factors.  He does have a past medical history of diabetes, hypertension and possibly hyperlipidemia.  He also has OSA and does use a CPAP.  Denies any family history of heart issues.  States he had a stress test several years ago that was normal.  He is a previous smoker.  He did go for a walk while he had the chest pain and states it did not make it worse and had no shortness of breath during that time.  No other concerns at this time.   Chest Pain   Past Medical History:  Diagnosis Date   Diabetes mellitus without complication (HCC)    GERD (gastroesophageal reflux disease)    Hyperlipidemia    Hypertension    Psoriasis     Patient Active Problem List   Diagnosis Date Noted   Polyarthritis of multiple sites 05/07/2022   Chronic tension-type headache, not intractable 05/07/2022   OSA on CPAP 05/07/2022   Diverticulosis 02/09/2022   Suspected sleep apnea 02/09/2022   Aortic atherosclerosis ([place]) 02/09/2022   Non-restorative sleep 12/23/2021   Sleep related bruxism 12/23/2021   Morning headache 12/23/2021   Chronic fatigue 12/23/2021   Sore of lip 12/02/2021   Tinnitus of both ears 12/02/2021   Right flank pain 08/15/2021   Diabetes ([place]) 03/01/2021   Chronic anxiety 01/08/2021   Raynaud's phenomenon without gangrene 01/08/2021   Other psoriasis 10/01/2020   Coronary artery calcification 06/12/2020   Hypertension associated with diabetes ([place]) 05/29/2019   Gastroesophageal reflux disease  without esophagitis 03/13/2017   Dyslipidemia associated with type 2 diabetes mellitus ([place]) 03/13/2017    History reviewed. No pertinent surgical history.     Home Medications    Prior to Admission medications   Medication Sig Start Date End Date Taking? Authorizing Provider  Armodafinil 50 MG tablet Take 2 tablets (100 mg total) by mouth daily. Take 1 tablet in the am and may increase to 2 tablet in the morning if needed. Patient not taking: Reported on 06/24/2022 06/10/22   Dohmeier, Asencion Partridge, MD  metFORMIN (GLUCOPHAGE-XR) 500 MG 24 hr tablet TAKE 1 TABLET BY MOUTH DAILY AT 8PM 11/15/22   [name], MD  Multiple Vitamin (MULTI-VITAMIN PO) Take by mouth daily.    [provider]  Omega-3 Fatty Acids (FISH OIL PO) Take 800 mg by mouth daily.    [provider]  rosuvastatin (CRESTOR) 10 MG tablet Take 1 tablet (10 mg total) by mouth daily. Patient not taking: Reported on 11/11/2022 02/09/22   [name], MD  triamcinolone (KENALOG) 0.1 % Apply 1 application topically 2 (two) times daily. 11/12/20   [name], MD  verapamil (CALAN-SR) 120 MG CR tablet TAKE 1 TABLET(120 MG) BY MOUTH AT BEDTIME 10/19/22   Dohmeier, Asencion Partridge, MD  vitamin B-12 (CYANOCOBALAMIN) 500 MCG tablet Take 500 mcg by mouth daily.    [provider]    Family History Family History  Problem Relation Age of Onset  Cancer Mother        breast ca spread to bone   Cancer Father 79       brain cancer   Migraines Neg Hx    Headache Neg Hx     Social History Social History   Tobacco Use   Smoking status: Former    Types: Cigarettes    Quit date: 1985    Years since quitting: 39.2   Smokeless tobacco: Never  Vaping Use   Vaping Use: Never used  Substance Use Topics   Alcohol use: Yes    Alcohol/week: 7.0 standard drinks of alcohol    Types: 7 Cans of beer per week    Comment: 1 beer with dinner usually daily   Drug use: No     Allergies    Erythromycin and Levaquin [levofloxacin in d5w]   Review of Systems Review of Systems  Cardiovascular:  Positive for chest pain.     Physical Exam Triage Vital Signs ED Triage Vitals  Enc Vitals Group     BP 11/16/22 1657 (!) 141/85     Pulse Rate 11/16/22 1656 82     Resp 11/16/22 1656 18     Temp 11/16/22 1656 98.1 F (36.7 C)     Temp Source 11/16/22 1656 Oral     SpO2 11/16/22 1656 96 %     Weight --      Height --      Head Circumference --      Peak Flow --      Pain Score 11/16/22 1655 7     Pain Loc --      Pain Edu? --      Excl. in [place]? --    No data found.  Updated Vital Signs BP (!) 141/85   Pulse 82   Temp 98.1 F (36.7 C) (Oral)   Resp 18   SpO2 96%   Visual Acuity Right Eye Distance:   Left Eye Distance:   Bilateral Distance:    Right Eye Near:   Left Eye Near:    Bilateral Near:     Physical Exam Vitals and nursing note reviewed.  Constitutional:      Appearance: Normal appearance.  HENT:     Head: Normocephalic and atraumatic.  Eyes:     Pupils: Pupils are equal, round, and reactive to light.  Cardiovascular:     Rate and Rhythm: Normal rate and regular rhythm.     Heart sounds: Normal heart sounds.  Pulmonary:     Effort: Pulmonary effort is normal.     Breath sounds: Normal breath sounds.  Chest:     Chest wall: No tenderness.  Skin:    General: Skin is warm and dry.  Neurological:     General: No focal deficit present.     Mental Status: He is alert and oriented to person, place, and time.  Psychiatric:        Mood and Affect: Mood normal.        Behavior: Behavior normal.      UC Treatments / Results  Labs (all labs ordered are listed, but only abnormal results are displayed) Labs Reviewed - No data to display  EKG   Radiology No results found.  Procedures ED EKG  Date/Time: 11/16/2022 5:14 PM  Performed by: Melynda Ripple, NP Authorized by: Melynda Ripple, NP   Previous ECG:    Previous ECG:  Compared  to current Rate:    ECG rate assessment: normal  Rhythm:    Rhythm: sinus rhythm   Ectopy:    Ectopy: none   ST segments:    ST segments:  Normal Comments:     Sinus rhythm with no acute ST-T wave changes or ectopy.  Can not rule out anterior infarct  (including critical care time)  Medications Ordered in UC Medications - No data to display  Initial Impression / Assessment and Plan / UC Course  I have reviewed the triage vital signs and the nursing notes.  Pertinent labs & imaging results that were available during my care of the patient were reviewed by me and considered in my medical decision making (see chart for details).     Reviewed exam and symptoms with patient.  Discussed limitations and abilities of urgent care Discussed given his symptoms, medical history, age I advised he go to the emergency room for further evaluation and treatment of his symptoms.  Discussed I am unable to rule out life-threatening condition such as MI or PE in the setting.  Patient is hemodynamically stable at time of discharge.  Declined EMS transfer.  He will go POV to the emergency room.  He was instructed to pull over and call 911 for any worsening symptoms that occur in transit and he verbalized understanding. Final Clinical Impressions(s) / UC Diagnoses   Final diagnoses:  Chest pain, unspecified type     Discharge Instructions      Please go to the ER for further evaluation of your chest pain   ED Prescriptions   None    PDMP not reviewed this encounter.   Melynda Ripple, NP 11/16/22 [phone]

## 2022-11-16 NOTE — Discharge Instructions (Addendum)
Please go to the ER for further evaluation of your chest pain.

## 2022-11-16 NOTE — ED Provider Triage Note (Signed)
Emergency Medicine Provider Triage Evaluation Note  Aaron [name] , a 72 y.o. male  was evaluated in triage.  He has a history of hypertension, hyperlipidemia, diabetes.  No history of heart disease.  Cardiac workup 2 years ago which was negative per his report.  Pt complains of chest pain.  Patient has pain along the right sternal border.  It started this morning.  Not changed with eating or drinking.  He was able to walk this morning and this did not make his symptoms worse.  Denies any recent heavy lifting or other injuries.  No shortness of breath.  No diaphoresis, vomiting.  Seen at urgent care, sent for further evaluation.  Denies current tobacco use.  Review of Systems  Positive: Chest pain Negative: Shortness of breath  Physical Exam  BP (!) 150/79 (BP Location: Right Arm)   Pulse 72   Temp 98.2 F (36.8 C) (Oral)   Resp 16   Ht 5\' 6"  (1.676 m)   Wt 86.2 kg   SpO2 99%   BMI 30.67 kg/m  Gen:   Awake, no distress   Resp:  Normal effort  MSK:   Moves extremities without difficulty  Other:  There is an area over the right sternal border that is tender and reproduces the patient's pain.  Heart normal rate and regular rhythm.  Medical Decision Making  Medically screening exam initiated at 6:27 PM.  Appropriate orders placed.  [name] was informed that the remainder of the evaluation will be completed by another provider, this initial triage assessment does not replace that evaluation, and the importance of remaining in the ED until their evaluation is complete.     Carlisle Cater, PA-C 11/16/22 [id number]

## 2022-11-16 NOTE — ED Triage Notes (Signed)
States he went for a walk for 15 mins and was not SOB.

## 2022-11-16 NOTE — ED Triage Notes (Signed)
Pt presents with c/o chest pain that has worsened since this morning. Expresses the chest pain is constant and is on the left side of chest.   States he has ongoing headaches. Has spoken to PCP and is being referred to neurologist.

## 2022-11-16 NOTE — Discharge Instructions (Addendum)
Return for any problem.  ?

## 2022-11-16 NOTE — ED Triage Notes (Signed)
Sent by UC for intermittent chest pain that is non radiating. Denies nausea, SOB, dizziness. Denies other sx. UC sent pt for abnormal EKG

## 2022-11-16 NOTE — ED Provider Notes (Signed)
[place] EMERGENCY DEPARTMENT AT [facility] Provider Note   CSN: [id number] Arrival date & time: 11/16/22  1733     History  Chief Complaint  Patient presents with   Chest Pain    Aaron [name] is a 72 y.o. male.  72 year old male with prior medical history as detailed below presents for evaluation.  Patient with known history of hypertension, hyperlipidemia, diabetes.  Patient complains of right-sided chest discomfort.  Patient's pain has been present all day.  He reports that it was present when he woke up.  He denies any significant change or alteration in his symptoms over the course of the day.  He denies associated nausea, vomiting, shortness of breath, diaphoresis, other complaint.  He denies prior episodes of chest pain.  He denies prior known cardiac disease history.  He reports cardiac workup approximately 2 years ago with normal stress test reported.  Patient was seen this afternoon in urgent care and referred to the ED for evaluation of his reported chest pain.  Patient did not take anything for his pain throughout the day.  He declines pain medication at time of evaluation.    The history is provided by the patient and medical records.       Home Medications Prior to Admission medications   Medication Sig Start Date End Date Taking? Authorizing Provider  Armodafinil 50 MG tablet Take 2 tablets (100 mg total) by mouth daily. Take 1 tablet in the am and may increase to 2 tablet in the morning if needed. Patient not taking: Reported on 06/24/2022 06/10/22   Dohmeier, Asencion Partridge, MD  metFORMIN (GLUCOPHAGE-XR) 500 MG 24 hr tablet TAKE 1 TABLET BY MOUTH DAILY AT 8PM 11/15/22   [name], MD  Multiple Vitamin (MULTI-VITAMIN PO) Take by mouth daily.    [provider]  Omega-3 Fatty Acids (FISH OIL PO) Take 800 mg by mouth daily.    [provider]  rosuvastatin (CRESTOR) 10 MG tablet Take 1 tablet (10 mg total) by mouth  daily. Patient not taking: Reported on 11/11/2022 02/09/22   [name], MD  triamcinolone (KENALOG) 0.1 % Apply 1 application topically 2 (two) times daily. 11/12/20   [name], MD  verapamil (CALAN-SR) 120 MG CR tablet TAKE 1 TABLET(120 MG) BY MOUTH AT BEDTIME 10/19/22   Dohmeier, Asencion Partridge, MD  vitamin B-12 (CYANOCOBALAMIN) 500 MCG tablet Take 500 mcg by mouth daily.    [provider]      Allergies    Erythromycin and Levaquin [levofloxacin in d5w]    Review of Systems   Review of Systems  All other systems reviewed and are negative.   Physical Exam Updated Vital Signs BP 131/86   Pulse 73   Temp 98.2 F (36.8 C) (Oral)   Resp 18   Ht 5\' 6"  (1.676 m)   Wt 86.2 kg   SpO2 97%   BMI 30.67 kg/m  Physical Exam Vitals and nursing note reviewed.  Constitutional:      General: He is not in acute distress.    Appearance: Normal appearance. He is well-developed.  HENT:     Head: Normocephalic and atraumatic.  Eyes:     Conjunctiva/sclera: Conjunctivae normal.     Pupils: Pupils are equal, round, and reactive to light.  Cardiovascular:     Rate and Rhythm: Normal rate and regular rhythm.     Heart sounds: Normal heart sounds.  Pulmonary:     Effort: Pulmonary effort is normal. No  respiratory distress.     Breath sounds: Normal breath sounds.  Chest:     Comments: Localizes pain to the right costochondral margin.  No appreciable tenderness with palpation. Abdominal:     General: There is no distension.     Palpations: Abdomen is soft.     Tenderness: There is no abdominal tenderness.  Musculoskeletal:        General: No deformity. Normal range of motion.     Cervical back: Normal range of motion and neck supple.  Skin:    General: Skin is warm and dry.  Neurological:     General: No focal deficit present.     Mental Status: He is alert and oriented to person, place, and time.     ED Results / Procedures / Treatments   Labs (all labs  ordered are listed, but only abnormal results are displayed) Labs Reviewed  BASIC METABOLIC PANEL - Abnormal; Notable for the following components:      Result Value   Glucose, Bld 106 (*)    All other components within normal limits  CBC  TROPONIN I (HIGH SENSITIVITY)  TROPONIN I (HIGH SENSITIVITY)    EKG EKG Interpretation  Date/Time:  Monday November 16 2022 19:20:45 EDT Ventricular Rate:  95 PR Interval:  185 QRS Duration: 101 QT Interval:  347 QTC Calculation: 437 R Axis:   39 Text Interpretation: Sinus rhythm Confirmed by Dene Gentry [phone]) on 11/16/2022 7:44:36 PM  Radiology DG Chest 2 View  Result Date: 11/16/2022 CLINICAL DATA:  Mid to right-sided chest pain. EXAM: CHEST - 2 VIEW COMPARISON:  02/23/2020 FINDINGS: Lower lung volumes.The cardiomediastinal contours are normal. Subsegmental atelectasis in the lingula and right lung base. Pulmonary vasculature is normal. No consolidation, pleural effusion, or pneumothorax. Mild thoracic spondylosis with spurring. No acute osseous abnormalities are seen. IMPRESSION: Lower lung volumes with subsegmental atelectasis in the lung bases. Electronically Signed   By: Keith Rake M.D.   On: 11/16/2022 18:51    Procedures Procedures    Medications Ordered in ED Medications  aspirin chewable tablet 324 mg (324 mg Oral Given 11/16/22 1916)    ED Course/ Medical Decision Making/ A&P                             Medical Decision Making Amount and/or Complexity of Data Reviewed Labs: ordered. Radiology: ordered.  Risk OTC drugs.    Medical Screen Complete  This patient presented to the ED with complaint of chest pain.  This complaint involves an extensive number of treatment options. The initial differential diagnosis includes, but is not limited to, musculoskeletal chest pain, ACS, pneumonia, pneumothorax, etc.  This presentation is: Acute, Self-Limited, Previously Undiagnosed, Uncertain Prognosis, Complicated,  Systemic Symptoms, and Threat to Life/Bodily Function  Patient is presenting with complaint of atypical chest discomfort.  Describe symptoms are not consistent with cardiac ischemia.  Patient reports normal stress test approximately 2 years ago.  Patient is EKG is without acute ischemia.  Troponin x 2 is 3, 4.  Other screening labs obtained are without significant abnormality.  Patient is reassured by workup.    Patient plans on following up with his cardiologist and his PCP tomorrow.  Importance of close follow-up is repeatedly stressed.  Strict return precautions given and understood.  Additional history obtained:  External records from outside sources obtained and reviewed including prior ED visits and prior Inpatient records.    Lab Tests:  I ordered and  personally interpreted labs.  The pertinent results include: CBC, BMP, troponin x 2   Imaging Studies ordered:  I ordered imaging studies including chest x-ray I independently visualized and interpreted obtained imaging which showed NAD I agree with the radiologist interpretation.   Cardiac Monitoring:  The patient was maintained on a cardiac monitor.  I personally viewed and interpreted the cardiac monitor which showed an underlying rhythm of: NSR   Medicines ordered:  I ordered medication including asa  for antiplatelet effect  Reevaluation of the patient after these medicines showed that the patient: improved    Problem List / ED Course:  Atypical chest pain   Reevaluation:  After the interventions noted above, I reevaluated the patient and found that they have: improved  Disposition:  After consideration of the diagnostic results and the patients response to treatment, I feel that the patent would benefit from admission.          Final Clinical Impression(s) / ED Diagnoses Final diagnoses:  Atypical chest pain    Rx / DC Orders ED Discharge Orders     None         Valarie Merino, MD 11/16/22 2313

## 2022-11-20 ENCOUNTER — Encounter: Payer: Self-pay | Admitting: Cardiology

## 2022-11-20 ENCOUNTER — Ambulatory Visit: Payer: Medicare Other | Attending: Cardiology | Admitting: Cardiology

## 2022-11-20 VITALS — BP 142/83 | HR 71 | Ht 66.0 in | Wt 189.0 lb

## 2022-11-20 DIAGNOSIS — I2510 Atherosclerotic heart disease of native coronary artery without angina pectoris: Secondary | ICD-10-CM | POA: Diagnosis not present

## 2022-11-20 DIAGNOSIS — R0789 Other chest pain: Secondary | ICD-10-CM | POA: Insufficient documentation

## 2022-11-20 DIAGNOSIS — I2584 Coronary atherosclerosis due to calcified coronary lesion: Secondary | ICD-10-CM | POA: Diagnosis not present

## 2022-11-20 MED ORDER — ROSUVASTATIN CALCIUM 10 MG PO TABS: 10.0000 mg | ORAL_TABLET | Freq: Every day | ORAL | 3 refills | Status: AC

## 2022-11-20 NOTE — Progress Notes (Signed)
Cardiology Office Note   Date:  11/20/2022   ID:  Aaron, [name] Jan 15, 1951, MRN [id number]  PCP:  [name], MD  Cardiologist:   None   Chief Complaint  Patient presents with   Shortness of Breath      History of Present Illness: Aaron [name] is a 72 y.o. male who presents for evaluation of chest pain.  I have not seen him since November 2021.  He had palpitations.  He did have coronary calcium noted in 2019.  He did wear a 3-day monitor and had no significant arrhythmias noted.  He had a screening treadmill test.  This was negative for any evidence of ischemia.  He was in the emergency room a few days ago with chest pain.  This was a sharp pain.  It was somewhat right of sternum.  There was some discomfort with palpitation.  It started that day.  He was found to have poor anterior R wave progression was sent from urgent care to the ED.  His troponin was negative.  He was however told to follow-up with his cardiologist.  He was given aspirin which seemed to help.  There were no acute EKG ST changes.  He has continued to have some atypical chest discomfort associated with this.  He does walk 20 to 30 minutes 4-5 times a week and does not bring on the symptoms.  He has no new shortness of breath, PND or orthopnea.  He had no new palpitations, presyncope or syncope.   Past Medical History:  Diagnosis Date   Diabetes mellitus without complication (HCC)    GERD (gastroesophageal reflux disease)    Hyperlipidemia    Hypertension    Psoriasis    Sleep apnea    CPAP    History reviewed. No pertinent surgical history.   Current Outpatient Medications  Medication Sig Dispense Refill   metFORMIN (GLUCOPHAGE-XR) 500 MG 24 hr tablet TAKE 1 TABLET BY MOUTH DAILY AT 8PM 180 tablet 0   Multiple Vitamin (MULTI-VITAMIN PO) Take by mouth daily.     Omega-3 Fatty Acids (FISH OIL PO) Take 800 mg by mouth daily.     rosuvastatin (CRESTOR) 10 MG tablet Take 1 tablet  (10 mg total) by mouth daily. 90 tablet 3   verapamil (CALAN-SR) 120 MG CR tablet TAKE 1 TABLET(120 MG) BY MOUTH AT BEDTIME 30 tablet 2   vitamin B-12 (CYANOCOBALAMIN) 500 MCG tablet Take 500 mcg by mouth daily.     No current facility-administered medications for this visit.    Allergies:   Erythromycin and Levaquin [levofloxacin in d5w]     ROS:  Please see the history of present illness.   Otherwise, review of systems are positive for none.   All other systems are reviewed and negative.    PHYSICAL EXAM: VS:  BP (!) 142/83   Pulse 71   Ht 5\' 6"  (1.676 m)   Wt 189 lb (85.7 kg)   BMI 30.51 kg/m  , BMI Body mass index is 30.51 kg/m. GENERAL:  Well appearing NECK:  No jugular venous distention, waveform within normal limits, carotid upstroke brisk and symmetric, no bruits, no thyromegaly LYMPHATICS:  No cervical, inguinal adenopathy LUNGS:  Clear to auscultation bilaterally BACK:  No CVA tenderness CHEST:  Unremarkable HEART:  PMI not displaced or sustained,S1 and S2 within normal limits, no S3, no S4, no clicks, no rubs, no murmurs ABD:  Flat, positive bowel sounds normal in frequency in pitch,  no bruits, no rebound, no guarding, no midline pulsatile mass, no hepatomegaly, no splenomegaly     EKG:  EKG is ordered today. The ekg ordered today demonstrates sinus rhythm, rate 71, axis within normal limits, intervals within normal limits, no acute ST-T wave changes.   Recent Labs: 11/11/2022: ALT 15 11/16/2022: BUN 12; Creatinine, Ser 0.82; Hemoglobin 15.6; Platelets 268; Potassium 3.9; Sodium 136    Lipid Panel    Component Value Date/Time   CHOL 166 11/11/2022 1052   CHOL 135 10/15/2020 1325   TRIG 192.0 (H) 11/11/2022 1052   HDL 39.70 11/11/2022 1052   HDL 48 10/15/2020 1325   CHOLHDL 4 11/11/2022 1052   VLDL 38.4 11/11/2022 1052   LDLCALC 87 11/11/2022 1052   LDLCALC 71 10/15/2020 1325      Wt Readings from Last 3 Encounters:  11/20/22 189 lb (85.7 kg)   11/16/22 190 lb (86.2 kg)  11/11/22 193 lb (87.5 kg)      Other studies Reviewed: Additional studies/ records that were reviewed today include: None. Review of the above records demonstrates:  Please see elsewhere in the note.     ASSESSMENT AND PLAN:  CHEST PAIN: The patient's chest discomfort has nonanginal greater than anginal features.  I think the pretest probability of obstructive coronary disease is low.  However, he has significant cardiovascular risk factors. I will bring the patient back for a POET (Plain Old Exercise Test). This will allow me to screen for obstructive coronary disease, risk stratify and very importantly provide a prescription for exercise.  Dyslipidemia: I previously put him on Crestor 10 mg but this was stopped.  He does not think he had any side effects to it.  I would think this still to be indicated.  His cardiovascular risk score suggest a 15.210-year cardiovascular risk of major cardiovascular events which is above the threshold for starting statin.  This is mostly driven by diabetes.  I will restart 10 mg of Crestor and he should get a lipid profile per his primary provider in about 3 months.  Of note his most recent LDL was 87 with an HDL of 39.7.  Hypertension: His blood pressure is borderline he can keep an eye on this.  Have suggested he get a blood pressure cuff for home with goal being 120s over 70s.   Current medicines are reviewed at length with the patient today.  The patient does not have concerns regarding medicines.  The following changes have been made:    Labs/ tests ordered today include:   Orders Placed This Encounter  Procedures   EXERCISE TOLERANCE TEST (ETT)   EKG 12-Lead     Disposition:   FU with me as needed.   Signed, Minus Breeding, MD  11/20/2022 4:04 PM    [place]

## 2022-11-20 NOTE — Patient Instructions (Signed)
Medication Instructions:  Your physician has recommended you make the following change in your medication:   -Start taking rosuvastatin (crestor) 10mg  once daily.  *If you need a refill on your cardiac medications before your next appointment, please call your pharmacy*    Testing/Procedures: Your physician has requested that you have an exercise tolerance test.  Please also follow instruction sheet, as given. This will take place at [address] Do not drink or eat foods with caffeine for 24 hours before the test. (Chocolate, coffee, tea, or energy drinks) If you use an inhaler, bring it with you to the test. Do not smoke for 4 hours before the test. Wear comfortable shoes and clothing.    Follow-Up: At [facility], you and your health needs are our priority.  As part of our continuing mission to provide you with exceptional heart care, we have created designated Provider Care Teams.  These Care Teams include your primary Cardiologist (physician) and Advanced Practice Providers (APPs -  Physician Assistants and Nurse Practitioners) who all work together to provide you with the care you need, when you need it.  We recommend signing up for the patient portal called "MyChart".  Sign up information is provided on this After Visit Summary.  MyChart is used to connect with patients for Virtual Visits (Telemedicine).  Patients are able to view lab/test results, encounter notes, upcoming appointments, etc.  Non-urgent messages can be sent to your provider as well.   To learn more about what you can do with MyChart, go to [email or web address].    Your next appointment:   We will see you on an as needed basis.  Provider:   Minus Breeding, MD  Other Instructions Dr. Percival Spanish recommends getting an omron blood pressure cuff.   HOW TO TAKE YOUR BLOOD PRESSURE: Rest 5 minutes before taking your blood pressure. Don't smoke or drink caffeinated beverages for at least 30  minutes before. Take your blood pressure before (not after) you eat. Sit comfortably with your back supported and both feet on the floor (don't cross your legs). Elevate your arm to heart level on a table or a desk. Use the proper sized cuff. It should fit smoothly and snugly around your bare upper arm. There should be enough room to slip a fingertip under the cuff. The bottom edge of the cuff should be 1 inch above the crease of the elbow. Ideally, take 3 measurements at one sitting and record the average.

## 2022-11-23 ENCOUNTER — Telehealth: Payer: Self-pay

## 2022-11-23 NOTE — Telephone Encounter (Signed)
        Patient  visited [facility] on 3/18     Telephone encounter attempt :  1st  A HIPAA compliant voice message was left requesting a return call.  Instructed patient to call back.   [facility] [phone] [address], [place], [place] [address] Phone: [phone] Email: [email or web address]

## 2022-11-24 ENCOUNTER — Telehealth: Payer: Self-pay

## 2022-11-24 NOTE — Telephone Encounter (Signed)
     Patient  visit on 3/18  at [facility] you been able to follow up with your primary care physician? Yes   The patient was or was not able to obtain any needed medicine or equipment. Yes   Are there diet recommendations that you are having difficulty following? Na   Patient expresses understanding of discharge instructions and education provided has no other needs at this time.  Yes     [facility] [phone] [address], [place], [place] [address] Phone: [phone] Email: [email or web address]

## 2022-12-14 ENCOUNTER — Telehealth (HOSPITAL_COMMUNITY): Payer: Self-pay | Admitting: *Deleted

## 2022-12-14 NOTE — Telephone Encounter (Signed)
Lest a message regarding instructions for upcoming ETT.  Aaron [name]

## 2022-12-16 ENCOUNTER — Encounter: Payer: Self-pay | Admitting: Emergency Medicine

## 2022-12-16 ENCOUNTER — Ambulatory Visit (HOSPITAL_COMMUNITY): Payer: Medicare Other | Attending: Cardiology

## 2022-12-16 DIAGNOSIS — I2584 Coronary atherosclerosis due to calcified coronary lesion: Secondary | ICD-10-CM | POA: Insufficient documentation

## 2022-12-16 DIAGNOSIS — R0789 Other chest pain: Secondary | ICD-10-CM | POA: Insufficient documentation

## 2022-12-16 DIAGNOSIS — I2510 Atherosclerotic heart disease of native coronary artery without angina pectoris: Secondary | ICD-10-CM | POA: Diagnosis not present

## 2022-12-17 ENCOUNTER — Ambulatory Visit (INDEPENDENT_AMBULATORY_CARE_PROVIDER_SITE_OTHER): Payer: Medicare Other | Admitting: Family Medicine

## 2022-12-17 ENCOUNTER — Encounter: Payer: Self-pay | Admitting: Family Medicine

## 2022-12-17 VITALS — BP 126/72 | HR 70 | Temp 97.8°F | Ht 66.0 in | Wt 192.0 lb

## 2022-12-17 DIAGNOSIS — E1165 Type 2 diabetes mellitus with hyperglycemia: Secondary | ICD-10-CM | POA: Diagnosis not present

## 2022-12-17 DIAGNOSIS — S80812A Abrasion, left lower leg, initial encounter: Secondary | ICD-10-CM | POA: Diagnosis not present

## 2022-12-17 DIAGNOSIS — Z23 Encounter for immunization: Secondary | ICD-10-CM | POA: Diagnosis not present

## 2022-12-17 DIAGNOSIS — L231 Allergic contact dermatitis due to adhesives: Secondary | ICD-10-CM

## 2022-12-17 LAB — EXERCISE TOLERANCE TEST
Angina Index: 0
Duke Treadmill Score: 4
Estimated workload: 5.8
Exercise duration (min): 4 min
Exercise duration (sec): 3 s
MPHR: 151 {beats}/min
Peak HR: 153 {beats}/min
Percent HR: 101 %
RPE: 17
Rest HR: 73 {beats}/min
ST Depression (mm): 0 mm

## 2022-12-17 NOTE — Patient Instructions (Addendum)
Stop using hydrogen peroxide. Only use a mild soap to clean the wound and then cover it with a non adhesive bandage and paper tape.   Consider switching Neosporin to Bacitracin or Aquaphor. Some people are allergic or sensitive to neomycin.   You may use an over the counter hydrocortisone on the surrounding area that is a reaction to the adhesives.   Follow up if the area is showing any signs of infection such as a pus -like drainage, increasing area of redness or worsening pain.   Your last tetanus vaccine was in 2017.

## 2022-12-17 NOTE — Progress Notes (Signed)
Subjective:     Patient ID: Aaron [name], male    DOB: [birth year], 72 y.o.   MRN: [id number]  Chief Complaint  Patient presents with   Wound Check    Left lateral leg wound, using neosporin and hydrogen peroxide on it and has been 11 days and doesn't seem to be getting any better. Sunday 4/7 was doing laundry in basement and scraped leg against cinder block. Rash from bandage.     HPI Patient is in today for a wound on his left lower leg x 11 days. States he scraped his leg on a cinder block in his basement. The area did not bleed. He has been using hydrogen peroxide and Neosporin daily.  He does not feel that the area is healing.  The area is tender to palpation.  He also has a area of redness where the adhesive from the bandage was on his leg. Denies numbness, tingling or pain apart from his baseline.   Cannot recall when his last tetanus shot was.  No fever, chills.  States he feels well.  He has diabetes.  Last A1c 6.8%.  Health Maintenance Due  Topic Date Due   COVID-19 Vaccine (3 - Moderna risk series) 08/26/2021   OPHTHALMOLOGY EXAM  11/12/2022    Past Medical History:  Diagnosis Date   Diabetes mellitus without complication    GERD (gastroesophageal reflux disease)    Hyperlipidemia    Hypertension    Psoriasis    Sleep apnea    CPAP    History reviewed. No pertinent surgical history.  Family History  Problem Relation Age of Onset   Cancer Mother        breast ca spread to bone   Cancer Father 45       brain cancer   Migraines Neg Hx    Headache Neg Hx     Social History   Socioeconomic History   Marital status: Married    Spouse name: Raynelle Fanning   Number of children: 0   Years of education: Not on file   Highest education level: High school graduate  Occupational History   Occupation: Airline pilot, retired   Tobacco Use   Smoking status: Former    Types: Cigarettes    Quit date: 1985    Years since quitting: 39.3   Smokeless tobacco: Never  Vaping  Use   Vaping Use: Never used  Substance and Sexual Activity   Alcohol use: Yes    Alcohol/week: 7.0 standard drinks of alcohol    Types: 7 Cans of beer per week    Comment: 1 beer with dinner usually daily   Drug use: No   Sexual activity: Yes  Other Topics Concern   Not on file  Social History Narrative   Marital status:  Married.          Children:   none      Lives:      Employment:  Airline pilot      Tobacco:  none      Alcohol:  About 7 drinks per week      Drugs:   none      Exercise:      Seatbelt:      Education: [employer].      Lives at home with wife   Right handed   Caffeine: 2 cups/day   Social Determinants of Health   Financial Resource Strain: Low Risk  (06/24/2022)   Overall Financial Resource Strain (CARDIA)  Difficulty of Paying Living Expenses: Not hard at all  Food Insecurity: No Food Insecurity (06/24/2022)   Hunger Vital Sign    Worried About Running Out of Food in the Last Year: Never true    Ran Out of Food in the Last Year: Never true  Transportation Needs: No Transportation Needs (06/24/2022)   PRAPARE - Administrator, Civil Service (Medical): No    Lack of Transportation (Non-Medical): No  Physical Activity: Sufficiently Active (06/24/2022)   Exercise Vital Sign    Days of Exercise per Week: 5 days    Minutes of Exercise per Session: 30 min  Stress: No Stress Concern Present (06/24/2022)   [employer] of Occupational Health - Occupational Stress Questionnaire    Feeling of Stress : Not at all  Social Connections: Unknown (06/24/2022)   Social Connection and Isolation Panel [NHANES]    Frequency of Communication with Friends and Family: More than three times a week    Frequency of Social Gatherings with Friends and Family: More than three times a week    Attends Religious Services: More than 4 times per year    Active Member of [employer] or Organizations: Yes    Attends Banker Meetings: More than 4 times per year     Marital Status: Not on file  Intimate Partner Violence: Not At Risk (06/24/2022)   Humiliation, Afraid, Rape, and Kick questionnaire    Fear of Current or Ex-Partner: No    Emotionally Abused: No    Physically Abused: No    Sexually Abused: No    Outpatient Medications Prior to Visit  Medication Sig Dispense Refill   metFORMIN (GLUCOPHAGE-XR) 500 MG 24 hr tablet TAKE 1 TABLET BY MOUTH DAILY AT 8PM 180 tablet 0   Multiple Vitamin (MULTI-VITAMIN PO) Take by mouth daily.     Omega-3 Fatty Acids (FISH OIL PO) Take 800 mg by mouth daily.     rosuvastatin (CRESTOR) 10 MG tablet Take 1 tablet (10 mg total) by mouth daily. 90 tablet 3   verapamil (CALAN-SR) 120 MG CR tablet TAKE 1 TABLET(120 MG) BY MOUTH AT BEDTIME 30 tablet 2   vitamin B-12 (CYANOCOBALAMIN) 500 MCG tablet Take 500 mcg by mouth daily.     No facility-administered medications prior to visit.    Allergies  Allergen Reactions   Erythromycin Itching   Levaquin [Levofloxacin In D5w]     Light sensitivity     ROS     Objective:    Physical Exam  BP 126/72 (BP Location: Left Arm, Patient Position: Sitting, Cuff Size: Large)   Pulse 70   Temp 97.8 F (36.6 C) (Temporal)   Ht 5\' 6"  (1.676 m)   Wt 192 lb (87.1 kg)   SpO2 98%   BMI 30.99 kg/m  Wt Readings from Last 3 Encounters:  12/17/22 192 lb (87.1 kg)  11/20/22 189 lb (85.7 kg)  11/16/22 190 lb (86.2 kg)   Abrasion of left lateral lower leg, non healing. No induration, exudate or pain. TTP.  Surrounding red papules, pruritic, consistent with contact dermatitis  LLE is neurovascularly intact.       Assessment & Plan:   Problem List Items Addressed This Visit       Endocrine   Diabetes   Other Visit Diagnoses     Abrasion of left lower leg, initial encounter    -  Primary   Relevant Orders   Td vaccine greater than or equal to 7yo preservative free IM (  Completed)   Contact dermatitis due to adhesives, unspecified contact dermatitis type           Recommend he stop using hydrogen peroxide daily as this does not allow for healing.  We also discussed that he may be sensitive to neomycin.  Recommend using a mild soap to cleanse the area and switching to bacitracin or Aquaphor.  Keep the area covered with nonadhesive dressing and paper tape due to apparent adhesive allergy or irritation. Last Td was 2017.  He is unsure if the cinderblock he scraped his leg on would be considered "dirty".  Updated Td today.  Discussed potential side effects. Diabetes is fairly well-controlled. Discussed signs and symptoms of infection and he will follow-up if he notices any worsening or if the area is not improving over the next few days.  I am having Jamison Oka "Bill" maintain his Multiple Vitamin (MULTI-VITAMIN PO), vitamin B-12, Omega-3 Fatty Acids (FISH OIL PO), verapamil, metFORMIN, and rosuvastatin.  No orders of the defined types were placed in this encounter.

## 2022-12-21 ENCOUNTER — Ambulatory Visit (INDEPENDENT_AMBULATORY_CARE_PROVIDER_SITE_OTHER): Payer: Medicare Other | Admitting: Family Medicine

## 2022-12-21 ENCOUNTER — Encounter: Payer: Self-pay | Admitting: Neurology

## 2022-12-21 ENCOUNTER — Encounter: Payer: Self-pay | Admitting: Family Medicine

## 2022-12-21 VITALS — BP 134/70 | HR 82 | Temp 98.3°F | Resp 20 | Ht 66.0 in | Wt 192.0 lb

## 2022-12-21 DIAGNOSIS — J069 Acute upper respiratory infection, unspecified: Secondary | ICD-10-CM | POA: Diagnosis not present

## 2022-12-21 DIAGNOSIS — J029 Acute pharyngitis, unspecified: Secondary | ICD-10-CM | POA: Diagnosis not present

## 2022-12-21 LAB — POCT INFLUENZA A/B
Influenza A, POC: NEGATIVE
Influenza B, POC: NEGATIVE

## 2022-12-21 LAB — POCT RAPID STREP A (OFFICE): Rapid Strep A Screen: NEGATIVE

## 2022-12-21 LAB — POC COVID19 BINAXNOW: SARS Coronavirus 2 Ag: NEGATIVE

## 2022-12-21 NOTE — Progress Notes (Signed)
Assessment & Plan:  1. Viral URI Education provided on viral URIs.  Encouraged throat lozenges, chloraseptic spray, warm salt water gargles, hot tea/honey, cough syrup (Delsym), Tylenol/Ibuprofen, Vicks, Dayquil/Nyquil, and a humidifier at night.   2. Sore throat - POC COVID-19 BinaxNow - POCT Influenza A/B - POCT rapid strep A  Results for orders placed or performed in visit on 12/21/22  POC COVID-19 BinaxNow  Result Value Ref Range   SARS Coronavirus 2 Ag Negative Negative  POCT Influenza A/B  Result Value Ref Range   Influenza A, POC Negative Negative   Influenza B, POC Negative Negative  POCT rapid strep A  Result Value Ref Range   Rapid Strep A Screen Negative Negative    Follow up plan: Return if symptoms worsen or fail to improve.  Deliah Boston, MSN, APRN, FNP-C  Subjective:  HPI: Aaron [name] is a 72 y.o. male presenting on 12/21/2022 for URI (ST, cough, congestion, fatigue and body aches - this started on Friday )  Patient complains of cough, head congestion, runny nose, sneezing, sore throat, postnasal drainage, fatigue and body aches. He denies fever, shortness of breath, wheezing, nausea, vomiting, abdominal pain, and diarrhea. Onset of symptoms was 3 days ago, gradually improving since that time. He is drinking plenty of fluids. Evaluation to date: none. Treatment to date:  Nyquil . He does not smoke.    ROS: Negative unless specifically indicated above in HPI.   Relevant past medical history reviewed and updated as indicated.   Allergies and medications reviewed and updated.   Current Outpatient Medications:    metFORMIN (GLUCOPHAGE-XR) 500 MG 24 hr tablet, TAKE 1 TABLET BY MOUTH DAILY AT 8PM, Disp: 180 tablet, Rfl: 0   Multiple Vitamin (MULTI-VITAMIN PO), Take by mouth daily., Disp: , Rfl:    Omega-3 Fatty Acids (FISH OIL PO), Take 800 mg by mouth daily., Disp: , Rfl:    rosuvastatin (CRESTOR) 10 MG tablet, Take 1 tablet (10 mg total) by mouth  daily., Disp: 90 tablet, Rfl: 3   verapamil (CALAN-SR) 120 MG CR tablet, TAKE 1 TABLET(120 MG) BY MOUTH AT BEDTIME, Disp: 30 tablet, Rfl: 2   vitamin B-12 (CYANOCOBALAMIN) 500 MCG tablet, Take 500 mcg by mouth daily., Disp: , Rfl:   Allergies  Allergen Reactions   Erythromycin Itching   Levaquin [Levofloxacin In D5w]     Light sensitivity     Objective:   BP 134/70   Pulse 82   Temp 98.3 F (36.8 C)   Resp 20   Ht  (1.676 m)   Wt 192 lb (87.1 kg)   BMI 30.99 kg/m    Physical Exam Vitals reviewed.  Constitutional:      General: He is not in acute distress.    Appearance: Normal appearance. He is not ill-appearing, toxic-appearing or diaphoretic.  HENT:     Head: Normocephalic and atraumatic.     Right Ear: Tympanic membrane, ear canal and external ear normal. There is no impacted cerumen.     Left Ear: Tympanic membrane, ear canal and external ear normal. There is no impacted cerumen.     Nose: Congestion present.     Right Sinus: No maxillary sinus tenderness or frontal sinus tenderness.     Left Sinus: No maxillary sinus tenderness or frontal sinus tenderness.     Mouth/Throat:     Mouth: Mucous membranes are moist.     Pharynx: Oropharynx is clear. No oropharyngeal exudate or posterior oropharyngeal erythema.  Tonsils: No tonsillar exudate or tonsillar abscesses.  Eyes:     General: No scleral icterus.       Right eye: No discharge.        Left eye: No discharge.     Conjunctiva/sclera: Conjunctivae normal.  Cardiovascular:     Rate and Rhythm: Normal rate.  Pulmonary:     Effort: Pulmonary effort is normal. No respiratory distress.  Musculoskeletal:        General: Normal range of motion.     Cervical back: Normal range of motion.  Lymphadenopathy:     Cervical: No cervical adenopathy.  Skin:    General: Skin is warm and dry.  Neurological:     Mental Status: He is alert and oriented to person, place, and time. Mental status is at baseline.   Psychiatric:        Mood and Affect: Mood normal.        Behavior: Behavior normal.        Thought Content: Thought content normal.        Judgment: Judgment normal.

## 2022-12-21 NOTE — Patient Instructions (Signed)
Throat lozenges, chloraseptic spray, warm salt water gargles, hot tea/honey, cough syrup (Delsym), Tylenol/Ibuprofen, Vicks, and a humidifier at night. Continue Nyquil. Add Dayquil.

## 2022-12-23 ENCOUNTER — Encounter: Payer: Self-pay | Admitting: Family Medicine

## 2022-12-23 DIAGNOSIS — B9689 Other specified bacterial agents as the cause of diseases classified elsewhere: Secondary | ICD-10-CM

## 2022-12-25 ENCOUNTER — Encounter: Payer: Self-pay | Admitting: Neurology

## 2022-12-25 ENCOUNTER — Ambulatory Visit (INDEPENDENT_AMBULATORY_CARE_PROVIDER_SITE_OTHER): Payer: Medicare Other | Admitting: Neurology

## 2022-12-25 VITALS — BP 152/84 | HR 75 | Ht 66.0 in | Wt 191.4 lb

## 2022-12-25 DIAGNOSIS — R51 Headache with orthostatic component, not elsewhere classified: Secondary | ICD-10-CM

## 2022-12-25 DIAGNOSIS — H93A9 Pulsatile tinnitus, unspecified ear: Secondary | ICD-10-CM | POA: Diagnosis not present

## 2022-12-25 DIAGNOSIS — H9313 Tinnitus, bilateral: Secondary | ICD-10-CM

## 2022-12-25 DIAGNOSIS — G4733 Obstructive sleep apnea (adult) (pediatric): Secondary | ICD-10-CM | POA: Diagnosis not present

## 2022-12-25 DIAGNOSIS — R519 Headache, unspecified: Secondary | ICD-10-CM | POA: Diagnosis not present

## 2022-12-25 DIAGNOSIS — G8929 Other chronic pain: Secondary | ICD-10-CM

## 2022-12-25 DIAGNOSIS — H9193 Unspecified hearing loss, bilateral: Secondary | ICD-10-CM

## 2022-12-25 MED ORDER — AMOXICILLIN-POT CLAVULANATE 875-125 MG PO TABS: 1.0000 | ORAL_TABLET | Freq: Two times a day (BID) | ORAL | 0 refills | Status: AC

## 2022-12-25 NOTE — Progress Notes (Unsigned)
Chief Complaint  Patient presents with   New Patient (Initial Visit)    Pt in 4 Pt here for Tinnitus Pt states ringing in both ears     HISTORY OF PRESENT ILLNESS:  Tinnius strted a year ago. Aaron [name]. Both ears. Continuous. Feel like it cause headaches.  MRI brain w/wo contrast thin cuts through IAC CTA  Hearing loss Daily headaches Headaches restarted or became more severe when the tinnitus started [facility] has a tinnitius climic Headaches are feeling like a hat a really tight.  More headaches ONO  Highly fatigued patient with poor sleep efficiency, IMPRESSION: 1. Very mild Obstructive Sleep Apnea (OSA) with an AHI just above 10/h. Strongly accentuated by REM sleep. 2. Mild Periodic Limb Movement Disorder (PLMD), clustered between 1.30 and 3 AM. 3. Snoring 4. Normal EKG  RECOMMENDATIONS: 1. Advise full-night, attended, CPAP titration study to optimize therapy. Plan B is autotitration CPAP 5-15 cm water, 2 cm EPR and heated humidification with mask of choice.   OXYGEN SATURATION & C02:  The [place] baseline 02 saturation was  97%, with the lowest being 83%. Time spent below 89% saturation  equaled 6 minutes.    12/25/22 ALL:  Aaron Aaron [name] returns for follow up for headaches. He was last seen 12/2020 and doing fairly well. We discussed starting topiramate but he was hesitant. PCP had started Ambien around that time. He was unable to tolerate and discontinued. He reports getting Covid 02/2021. He was pretty ill for about 2 months. Since, headaches have worsened. He has a daily headache. Pain is described as a pressure sensation. No light sensitivity. Possible sound sensitivity with bad headaches. No nausea or worsening with activity. He wakes with headache every day. Headache seems to be better during the day. He feels that he is able to distract himself. He takes Excedrin 2 tabs 2-3 times a month for severe headaches. He drinks about 40-60 ounces of water daily.   He reports tinnitus started  about 2 months ago. He was seen by ENT. He has concerns of possible sleep apnea. ENT and audiology and diagnosed with mild hearing loss, otherwise normal evaluation. He purchased an Emay continuous pulse oximeter.  that indicated multiple desaturations (nadir 84%). It is unclear how long O2 remains below 88% but appears to be around 10 minutes. PCP sent sleep referral 12/03/2020. He does not snore. He denies significant daytime sleepiness. No family history of sleep apnea. No obvious concerns of apneic events. He is retired. He does chores around the home most days. He may nap once a day for about 30-60 minutes. He sleeps fairly well at night. It may take a little while to get to sleep (about an hour) but he sleeps well when he does. Usually bedtime is around 12am and wakes around 8-9am. He does not feel refreshed when he wakes. He wakes with dry mouth on occasion.   01/09/2021 ALL:  He returns for follow up for headaches. We started propranolol in 10/2020. He was concerned about increased sleepiness and dose was switched to ER. He called the office yesterday with concerns of cold hands and feet and reported PCP wanted him to stop propranolol for concerns of Raynaud's. Headaches were unchanged and no improvement noted. He has had more anxiety. PCP recommended he see psychiatry. He reports that his wife feels that he is a hypochondriac. He was recently started on Ambien 5mg  due to insomnia. He has tolerated well for the past month. It is unclear if it is helping. He  feels that he has trouble remembering things over the past few months. He has been seen by rheumatology, ophthalmology, cardiology for various concerns. All workup has been unremarkable.   10/30/2020 ALL:  Aaron Aaron [name] is a 72 y.o. male here today for follow up for headaches. He was seen by Dr Lucia Gaskins  07/2019 and 09/2019. MRI was normal. Headaches were thought to be from clenching. He had used mouth guard and flexeril which helped. Sleep study also  recommended but was not pursued.  He had a CT with PCP 10/2020 following an accident at home where he hit his head. CT was normal. He reports that headaches continue. Headache is constant. Pain is usually on the top of his head. No migraine symptoms. Relieved when he pushes on temples. His wife says he does not snore. He has lost 40 pounds. He takes meloxicam daily for back pain. He has taken Excedrin PM in the past but not since starting meloxicam. He has not been on prevention medications. He is followed by PCP for intermittent dizziness. Previous cardiology workup normal.   HISTORY (copied from Dr Trevor Mace previous note)  Interval history 07/11/2020: He saw the dentist and he is clenching and grinding at night, he is going to get a mouthguard. No changes since he has been seen. He is still waking up with the headache. Will add Flexeril at bedtime for teeth clenching. We discussed a plan.   HPI:  Aaron Aaron [name] is a 72 y.o. male here as requested by Aaron Aaron [name], * for persistent headache as well as headaches and myalgias.  I reviewed Dr. Latrelle Dodrill notes.  Patient has muscle pain that started 2 weeks prior to last appointment which was May 29, 2019.  He has a past medical history of diabetes on Metformin and glipizide, hypertension on Norvasc 5 mg, hyperlipidemia on a statin.  Symptoms included upper and lower extremity muscle pain, no injuries, and "stress headaches" 3-4 times weekly this started in January.  No history of migraine headaches.  No other associated symptoms, no abdominal pain, chest pain, fever, nausea, rash, shortness of breath or vomiting, coughing, dizziness, fever.  Examination was normal including physical examination and neurologic exam was nonfocal.  CBC 1 month ago showed elevated white blood cells.  Hemoglobin A1c 6.62-month ago, 6 months ago was 8.7.  CMP unremarkable.   He is here alone. One day a few months ago he had a headache. It was consistent, about March or  April. He went to urgent care, neg for covid19, he was started on fioricet and it did not help. No hx of migraines. Excedrin did not help. Feels like he is wearing a hat that is on tight. It comes and goes. He notices it in the morning it gets better when he gets to work. Or with laying in bed. No throbbing. He rarely had headaches in the past in the temples but this is different. Can last for hours. Hasn't progressed or improved. Coffee maybe helps. Unknown triggers. Annoying not excruciating. No vision problems or vision changes. No hearing changes. He gets muscle aches in the arms, no weakness, started prior to the headache, he has some cervical neck pain, no radicular symptoms, he has low back pain that is long-term and Aaron [name]. He has some numbness and tingling in his feet but not frequently. He denies snoring or excessive fatigue during the day, he doesn't complain about not feeling refreshed. No associated light or sound sensitivity, no migraines. He stopped the statin in  September still symptomatic. No fevers. Some stiffness in the neck but no deficits of range of motion.    Reviewed notes, labs and imaging from outside physicians, which showed:    Reviewed XR cervical spine images and agree: INGS: Vertebral body height and alignment are maintained. Loss of disc space height and endplate spurring are seen at C5-6. The C7-T1 level is not visualized has no swimmer's view is provided.   IMPRESSION: C5-6 degenerative disc disease.   REVIEW OF SYSTEMS: Out of a complete 14 system review of symptoms, the patient complains only of the following symptoms, headaches, dizziness and all other reviewed systems are negative.   ESS: 4   ALLERGIES: Allergies  Allergen Reactions   Erythromycin Itching   Levaquin [Levofloxacin In D5w]     Light sensitivity      HOME MEDICATIONS: Outpatient Medications Prior to Visit  Medication Sig Dispense Refill   metFORMIN (GLUCOPHAGE-XR) 500 MG 24 hr tablet  TAKE 1 TABLET BY MOUTH DAILY AT 8PM 180 tablet 0   Multiple Vitamin (MULTI-VITAMIN PO) Take by mouth daily.     Omega-3 Fatty Acids (FISH OIL PO) Take 800 mg by mouth daily.     rosuvastatin (CRESTOR) 10 MG tablet Take 1 tablet (10 mg total) by mouth daily. 90 tablet 3   verapamil (CALAN-SR) 120 MG CR tablet TAKE 1 TABLET(120 MG) BY MOUTH AT BEDTIME 30 tablet 2   vitamin B-12 (CYANOCOBALAMIN) 500 MCG tablet Take 500 mcg by mouth daily.     No facility-administered medications prior to visit.     PAST MEDICAL HISTORY: Past Medical History:  Diagnosis Date   Diabetes mellitus without complication (HCC)    GERD (gastroesophageal reflux disease)    Hyperlipidemia    Hypertension    Psoriasis    Sleep apnea    CPAP     PAST SURGICAL HISTORY: History reviewed. No pertinent surgical history.   FAMILY HISTORY: Family History  Problem Relation Age of Onset   Cancer Mother        breast ca spread to bone   Cancer Father 39       brain cancer   Migraines Neg Hx    Headache Neg Hx      SOCIAL HISTORY: Social History   Socioeconomic History   Marital status: Married    Spouse name: Raynelle Fanning   Number of children: 0   Years of education: Not on file   Highest education level: High school graduate  Occupational History   Occupation: Airline pilot, retired   Tobacco Use   Smoking status: Former    Types: Cigarettes    Quit date: 1985    Years since quitting: 39.3   Smokeless tobacco: Never  Vaping Use   Vaping Use: Never used  Substance and Sexual Activity   Alcohol use: Not Currently    Alcohol/week: 7.0 standard drinks of alcohol    Types: 7 Cans of beer per week   Drug use: No   Sexual activity: Yes  Other Topics Concern   Not on file  Social History Narrative   Marital status:  Married.          Children:   none      Lives:      Employment:  Airline pilot      Tobacco:  none      Alcohol:  About 7 drinks per week      Drugs:   none      Exercise:      Seatbelt:  Education: [employer].      Lives at home with wife   Right handed   Caffeine: 2 cups/day   Social Determinants of Health   Financial Resource Strain: Low Risk  (06/24/2022)   Overall Financial Resource Strain (CARDIA)    Difficulty of Paying Living Expenses: Not hard at all  Food Insecurity: No Food Insecurity (06/24/2022)   Hunger Vital Sign    Worried About Running Out of Food in the Last Year: Never true    Ran Out of Food in the Last Year: Never true  Transportation Needs: No Transportation Needs (06/24/2022)   PRAPARE - Administrator, Civil Service (Medical): No    Lack of Transportation (Non-Medical): No  Physical Activity: Sufficiently Active (06/24/2022)   Exercise Vital Sign    Days of Exercise per Week: 5 days    Minutes of Exercise per Session: 30 min  Stress: No Stress Concern Present (06/24/2022)   [employer] of Occupational Health - Occupational Stress Questionnaire    Feeling of Stress : Not at all  Social Connections: Unknown (06/24/2022)   Social Connection and Isolation Panel [NHANES]    Frequency of Communication with Friends and Family: More than three times a week    Frequency of Social Gatherings with Friends and Family: More than three times a week    Attends Religious Services: More than 4 times per year    Active Member of [employer] or Organizations: Yes    Attends Banker Meetings: More than 4 times per year    Marital Status: Not on file  Intimate Partner Violence: Not At Risk (06/24/2022)   Humiliation, Afraid, Rape, and Kick questionnaire    Fear of Current or Ex-Partner: No    Emotionally Abused: No    Physically Abused: No    Sexually Abused: No      PHYSICAL EXAM  Vitals:   12/25/22 1118  BP: (!) 152/84  Pulse: 75  Weight: 191 lb 6.4 oz (86.8 kg)  Height: 5\' 6"  (1.676 m)    Body mass index is 30.89 kg/m.   Generalized: Well developed, in no acute distress  Cardiology: normal rate and rhythm, no  murmur auscultated  Respiratory: clear to auscultation bilaterally    Neurological examination  Mentation: Alert oriented to time, place, history taking. Follows all commands speech and language fluent Cranial nerve II-XII: Pupils were equal round reactive to light. Extraocular movements were full, visual field were full on confrontational test. Facial sensation and strength were normal. Head turning and shoulder shrug  were normal and symmetric. Motor: The motor testing reveals 5 over 5 strength of all 4 extremities. Good symmetric motor tone is noted throughout.  Gait and station: Gait is normal.     DIAGNOSTIC DATA (LABS, IMAGING, TESTING) - I reviewed patient records, labs, notes, testing and imaging myself where available.  Lab Results  Component Value Date   WBC 7.8 11/16/2022   HGB 15.6 11/16/2022   HCT 48.4 11/16/2022   MCV 86.1 11/16/2022   PLT 268 11/16/2022      Component Value Date/Time   NA 136 11/16/2022 1837   NA 141 10/15/2020 1325   K 3.9 11/16/2022 1837   CL 104 11/16/2022 1837   CO2 25 11/16/2022 1837   GLUCOSE 106 (H) 11/16/2022 1837   BUN 12 11/16/2022 1837   BUN 13 10/15/2020 1325   CREATININE 0.82 11/16/2022 1837   CREATININE 0.71 04/29/2015 1339   CALCIUM 9.0 11/16/2022 1837  PROT 7.1 11/11/2022 1052   PROT 6.8 10/15/2020 1325   ALBUMIN 4.0 11/11/2022 1052   ALBUMIN 4.2 10/15/2020 1325   AST 15 11/11/2022 1052   ALT 15 11/11/2022 1052   ALKPHOS 72 11/11/2022 1052   BILITOT 0.4 11/11/2022 1052   BILITOT 0.4 10/15/2020 1325   GFRNONAA >60 11/16/2022 1837   GFRNONAA >89 04/29/2015 1339   GFRAA 115 10/15/2020 1325   GFRAA >89 04/29/2015 1339   Lab Results  Component Value Date   CHOL 166 11/11/2022   HDL 39.70 11/11/2022   LDLCALC 87 11/11/2022   TRIG 192.0 (H) 11/11/2022   CHOLHDL 4 11/11/2022   Lab Results  Component Value Date   HGBA1C 6.8 (H) 11/11/2022   Lab Results  Component Value Date   VITAMINB12 331 06/12/2020   Lab  Results  Component Value Date   TSH 2.750 04/30/2020        No data to display               No data to display           ASSESSMENT AND PLAN  72 y.o. year old male  has a past medical history of Diabetes mellitus without complication (HCC), GERD (gastroesophageal reflux disease), Hyperlipidemia, Hypertension, Psoriasis, and Sleep apnea. here with   No diagnosis found.  Aaron Aaron [name] continues to have chronic tension style headaches. Propranolol was not effective. We have discussed trial of topiramate. He is very hesitant. PCP has referred him to sleep medicine. He wishes to have evaluation prior to considering any prevention medications. He does have morning headaches and dry mouth. No other red flag warnings for sleep apnea. ESS 4. He has used a pulse oximeter at home and concerned of possible desaturations. HST may be helpful. Healthy lifestyle habits encouraged. He will continue close follow up with PCP. He will follow up with me pending sleep eval.     No orders of the defined types were placed in this encounter.    No orders of the defined types were placed in this encounter.    Shawnie Dapper, MSN, FNP-C 12/25/2022, 11:38 AM  [facility] [address], [address] [place], [place] [address] [phone]

## 2022-12-25 NOTE — Patient Instructions (Addendum)
Tinnius strted a year ago. Stable. Both ears. Continuous. Feel like it cause headaches.  MRI brain w/wo contrast thin cuts through IAC CTA - to look at arteries [facility] has a tinnitius climic - have a call in to your audiologist ONO: overnight oxygen monitor Talk to [place] the audiologist [facility] may have tinnitus clinic - speech and hearing center Recommend which antidepressant and what other modalities of treatment Discuss morning headaches with Dr. Richardean Chimera  Tinnitus Tinnitus refers to hearing a sound when there is no actual source for that sound. This is often described as ringing in the ears. However, people with this condition may hear a variety of noises, in one ear or in both ears. The sounds of tinnitus can be soft, loud, or somewhere in between. Tinnitus can last for a few seconds or can be constant for days. It may go away without treatment and come back at various times. When tinnitus is constant or happens often, it can lead to other problems, such as trouble sleeping and trouble concentrating. Almost everyone experiences tinnitus at some point. Tinnitus is not the same as hearing loss. Tinnitus that is long-lasting (chronic) or comes back often (recurs) may require medical attention. What are the causes? The cause of tinnitus is often not known. In some cases, it can result from: Exposure to loud noises from machinery, music, or other sources. An object (foreign body) stuck in the ear. Earwax buildup. Drinking alcohol or caffeine. Taking certain medicines. Age-related hearing loss. It may also be caused by medical conditions such as: Ear or sinus infections. Heart diseases or high blood pressure. Allergies. Mnire's disease. Thyroid problems. Tumors. A weak, bulging blood vessel (aneurysm) near the ear. What increases the risk? The following factors may make you more likely to develop this condition: Exposure to loud noises. Age. Tinnitus is more likely in older  individuals. Using alcohol or tobacco. What are the signs or symptoms? The main symptom of tinnitus is hearing a sound when there is no source for that sound. It may sound like: Buzzing. Sizzling. Ringing. Blowing air. Hissing. Whistling. Other sounds may include: Roaring. Running water. A musical note. Tapping. Humming. Symptoms may affect only one ear (unilateral) or both ears (bilateral). How is this diagnosed? Tinnitus is diagnosed based on your symptoms, your medical history, and a physical exam. Your health care provider may do a thorough hearing test (audiologic exam) if your tinnitus: Is unilateral. Causes hearing difficulties. Lasts 6 months or longer. You may work with a health care provider who specializes in hearing disorders (audiologist). You may be asked questions about your symptoms and how they affect your daily life. You may have other tests done, such as: CT scan. MRI. An imaging test of how blood flows through your blood vessels (angiogram). How is this treated? Treating an underlying medical condition can sometimes make tinnitus go away. If your tinnitus continues, other treatments may include: Therapy and counseling to help you manage the stress of living with tinnitus. Sound generators to mask the tinnitus. These include: Tabletop sound machines that play relaxing sounds to help you fall asleep. Wearable devices that fit in your ear and play sounds or music. Acoustic neural stimulation. This involves using headphones to listen to music that contains an auditory signal. Over time, listening to this signal may change some pathways in your brain and make you less sensitive to tinnitus. This treatment is used for very severe cases when no other treatment is working. Using hearing aids or cochlear implants if  your tinnitus is related to hearing loss. Hearing aids are worn in the outer ear. Cochlear implants are surgically placed in the inner ear. Follow these  instructions at home: Managing symptoms     When possible, avoid being in loud places and being exposed to loud sounds. Wear hearing protection, such as earplugs, when you are exposed to loud noises. Use a white noise machine, a humidifier, or other devices to mask the sound of tinnitus. Practice techniques for reducing stress, such as meditation, yoga, or deep breathing. Work with your health care provider if you need help with managing stress. Sleep with your head slightly raised. This may reduce the impact of tinnitus. General instructions Do not use stimulants, such as nicotine, alcohol, or caffeine. Talk with your health care provider about other stimulants to avoid. Stimulants are substances that can make you feel alert and attentive by increasing certain activities in the body (such as heart rate and blood pressure). These substances may make tinnitus worse. Take over-the-counter and prescription medicines only as told by your health care provider. Try to get plenty of sleep each night. Keep all follow-up visits. This is important. Contact a health care provider if: Your tinnitus continues for 3 weeks or longer without stopping. You develop sudden hearing loss. Your symptoms get worse or do not get better with home care. You feel you are not able to manage the stress of living with tinnitus. Get help right away if: You develop tinnitus after a head injury. You have tinnitus along with any of the following: Dizziness. Nausea and vomiting. Loss of balance. Sudden, severe headache. Vision changes. Facial weakness or weakness of arms or legs. These symptoms may represent a serious problem that is an emergency. Do not wait to see if the symptoms will go away. Get medical help right away. Call your local emergency services (911 in the [place] Do not drive yourself to the hospital. Summary Tinnitus refers to hearing a sound when there is no actual source for that sound. This is often  described as ringing in the ears. Symptoms may affect only one ear (unilateral) or both ears (bilateral). Use a white noise machine, a humidifier, or other devices to mask the sound of tinnitus. Do not use stimulants, such as nicotine, alcohol, or caffeine. These substances may make tinnitus worse. This information is not intended to replace advice given to you by your health care provider. Make sure you discuss any questions you have with your health care provider. Document Revised: 07/22/2020 Document Reviewed: 07/22/2020 Elsevier Patient Education  2023 [employer].

## 2022-12-26 ENCOUNTER — Telehealth: Payer: Self-pay | Admitting: Neurology

## 2022-12-26 NOTE — Telephone Encounter (Signed)
Since saw me last Friday for his tinnitus.  He is already been to ear nose and throat and saw the physician and audiologist.    He is already been seen by ENT and diagnosed with hearing loss.  I actually called his audiologist and spoke with her on the phone, Gaetana Michaelis, who diagnosed him with hearing loss and stated that it is likely causing his tinnitus and that both him and Dr. Clovis Cao  both told him that hearing aids would be his best option at this time.  I did tell patient I would call and discussed with them and I will let patient know that this would be his best option, he actually did tell me during the appointment that he knew that they both told him that.  He saw our sleep doctor Dr. Vickey Huger multiple times this year and he is also seen my nurse practitioner Amy Lomax, multiple appointments in our practice this year.  He saw Dr. Vickey Huger in September June and April 2023 and he saw my nurse practitioner in April 2023 for headaches.  Pod 4: I tried calling patient and went to voicemail.  Please either call him or send him a MyChart message and let him know that I spoke to ENT who said that the [facility] center is probably not a good place for him to go apparently them to take insurance and both the physician and the audiologist have both told him that getting hearing aids is his best option which may help with the tinnitus and with his hearing loss.  Please stressed that he should go and get hearing aids he can just call the office and they will set him up.

## 2022-12-28 ENCOUNTER — Telehealth: Payer: Self-pay | Admitting: *Deleted

## 2022-12-28 ENCOUNTER — Telehealth: Payer: Self-pay | Admitting: Neurology

## 2022-12-28 DIAGNOSIS — G4734 Idiopathic sleep related nonobstructive alveolar hypoventilation: Secondary | ICD-10-CM

## 2022-12-28 DIAGNOSIS — R519 Headache, unspecified: Secondary | ICD-10-CM

## 2022-12-28 NOTE — Telephone Encounter (Signed)
I called the patient and discussed Dr. Trevor Mace message below with him.  Patient verbalized understanding and states he will call the ENT office and asked to schedule an appointment with the audiologist.  He asked if he also needed a hearing test.  I advised I did not see an instruction for that below and the audiologist should be able to let him know that one is required before he can get hearing aids.

## 2022-12-28 NOTE — Telephone Encounter (Signed)
Pt called wanting to speak to RN again because he has one more question to ask regarding his cpap. Please advise.

## 2022-12-28 NOTE — Telephone Encounter (Signed)
I spoke with the patient.  He will be on the lookout for a call from [facility] regarding scheduling pulse oximetry test.  He does understand that he needs to do this test while he is wearing CPAP overnight.  Patient mentioned that he is currently recovering from a respiratory infection and is on an antibiotic.  He has been off of his CPAP for a few days due to nasal congestion and preferring not to breathe out of mouth instead while sleeping, but has let Dr. Vickey Huger know.  He will continue his antibiotic and will follow-up with primary care if he does not improve.  I asked him to either give [place] a call or call [facility] if he does not hear from them by Wednesday or Thursday of this week.  He verbalized appreciation for the call.

## 2022-12-28 NOTE — Telephone Encounter (Signed)
ONO order placed with comment written to indicate should wear CPAP during test. Will send to [facility], patient's DME company. Need MD signature on order.

## 2022-12-28 NOTE — Telephone Encounter (Signed)
medicare/[employer] sup [employer] sent to GI [phone] 

## 2022-12-28 NOTE — Telephone Encounter (Signed)
Order has been faxed to [facility]. Received a receipt of confirmation.

## 2022-12-28 NOTE — Telephone Encounter (Signed)
Spoke with patient. His questions were answered. He thought I had given him a phone number. I had not. He verbalized appreciation for the call back.

## 2022-12-28 NOTE — Telephone Encounter (Signed)
-----   Message from Aaron Fret, MD sent at 12/26/2022  1:12 PM EDT ----- Regarding: order ONO Has improved his headaches.  However he still getting morning headaches.  He did have desaturation on sleep study.  I would like to order an overnight oxygenation monitor and have him wear while he also uses his CPAP to see if he still desaturates please order an ONO for him and instruct him to use it with his CPAP and have him call us if he does not hear from the company within a week or 2 to get his own window.

## 2022-12-29 ENCOUNTER — Telehealth: Payer: Self-pay | Admitting: Cardiology

## 2022-12-29 NOTE — Telephone Encounter (Signed)
Patient wants call back to discuss test results. 

## 2022-12-29 NOTE — Telephone Encounter (Signed)
Patient states this started with pain in chest and person doing EKG they told him to go to hospital immediatly.  He went to [facility] and was told to follow up with Doctor and have the stress test. He did this which ruled out issues, so he ask what was the cause of the pain? Is it heart related? And should he follow up with cariology or his PCP.  Please advise

## 2022-12-30 ENCOUNTER — Encounter: Payer: Self-pay | Admitting: Neurology

## 2022-12-30 ENCOUNTER — Ambulatory Visit
Admission: RE | Admit: 2022-12-30 | Discharge: 2022-12-30 | Disposition: A | Discharge status: Home / Self Care | Payer: Medicare Other | Source: Ambulatory Visit | Attending: Neurology | Admitting: Neurology

## 2022-12-30 ENCOUNTER — Telehealth: Payer: Self-pay | Admitting: *Deleted

## 2022-12-30 DIAGNOSIS — H9313 Tinnitus, bilateral: Secondary | ICD-10-CM | POA: Diagnosis not present

## 2022-12-30 DIAGNOSIS — H9193 Unspecified hearing loss, bilateral: Secondary | ICD-10-CM | POA: Diagnosis not present

## 2022-12-30 DIAGNOSIS — H93A9 Pulsatile tinnitus, unspecified ear: Secondary | ICD-10-CM | POA: Diagnosis not present

## 2022-12-30 DIAGNOSIS — G8929 Other chronic pain: Secondary | ICD-10-CM | POA: Diagnosis not present

## 2022-12-30 DIAGNOSIS — R51 Headache with orthostatic component, not elsewhere classified: Secondary | ICD-10-CM

## 2022-12-30 DIAGNOSIS — R519 Headache, unspecified: Secondary | ICD-10-CM

## 2022-12-30 MED ORDER — GADOPICLENOL 0.5 MMOL/ML IV SOLN
9.0000 mL | Freq: Once | INTRAVENOUS | Status: AC | PRN
  Administered 2022-12-30: 9 mL via INTRAVENOUS

## 2022-12-30 NOTE — Telephone Encounter (Signed)
Spoke with patient gave MRI brain results Pt states picked up oxygen reader test today informed patient will call back with those  results

## 2022-12-30 NOTE — Telephone Encounter (Signed)
Rollene Rotunda, MD  Cv Div Nl Triage7 minutes ago (3:29 PM)    There is a very long list of causes of chest pain.  I could see him back next month to explore further but in the meantime I would also suggest that he sees his PCP to see if he thinks there could be another cause like GI.     Returned call to patient and made him aware of the above. Patient verbalized understanding and will call back to schedule after he see's his primary care physician.

## 2022-12-31 ENCOUNTER — Encounter: Payer: Self-pay | Admitting: Family Medicine

## 2022-12-31 NOTE — Telephone Encounter (Signed)
Message is addressed to [employer]. Recommend to stop medication.

## 2023-01-04 NOTE — Telephone Encounter (Signed)
I called [facility]. The test results are currently pending with [facility]. The results will be sent right over to [place] once ready.

## 2023-01-07 ENCOUNTER — Encounter: Payer: Self-pay | Admitting: Neurology

## 2023-01-07 NOTE — Telephone Encounter (Signed)
Called [facility]. ONO results are not back yet and the gentleman who manages this is out with a medical emergency. He will be back in the office on Monday and they will have him follow-up. I updated the patient.

## 2023-01-14 DIAGNOSIS — R0902 Hypoxemia: Secondary | ICD-10-CM | POA: Diagnosis not present

## 2023-01-14 NOTE — Telephone Encounter (Signed)
I do not see results yet. [facility] opens at 830 am.

## 2023-01-14 NOTE — Telephone Encounter (Signed)
I called [facility] and spoke with Darrell. He will fax the results over to [phone] at my attention.

## 2023-01-23 ENCOUNTER — Other Ambulatory Visit: Payer: Self-pay | Admitting: Neurology

## 2023-01-26 NOTE — Telephone Encounter (Signed)
From Amy NP:   Shawnie Dapper, NP  You18 minutes ago (12:46 PM)    Yes. Please let him know that his oxygen level looks good when using his CPAP. Remind him to use therapy nightly during sleep.

## 2023-02-04 NOTE — Telephone Encounter (Signed)
Spoke to patient had questions about CT scan in July wanted to make  sure that   Dr Lucia Gaskins still wanted him to go to CT scan due to the fact he already had his MRI in May informed pt  per Dr Lucia Gaskins wanted him to continue with testing so she is able to get a complete diagnosis. Pt had another question about billing informed pt he can call his insurance to ask about coverage for CT scan  Pt expressed understanding and thanked me for calling

## 2023-02-10 ENCOUNTER — Ambulatory Visit
Admission: RE | Admit: 2023-02-10 | Discharge: 2023-02-10 | Disposition: A | Discharge status: Home / Self Care | Payer: Medicare Other | Source: Ambulatory Visit | Attending: Neurology | Admitting: Neurology

## 2023-02-10 DIAGNOSIS — H9193 Unspecified hearing loss, bilateral: Secondary | ICD-10-CM

## 2023-02-10 DIAGNOSIS — R51 Headache with orthostatic component, not elsewhere classified: Secondary | ICD-10-CM

## 2023-02-10 DIAGNOSIS — H93A9 Pulsatile tinnitus, unspecified ear: Secondary | ICD-10-CM

## 2023-02-10 DIAGNOSIS — R519 Headache, unspecified: Secondary | ICD-10-CM

## 2023-02-10 DIAGNOSIS — H9313 Tinnitus, bilateral: Secondary | ICD-10-CM

## 2023-02-10 DIAGNOSIS — G8929 Other chronic pain: Secondary | ICD-10-CM

## 2023-02-10 MED ORDER — IOPAMIDOL (ISOVUE-370) INJECTION 76%
75.0000 mL | Freq: Once | INTRAVENOUS | Status: AC | PRN
  Administered 2023-02-10: 75 mL via INTRAVENOUS

## 2023-02-15 DIAGNOSIS — H43812 Vitreous degeneration, left eye: Secondary | ICD-10-CM | POA: Diagnosis not present

## 2023-02-15 DIAGNOSIS — H2513 Age-related nuclear cataract, bilateral: Secondary | ICD-10-CM | POA: Diagnosis not present

## 2023-02-15 DIAGNOSIS — E119 Type 2 diabetes mellitus without complications: Secondary | ICD-10-CM | POA: Diagnosis not present

## 2023-02-15 LAB — HM DIABETES EYE EXAM

## 2023-02-21 ENCOUNTER — Encounter: Payer: Self-pay | Admitting: Neurology

## 2023-02-23 ENCOUNTER — Encounter: Payer: Self-pay | Admitting: Neurology

## 2023-03-08 ENCOUNTER — Other Ambulatory Visit: Payer: Medicare Other

## 2023-03-30 ENCOUNTER — Ambulatory Visit: Payer: Medicare Other | Admitting: Emergency Medicine

## 2023-04-05 ENCOUNTER — Encounter: Payer: Self-pay | Admitting: Neurology

## 2023-04-06 ENCOUNTER — Encounter: Payer: Self-pay | Admitting: Emergency Medicine

## 2023-04-06 ENCOUNTER — Ambulatory Visit (INDEPENDENT_AMBULATORY_CARE_PROVIDER_SITE_OTHER): Payer: Medicare Other | Admitting: Emergency Medicine

## 2023-04-06 VITALS — BP 126/84 | HR 75 | Temp 97.8°F | Ht 66.0 in | Wt 178.2 lb

## 2023-04-06 DIAGNOSIS — K2100 Gastro-esophageal reflux disease with esophagitis, without bleeding: Secondary | ICD-10-CM

## 2023-04-06 DIAGNOSIS — K219 Gastro-esophageal reflux disease without esophagitis: Secondary | ICD-10-CM | POA: Diagnosis not present

## 2023-04-06 DIAGNOSIS — S39012A Strain of muscle, fascia and tendon of lower back, initial encounter: Secondary | ICD-10-CM | POA: Diagnosis not present

## 2023-04-06 DIAGNOSIS — J340 Abscess, furuncle and carbuncle of nose: Secondary | ICD-10-CM

## 2023-04-06 DIAGNOSIS — G4733 Obstructive sleep apnea (adult) (pediatric): Secondary | ICD-10-CM | POA: Diagnosis not present

## 2023-04-06 MED ORDER — MUPIROCIN 2 % EX OINT: 1.0000 | TOPICAL_OINTMENT | Freq: Two times a day (BID) | CUTANEOUS | 0 refills | Status: DC

## 2023-04-06 MED ORDER — PANTOPRAZOLE SODIUM 40 MG PO TBEC: 40.0000 mg | DELAYED_RELEASE_TABLET | Freq: Every day | ORAL | 3 refills | Status: DC

## 2023-04-06 MED ORDER — CYCLOBENZAPRINE HCL 10 MG PO TABS: 10.0000 mg | ORAL_TABLET | Freq: Every day | ORAL | 0 refills | Status: DC

## 2023-04-06 NOTE — Assessment & Plan Note (Signed)
Irritated small ulcer.  Recommend to use Bactroban ointment twice a day for the next 7 days

## 2023-04-06 NOTE — Assessment & Plan Note (Signed)
Acute and affecting quality of life. Mechanical in nature. No red flag signs or symptoms. Recommend Flexeril 10 mg at bedtime. Tylenol for pain as needed.

## 2023-04-06 NOTE — Patient Instructions (Signed)
Lumbar Strain A lumbar strain, which is sometimes called a low-back strain, is a stretch or tear in a muscle or tendons in the lower back (lumbar spine). Tendons are the strong cords of tissue that attach muscle to bone. This type of injury occurs when muscles or tendons are torn or are stretched beyond their limits. Lumbar strains can range from mild to severe. Mild strains may involve stretching a muscle or tendon without tearing it. These may heal in 1-2 weeks. More severe strains involve tearing of muscle fibers or tendons. These will cause more pain and may take 6-8 weeks to heal. What are the causes? This condition may be caused by: Trauma, such as a fall or a hit to the body. Twisting or overstretching the back. This may result from doing activities that take a lot of energy, such as lifting heavy objects. What increases the risk? A lumbar strain is more common in: Athletes. People with obesity. People who do repeated lifting, bending, or other movements that involve their back. What are the signs or symptoms? Symptoms of this condition may include: Sharp or dull pain in the lower back that does not go away. The pain may spread to the buttocks. Stiffness or limited range of motion. Sudden muscle tightening (spasms). How is this diagnosed? This condition may be diagnosed based on: Your symptoms. Your medical history. A physical exam. Imaging tests, such as: X-rays. MRI. How is this treated? Treatment for this condition may include: Rest. Applying heat and cold to the affected area. Over-the-counter medicines to help with pain and inflammation, such as NSAIDs. Prescription medicine for pain or to relax the muscles. These may be needed for a short time. Physical therapy exercises to improve movement and strength. Follow these instructions at home: Managing pain, stiffness, and swelling     If told, put ice on the injured area during the first 24 hours after your injury. Put  ice in a plastic bag. Place a towel between your skin and the bag. Leave the ice on for 20 minutes, 2-3 times a day. If told, apply heat to the affected area as often as told by your health care provider. Use the heat source that your health care provider recommends, such as a moist heat pack or a heating pad. Place a towel between your skin and the heat source. Leave the heat on for 20-30 minutes. Remove the heat if your skin turns bright red. This is especially important if you are unable to feel pain, heat, or cold. You have a greater risk of getting burned. If your skin turns bright red, remove the ice or heat right away to prevent skin damage. The risk of damage is higher if you cannot feel pain, heat, or cold. Activity Rest and return to your normal activities as told by your health care provider. Ask your health care provider what activities are safe for you. Do exercises as told by your health care provider. General instructions Take over-the-counter and prescription medicines only as told by your health care provider. Ask your health care provider if the medicine prescribed to you: Requires you to avoid driving or using machinery. Can cause constipation. You may need to take these actions to prevent or treat constipation: Drink enough fluid to keep your urine pale yellow. Take over-the-counter or prescription medicines. Eat foods that are high in fiber, such as beans, whole grains, and fresh fruits and vegetables. Limit foods that are high in fat and processed sugars, such as fried or  sweet foods. Do not use any products that contain nicotine or tobacco. These products include cigarettes, chewing tobacco, and vaping devices, such as e-cigarettes. If you need help quitting, ask your health care provider. How is this prevented? To prevent a future low-back injury: Always warm up properly before physical activity or sports. Cool down and stretch after being active. Use correct form  when playing sports and lifting heavy objects. Bend your knees before you lift heavy objects. Use good posture when sitting and standing. Stay physically fit and keep a healthy weight. Do at least 150 minutes of moderate intensity exercise each week, such as brisk walking or water aerobics. Do strength exercises at least 2 times each week. Contact a health care provider if: Your back pain does not improve after several weeks of treatment. Your symptoms get worse. You have a fever. Get help right away if: Your back pain is severe. You are unable to stand or walk. You develop pain in your legs. You have weakness in your buttocks or legs. You have trouble controlling when you urinate or when you have a bowel movement. You have frequent, painful, or bloody urination. This information is not intended to replace advice given to you by your health care provider. Make sure you discuss any questions you have with your health care provider. Document Revised: 12/21/2022 Document Reviewed: 03/10/2022 Elsevier Patient Education  2024 [employer].

## 2023-04-06 NOTE — Assessment & Plan Note (Signed)
Possibly triggered by CPAP therapy Recommend to start Protonix 40 mg daily Will need upper endoscopy and GI referral Advised to follow-up with GI once he establishes care in [place]

## 2023-04-06 NOTE — Assessment & Plan Note (Signed)
Stable on CPAP treatment.

## 2023-04-06 NOTE — Progress Notes (Signed)
Aaron [name] 72 y.o.   Chief Complaint  Patient presents with   Back Pain    Right sided back pain, 3-4 weeks , patient states the pain is achy not radiating   Patient wants to see Cardiology again for a f/u chest pain, cardiology thinks It may be GI related.     HISTORY OF PRESENT ILLNESS: This is a 72 y.o. male moving soon to [place] so this is probably our last office visit. Today complaining of the following things: 1.  3 to 4-week history of right lumbar pain.  Most likely musculoskeletal.  Denies urinary symptoms.  No pain radiation.  No associated symptoms. 2.  History of OSA on CPAP with occasional reflux symptoms 3.  Nose ulceration with dryness and discomfort No other complaints or medical concerns today.  Back Pain Pertinent negatives include no abdominal pain, chest pain, dysuria, fever or headaches.     Prior to Admission medications   Medication Sig Start Date End Date Taking? Authorizing Provider  cyclobenzaprine (FLEXERIL) 10 MG tablet Take 1 tablet (10 mg total) by mouth at bedtime. 04/06/23  Yes Georgina Quint, MD  metFORMIN (GLUCOPHAGE-XR) 500 MG 24 hr tablet TAKE 1 TABLET BY MOUTH DAILY AT 8PM 11/15/22  Yes , Eilleen Kempf, MD  Multiple Vitamin (MULTI-VITAMIN PO) Take by mouth daily.   Yes [provider]  mupirocin ointment (BACTROBAN) 2 % Place 1 Application into the nose 2 (two) times daily. 04/06/23  Yes , Eilleen Kempf, MD  Omega-3 Fatty Acids (FISH OIL PO) Take 800 mg by mouth daily.   Yes [provider]  pantoprazole (PROTONIX) 40 MG tablet Take 1 tablet (40 mg total) by mouth daily. 04/06/23  Yes , Eilleen Kempf, MD  rosuvastatin (CRESTOR) 10 MG tablet Take 1 tablet (10 mg total) by mouth daily. 11/20/22  Yes Rollene Rotunda, MD  vitamin B-12 (CYANOCOBALAMIN) 500 MCG tablet Take 500 mcg by mouth daily.   Yes [provider]  verapamil (CALAN-SR) 120 MG CR tablet TAKE 1 TABLET(120 MG) BY MOUTH AT  BEDTIME Patient not taking: Reported on 04/06/2023 01/27/23   Dohmeier, Porfirio Mylar, MD    Allergies  Allergen Reactions   Erythromycin Itching   Levaquin [Levofloxacin In D5w]     Light sensitivity     Patient Active Problem List   Diagnosis Date Noted   Polyarthritis of multiple sites 05/07/2022   Chronic tension-type headache, not intractable 05/07/2022   OSA on CPAP 05/07/2022   Diverticulosis 02/09/2022   Suspected sleep apnea 02/09/2022   Aortic atherosclerosis (HCC) 02/09/2022   Non-restorative sleep 12/23/2021   Sleep related bruxism 12/23/2021   Morning headache 12/23/2021   Chronic fatigue 12/23/2021   Sore of lip 12/02/2021   Tinnitus of both ears 12/02/2021   Right flank pain 08/15/2021   Diabetes (HCC) 03/01/2021   Chronic anxiety 01/08/2021   Raynaud's phenomenon without gangrene 01/08/2021   Other psoriasis 10/01/2020   Coronary artery calcification 06/12/2020   Hypertension associated with diabetes (HCC) 05/29/2019   Gastroesophageal reflux disease without esophagitis 03/13/2017   Dyslipidemia associated with type 2 diabetes mellitus (HCC) 03/13/2017    Past Medical History:  Diagnosis Date   Diabetes mellitus without complication (HCC)    GERD (gastroesophageal reflux disease)    Hyperlipidemia    Hypertension    Psoriasis    Sleep apnea    CPAP    History reviewed. No pertinent surgical history.  Social History   Socioeconomic History   Marital status: Married  Spouse name: Raynelle Fanning   Number of children: 0   Years of education: Not on file   Highest education level: High school graduate  Occupational History   Occupation: Airline pilot, retired   Tobacco Use   Smoking status: Former    Current packs/day: 0.00    Types: Cigarettes    Quit date: 1985    Years since quitting: 39.6   Smokeless tobacco: Never  Vaping Use   Vaping status: Never Used  Substance and Sexual Activity   Alcohol use: Not Currently    Alcohol/week: 7.0 standard drinks of  alcohol    Types: 7 Cans of beer per week   Drug use: No   Sexual activity: Yes  Other Topics Concern   Not on file  Social History Narrative   Marital status:  Married.          Children:   none      Lives:      Employment:  Airline pilot      Tobacco:  none      Alcohol:  About 7 drinks per week      Drugs:   none      Exercise:      Seatbelt:      Education: [employer].      Lives at home with wife   Right handed   Caffeine: 2 cups/day   Social Determinants of Health   Financial Resource Strain: Low Risk  (06/24/2022)   Overall Financial Resource Strain (CARDIA)    Difficulty of Paying Living Expenses: Not hard at all  Food Insecurity: No Food Insecurity (06/24/2022)   Hunger Vital Sign    Worried About Running Out of Food in the Last Year: Never true    Ran Out of Food in the Last Year: Never true  Transportation Needs: No Transportation Needs (06/24/2022)   PRAPARE - Administrator, Civil Service (Medical): No    Lack of Transportation (Non-Medical): No  Physical Activity: Sufficiently Active (06/24/2022)   Exercise Vital Sign    Days of Exercise per Week: 5 days    Minutes of Exercise per Session: 30 min  Stress: No Stress Concern Present (06/24/2022)   [employer] of Occupational Health - Occupational Stress Questionnaire    Feeling of Stress : Not at all  Social Connections: Unknown (06/24/2022)   Social Connection and Isolation Panel [NHANES]    Frequency of Communication with Friends and Family: More than three times a week    Frequency of Social Gatherings with Friends and Family: More than three times a week    Attends Religious Services: More than 4 times per year    Active Member of [employer] or Organizations: Yes    Attends Engineer, structural: More than 4 times per year    Marital Status: Not on file  Intimate Partner Violence: Not At Risk (06/24/2022)   Humiliation, Afraid, Rape, and Kick questionnaire    Fear of Current or  Ex-Partner: No    Emotionally Abused: No    Physically Abused: No    Sexually Abused: No    Family History  Problem Relation Age of Onset   Cancer Mother        breast ca spread to bone   Cancer Father 61       brain cancer   Migraines Neg Hx    Headache Neg Hx      Review of Systems  Constitutional: Negative.  Negative for chills and fever.  HENT:  Negative.  Negative for congestion and sore throat.   Respiratory: Negative.  Negative for cough and shortness of breath.   Cardiovascular: Negative.  Negative for chest pain and palpitations.  Gastrointestinal:  Negative for abdominal pain, nausea and vomiting.  Genitourinary: Negative.  Negative for dysuria and hematuria.  Musculoskeletal:  Positive for back pain.  Skin: Negative.  Negative for rash.  Neurological: Negative.  Negative for dizziness and headaches.    Vitals:   04/06/23 0958  BP: 126/84  Pulse: 75  Temp: 97.8 F (36.6 C)  SpO2: 99%    Physical Exam Vitals reviewed.  Constitutional:      Appearance: Normal appearance.  HENT:     Head: Normocephalic.     Nose:     Comments: Small ulceration to right inside naris    Mouth/Throat:     Mouth: Mucous membranes are moist.     Pharynx: Oropharynx is clear.  Eyes:     Extraocular Movements: Extraocular movements intact.     Pupils: Pupils are equal, round, and reactive to light.  Cardiovascular:     Rate and Rhythm: Normal rate and regular rhythm.     Heart sounds: Normal heart sounds.  Pulmonary:     Effort: Pulmonary effort is normal.     Breath sounds: Normal breath sounds.  Abdominal:     Palpations: Abdomen is soft.     Tenderness: There is no abdominal tenderness. There is no right CVA tenderness or left CVA tenderness.  Musculoskeletal:     Cervical back: No tenderness.  Lymphadenopathy:     Cervical: No cervical adenopathy.  Skin:    General: Skin is warm and dry.     Capillary Refill: Capillary refill takes less than 2 seconds.   Neurological:     General: No focal deficit present.     Mental Status: He is alert and oriented to person, place, and time.  Psychiatric:        Mood and Affect: Mood normal.        Behavior: Behavior normal.      ASSESSMENT & PLAN: A total of 44 minutes was spent with the patient and counseling/coordination of care regarding preparing for this visit, review of most recent office visit notes, review of multiple chronic medical conditions and their management, review of all medications and changes made, education on nutrition, prognosis, documentation and need for follow-up  Problem List Items Addressed This Visit       Respiratory   OSA on CPAP    Stable on CPAP treatment        Digestive   Gastroesophageal reflux disease without esophagitis    Possibly triggered by CPAP therapy Recommend to start Protonix 40 mg daily Will need upper endoscopy and GI referral Advised to follow-up with GI once he establishes care in [place]      Relevant Medications   pantoprazole (PROTONIX) 40 MG tablet   Gastroesophageal reflux disease with esophagitis without hemorrhage - Primary   Relevant Medications   pantoprazole (PROTONIX) 40 MG tablet     Musculoskeletal and Integument   Strain of lumbar region    Acute and affecting quality of life. Mechanical in nature. No red flag signs or symptoms. Recommend Flexeril 10 mg at bedtime. Tylenol for pain as needed.      Relevant Medications   cyclobenzaprine (FLEXERIL) 10 MG tablet     Other   Ulcer of nose    Irritated small ulcer.  Recommend to use Bactroban ointment twice a  day for the next 7 days      Relevant Medications   mupirocin ointment (BACTROBAN) 2 %   Patient Instructions  Lumbar Strain A lumbar strain, which is sometimes called a low-back strain, is a stretch or tear in a muscle or tendons in the lower back (lumbar spine). Tendons are the strong cords of tissue that attach muscle to bone. This type of injury  occurs when muscles or tendons are torn or are stretched beyond their limits. Lumbar strains can range from mild to severe. Mild strains may involve stretching a muscle or tendon without tearing it. These may heal in 1-2 weeks. More severe strains involve tearing of muscle fibers or tendons. These will cause more pain and may take 6-8 weeks to heal. What are the causes? This condition may be caused by: Trauma, such as a fall or a hit to the body. Twisting or overstretching the back. This may result from doing activities that take a lot of energy, such as lifting heavy objects. What increases the risk? A lumbar strain is more common in: Athletes. People with obesity. People who do repeated lifting, bending, or other movements that involve their back. What are the signs or symptoms? Symptoms of this condition may include: Sharp or dull pain in the lower back that does not go away. The pain may spread to the buttocks. Stiffness or limited range of motion. Sudden muscle tightening (spasms). How is this diagnosed? This condition may be diagnosed based on: Your symptoms. Your medical history. A physical exam. Imaging tests, such as: X-rays. MRI. How is this treated? Treatment for this condition may include: Rest. Applying heat and cold to the affected area. Over-the-counter medicines to help with pain and inflammation, such as NSAIDs. Prescription medicine for pain or to relax the muscles. These may be needed for a short time. Physical therapy exercises to improve movement and strength. Follow these instructions at home: Managing pain, stiffness, and swelling     If told, put ice on the injured area during the first 24 hours after your injury. Put ice in a plastic bag. Place a towel between your skin and the bag. Leave the ice on for 20 minutes, 2-3 times a day. If told, apply heat to the affected area as often as told by your health care provider. Use the heat source that your  health care provider recommends, such as a moist heat pack or a heating pad. Place a towel between your skin and the heat source. Leave the heat on for 20-30 minutes. Remove the heat if your skin turns bright red. This is especially important if you are unable to feel pain, heat, or cold. You have a greater risk of getting burned. If your skin turns bright red, remove the ice or heat right away to prevent skin damage. The risk of damage is higher if you cannot feel pain, heat, or cold. Activity Rest and return to your normal activities as told by your health care provider. Ask your health care provider what activities are safe for you. Do exercises as told by your health care provider. General instructions Take over-the-counter and prescription medicines only as told by your health care provider. Ask your health care provider if the medicine prescribed to you: Requires you to avoid driving or using machinery. Can cause constipation. You may need to take these actions to prevent or treat constipation: Drink enough fluid to keep your urine pale yellow. Take over-the-counter or prescription medicines. Eat foods that are high  in fiber, such as beans, whole grains, and fresh fruits and vegetables. Limit foods that are high in fat and processed sugars, such as fried or sweet foods. Do not use any products that contain nicotine or tobacco. These products include cigarettes, chewing tobacco, and vaping devices, such as e-cigarettes. If you need help quitting, ask your health care provider. How is this prevented? To prevent a future low-back injury: Always warm up properly before physical activity or sports. Cool down and stretch after being active. Use correct form when playing sports and lifting heavy objects. Bend your knees before you lift heavy objects. Use good posture when sitting and standing. Stay physically fit and keep a healthy weight. Do at least 150 minutes of moderate intensity  exercise each week, such as brisk walking or water aerobics. Do strength exercises at least 2 times each week. Contact a health care provider if: Your back pain does not improve after several weeks of treatment. Your symptoms get worse. You have a fever. Get help right away if: Your back pain is severe. You are unable to stand or walk. You develop pain in your legs. You have weakness in your buttocks or legs. You have trouble controlling when you urinate or when you have a bowel movement. You have frequent, painful, or bloody urination. This information is not intended to replace advice given to you by your health care provider. Make sure you discuss any questions you have with your health care provider. Document Revised: 12/21/2022 Document Reviewed: 03/10/2022 Elsevier Patient Education  2024 [employer]      Edwina Barth, MD  [facility]

## 2023-04-07 ENCOUNTER — Encounter: Payer: Self-pay | Admitting: Family Medicine

## 2023-04-07 ENCOUNTER — Ambulatory Visit (INDEPENDENT_AMBULATORY_CARE_PROVIDER_SITE_OTHER): Payer: Medicare Other | Admitting: Family Medicine

## 2023-04-07 ENCOUNTER — Encounter: Payer: Self-pay | Admitting: Emergency Medicine

## 2023-04-07 VITALS — BP 120/81 | HR 82 | Ht 66.0 in | Wt 176.0 lb

## 2023-04-07 DIAGNOSIS — G4733 Obstructive sleep apnea (adult) (pediatric): Secondary | ICD-10-CM

## 2023-04-07 DIAGNOSIS — G44229 Chronic tension-type headache, not intractable: Secondary | ICD-10-CM | POA: Diagnosis not present

## 2023-04-07 NOTE — Progress Notes (Signed)
Chief Complaint  Patient presents with   Room 2    Pt is here Alone. Pt states that he had a pain in his chest 3-4 months ago. Pt wants to discuss his chest pain and if it is causing him chest pain. Pt wants to discuss Verapamil.     HISTORY OF PRESENT ILLNESS:  04/07/23 ALL:  He presents, today, for follow up for migraines and OSA on CPAP. He reports doing well on therapy. He is using therapy nightly for about 6 hours. He has stopped using water in chamber and feels he is tolerating therapy a little better. He does have some dry mouth. Headaches are stable. Verapamil did not help. He discontinued it 2 months ago. Tinnitus continues. He does not feel he is ready for hearing aids.  He is moving to [place]. He has family in the area.     12/10/2021 ALL: Annette Stable returns for follow up for headaches. He was last seen 12/2020 and doing fairly well. We discussed starting topiramate but he was hesitant. PCP had started Ambien around that time. He was unable to tolerate and discontinued. He reports getting Covid 02/2021. He was pretty ill for about 2 months. Since, headaches have worsened. He has a daily headache. Pain is described as a pressure sensation. No light sensitivity. Possible sound sensitivity with bad headaches. No nausea or worsening with activity. He wakes with headache every day. Headache seems to be better during the day. He feels that he is able to distract himself. He takes Excedrin 2 tabs 2-3 times a month for severe headaches. He drinks about 40-60 ounces of water daily.   He reports tinnitus started about 2 months ago. He was seen by ENT. He has concerns of possible sleep apnea. ENT and audiology and diagnosed with mild hearing loss, otherwise normal evaluation. He purchased an [employer] continuous pulse oximeter.  that indicated multiple desaturations (nadir 84%). It is unclear how long O2 remains below 88% but appears to be around 10 minutes. PCP sent sleep referral 12/03/2020. He does  not snore. He denies significant daytime sleepiness. No family history of sleep apnea. No obvious concerns of apneic events. He is retired. He does chores around the home most days. He may nap once a day for about 30-60 minutes. He sleeps fairly well at night. It may take a little while to get to sleep (about an hour) but he sleeps well when he does. Usually bedtime is around 12am and wakes around 8-9am. He does not feel refreshed when he wakes. He wakes with dry mouth on occasion.   01/09/2021 ALL:  He returns for follow up for headaches. We started propranolol in 10/2020. He was concerned about increased sleepiness and dose was switched to ER. He called the office yesterday with concerns of cold hands and feet and reported PCP wanted him to stop propranolol for concerns of Raynaud's. Headaches were unchanged and no improvement noted. He has had more anxiety. PCP recommended he see psychiatry. He reports that his wife feels that he is a hypochondriac. He was recently started on Ambien 5mg  due to insomnia. He has tolerated well for the past month. It is unclear if it is helping. He feels that he has trouble remembering things over the past few months. He has been seen by rheumatology, ophthalmology, cardiology for various concerns. All workup has been unremarkable.   10/30/2020 ALL:  VENNIE GIAMANCO is a 72 y.o. male here today for follow up for headaches. He was  seen by Dr Lucia Gaskins  07/2019 and 09/2019. MRI was normal. Headaches were thought to be from clenching. He had used mouth guard and flexeril which helped. Sleep study also recommended but was not pursued.  He had a CT with PCP 10/2020 following an accident at home where he hit his head. CT was normal. He reports that headaches continue. Headache is constant. Pain is usually on the top of his head. No migraine symptoms. Relieved when he pushes on temples. His wife says he does not snore. He has lost 40 pounds. He takes meloxicam daily for back pain. He has  taken Excedrin PM in the past but not since starting meloxicam. He has not been on prevention medications. He is followed by PCP for intermittent dizziness. Previous cardiology workup normal.   HISTORY (copied from Dr Trevor Mace previous note)  Interval history 07/11/2020: He saw the dentist and he is clenching and grinding at night, he is going to get a mouthguard. No changes since he has been seen. He is still waking up with the headache. Will add Flexeril at bedtime for teeth clenching. We discussed a plan.   HPI:  LYTLE MARINACCI is a 71 y.o. male here as requested by Georgina Quint, * for persistent headache as well as headaches and myalgias.  I reviewed Dr. Latrelle Dodrill notes.  Patient has muscle pain that started 2 weeks prior to last appointment which was May 29, 2019.  He has a past medical history of diabetes on Metformin and glipizide, hypertension on Norvasc 5 mg, hyperlipidemia on a statin.  Symptoms included upper and lower extremity muscle pain, no injuries, and "stress headaches" 3-4 times weekly this started in January.  No history of migraine headaches.  No other associated symptoms, no abdominal pain, chest pain, fever, nausea, rash, shortness of breath or vomiting, coughing, dizziness, fever.  Examination was normal including physical examination and neurologic exam was nonfocal.  CBC 1 month ago showed elevated white blood cells.  Hemoglobin A1c 6.48-month ago, 6 months ago was 8.7.  CMP unremarkable.   He is here alone. One day a few months ago he had a headache. It was consistent, about March or April. He went to urgent care, neg for covid19, he was started on fioricet and it did not help. No hx of migraines. Excedrin did not help. Feels like he is wearing a hat that is on tight. It comes and goes. He notices it in the morning it gets better when he gets to work. Or with laying in bed. No throbbing. He rarely had headaches in the past in the temples but this is different. Can  last for hours. Hasn't progressed or improved. Coffee maybe helps. Unknown triggers. Annoying not excruciating. No vision problems or vision changes. No hearing changes. He gets muscle aches in the arms, no weakness, started prior to the headache, he has some cervical neck pain, no radicular symptoms, he has low back pain that is long-term and stable. He has some numbness and tingling in his feet but not frequently. He denies snoring or excessive fatigue during the day, he doesn't complain about not feeling refreshed. No associated light or sound sensitivity, no migraines. He stopped the statin in September still symptomatic. No fevers. Some stiffness in the neck but no deficits of range of motion.    Reviewed notes, labs and imaging from outside physicians, which showed:    Reviewed XR cervical spine images and agree: INGS: Vertebral body height and alignment are maintained. Loss of  disc space height and endplate spurring are seen at C5-6. The C7-T1 level is not visualized has no swimmer's view is provided.   IMPRESSION: C5-6 degenerative disc disease.   REVIEW OF SYSTEMS: Out of a complete 14 system review of symptoms, the patient complains only of the following symptoms, headaches, dizziness and all other reviewed systems are negative.   ESS: 5/24   ALLERGIES: Allergies  Allergen Reactions   Erythromycin Itching   Levaquin [Levofloxacin In D5w]     Light sensitivity      HOME MEDICATIONS: Outpatient Medications Prior to Visit  Medication Sig Dispense Refill   metFORMIN (GLUCOPHAGE-XR) 500 MG 24 hr tablet TAKE 1 TABLET BY MOUTH DAILY AT 8PM 180 tablet 0   Multiple Vitamin (MULTI-VITAMIN PO) Take by mouth daily.     Omega-3 Fatty Acids (FISH OIL PO) Take 800 mg by mouth daily.     rosuvastatin (CRESTOR) 10 MG tablet Take 1 tablet (10 mg total) by mouth daily. 90 tablet 3   cyclobenzaprine (FLEXERIL) 10 MG tablet Take 1 tablet (10 mg total) by mouth at bedtime. (Patient not  taking: Reported on 04/07/2023) 30 tablet 0   mupirocin ointment (BACTROBAN) 2 % Place 1 Application into the nose 2 (two) times daily. (Patient not taking: Reported on 04/07/2023) 22 g 0   pantoprazole (PROTONIX) 40 MG tablet Take 1 tablet (40 mg total) by mouth daily. (Patient not taking: Reported on 04/07/2023) 30 tablet 3   verapamil (CALAN-SR) 120 MG CR tablet TAKE 1 TABLET(120 MG) BY MOUTH AT BEDTIME (Patient not taking: Reported on 04/06/2023) 30 tablet 2   vitamin B-12 (CYANOCOBALAMIN) 500 MCG tablet Take 500 mcg by mouth daily. (Patient not taking: Reported on 04/07/2023)     No facility-administered medications prior to visit.     PAST MEDICAL HISTORY: Past Medical History:  Diagnosis Date   Diabetes mellitus without complication (HCC)    GERD (gastroesophageal reflux disease)    Hyperlipidemia    Hypertension    Psoriasis    Sleep apnea    CPAP     PAST SURGICAL HISTORY: Past Surgical History:  Procedure Laterality Date   TONSILLECTOMY AND ADENOIDECTOMY     When he was 72 years old     FAMILY HISTORY: Family History  Problem Relation Age of Onset   Cancer Mother        breast ca spread to bone   Cancer Father 53       brain cancer   Migraines Neg Hx    Headache Neg Hx      SOCIAL HISTORY: Social History   Socioeconomic History   Marital status: Married    Spouse name: Raynelle Fanning   Number of children: 0   Years of education: Not on file   Highest education level: High school graduate  Occupational History   Occupation: Airline pilot, retired   Tobacco Use   Smoking status: Former    Current packs/day: 0.00    Types: Cigarettes    Quit date: 1985    Years since quitting: 39.6   Smokeless tobacco: Never  Vaping Use   Vaping status: Never Used  Substance and Sexual Activity   Alcohol use: Not Currently    Alcohol/week: 7.0 standard drinks of alcohol    Types: 7 Cans of beer per week   Drug use: No   Sexual activity: Yes  Other Topics Concern   Not on file  Social  History Narrative   Marital status:  Married.  Children:   none      Lives:      Employment:  Airline pilot      Tobacco:  none      Alcohol:  About 7 drinks per week      Drugs:   none      Exercise:      Seatbelt:      Education: [employer].      Lives at home with wife   Right handed   Caffeine: 2 cups/day   Social Determinants of Health   Financial Resource Strain: Low Risk  (06/24/2022)   Overall Financial Resource Strain (CARDIA)    Difficulty of Paying Living Expenses: Not hard at all  Food Insecurity: No Food Insecurity (06/24/2022)   Hunger Vital Sign    Worried About Running Out of Food in the Last Year: Never true    Ran Out of Food in the Last Year: Never true  Transportation Needs: No Transportation Needs (06/24/2022)   PRAPARE - Administrator, Civil Service (Medical): No    Lack of Transportation (Non-Medical): No  Physical Activity: Sufficiently Active (06/24/2022)   Exercise Vital Sign    Days of Exercise per Week: 5 days    Minutes of Exercise per Session: 30 min  Stress: No Stress Concern Present (06/24/2022)   [employer] of Occupational Health - Occupational Stress Questionnaire    Feeling of Stress : Not at all  Social Connections: Unknown (06/24/2022)   Social Connection and Isolation Panel [NHANES]    Frequency of Communication with Friends and Family: More than three times a week    Frequency of Social Gatherings with Friends and Family: More than three times a week    Attends Religious Services: More than 4 times per year    Active Member of [employer] or Organizations: Yes    Attends Banker Meetings: More than 4 times per year    Marital Status: Not on file  Intimate Partner Violence: Not At Risk (06/24/2022)   Humiliation, Afraid, Rape, and Kick questionnaire    Fear of Current or Ex-Partner: No    Emotionally Abused: No    Physically Abused: No    Sexually Abused: No      PHYSICAL EXAM  Vitals:    04/07/23 1520  BP: 120/81  Pulse: 82  Weight: 176 lb (79.8 kg)  Height: 5\' 6"  (1.676 m)     Body mass index is 28.41 kg/m.   Generalized: Well developed, in no acute distress  Cardiology: normal rate and rhythm, no murmur auscultated  Respiratory: clear to auscultation bilaterally    Neurological examination  Mentation: Alert oriented to time, place, history taking. Follows all commands speech and language fluent Cranial nerve II-XII: Pupils were equal round reactive to light. Extraocular movements were full, visual field were full on confrontational test. Facial sensation and strength were normal. Head turning and shoulder shrug  were normal and symmetric. Motor: The motor testing reveals 5 over 5 strength of all 4 extremities. Good symmetric motor tone is noted throughout.  Gait and station: Gait is normal.     DIAGNOSTIC DATA (LABS, IMAGING, TESTING) - I reviewed patient records, labs, notes, testing and imaging myself where available.  Lab Results  Component Value Date   WBC 7.8 11/16/2022   HGB 15.6 11/16/2022   HCT 48.4 11/16/2022   MCV 86.1 11/16/2022   PLT 268 11/16/2022      Component Value Date/Time   NA 136 11/16/2022 1837  NA 141 10/15/2020 1325   K 3.9 11/16/2022 1837   CL 104 11/16/2022 1837   CO2 25 11/16/2022 1837   GLUCOSE 106 (H) 11/16/2022 1837   BUN 12 11/16/2022 1837   BUN 13 10/15/2020 1325   CREATININE 0.82 11/16/2022 1837   CREATININE 0.71 04/29/2015 1339   CALCIUM 9.0 11/16/2022 1837   PROT 7.1 11/11/2022 1052   PROT 6.8 10/15/2020 1325   ALBUMIN 4.0 11/11/2022 1052   ALBUMIN 4.2 10/15/2020 1325   AST 15 11/11/2022 1052   ALT 15 11/11/2022 1052   ALKPHOS 72 11/11/2022 1052   BILITOT 0.4 11/11/2022 1052   BILITOT 0.4 10/15/2020 1325   GFRNONAA >60 11/16/2022 1837   GFRNONAA >89 04/29/2015 1339   GFRAA 115 10/15/2020 1325   GFRAA >89 04/29/2015 1339   Lab Results  Component Value Date   CHOL 166 11/11/2022   HDL 39.70  11/11/2022   LDLCALC 87 11/11/2022   TRIG 192.0 (H) 11/11/2022   CHOLHDL 4 11/11/2022   Lab Results  Component Value Date   HGBA1C 6.8 (H) 11/11/2022   Lab Results  Component Value Date   VITAMINB12 331 06/12/2020   Lab Results  Component Value Date   TSH 2.750 04/30/2020        No data to display               No data to display           ASSESSMENT AND PLAN  72 y.o. year old male  has a past medical history of Diabetes mellitus without complication (HCC), GERD (gastroesophageal reflux disease), Hyperlipidemia, Hypertension, Psoriasis, and Sleep apnea. here with   OSA (obstructive sleep apnea) - Plan: Ambulatory referral to Neurology, For home use only DME continuous positive airway pressure (CPAP)  Chronic tension-type headache, not intractable - Plan: Ambulatory referral to Neurology  OSA on CPAP - Plan: Ambulatory referral to Neurology, For home use only DME continuous positive airway pressure (CPAP)  Annette Stable is doing well. Compliance report reveals excellent compliance. AHI well managed. Headaches continue but stable. Tinnitus continues but he is not ready to pursue hearing aids. Neurologic workup unremarkable. Referral placed for new neurologist in [place]. He will listen for call to schedule. Healthy lifestyle habits encouraged. He will continue close follow up with PCP. He will follow up with me as needed.     Orders Placed This Encounter  Procedures   For home use only DME continuous positive airway pressure (CPAP)    Supplies    Order Specific Question:   Length of Need    Answer:   Lifetime    Order Specific Question:   Patient has OSA or probable OSA    Answer:   Yes    Order Specific Question:   Is the patient currently using CPAP in the home    Answer:   Yes    Order Specific Question:   Settings    Answer:   Other see comments    Order Specific Question:   CPAP supplies needed    Answer:   Mask, headgear, cushions, filters, heated tubing and  water chamber   Ambulatory referral to Neurology    Referral Priority:   Routine    Referral Type:   Consultation    Referral Reason:   Specialty Services Required    Requested Specialty:   Neurology    Number of Visits Requested:   1     No orders of the defined types were placed in  this encounter.    Shawnie Dapper, MSN, FNP-C 04/07/2023, 3:48 PM  [facility] [address], [address] [place], [place] [address] [phone]

## 2023-04-07 NOTE — Patient Instructions (Addendum)
Please continue using your CPAP regularly. While your insurance requires that you use CPAP at least 4 hours each night on 70% of the nights, I recommend, that you not skip any nights and use it throughout the night if you can. Getting used to CPAP and staying with the treatment long term does take time and patience and discipline. Untreated obstructive sleep apnea when it is moderate to severe can have an adverse impact on cardiovascular health and raise her risk for heart disease, arrhythmias, hypertension, congestive heart failure, stroke and diabetes. Untreated obstructive sleep apnea causes sleep disruption, nonrestorative sleep, and sleep deprivation. This can have an impact on your day to day functioning and cause daytime sleepiness and impairment of cognitive function, memory loss, mood disturbance, and problems focussing. Using CPAP regularly can improve these symptoms.  We will update supply orders, today. I will send an referral for a new neurologist in [place]. Good luck with the move!  Follow up as needed

## 2023-04-08 ENCOUNTER — Other Ambulatory Visit: Payer: Self-pay | Admitting: Emergency Medicine

## 2023-04-08 MED ORDER — ORPHENADRINE CITRATE ER 100 MG PO TB12: 100.0000 mg | ORAL_TABLET | Freq: Every evening | ORAL | 1 refills | Status: AC | PRN

## 2023-04-08 NOTE — Telephone Encounter (Signed)
New prescription for Norflex sent to pharmacy of record today.  Thanks.

## 2023-04-13 ENCOUNTER — Telehealth: Payer: Self-pay | Admitting: Family Medicine

## 2023-04-13 ENCOUNTER — Other Ambulatory Visit (HOSPITAL_COMMUNITY): Payer: Self-pay

## 2023-04-13 NOTE — Telephone Encounter (Signed)
Referral for neurology fax to [facility]. Phone: [phone], Fax: [phone]

## 2023-04-27 NOTE — Telephone Encounter (Addendum)
Received a message by fax from [facility] requesting imaging disc of CT and MRI.  I requested imaging from Medical Records. Receive imaging CD from Medical Records. Contacted pt, [facility] need imaging CD for scheduling and review. I would have Imaging CD at front desk for pick up. Pt verbalized understand, would pick up this week.

## 2023-05-13 ENCOUNTER — Ambulatory Visit: Payer: Medicare Other | Admitting: Emergency Medicine

## 2023-05-24 ENCOUNTER — Telehealth: Payer: Self-pay | Admitting: Emergency Medicine

## 2023-05-24 NOTE — Telephone Encounter (Signed)
Made error.

## 2023-05-31 ENCOUNTER — Other Ambulatory Visit: Payer: Self-pay | Admitting: Emergency Medicine

## 2023-05-31 DIAGNOSIS — E1165 Type 2 diabetes mellitus with hyperglycemia: Secondary | ICD-10-CM

## 2023-06-02 MED ORDER — METFORMIN HCL ER 500 MG PO TB24: 500.0000 mg | ORAL_TABLET | Freq: Every day | ORAL | 0 refills | Status: DC

## 2023-06-28 ENCOUNTER — Ambulatory Visit (INDEPENDENT_AMBULATORY_CARE_PROVIDER_SITE_OTHER): Payer: Medicare Other

## 2023-06-28 VITALS — Ht 66.0 in | Wt 190.0 lb

## 2023-06-28 DIAGNOSIS — Z0000 Encounter for general adult medical examination without abnormal findings: Secondary | ICD-10-CM

## 2023-06-28 NOTE — Patient Instructions (Signed)
Aaron [name] , Thank you for taking time to come for your Medicare Wellness Visit. I appreciate your ongoing commitment to your health goals. Please review the following plan we discussed and let me know if I can assist you in the future.   Referrals/Orders/Follow-Ups/Clinician Recommendations: Keep up the good work.    This is a list of the screening recommended for you and due dates:  Health Maintenance  Topic Date Due   COVID-19 Vaccine (4 - 2023-24 season) 05/02/2023   Hemoglobin A1C  05/14/2023   Yearly kidney health urinalysis for diabetes  11/11/2023   Complete foot exam   11/11/2023   Yearly kidney function blood test for diabetes  11/16/2023   Eye exam for diabetics  02/15/2024   Medicare Annual Wellness Visit  06/27/2024   Colon Cancer Screening  10/01/2025   DTaP/Tdap/Td vaccine (4 - Td or Tdap) 12/16/2032   Pneumonia Vaccine  Completed   Flu Shot  Completed   Hepatitis C Screening  Completed   Zoster (Shingles) Vaccine  Completed   HPV Vaccine  Aged Out    Advanced directives: (Copy Requested) Please bring a copy of your health care power of attorney and living will to the office to be added to your chart at your convenience.  Next Medicare Annual Wellness Visit scheduled for next year: No

## 2023-06-28 NOTE — Progress Notes (Signed)
Subjective:   Aaron [name] is a 72 y.o. male who presents for Medicare Annual/Subsequent preventive examination.  Visit Complete: Virtual I connected with  Jamison Oka on 06/28/23 by a audio enabled telemedicine application and verified that I am speaking with the correct person using two identifiers.  Patient Location: Home  Provider Location: Office/Clinic  I discussed the limitations of evaluation and management by telemedicine. The patient expressed understanding and agreed to proceed.  Vital Signs: Because this visit was a virtual/telehealth visit, some criteria may be missing or patient reported. Any vitals not documented were not able to be obtained and vitals that have been documented are patient reported.   Cardiac Risk Factors include: advanced age (>28men, >71 women);hypertension;dyslipidemia;diabetes mellitus;Other (see comment), Risk factor comments: OSA, Aortic atherosclerosis     Objective:    Today's Vitals   06/28/23 0946 06/28/23 0948  Weight: 190 lb (86.2 kg)   Height: 5\' 6"  (1.676 m)   PainSc:  6    Body mass index is 30.67 kg/m.     06/28/2023    9:54 AM 06/24/2022    9:29 AM 04/03/2021    4:23 PM 10/25/2019   11:07 AM  Advanced Directives  Does Patient Have a Medical Advance Directive? Yes No Yes No  Type of Estate agent of [place];Living will  Living will;Healthcare Power of Attorney   Does patient want to make changes to medical advance directive?   No - Patient declined   Would patient like information on creating a medical advance directive? No - Patient declined No - Patient declined  Yes (ED - Information included in AVS)    Current Medications (verified) Outpatient Encounter Medications as of 06/28/2023  Medication Sig   metFORMIN (GLUCOPHAGE-XR) 500 MG 24 hr tablet Take 1 tablet (500 mg total) by mouth daily.   Multiple Vitamin (MULTI-VITAMIN PO) Take by mouth daily.   Omega-3 Fatty Acids (FISH OIL PO) Take  800 mg by mouth daily.   orphenadrine (NORFLEX) 100 MG tablet Take 1 tablet (100 mg total) by mouth at bedtime as needed for muscle spasms.   rosuvastatin (CRESTOR) 10 MG tablet Take 1 tablet (10 mg total) by mouth daily.   No facility-administered encounter medications on file as of 06/28/2023.    Allergies (verified) Erythromycin and Levaquin [levofloxacin in d5w]   History: Past Medical History:  Diagnosis Date   Diabetes mellitus without complication (HCC)    GERD (gastroesophageal reflux disease)    Hyperlipidemia    Hypertension    Psoriasis    Sleep apnea    CPAP   Past Surgical History:  Procedure Laterality Date   TONSILLECTOMY AND ADENOIDECTOMY     When he was 72 years old   Family History  Problem Relation Age of Onset   Cancer Mother        breast ca spread to bone   Cancer Father 102       brain cancer   Migraines Neg Hx    Headache Neg Hx    Social History   Socioeconomic History   Marital status: Married    Spouse name: Raynelle Fanning   Number of children: 0   Years of education: Not on file   Highest education level: High school graduate  Occupational History   Occupation: Airline pilot, retired   Tobacco Use   Smoking status: Former    Current packs/day: 0.00    Types: Cigarettes    Quit date: 1985    Years since quitting:  39.8   Smokeless tobacco: Never  Vaping Use   Vaping status: Never Used  Substance and Sexual Activity   Alcohol use: Not Currently    Alcohol/week: 7.0 standard drinks of alcohol    Types: 7 Cans of beer per week   Drug use: No   Sexual activity: Yes  Other Topics Concern   Not on file  Social History Narrative   Marital status:  Married.          Children:   none      Lives:      Employment:  Airline pilot      Tobacco:  none      Alcohol:  About 7 drinks per week      Drugs:   none      Exercise:      Seatbelt:      Education: [employer].      Lives at home with wife   Right handed   Caffeine: 2 cups/day   Social Determinants  of Health   Financial Resource Strain: Low Risk  (06/28/2023)   Overall Financial Resource Strain (CARDIA)    Difficulty of Paying Living Expenses: Not hard at all  Food Insecurity: No Food Insecurity (06/28/2023)   Hunger Vital Sign    Worried About Running Out of Food in the Last Year: Never true    Ran Out of Food in the Last Year: Never true  Transportation Needs: No Transportation Needs (06/28/2023)   PRAPARE - Administrator, Civil Service (Medical): No    Lack of Transportation (Non-Medical): No  Physical Activity: Sufficiently Active (06/28/2023)   Exercise Vital Sign    Days of Exercise per Week: 5 days    Minutes of Exercise per Session: 40 min  Stress: No Stress Concern Present (06/28/2023)   [employer] of Occupational Health - Occupational Stress Questionnaire    Feeling of Stress : Only a little  Social Connections: Socially Isolated (06/28/2023)   Social Connection and Isolation Panel [NHANES]    Frequency of Communication with Friends and Family: Once a week    Frequency of Social Gatherings with Friends and Family: Once a week    Attends Religious Services: Never    Database administrator or Organizations: No    Attends Engineer, structural: Never    Marital Status: Married    Tobacco Counseling Counseling given: Not Answered   Clinical Intake:  Pre-visit preparation completed: Yes  Pain : 0-10 Pain Score: 6  Pain Type: Acute pain Pain Location:  (Rt side) Pain Descriptors / Indicators: Aching, Discomfort Pain Onset: More than a month ago Pain Frequency: Intermittent Pain Relieving Factors: Can tolerate the pain Effect of Pain on Daily Activities: noticed pain starting in the am.  Pain Relieving Factors: Can tolerate the pain  BMI - recorded: 30.67 Nutritional Status: BMI > 30  Obese Nutritional Risks: None Diabetes: Yes CBG done?: Yes (149-per pt) CBG resulted in Enter/ Edit results?: No Did pt. bring in CBG monitor  from home?: No  How often do you need to have someone help you when you read instructions, pamphlets, or other written materials from your doctor or pharmacy?: 1 - Never  Interpreter Needed?: No  Information entered by :: Samira Acero, RMA   Activities of Daily Living    06/28/2023    9:51 AM  In your present state of health, do you have any difficulty performing the following activities:  Hearing? 0  Vision? 0  Difficulty  concentrating or making decisions? 0  Walking or climbing stairs? 0  Dressing or bathing? 0  Doing errands, shopping? 0  Preparing Food and eating ? N  Using the Toilet? N  In the past six months, have you accidently leaked urine? N  Do you have problems with loss of bowel control? N  Managing your Medications? N  Managing your Finances? N  Housekeeping or managing your Housekeeping? N    Patient Care Team: Georgina Quint, MD as PCP - General (Internal Medicine) [place], [facility] as Consulting Physician (Optometry)  Indicate any recent Medical Services you may have received from other than [facility] providers in the past year (date may be approximate).     Assessment:   This is a routine wellness examination for Aaron [name].  Hearing/Vision screen Hearing Screening - Comments:: Denies hearing difficulties   Vision Screening - Comments:: Wears eyeglasses   Goals Addressed   None   Depression Screen    06/28/2023   10:00 AM 04/06/2023   10:04 AM 12/21/2022   10:04 AM 11/11/2022   10:05 AM 06/24/2022    9:42 AM 02/09/2022    1:15 PM 01/13/2022    1:58 PM  PHQ 2/9 Scores  PHQ - 2 Score 1 0 1 3 1 2 2   PHQ- 9 Score 4   11  7 9     Fall Risk    06/28/2023    9:54 AM 04/06/2023   10:04 AM 11/11/2022   10:05 AM 06/24/2022    9:30 AM 02/09/2022    1:15 PM  Fall Risk   Falls in the past year? 0 0 0 0 0  Number falls in past yr: 0 0 0 0   Injury with Fall? 0 0 0 0   Risk for fall due to : No Fall Risks No Fall Risks No Fall Risks    Follow up  Falls prevention discussed;Falls evaluation completed Falls evaluation completed Falls evaluation completed      MEDICARE RISK AT HOME: Medicare Risk at Home Any stairs in or around the home?: No If so, are there any without handrails?: No Home free of loose throw rugs in walkways, pet beds, electrical cords, etc?: Yes Adequate lighting in your home to reduce risk of falls?: Yes Life alert?: No Use of a cane, walker or w/c?: No Grab bars in the bathroom?: No Shower chair or bench in shower?: No Elevated toilet seat or a handicapped toilet?: No  TIMED UP AND GO:  Was the test performed?  No    Cognitive Function:        06/28/2023    9:55 AM 06/24/2022    9:30 AM 10/25/2019   11:07 AM  6CIT Screen  What Year? 0 points 0 points 0 points  What month? 0 points 0 points 0 points  What time? 0 points 0 points 0 points  Count back from 20 0 points 0 points 0 points  Months in reverse 0 points 0 points 0 points  Repeat phrase 2 points 2 points 0 points  Total Score 2 points 2 points 0 points    Immunizations Immunization History  Administered Date(s) Administered   Fluad Quad(high Dose 65+) 05/29/2019, 06/06/2020, 06/12/2022   Influenza,inj,Quad PF,6+ Mos 06/28/2014, 04/29/2015, 07/13/2016, 05/31/2017, 06/11/2018   Influenza-Unspecified 06/28/2021   Moderna SARS-COV2 Booster Vaccination 07/29/2021   Moderna Sars-Covid-2 Vaccination 11/09/2019, 12/12/2019   Pneumococcal Conjugate-13 04/29/2015, 03/25/2018   Pneumococcal Polysaccharide-23 04/06/2014, 05/29/2019   Td 03/30/2016, 12/17/2022   Tdap 12/21/2005  Zoster Recombinant(Shingrix) 04/08/2021, 07/29/2021   Zoster, Live 11/25/2015    TDAP status: Up to date  Flu Vaccine status: Up to date  Pneumococcal vaccine status: Up to date  Covid-19 vaccine status: Information provided on how to obtain vaccines.   Qualifies for Shingles Vaccine? Yes   Zostavax completed Yes   Shingrix Completed?: Yes  Screening  Tests Health Maintenance  Topic Date Due   COVID-19 Vaccine (4 - 2023-24 season) 05/02/2023   HEMOGLOBIN A1C  05/14/2023   Diabetic kidney evaluation - Urine ACR  11/11/2023   FOOT EXAM  11/11/2023   Diabetic kidney evaluation - eGFR measurement  11/16/2023   OPHTHALMOLOGY EXAM  02/15/2024   Medicare Annual Wellness (AWV)  06/27/2024   Colonoscopy  10/01/2025   DTaP/Tdap/Td (4 - Td or Tdap) 12/16/2032   Pneumonia Vaccine 4+ Years old  Completed   INFLUENZA VACCINE  Completed   Hepatitis C Screening  Completed   Zoster Vaccines- Shingrix  Completed   HPV VACCINES  Aged Out    Health Maintenance  Health Maintenance Due  Topic Date Due   COVID-19 Vaccine (4 - 2023-24 season) 05/02/2023   HEMOGLOBIN A1C  05/14/2023    Colorectal cancer screening: Type of screening: Colonoscopy. Completed 10/16/2022. Repeat every 3 years  Lung Cancer Screening: (Low Dose CT Chest recommended if Age 21-80 years, 20 pack-year currently smoking OR have quit w/in 15years.) does not qualify.   Lung Cancer Screening Referral: N/A  Additional Screening:  Hepatitis C Screening: does qualify; Completed 11/13/2015  Vision Screening: Recommended annual ophthalmology exams for early detection of glaucoma and other disorders of the eye. Is the patient up to date with their annual eye exam?  Yes  Who is the provider or what is the name of the office in which the patient attends annual eye exams? [facility] If pt is not established with a provider, would they like to be referred to a provider to establish care? No .   Dental Screening: Recommended annual dental exams for proper oral hygiene  Diabetic Foot Exam: Diabetic Foot Exam: Completed 11/11/2022  Community Resource Referral / Chronic Care Management: CRR required this visit?  No   CCM required this visit?  No     Plan:     I have personally reviewed and noted the following in the patient's chart:   Medical and social history Use  of alcohol, tobacco or illicit drugs  Current medications and supplements including opioid prescriptions. Patient is not currently taking opioid prescriptions. Functional ability and status Nutritional status Physical activity Advanced directives List of other physicians Hospitalizations, surgeries, and ER visits in previous 12 months Vitals Screenings to include cognitive, depression, and falls Referrals and appointments  In addition, I have reviewed and discussed with patient certain preventive protocols, quality metrics, and best practice recommendations. A written personalized care plan for preventive services as well as general preventive health recommendations were provided to patient.     Jazlyn Tippens L Ireoluwa Gorsline, CMA   06/28/2023   After Visit Summary: (MyChart) Due to this being a telephonic visit, the after visit summary with patients personalized plan was offered to patient via MyChart   Nurse Notes: Patient is up to date on his health maintenance.  He stated that he has moved out of the [place] and will be getting his A1C checked with his new PCP tomorrow.  He is going to think about getting the Covid booster.  Patient did not schedule for next year's AWV due to him  moving out of the state.  Patient had no other concerns to address today.

## 2023-07-16 ENCOUNTER — Telehealth: Payer: Self-pay | Admitting: Emergency Medicine

## 2023-07-16 NOTE — Telephone Encounter (Signed)
Patient would like to know if Dr. Alvy Bimler would recommend getting an RSV vaccine. Best callback is [phone].

## 2023-07-18 NOTE — Telephone Encounter (Signed)
Yes

## 2023-07-19 ENCOUNTER — Encounter: Payer: Self-pay | Admitting: Radiology

## 2023-08-16 ENCOUNTER — Encounter: Payer: Self-pay | Admitting: Emergency Medicine

## 2023-08-17 ENCOUNTER — Telehealth: Payer: Self-pay | Admitting: Family Medicine

## 2023-08-17 NOTE — Telephone Encounter (Signed)
[facility] called to get updated phone number for pt. States they are trying to reach him regarding his Cpap machine but number they had is disconnected

## 2023-09-08 ENCOUNTER — Encounter: Payer: Self-pay | Admitting: Emergency Medicine

## 2023-10-04 ENCOUNTER — Encounter: Payer: Self-pay | Admitting: Family Medicine

## 2023-11-15 ENCOUNTER — Other Ambulatory Visit: Payer: Self-pay | Admitting: Emergency Medicine

## 2023-11-15 DIAGNOSIS — E1165 Type 2 diabetes mellitus with hyperglycemia: Secondary | ICD-10-CM

## 2024-05-08 ENCOUNTER — Telehealth: Payer: Self-pay | Admitting: Emergency Medicine

## 2024-05-08 NOTE — Telephone Encounter (Signed)
 Contacted Aaron [name] to schedule their annual wellness visit. Patient declined to schedule AWV at this time.Patient has moved to [place]  a year a go. State she let the office know about this. I didn't see any documentation regarding this.    [facility] Care Guide [facility] AWV TEAM Direct Dial: [phone]
